# Patient Record
Sex: Male | Born: 1945
Health system: Southern US, Community
[De-identification: ages and names within clinical notes are randomized; demographics above are authoritative.]

## PROBLEM LIST (undated history)

## (undated) DIAGNOSIS — F40240 Claustrophobia: Secondary | ICD-10-CM

## (undated) DIAGNOSIS — M1990 Unspecified osteoarthritis, unspecified site: Secondary | ICD-10-CM

## (undated) DIAGNOSIS — K219 Gastro-esophageal reflux disease without esophagitis: Secondary | ICD-10-CM

## (undated) DIAGNOSIS — U071 COVID-19: Secondary | ICD-10-CM

## (undated) DIAGNOSIS — Q159 Congenital malformation of eye, unspecified: Secondary | ICD-10-CM

## (undated) DIAGNOSIS — M810 Age-related osteoporosis without current pathological fracture: Secondary | ICD-10-CM

## (undated) DIAGNOSIS — E559 Vitamin D deficiency, unspecified: Secondary | ICD-10-CM

## (undated) DIAGNOSIS — K5190 Ulcerative colitis, unspecified, without complications: Secondary | ICD-10-CM

## (undated) DIAGNOSIS — I10 Essential (primary) hypertension: Secondary | ICD-10-CM

## (undated) DIAGNOSIS — C189 Malignant neoplasm of colon, unspecified: Secondary | ICD-10-CM

## (undated) DIAGNOSIS — I499 Cardiac arrhythmia, unspecified: Secondary | ICD-10-CM

## (undated) DIAGNOSIS — D126 Benign neoplasm of colon, unspecified: Secondary | ICD-10-CM

## (undated) DIAGNOSIS — H409 Unspecified glaucoma: Secondary | ICD-10-CM

## (undated) DIAGNOSIS — F1910 Other psychoactive substance abuse, uncomplicated: Secondary | ICD-10-CM

## (undated) HISTORY — DX: Vitamin D deficiency, unspecified: E55.9

## (undated) HISTORY — PX: EYE SURGERY: SHX253

## (undated) HISTORY — PX: INTRAOCULAR LENS INSERTION: SHX110

## (undated) HISTORY — DX: Malignant neoplasm of colon, unspecified: C18.9

## (undated) HISTORY — DX: Other psychoactive substance abuse, uncomplicated: F19.10

## (undated) HISTORY — DX: Unspecified glaucoma: H40.9

## (undated) HISTORY — DX: Unspecified osteoarthritis, unspecified site: M19.90

## (undated) HISTORY — DX: Cardiac arrhythmia, unspecified: I49.9

## (undated) HISTORY — DX: Ulcerative colitis, unspecified, without complications: K51.90

## (undated) HISTORY — DX: Benign neoplasm of colon, unspecified: D12.6

## (undated) HISTORY — PX: VENTRAL HERNIA REPAIR: SHX424

## (undated) HISTORY — DX: Essential (primary) hypertension: I10

## (undated) HISTORY — PX: UPPER GASTROINTESTINAL ENDOSCOPY: SHX188

## (undated) HISTORY — PX: COLONOSCOPY: SHX174

## (undated) HISTORY — DX: Age-related osteoporosis without current pathological fracture: M81.0

---

## 2003-06-14 HISTORY — PX: HERNIA REPAIR: SHX51

## 2013-03-05 ENCOUNTER — Ambulatory Visit (HOSPITAL_COMMUNITY)
Admission: RE | Admit: 2013-03-05 | Discharge: 2013-03-05 | Disposition: A | Discharge status: Home / Self Care | Payer: Medicaid Other | Source: Ambulatory Visit | Attending: Internal Medicine | Admitting: Internal Medicine

## 2013-03-05 ENCOUNTER — Ambulatory Visit: Payer: Medicaid Other | Attending: Internal Medicine | Admitting: Internal Medicine

## 2013-03-05 ENCOUNTER — Encounter: Payer: Self-pay | Admitting: Internal Medicine

## 2013-03-05 VITALS — BP 136/85 | HR 68 | Temp 98.5°F | Resp 16 | Wt 209.0 lb

## 2013-03-05 DIAGNOSIS — K439 Ventral hernia without obstruction or gangrene: Secondary | ICD-10-CM

## 2013-03-05 DIAGNOSIS — IMO0002 Reserved for concepts with insufficient information to code with codable children: Secondary | ICD-10-CM | POA: Insufficient documentation

## 2013-03-05 DIAGNOSIS — M25561 Pain in right knee: Secondary | ICD-10-CM | POA: Insufficient documentation

## 2013-03-05 DIAGNOSIS — M25469 Effusion, unspecified knee: Secondary | ICD-10-CM | POA: Insufficient documentation

## 2013-03-05 DIAGNOSIS — M25569 Pain in unspecified knee: Secondary | ICD-10-CM | POA: Insufficient documentation

## 2013-03-05 DIAGNOSIS — M1710 Unilateral primary osteoarthritis, unspecified knee: Secondary | ICD-10-CM | POA: Insufficient documentation

## 2013-03-05 LAB — LIPID PANEL
Cholesterol: 186 mg/dL (ref 0–200)
HDL: 44 mg/dL
LDL Cholesterol: 114 mg/dL — ABNORMAL HIGH (ref 0–99)
Total CHOL/HDL Ratio: 4.2 ratio
Triglycerides: 142 mg/dL
VLDL: 28 mg/dL (ref 0–40)

## 2013-03-05 LAB — COMPREHENSIVE METABOLIC PANEL
ALT: 18 U/L (ref 0–53)
AST: 16 U/L (ref 0–37)
Albumin: 3.6 g/dL (ref 3.5–5.2)
Alkaline Phosphatase: 73 U/L (ref 39–117)
Calcium: 9.4 mg/dL (ref 8.4–10.5)
Chloride: 103 mEq/L (ref 96–112)
Glucose, Bld: 101 mg/dL — ABNORMAL HIGH (ref 70–99)
Potassium: 3.5 mEq/L (ref 3.5–5.3)
Sodium: 141 mEq/L (ref 135–145)
Total Protein: 7.1 g/dL (ref 6.0–8.3)

## 2013-03-05 LAB — CBC WITH DIFFERENTIAL/PLATELET
Basophils Absolute: 0 10*3/uL (ref 0.0–0.1)
Basophils Relative: 1 % (ref 0–1)
Eosinophils Absolute: 0.2 10*3/uL (ref 0.0–0.7)
Eosinophils Relative: 3 % (ref 0–5)
HCT: 43 % (ref 39.0–52.0)
Hemoglobin: 14.4 g/dL (ref 13.0–17.0)
Lymphocytes Relative: 40 % (ref 12–46)
Lymphs Abs: 2.1 10*3/uL (ref 0.7–4.0)
MCH: 27.8 pg (ref 26.0–34.0)
MCHC: 33.5 g/dL (ref 30.0–36.0)
MCV: 83 fL (ref 78.0–100.0)
Monocytes Absolute: 0.4 10*3/uL (ref 0.1–1.0)
Monocytes Relative: 7 % (ref 3–12)
Neutro Abs: 2.5 10*3/uL (ref 1.7–7.7)
Neutrophils Relative %: 49 % (ref 43–77)
Platelets: 317 10*3/uL (ref 150–400)
RBC: 5.18 MIL/uL (ref 4.22–5.81)
RDW: 14.8 % (ref 11.5–15.5)
WBC: 5.2 10*3/uL (ref 4.0–10.5)

## 2013-03-05 MED ORDER — NAPROXEN 500 MG PO TABS: 500.0000 mg | ORAL_TABLET | Freq: Two times a day (BID) | ORAL | Status: DC

## 2013-03-05 MED ORDER — TRAMADOL HCL 50 MG PO TABS
50.0000 mg | ORAL_TABLET | Freq: Three times a day (TID) | ORAL | Status: DC | PRN
Start: 2013-03-05 — End: 2013-05-06

## 2013-03-05 NOTE — Progress Notes (Signed)
Patient here to establish care Complains of bilateral knee pain Has gotten worse over time

## 2013-03-05 NOTE — Progress Notes (Signed)
Patient ID: Robert Rhodes, male   DOB: 1946-05-21, 67 y.o.   MRN: [id number] Patient Demographics  Robert Rhodes, is a 67 y.o. male  [id number]  [id number]  DOB - [birth year]  Chief Complaint  Patient presents with  . Knee Pain        Subjective:   Robert Rhodes today is here to establish primary care. Patient has No headache, No chest pain, No abdominal pain - No Nausea, No new weakness tingling or numbness, No Cough - SOB Patient has been doing well, with no significant medical problems. He states that his both knees have been hurting, worse on walking, has been using the cane.    Objective:    Filed Vitals:   03/05/13 0952  BP: 136/85  Pulse: 68  Temp: 98.5 F (36.9 C)  Resp: 16  Weight: 209 lb (94.802 kg)  SpO2: 99%     ALLERGIES:   Allergies  Allergen Reactions  . Advil [Ibuprofen]   . Aspirin   . Tylenol [Acetaminophen]     PAST MEDICAL HISTORY: History reviewed. No pertinent past medical history.  PAST SURGICAL HISTORY: Past Surgical History  Procedure Laterality Date  . Hernia repair      FAMILY HISTORY: History reviewed. No pertinent family history.  MEDICATIONS AT HOME: Prior to Admission medications   Medication Sig Start Date End Date Taking? Authorizing Provider  naproxen (NAPROSYN) 500 MG tablet Take 1 tablet (500 mg total) by mouth 2 (two) times daily with a meal. 03/05/13   Bryn Saline Krystal Eaton, MD  traMADol (ULTRAM) 50 MG tablet Take 1 tablet (50 mg total) by mouth every 8 (eight) hours as needed for pain. 03/05/13   Nechuma Boven Krystal Eaton, MD    REVIEW OF SYSTEMS:  Constitutional:   No   Fevers, chills, fatigue.  HEENT:    No headaches, Sore throat,   Cardio-vascular: No chest pain,  Orthopnea, swelling in lower extremities, anasarca, palpitations  GI:  No abdominal pain, nausea, vomiting, diarrhea. Has large ventral hernia, had surgery in 2006, per patient it came right back  Resp: No shortness of breath,  No coughing up of  blood.No cough.No wheezing.  Skin:  no rash or lesions.  GU:  no dysuria, change in color of urine, no urgency or frequency.  No flank pain.  Musculoskeletal:  please see history of present illness  Psych: No change in mood or affect. No depression or anxiety.  No memory loss.   Exam  General appearance :Awake, alert, NAD, Speech Clear. HEENT: Atraumatic and Normocephalic, PERLA Neck: supple, no JVD. No cervical lymphadenopathy.  Chest: clear to auscultation bilaterally, no wheezing, rales or rhonchi CVS: S1 S2 regular, no murmurs.  Abdomen: soft, NBS, NT, ND, no gaurding, rigidity or rebound.Large ventral hernia  Extremities: No cyanosis, clubbing, B/L Lower Ext shows no edema, , No joint effusions  Neurology: Awake alert, and oriented X 3, CN II-XII intact, Non focal Skin:No Rash or lesions Wounds: N/A    Data Review   Basic Metabolic Panel: No results found for this basename: NA, K, CL, CO2, GLUCOSE, BUN, CREATININE, CALCIUM, MG, PHOS,  in the last 168 hours Liver Function Tests: No results found for this basename: AST, ALT, ALKPHOS, BILITOT, PROT, ALBUMIN,  in the last 168 hours  CBC: No results found for this basename: WBC, NEUTROABS, HGB, HCT, MCV, PLT,  in the last 168 hours ------------------------------------------------------------------------------------------------------------------ No results found for this basename: HGBA1C,  in the last 72 hours ------------------------------------------------------------------------------------------------------------------ No results found for this basename:  CHOL, HDL, LDLCALC, TRIG, CHOLHDL, LDLDIRECT,  in the last 72 hours ------------------------------------------------------------------------------------------------------------------ No results found for this basename: TSH, T4TOTAL, FREET3, T3FREE, THYROIDAB,  in the last 72  hours ------------------------------------------------------------------------------------------------------------------ No results found for this basename: VITAMINB12, FOLATE, FERRITIN, TIBC, IRON, RETICCTPCT,  in the last 72 hours  Coagulation profile  No results found for this basename: INR, PROTIME,  in the last 168 hours    Assessment & Plan   Active Problems: Bilateral knee pain: Likely osteoarthritis - Check a vitamin D level, CMET, bilateral knee x-rays - Placed on Naprosyn BID and tramadol PRN   Ventral hernia: Patient had a surgery done in 2006, per patient it came right back up, he does not want any surgical evaluation. Explained to the patient that if at all with any significant pain in the abdomen, to come to the ER to rule out any incarcerated hernia or obstruction.  Health screening - Check CBC, CMET, lipid panel, vitamin D. level, PSA level - He declined a flu shot - States that he did have a screening colonoscopy at the age of 51, which was normal and was told that he needs repeat colonoscopy in 10 years  Recommendations:Follow labs, imaging  Follow-up in 4 weeks   Weda Baumgarner M.D. 03/05/2013, 10:12 AM

## 2013-03-06 LAB — VITAMIN D 25 HYDROXY (VIT D DEFICIENCY, FRACTURES): Vit D, 25-Hydroxy: 19 ng/mL — ABNORMAL LOW (ref 30–89)

## 2013-03-29 ENCOUNTER — Telehealth: Payer: Self-pay | Admitting: Internal Medicine

## 2013-03-29 NOTE — Telephone Encounter (Signed)
Pt says colitis started flaring last month and wondering about effective medications.  Previously was taking a medication he says did not do much to alleviate symptoms.  Pt has follow-up on 04/04/13.

## 2013-04-04 ENCOUNTER — Encounter: Payer: Self-pay | Admitting: Internal Medicine

## 2013-04-04 ENCOUNTER — Ambulatory Visit: Payer: Medicaid Other | Attending: Internal Medicine | Admitting: Internal Medicine

## 2013-04-04 VITALS — BP 154/84 | HR 77 | Temp 98.2°F | Ht 69.0 in | Wt 209.6 lb

## 2013-04-04 DIAGNOSIS — K5190 Ulcerative colitis, unspecified, without complications: Secondary | ICD-10-CM | POA: Insufficient documentation

## 2013-04-04 MED ORDER — SULFASALAZINE 500 MG PO TABS: 500.0000 mg | ORAL_TABLET | Freq: Four times a day (QID) | ORAL | Status: DC

## 2013-04-04 NOTE — Progress Notes (Signed)
Patient ID: Robert Rhodes, male   DOB: 1946-03-24, 67 y.o.   MRN: [id number] Patient Demographics  Robert Rhodes, is a 67 y.o. male  [id number]  [id number]  DOB - 10/18/1945  Chief Complaint  Patient presents with  . Results  . Ulcerative Colitis        Subjective:   Robert Rhodes is a 67 y.o. male here today for a follow up visit. Patient has history of ulcerative colitis and has been on sulfasalazine for about 20 years, he said he relocated from [place] 2 years ago and has run out of this medication, he has been doing well until recently when his colitis started some flaring again. He claims now there is no abdominal pain, but he has started seeing some blood in stool as well as bloating, and he is gassy. He wonders if he can restart sulfasalazine. He claims he knows how to control the gas by using peppermint, he also knows how to control the cramp with fibers, but his level to control the blood in stool. Patient stopped smoking 20 years ago Patient has No headache, No chest pain, No abdominal pain - No Nausea, No new weakness tingling or numbness, No Cough - SOB.  ALLERGIES: Allergies  Allergen Reactions  . Advil [Ibuprofen]   . Aspirin   . Tylenol [Acetaminophen]     PAST MEDICAL HISTORY: No past medical history on file.  MEDICATIONS AT HOME: Prior to Admission medications   Medication Sig Start Date End Date Taking? Authorizing Provider  naproxen (NAPROSYN) 500 MG tablet Take 1 tablet (500 mg total) by mouth 2 (two) times daily with a meal. 03/05/13  Yes Ripudeep K Rai, MD  traMADol (ULTRAM) 50 MG tablet Take 1 tablet (50 mg total) by mouth every 8 (eight) hours as needed for pain. 03/05/13  Yes Ripudeep Krystal Eaton, MD  sulfaSALAzine (AZULFIDINE) 500 MG tablet Take 1 tablet (500 mg total) by mouth 4 (four) times daily. 04/04/13   Angelica Chessman, MD     Objective:   Filed Vitals:   04/04/13 0955  BP: 154/84  Pulse: 77  Temp: 98.2 F (36.8 C)  TempSrc:  Oral  Height: 5' 9"  (1.753 m)  Weight: 209 lb 9.6 oz (95.074 kg)  SpO2: 99%    Exam General appearance : Awake, alert, not in any distress. Speech Clear. Not toxic looking HEENT: Atraumatic and Normocephalic, pupils equally reactive to light and accomodation Neck: supple, no JVD. No cervical lymphadenopathy.  Chest:Good air entry bilaterally, no added sounds  CVS: S1 S2 regular, no murmurs.  Abdomen: Large ventral hernia, Bowel sounds present, Non tender and not distended with no gaurding, rigidity or rebound. Extremities: B/L Lower Ext shows no edema, both legs are warm to touch Neurology: Awake alert, and oriented X 3, CN II-XII intact, Non focal Skin:No Rash Wounds:N/A   Data Review   CBC No results found for this basename: WBC, HGB, HCT, PLT, MCV, MCH, MCHC, RDW, NEUTRABS, LYMPHSABS, MONOABS, EOSABS, BASOSABS, BANDABS, BANDSABD,  in the last 168 hours  Chemistries   No results found for this basename: NA, K, CL, CO2, GLUCOSE, BUN, CREATININE, GFRCGP, CALCIUM, MG, AST, ALT, ALKPHOS, BILITOT,  in the last 168 hours ------------------------------------------------------------------------------------------------------------------ No results found for this basename: HGBA1C,  in the last 72 hours ------------------------------------------------------------------------------------------------------------------ No results found for this basename: CHOL, HDL, LDLCALC, TRIG, CHOLHDL, LDLDIRECT,  in the last 72 hours ------------------------------------------------------------------------------------------------------------------ No results found for this basename: TSH, T4TOTAL, FREET3, T3FREE, THYROIDAB,  in the last 72 hours ------------------------------------------------------------------------------------------------------------------  No results found for this basename: VITAMINB12, FOLATE, FERRITIN, TIBC, IRON, RETICCTPCT,  in the last 72 hours  Coagulation profile  No results  found for this basename: INR, PROTIME,  in the last 168 hours    Assessment & Plan   Patient Active Problem List   Diagnosis Date Noted  . Ulcerative colitis, unspecified 04/04/2013  . Ventral hernia 03/05/2013  . Knee pain, bilateral 03/05/2013     Plan: Sulfasalazine 500 mg tablet by mouth 4 times a day Ambulatory referral to GI specialist today for colonoscopy and further workup of ulcerative colitis Patient has been extensively counseled about nutrition and exercise   Health Maintenance -Colonoscopy: Ordered today  -Vaccinations:  -Influenza declined by patient  Follow up in 3 months when necessary  The patient was given clear instructions to go to ER or return to medical center if symptoms don't improve, worsen or new problems develop. The patient verbalized understanding. The patient was told to call to get lab results if they haven't heard anything in the next week.    Angelica Chessman, MD, [place], [place], [place] and [place], [place]   04/04/2013, 10:31 AM

## 2013-04-04 NOTE — Patient Instructions (Signed)
Ulcerative Colitis Ulcerative colitis is a long lasting swelling and soreness (inflammation) of the colon (large intestine). In patients with ulcerative colitis, sores (ulcers) and inflammation of the inner lining of the colon lead to illness. Ulcerative colitis can also cause problems outside the digestive tract.  Ulcerative colitis is closely related to another condition of inflammation of the intestines called Crohn's disease. Together, they are frequently referred to as inflammatory bowel disease (IBD). Ulcerative colitis and Crohn's diseases are conditions that can last years to decades. Men and women are affected equally. They most commonly begin during adolescence and early adulthood. SYMPTOMS  Common symptoms of ulcerative colitis include rectal bleeding and diarrhea. There is a wide range of symptoms among patients with this disease depending on how severe the disease is. Some of these symptoms are:  Abdominal pain or cramping.  Diarrhea.  Fever.  Tiredness (fatigue).  Weight loss.  Night sweats.  Rectal pain.  Feeling the immediate need to have a bowel movement (rectal urgency). CAUSES  Ulcerative colitis is caused by increased activity of the immune system in the intestines. The immune system is the system that protects the body against disease such as harmful bacteria, viruses, fungi, and other foreign invaders. When the immune system overacts, it causes inflammation. The cause of the increased immune system activity is not known. This over activity causes long-lasting inflammation and ulceration. This condition may be passed down from your parents (inherited). Brothers, sisters, children, and parents of patients with IBD are more likely to develop these diseases. It is not contagious. This means you cannot catch it from someone else. DIAGNOSIS  Your caregiver may suspect ulcerative colitis based on your symptoms and exam. Blood tests may confirm that there is a problem. You may  be asked to submit a stool specimen for examination. X-rays and CT scans may be necessary. Ultimately, the diagnosis is usually made after a flexible tube is inserted via your anus and your colon is examined under sedation (colonoscopy). With this test, the specialist can take a tiny tissue sample from inside the bowel (biopsy). Examination of this biopsy tissue under a microscopy can reveal ulcerative colitis as the cause of your symptoms. TREATMENT   There is no cure for ulcerative colitis.  Complications such as massive bleeding from the colon (hemorrhage), development of a hole in the colon (perforation), or the development of precancerous or cancerous changes of the colon may require surgery.  Medications are often used to decrease inflammation and control the immune system. These include medicines related to aspirin, steroid medications, and newer and stronger medications to slow down the immune system. Some medications may be used as suppositories or enemas. A number of other medications are used or have been studied. Your caregiver will make specific recommendations. HOME CARE INSTRUCTIONS   There is no cure for ulcerative colitis disease. The best treatment is frequent checkups with your caregiver. Periodic reevaluation is important.  Symptoms such as diarrhea can be controlled with medications. Avoid foods that have a laxative effect such fresh fruit and vegetables and dairy products. During flare ups, you can rest your bowel by staying away from solid foods. Drink clear liquids frequently during the day. Electrolyte or rehydrating fluids are best. Your caregiver can help you with suggestions. Drink often to prevent dehydration. When diarrhea has cleared, eat smaller meals and more often. Avoid food additives and stimulants such as caffeine (coffee, tea, many sodas, or chocolate). Avoid dairy products. Enzyme supplements may help if you develop intolerance to  a sugar in dairy products  (lactose). Ask your caregiver or dietitian about specific dietary instructions.  If you had surgery, be sure you understand your care instructions thoroughly, including proper care of any surgical wounds.  Take any medications exactly as prescribed.  Try to maintain a positive attitude. Learn relaxation techniques such as self hypnosis, mental imaging, and muscle relaxation. If possible, avoid stresses that aggravate your condition. Exercise regularly. Follow your diet. Always get plenty of rest. SEEK MEDICAL CARE IF:   Your symptoms fail to improve after a week or two of new treatment.  You experience continued weight loss.  You have ongoing crampy digestion or loose bowels.  You develop a new skin rash, skin sores, or eye problems. SEEK IMMEDIATE MEDICAL CARE IF:   You have worsening of your symptoms or develop new symptoms.  You have an oral temperature above 102 F (38.9 C), not controlled by medicine.  You develop bloody diarrhea.  You have severe abdominal pain. Document Released: 03/09/2005 Document Revised: 08/22/2011 Document Reviewed: 02/06/2007 [facility] Patient Information 2014 [place], [place].

## 2013-05-06 ENCOUNTER — Encounter: Payer: Self-pay | Admitting: Internal Medicine

## 2013-05-06 ENCOUNTER — Ambulatory Visit: Payer: Medicaid Other | Attending: Internal Medicine | Admitting: Internal Medicine

## 2013-05-06 VITALS — BP 121/73 | HR 85 | Temp 98.9°F | Resp 16 | Ht 69.0 in | Wt 216.0 lb

## 2013-05-06 DIAGNOSIS — M25561 Pain in right knee: Secondary | ICD-10-CM

## 2013-05-06 DIAGNOSIS — E569 Vitamin deficiency, unspecified: Secondary | ICD-10-CM | POA: Insufficient documentation

## 2013-05-06 DIAGNOSIS — M25569 Pain in unspecified knee: Secondary | ICD-10-CM

## 2013-05-06 DIAGNOSIS — K5190 Ulcerative colitis, unspecified, without complications: Secondary | ICD-10-CM

## 2013-05-06 DIAGNOSIS — E559 Vitamin D deficiency, unspecified: Secondary | ICD-10-CM

## 2013-05-06 MED ORDER — TRAMADOL HCL 50 MG PO TABS: 50.0000 mg | ORAL_TABLET | Freq: Three times a day (TID) | ORAL | Status: DC | PRN

## 2013-05-06 MED ORDER — SULFASALAZINE 500 MG PO TABS: 500.0000 mg | ORAL_TABLET | Freq: Four times a day (QID) | ORAL | Status: DC

## 2013-05-06 MED ORDER — VITAMIN D (ERGOCALCIFEROL) 1.25 MG (50000 UNIT) PO CAPS: 50000.0000 [IU] | ORAL_CAPSULE | ORAL | Status: DC

## 2013-05-06 NOTE — Progress Notes (Signed)
Patient ID: Robert Rhodes, male   DOB: 07-05-1945, 67 y.o.   MRN: [id number] Patient Demographics  Robert Rhodes, is a 67 y.o. male  [id number]  [id number]  DOB - 1946/01/28  Chief Complaint  Patient presents with  . Follow-up        Subjective:   Robert Rhodes is a 67 y.o. male here today for a follow up visit. Patient is known to have ulcerative colitis on sulfasalazine. He has no major complaint today, he says his knee pain is better. No bloody stool, no abdominal pain. He is yet to see the gastroenterologist. He is here for the referral. Patient has No headache, No chest pain, No abdominal pain - No Nausea, No new weakness tingling or numbness, No Cough - SOB.  ALLERGIES: Allergies  Allergen Reactions  . Advil [Ibuprofen]   . Aspirin   . Tylenol [Acetaminophen]     PAST MEDICAL HISTORY: History reviewed. No pertinent past medical history.  MEDICATIONS AT HOME: Prior to Admission medications   Medication Sig Start Date End Date Taking? Authorizing Provider  naproxen (NAPROSYN) 500 MG tablet Take 1 tablet (500 mg total) by mouth 2 (two) times daily with a meal. 03/05/13  Yes Ripudeep K Rai, MD  sulfaSALAzine (AZULFIDINE) 500 MG tablet Take 1 tablet (500 mg total) by mouth 4 (four) times daily. 05/06/13  Yes Angelica Chessman, MD  traMADol (ULTRAM) 50 MG tablet Take 1 tablet (50 mg total) by mouth every 8 (eight) hours as needed. 05/06/13  Yes Angelica Chessman, MD  Vitamin D, Ergocalciferol, (DRISDOL) 50000 UNITS CAPS capsule Take 1 capsule (50,000 Units total) by mouth every 7 (seven) days. 05/06/13   Angelica Chessman, MD     Objective:   Filed Vitals:   05/06/13 0954  BP: 121/73  Pulse: 85  Temp: 98.9 F (37.2 C)  TempSrc: Oral  Resp: 16  Height: 5' 9"  (1.753 m)  Weight: 216 lb (97.977 kg)  SpO2: 99%    Exam General appearance : Awake, alert, not in any distress. Speech Clear. Not toxic looking HEENT: Atraumatic and Normocephalic, pupils equally  reactive to light and accomodation Neck: supple, no JVD. No cervical lymphadenopathy.  Chest:Good air entry bilaterally, no added sounds  CVS: S1 S2 regular, no murmurs.  Abdomen: Huge umbilical hernia. Bowel sounds present, Non tender and not distended with no gaurding, rigidity or rebound. Extremities: B/L Lower Ext shows no edema, both legs are warm to touch Neurology: Awake alert, and oriented X 3, CN II-XII intact, Non focal Skin:No Rash Wounds:N/A   Data Review   CBC No results found for this basename: WBC, HGB, HCT, PLT, MCV, MCH, MCHC, RDW, NEUTRABS, LYMPHSABS, MONOABS, EOSABS, BASOSABS, BANDABS, BANDSABD,  in the last 168 hours  Chemistries   No results found for this basename: NA, K, CL, CO2, GLUCOSE, BUN, CREATININE, GFRCGP, CALCIUM, MG, AST, ALT, ALKPHOS, BILITOT,  in the last 168 hours ------------------------------------------------------------------------------------------------------------------ No results found for this basename: HGBA1C,  in the last 72 hours ------------------------------------------------------------------------------------------------------------------ No results found for this basename: CHOL, HDL, LDLCALC, TRIG, CHOLHDL, LDLDIRECT,  in the last 72 hours ------------------------------------------------------------------------------------------------------------------ No results found for this basename: TSH, T4TOTAL, FREET3, T3FREE, THYROIDAB,  in the last 72 hours ------------------------------------------------------------------------------------------------------------------ No results found for this basename: VITAMINB12, FOLATE, FERRITIN, TIBC, IRON, RETICCTPCT,  in the last 72 hours  Coagulation profile  No results found for this basename: INR, PROTIME,  in the last 168 hours    Assessment & Plan   1. Ulcerative colitis, unspecified Continue - sulfaSALAzine (  AZULFIDINE) 500 MG tablet; Take 1 tablet (500 mg total) by mouth 4 (four) times  daily.  Dispense: 120 tablet; Refill: 3 - Ambulatory referral to Gastroenterology for colonoscopy  2. Hypovitaminosis D Start - Vitamin D, Ergocalciferol, (DRISDOL) 50000 UNITS CAPS capsule; Take 1 capsule (50,000 Units total) by mouth every 7 (seven) days.  Dispense: 8 capsule; Refill: 1  3. Knee pain, bilateral  - traMADol (ULTRAM) 50 MG tablet; Take 1 tablet (50 mg total) by mouth every 8 (eight) hours as needed.  Dispense: 60 tablet; Refill: 1  Follow up in 3 months or when necessary  The patient was given clear instructions to go to ER or return to medical center if symptoms don't improve, worsen or new problems develop. The patient verbalized understanding. The patient was told to call to get lab results if they haven't heard anything in the next week.    Angelica Chessman, MD, [place], [place], [place] and [place], [place]   05/06/2013, 10:38 AM

## 2013-05-06 NOTE — Progress Notes (Signed)
Pt is here following up on his knee pain/osteoarthritis. The medication is adequately controlling the knee pain.

## 2013-05-06 NOTE — Patient Instructions (Signed)
Ulcerative Colitis Ulcerative colitis is a long lasting swelling and soreness (inflammation) of the colon (large intestine). In patients with ulcerative colitis, sores (ulcers) and inflammation of the inner lining of the colon lead to illness. Ulcerative colitis can also cause problems outside the digestive tract.  Ulcerative colitis is closely related to another condition of inflammation of the intestines called Crohn's disease. Together, they are frequently referred to as inflammatory bowel disease (IBD). Ulcerative colitis and Crohn's diseases are conditions that can last years to decades. Men and women are affected equally. They most commonly begin during adolescence and early adulthood. SYMPTOMS  Common symptoms of ulcerative colitis include rectal bleeding and diarrhea. There is a wide range of symptoms among patients with this disease depending on how severe the disease is. Some of these symptoms are:  Abdominal pain or cramping.  Diarrhea.  Fever.  Tiredness (fatigue).  Weight loss.  Night sweats.  Rectal pain.  Feeling the immediate need to have a bowel movement (rectal urgency). CAUSES  Ulcerative colitis is caused by increased activity of the immune system in the intestines. The immune system is the system that protects the body against disease such as harmful bacteria, viruses, fungi, and other foreign invaders. When the immune system overacts, it causes inflammation. The cause of the increased immune system activity is not known. This over activity causes long-lasting inflammation and ulceration. This condition may be passed down from your parents (inherited). Brothers, sisters, children, and parents of patients with IBD are more likely to develop these diseases. It is not contagious. This means you cannot catch it from someone else. DIAGNOSIS  Your caregiver may suspect ulcerative colitis based on your symptoms and exam. Blood tests may confirm that there is a problem. You may  be asked to submit a stool specimen for examination. X-rays and CT scans may be necessary. Ultimately, the diagnosis is usually made after a flexible tube is inserted via your anus and your colon is examined under sedation (colonoscopy). With this test, the specialist can take a tiny tissue sample from inside the bowel (biopsy). Examination of this biopsy tissue under a microscopy can reveal ulcerative colitis as the cause of your symptoms. TREATMENT   There is no cure for ulcerative colitis.  Complications such as massive bleeding from the colon (hemorrhage), development of a hole in the colon (perforation), or the development of precancerous or cancerous changes of the colon may require surgery.  Medications are often used to decrease inflammation and control the immune system. These include medicines related to aspirin, steroid medications, and newer and stronger medications to slow down the immune system. Some medications may be used as suppositories or enemas. A number of other medications are used or have been studied. Your caregiver will make specific recommendations. HOME CARE INSTRUCTIONS   There is no cure for ulcerative colitis disease. The best treatment is frequent checkups with your caregiver. Periodic reevaluation is important.  Symptoms such as diarrhea can be controlled with medications. Avoid foods that have a laxative effect such fresh fruit and vegetables and dairy products. During flare ups, you can rest your bowel by staying away from solid foods. Drink clear liquids frequently during the day. Electrolyte or rehydrating fluids are best. Your caregiver can help you with suggestions. Drink often to prevent dehydration. When diarrhea has cleared, eat smaller meals and more often. Avoid food additives and stimulants such as caffeine (coffee, tea, many sodas, or chocolate). Avoid dairy products. Enzyme supplements may help if you develop intolerance to  a sugar in dairy products  (lactose). Ask your caregiver or dietitian about specific dietary instructions.  If you had surgery, be sure you understand your care instructions thoroughly, including proper care of any surgical wounds.  Take any medications exactly as prescribed.  Try to maintain a positive attitude. Learn relaxation techniques such as self hypnosis, mental imaging, and muscle relaxation. If possible, avoid stresses that aggravate your condition. Exercise regularly. Follow your diet. Always get plenty of rest. SEEK MEDICAL CARE IF:   Your symptoms fail to improve after a week or two of new treatment.  You experience continued weight loss.  You have ongoing crampy digestion or loose bowels.  You develop a new skin rash, skin sores, or eye problems. SEEK IMMEDIATE MEDICAL CARE IF:   You have worsening of your symptoms or develop new symptoms.  You have an oral temperature above 102 F (38.9 C), not controlled by medicine.  You develop bloody diarrhea.  You have severe abdominal pain. Document Released: 03/09/2005 Document Revised: 08/22/2011 Document Reviewed: 02/06/2007 [facility] Patient Information 2014 [place], [place].

## 2013-05-21 ENCOUNTER — Encounter: Payer: Self-pay | Admitting: Internal Medicine

## 2013-06-13 DIAGNOSIS — C189 Malignant neoplasm of colon, unspecified: Secondary | ICD-10-CM

## 2013-06-13 HISTORY — DX: Malignant neoplasm of colon, unspecified: C18.9

## 2013-06-20 ENCOUNTER — Ambulatory Visit (INDEPENDENT_AMBULATORY_CARE_PROVIDER_SITE_OTHER): Payer: Medicare FFS | Admitting: Internal Medicine

## 2013-06-20 ENCOUNTER — Encounter: Payer: Self-pay | Admitting: Internal Medicine

## 2013-06-20 VITALS — BP 148/84 | HR 80 | Ht 66.14 in | Wt 222.5 lb

## 2013-06-20 DIAGNOSIS — K5190 Ulcerative colitis, unspecified, without complications: Secondary | ICD-10-CM

## 2013-06-20 DIAGNOSIS — K3189 Other diseases of stomach and duodenum: Secondary | ICD-10-CM

## 2013-06-20 DIAGNOSIS — R1013 Epigastric pain: Secondary | ICD-10-CM

## 2013-06-20 DIAGNOSIS — K30 Functional dyspepsia: Secondary | ICD-10-CM

## 2013-06-20 MED ORDER — MOVIPREP 100 G PO SOLR: ORAL | Status: DC

## 2013-06-20 MED ORDER — FAMOTIDINE 20 MG PO TABS: 20.0000 mg | ORAL_TABLET | Freq: Two times a day (BID) | ORAL | Status: DC

## 2013-06-20 NOTE — Patient Instructions (Addendum)
You have been scheduled for a colonoscopy with propofol. Please follow written instructions given to you at your visit today.  Please pick up your prep kit at the pharmacy within the next 1-3 days. If you use inhalers (even only as needed), please bring them with you on the day of your procedure. Your physician has requested that you go to www.startemmi.com and enter the access code given to you at your visit today. This web site gives a general overview about your procedure. However, you should still follow specific instructions given to you by our office regarding your preparation for the procedure.  Continue taking sulfaSALAzine (AZULFIDINE) 500 MG tablet as you are  We also sent in pepcid to your pharmacy take as directed.

## 2013-06-20 NOTE — Progress Notes (Signed)
Patient ID: Robert Rhodes, male   DOB: May 12, 1946, 68 y.o.   MRN: [id number] HPI: Robert Rhodes is a 68 year old male with a past medical history of ulcerative colitis and vitamin D deficiency he was seen in consultation at the request of Dr. Doreene Burke to establish care regarding ulcerative colitis and after a recent flare. He is here alone today. He reports he was diagnosed with ulcerative colitis in approximately 1992. He feels his last colonoscopy was about 10 years ago. His previous GI doctor was in [place].  He has not seen a GI doctor since moving to this area in July 2013. He reports having intermittent flares of his colitis over the last several decades. This occurs approximately every 2-3 years. He reports he developed a flare in October 2014 which lasted about 6-8 weeks. This was slightly longer than his previous flares and he reports he has been able to abort most flares with reinitiation of sulfasalazine. He is currently taking sulfasalazine 500 mg 4 times daily. He reports he usually will start back on the sulfasalazine when his flare symptoms began. Flares for him involved regularity to his bowel movements and eventually loose bloody stools. He reports he has never had much abdominal pain but reports lower abdominal cramping associated with his diarrhea. He does endorse tenesmus. He reports his flare has resolved completely in his bowel habits at this point to return back to normal which is 1-2 formed stools daily. No blood in his stool or melena. He reports a good appetite. He eats a vegetarian diet. He does report occasional indigestion but no dysphagia or diet dysphagia. No weight loss. Early satiety. No rashes, new joint pains, eye complaints or oral ulcers.  No known family history of IBD  He does use raspberry fiber bland and peppermint oil which helps him with constipation and bowel regularity. When he is not having flares of colitis he can become constipated.  I inquired about  previous treatments for active colitis. He does not recall ever being treated with prednisone or other steroids. He also denies a history of colon polyps.  He has a history of the large ventral hernia, status post repair which has failed. He reports some occasional soreness in his large ventral hernia. He has tried an abdominal binder but reports this is too tight it makes it difficult for him to breathe  Past Medical History  Diagnosis Date  . Vitamin D deficiency   . UC (ulcerative colitis)     Past Surgical History  Procedure Laterality Date  . Ventral hernia repair      Current Outpatient Prescriptions  Medication Sig Dispense Refill  . AMBULATORY NON FORMULARY MEDICATION Slippery elm 1 tablet by mouth once daily      . AMBULATORY NON FORMULARY MEDICATION Peppermint Oil One capsule by mouth three times a day as needed      . AMBULATORY NON FORMULARY MEDICATION Raspberry fiber blend Three tsp daily      . AMBULATORY NON FORMULARY MEDICATION Artichoke  Take 1 capsule by mouth daily      . naproxen (NAPROSYN) 500 MG tablet Take 1 tablet (500 mg total) by mouth 2 (two) times daily with a meal.  60 tablet  1  . sulfaSALAzine (AZULFIDINE) 500 MG tablet Take 1 tablet (500 mg total) by mouth 4 (four) times daily.  120 tablet  3  . traMADol (ULTRAM) 50 MG tablet Take 1 tablet (50 mg total) by mouth every 8 (eight) hours as needed.  60 tablet  1  . vitamin E 100 UNIT capsule Take 100 Units by mouth daily.      . famotidine (PEPCID) 20 MG tablet Take 1 tablet (20 mg total) by mouth 2 (two) times daily.  60 tablet  3  . MOVIPREP 100 G SOLR Use per prep instruction  1 kit  0   No current facility-administered medications for this visit.    Allergies  Allergen Reactions  . Advil [Ibuprofen]   . Aspirin   . Tylenol [Acetaminophen]     Family History  Problem Relation Age of Onset  . Cancer Mother     type unknown  . Diabetes Father   . Heart disease Father   . Heart disease  Sister   . Heart attack Brother     History  Substance Use Topics  . Smoking status: Former Smoker    Types: Cigarettes    Quit date: 06/14/2003  . Smokeless tobacco: Never Used     Comment: quit in July 2005  . Alcohol Use: No     Comment: quit in 2005    ROS: As per history of present illness, otherwise negative  BP 148/84  Pulse 80  Ht 5' 6.14" (1.68 m)  Wt 222 lb 8 oz (100.925 kg)  BMI 35.76 kg/m2 Constitutional: Well-developed and well-nourished. No distress. HEENT: Normocephalic and atraumatic. Oropharynx is clear and moist. No oropharyngeal exudate. Conjunctivae are normal.  No scleral icterus. Neck: Neck supple. Trachea midline. Cardiovascular: Normal rate, regular rhythm and intact distal pulses. No M/R/G Pulmonary/chest: Effort normal and breath sounds normal. No wheezing, rales or rhonchi. Abdominal: Soft,  large ventral hernia without tenderness, no other distended. Bowel sounds active throughout.  Extremities: no clubbing, cyanosis, or edema Neurological: Alert and oriented to person place and time. Skin: Skin is warm and dry. No rashes noted. Psychiatric: Normal mood and affect. Behavior is normal.  RELEVANT LABS AND IMAGING: CBC    Component Value Date/Time   WBC 5.2 03/05/2013 1021   RBC 5.18 03/05/2013 1021   HGB 14.4 03/05/2013 1021   HCT 43.0 03/05/2013 1021   PLT 317 03/05/2013 1021   MCV 83.0 03/05/2013 1021   MCH 27.8 03/05/2013 1021   MCHC 33.5 03/05/2013 1021   RDW 14.8 03/05/2013 1021   LYMPHSABS 2.1 03/05/2013 1021   MONOABS 0.4 03/05/2013 1021   EOSABS 0.2 03/05/2013 1021   BASOSABS 0.0 03/05/2013 1021    CMP     Component Value Date/Time   NA 141 03/05/2013 1021   K 3.5 03/05/2013 1021   CL 103 03/05/2013 1021   CO2 33* 03/05/2013 1021   GLUCOSE 101* 03/05/2013 1021   BUN 10 03/05/2013 1021   CREATININE 0.90 03/05/2013 1021   CALCIUM 9.4 03/05/2013 1021   PROT 7.1 03/05/2013 1021   ALBUMIN 3.6 03/05/2013 1021   AST 16 03/05/2013 1021   ALT 18  03/05/2013 1021   ALKPHOS 73 03/05/2013 1021   BILITOT 0.5 03/05/2013 1021    ASSESSMENT/PLAN:  68 year old male with a past medical history of ulcerative colitis and vitamin D deficiency he was seen in consultation at the request of Dr. Doreene Burke to establish care regarding ulcerative colitis and after a recent flare.  1.  Ulcerative colitis -- the patient has what sounds like long-standing ulcerative colitis though I do not have any previous records at this time. The patient cannot remember the name of his exact GI practice but we will try to obtain records if possible. I have recommended repeat colonoscopy  at this time to determine the activity of his colitis and also to perform surveillance biopsies. We discussed current guidelines would support surveillance every one to 2 years with 4 quadrant biopsies every 10 cm. He was certainly meet criteria for surveillance at this time given his colitis of greater than 20 years.  For now I will have him continue sulfasalazine 2 g total daily.  We discussed that this may be a permanent medication for him to try to abort future attacks and maintain remission.  Further recommendations will be able to be made once his colonoscopy has been completed. We discussed colonoscopy including the risks and benefits and he is agreeable to proceed  2.  Indigestion -- mild and occasional without alarm symptoms. I recommended famotidine 20 mg twice daily as needed. He is asked to notify me if this fails to help her symptoms progress in any way. He voices understanding

## 2013-06-25 ENCOUNTER — Ambulatory Visit (AMBULATORY_SURGERY_CENTER): Payer: Commercial Managed Care - HMO | Admitting: Internal Medicine

## 2013-06-25 ENCOUNTER — Encounter: Payer: Self-pay | Admitting: Internal Medicine

## 2013-06-25 ENCOUNTER — Other Ambulatory Visit (INDEPENDENT_AMBULATORY_CARE_PROVIDER_SITE_OTHER): Payer: Medicare FFS

## 2013-06-25 ENCOUNTER — Telehealth: Payer: Self-pay | Admitting: *Deleted

## 2013-06-25 VITALS — BP 156/75 | HR 80 | Resp 16

## 2013-06-25 DIAGNOSIS — K6389 Other specified diseases of intestine: Secondary | ICD-10-CM

## 2013-06-25 DIAGNOSIS — D126 Benign neoplasm of colon, unspecified: Secondary | ICD-10-CM

## 2013-06-25 DIAGNOSIS — K5190 Ulcerative colitis, unspecified, without complications: Secondary | ICD-10-CM

## 2013-06-25 DIAGNOSIS — K639 Disease of intestine, unspecified: Secondary | ICD-10-CM

## 2013-06-25 DIAGNOSIS — C180 Malignant neoplasm of cecum: Secondary | ICD-10-CM

## 2013-06-25 LAB — CREATININE, SERUM: CREATININE: 1 mg/dL (ref 0.4–1.5)

## 2013-06-25 LAB — BUN: BUN: 6 mg/dL (ref 6–23)

## 2013-06-25 MED ORDER — SODIUM CHLORIDE 0.9 % IV SOLN: 500.0000 mL | INTRAVENOUS | Status: DC

## 2013-06-25 NOTE — Patient Instructions (Signed)
YOU HAD AN ENDOSCOPIC PROCEDURE TODAY AT [facility]: Refer to the procedure report that was given to you for any specific questions about what was found during the examination.  If the procedure report does not answer your questions, please call your gastroenterologist to clarify.  If you requested that your care partner not be given the details of your procedure findings, then the procedure report has been included in a sealed envelope for you to review at your convenience later.  YOU SHOULD EXPECT: Some feelings of bloating in the abdomen. Passage of more gas than usual.  Walking can help get rid of the air that was put into your GI tract during the procedure and reduce the bloating. If you had a lower endoscopy (such as a colonoscopy or flexible sigmoidoscopy) you may notice spotting of blood in your stool or on the toilet paper. If you underwent a bowel prep for your procedure, then you may not have a normal bowel movement for a few days.  DIET: Your first meal following the procedure should be a light meal and then it is ok to progress to your normal diet.  A half-sandwich or bowl of soup is an example of a good first meal.  Heavy or fried foods are harder to digest and may make you feel nauseous or bloated.  Likewise meals heavy in dairy and vegetables can cause extra gas to form and this can also increase the bloating.  Drink plenty of fluids but you should avoid alcoholic beverages for 24 hours.  ACTIVITY: Your care partner should take you home directly after the procedure.  You should plan to take it easy, moving slowly for the rest of the day.  You can resume normal activity the day after the procedure however you should NOT DRIVE or use heavy machinery for 24 hours (because of the sedation medicines used during the test).    SYMPTOMS TO REPORT IMMEDIATELY: A gastroenterologist can be reached at any hour.  During normal business hours, 8:30 AM to 5:00 PM Monday through Friday,  call [phone].  After hours and on weekends, please call the GI answering service at [phone] who will take a message and have the physician on call contact you.   Following lower endoscopy (colonoscopy or flexible sigmoidoscopy):  Excessive amounts of blood in the stool  Significant tenderness or worsening of abdominal pains  Swelling of the abdomen that is new, acute  Fever of 100F or higher  FOLLOW UP: If any biopsies were taken you will be contacted by phone or by letter within the next 1-3 weeks.  Call your gastroenterologist if you have not heard about the biopsies in 3 weeks.  Our staff will call the home number listed on your records the next business day following your procedure to check on you and address any questions or concerns that you may have at that time regarding the information given to you following your procedure. This is a courtesy call and so if there is no answer at the home number and we have not heard from you through the emergency physician on call, we will assume that you have returned to your regular daily activities without incident.  SIGNATURES/CONFIDENTIALITY: You and/or your care partner have signed paperwork which will be entered into your electronic medical record.  These signatures attest to the fact that that the information above on your After Visit Summary has been reviewed and is understood.  Full responsibility of the confidentiality of this  discharge information lies with you and/or your care-partner.  Recommendations Await pathology results Hold aspirin, aspirin products, and anti-inflammatory medications for 1 week  CT scan of the abdomen and pelvis Referral to Dr. Leighton Ruff, colorectal surgery

## 2013-06-25 NOTE — Progress Notes (Signed)
Pt stable to RR 

## 2013-06-25 NOTE — Telephone Encounter (Signed)
Scheduled pt for CT Chest/ABD/Pe;vis for 06/28/13. Robert Rhodes in [facility] will give instructions.

## 2013-06-25 NOTE — Op Note (Signed)
[place]  [address] [place], [id number]   COLONOSCOPY PROCEDURE REPORT  PATIENT: Robert Rhodes, Robert Rhodes  MR#: [id number] BIRTHDATE: 11-14-1945 , 54  yrs. old GENDER: Male ENDOSCOPIST: Jerene Bears, MD PROCEDURE DATE:  06/25/2013 PROCEDURE:   Colonoscopy with biopsy, Colonoscopy with snare polypectomy, and Submucosal injection, any substance First Screening Colonoscopy - Avg.  risk and is 50 yrs.  old or older - No.  Prior Negative Screening - Now for repeat screening. N/A  History of Adenoma - Now for follow-up colonoscopy & has been > or = to 3 yrs.  N/A  Polyps Removed Today? Yes. 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 MEDICATIONS: MAC sedation, administered by CRNA and propofol (Diprivan) 439m IV  DESCRIPTION OF PROCEDURE:   After the risks benefits and alternatives of the procedure were thoroughly explained, informed consent was obtained.  A digital rectal exam revealed no rectal mass.   The LB [id number] endoscope was introduced through the anus and advanced to the cecum, which was identified by both the appendix and ileocecal valve. No adverse events experienced. The quality of the prep was Moviprep fair  The instrument was then slowly withdrawn as the colon was fully examined.    COLON FINDINGS: Moderate melanosis was found throughout the entire examined colon.   A firm mass measuring 3 X 4cm in size was found at the ileocecal valve.  Multiple biopsies of the lesion were performed using cold forceps.  A tattoo was applied in the ascending colon, distal to the mass lesion.   Three sessile polyps measuring 3-6 mm in size were found at the splenic flexure, in the descending colon, and sigmoid colon.  Polypectomy was performed using cold snare.  All resections were complete and all polyp tissue was completely retrieved.   The colon mucosa was otherwise normal without evidence of active colitis  or proctitis.  Multiple biopsies were obtained in random fashion from the right colon, left colon and rectum (3 separate jars).  Retroflexed views revealed no abnormalities. The time to cecum=6 minutes 32 seconds.  Withdrawal time=27 minutes 46 seconds.  The scope was withdrawn and the procedure completed.  COMPLICATIONS: There were no complications.  ENDOSCOPIC IMPRESSION: 1.   Moderate melanosis was found throughout the entire examined colon 2.   Mass measuring 3 X 4cm in size were found at the ileocecal valve; multiple biopsies of the lesion were performed; a tattoo was applied 3.   Three sessile polyps measuring 3-6 mm in size were found at the splenic flexure, in the descending colon, and sigmoid colon; Polypectomy was performed using cold snare 4.   The colon mucosa was otherwise normal without evidence of currently active colitis/proctitis; random biopsies from the right colon, left colon, and rectum  RECOMMENDATIONS: 1.  Await pathology results 2.  Hold aspirin, aspirin products, and anti-inflammatory medication for 1 week. 3.  My office will arrange for you to have a CT scan of abdomen and pelvis. 4.  Referral to Dr.  ALeighton Ruff Colorectal Surgery   eSigned:  JJerene Bears MD 06/25/2013 2:30 PM   cc: The Patient and JDoreene BurkeMD, Olugbemiga   PATIENT NAME:  [name], BielaMR#: [id number]

## 2013-06-25 NOTE — Progress Notes (Signed)
Called to room to assist during endoscopic procedure.  Patient ID and intended procedure confirmed with present staff. Received instructions for my participation in the procedure from the performing physician.  

## 2013-06-26 ENCOUNTER — Telehealth: Payer: Self-pay | Admitting: *Deleted

## 2013-06-26 NOTE — Telephone Encounter (Signed)
  Follow up Call-  Call back number 06/25/2013  Post procedure Call Back phone  # [phone]  Permission to leave phone message Yes     Patient questions:  Do you have a fever, pain , or abdominal swelling? no Pain Score  0 *  Have you tolerated food without any problems? yes  Have you been able to return to your normal activities? yes  Do you have any questions about your discharge instructions: Diet   no Medications  no Follow up visit  no  Do you have questions or concerns about your Care? no  Actions: * If pain score is 4 or above: No action needed, pain <4.

## 2013-06-27 ENCOUNTER — Telehealth: Payer: Self-pay | Admitting: *Deleted

## 2013-06-27 DIAGNOSIS — K6389 Other specified diseases of intestine: Secondary | ICD-10-CM

## 2013-06-27 DIAGNOSIS — R933 Abnormal findings on diagnostic imaging of other parts of digestive tract: Secondary | ICD-10-CM

## 2013-06-27 DIAGNOSIS — R918 Other nonspecific abnormal finding of lung field: Secondary | ICD-10-CM

## 2013-06-27 NOTE — Telephone Encounter (Signed)
Informed pt he will need to get a referral for his visit to surgery. He stated understanding.

## 2013-06-27 NOTE — Telephone Encounter (Signed)
Message copied by Lance Morin on Thu Jun 27, 2013  4:40 PM ------      Message from: Aviva Signs      Created: Thu Jun 27, 2013  3:38 PM       Socrates Cahoon,Dr Lindell Noe will have to refer pt over to [facility] due to his [facility].Is this doc in your office?? Thanks Thayer Headings      ----- Message -----         From: Olene Floss, RN         Sent: 06/27/2013   1:32 PM           To: Aviva Signs            Can you give me an appt with Dr Leighton Ruff for mass at ileocecal valve? Thanks. Caren Griffins       ------

## 2013-06-27 NOTE — Telephone Encounter (Signed)
Sent a request to Aviva Signs at [facility] for an appt with Dr Leighton Ruff.

## 2013-06-28 ENCOUNTER — Ambulatory Visit
Admission: RE | Admit: 2013-06-28 | Discharge: 2013-06-28 | Disposition: A | Discharge status: Home / Self Care | Payer: Medicare FFS | Source: Ambulatory Visit | Attending: Internal Medicine | Admitting: Internal Medicine

## 2013-06-28 ENCOUNTER — Ambulatory Visit (INDEPENDENT_AMBULATORY_CARE_PROVIDER_SITE_OTHER)
Admission: RE | Admit: 2013-06-28 | Discharge: 2013-06-28 | Disposition: A | Discharge status: Home / Self Care | Payer: Medicare FFS | Source: Ambulatory Visit | Attending: Internal Medicine | Admitting: Internal Medicine

## 2013-06-28 DIAGNOSIS — K639 Disease of intestine, unspecified: Secondary | ICD-10-CM

## 2013-06-28 DIAGNOSIS — K6389 Other specified diseases of intestine: Secondary | ICD-10-CM

## 2013-06-28 MED ORDER — IOHEXOL 300 MG/ML  SOLN
100.0000 mL | Freq: Once | INTRAMUSCULAR | Status: AC | PRN
  Administered 2013-06-28: 100 mL via INTRAVENOUS

## 2013-06-28 NOTE — Addendum Note (Signed)
Addended by: Allyson Sabal MD, Ascencion Dike on: 06/28/2013 09:55 AM   Modules accepted: Orders

## 2013-07-02 NOTE — Telephone Encounter (Signed)
We are still having problems with pt's referral. The back of the card has not been scanned into EPIC. Aviva Signs at [facility] is trying to give the pt an appt, but needs to see the back of the card. Pt reports he did go to [facility] and ask for a referral. States he gave them the name of the Surgeon and they stated they would do it. After speaking with Thayer Headings, pt will go over to [facility] tomorrow and Thayer Headings will talk to him about his card. She states she had called him several days ago because the name on his card lists Dr Lindell Noe as his PCP. Dr Lindell Noe was not accepting any more pts so he went to [facility] and was seen by Dr Angelica Chessman. 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 Marcello Moores to consider resection He needs a referral to medical oncology Pursuing MRI abdomen to better characterize the liver lesion seen by CT I asked that he call me as questions or concerns arise, he voiced understanding He would like his surgical and oncology appointments to NOT be scheduled on January 30 through February 1 (due to other obligations) He will need a colonoscopy one year after surgical resection    3:50pm Thayer Headings is still working on [employer]. Pt will have his OPEN MRI on 07/04/13. Gave him instructions and directions to [facility]. Referral made to Oncology. Pt stated understanding.

## 2013-07-05 ENCOUNTER — Telehealth: Payer: Self-pay | Admitting: *Deleted

## 2013-07-05 NOTE — Telephone Encounter (Signed)
Spoke with patient by phone and gave appointment for 07/16/13 with Dr. Benay Spice.  He stated he had his MR today.  He stated he knew about his appointment with surgeon on 07/15/13.  Contact name, directions, and phone numbers were provided.

## 2013-07-11 ENCOUNTER — Other Ambulatory Visit: Payer: Medicare FFS

## 2013-07-11 NOTE — Telephone Encounter (Signed)
Pt has a new card with appt with Dr Marcello Moores and the [facility]; he has had his CT and MRI.

## 2013-07-15 ENCOUNTER — Encounter (INDEPENDENT_AMBULATORY_CARE_PROVIDER_SITE_OTHER): Payer: Self-pay | Admitting: General Surgery

## 2013-07-15 ENCOUNTER — Ambulatory Visit (INDEPENDENT_AMBULATORY_CARE_PROVIDER_SITE_OTHER): Payer: Commercial Managed Care - HMO | Admitting: General Surgery

## 2013-07-15 ENCOUNTER — Encounter (INDEPENDENT_AMBULATORY_CARE_PROVIDER_SITE_OTHER): Payer: Self-pay

## 2013-07-15 VITALS — BP 138/82 | HR 78 | Temp 97.8°F | Resp 18 | Ht 69.0 in | Wt 222.5 lb

## 2013-07-15 DIAGNOSIS — C189 Malignant neoplasm of colon, unspecified: Secondary | ICD-10-CM

## 2013-07-15 NOTE — Progress Notes (Signed)
Chief Complaint  Patient presents with  . Colon Cancer    HISTORY: Robert Rhodes is a 68 y.o. male who presents to the office with a cecal mass found on colonoscopy.  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  The remaining colon mucosa was normal without evidence of active inflammation.  Biopsies were negative as well. He does have some minimal microscopic chronic inflammation in his rectum but no evidence of disease on colonoscopy. He states that he has a flare approximately every 2-3 years and uses sulfasalazine for control. He denies any weight loss. He denies any changes in his bowel habits. He denies any nausea or vomiting.  He denies any chest pain or shortness of breath. He states that he can walk up a flight of stairs without getting significantly winded or having chest pain.  Past Medical History  Diagnosis Date  . Vitamin D deficiency   . UC (ulcerative colitis)   . Arthritis   . Osteoporosis       Past Surgical History  Procedure Laterality Date  . Ventral hernia repair        Current Outpatient Prescriptions  Medication Sig Dispense Refill  . AMBULATORY NON FORMULARY MEDICATION Slippery elm 1 tablet by mouth once daily      . AMBULATORY NON FORMULARY MEDICATION Peppermint Oil One capsule by mouth three times a day as needed      . AMBULATORY NON FORMULARY MEDICATION Raspberry fiber blend Three tsp daily      . AMBULATORY NON FORMULARY MEDICATION Artichoke  Take 1 capsule by mouth daily      . famotidine (PEPCID) 20 MG tablet Take 1 tablet (20 mg total) by mouth 2 (two) times daily.  60 tablet  3  . naproxen (NAPROSYN) 500 MG tablet Take 1 tablet (500 mg total) by mouth 2 (two) times daily with a meal.  60 tablet  1  . sulfaSALAzine (AZULFIDINE) 500 MG tablet Take 1 tablet (500 mg total) by mouth 4 (four) times daily.  120 tablet  3  . traMADol (ULTRAM) 50 MG tablet Take 1 tablet (50 mg total) by mouth every 8 (eight) hours as needed.  60 tablet  1  . vitamin E 100  UNIT capsule Take 100 Units by mouth daily.       No current facility-administered medications for this visit.      Allergies  Allergen Reactions  . Advil [Ibuprofen]   . Aspirin   . Tylenol [Acetaminophen]       Family History  Problem Relation Age of Onset  . Cancer Mother     type unknown  . Diabetes Father   . Heart disease Father   . Heart disease Sister   . Heart attack Brother       History   Social History  . Marital Status: Single    Spouse Name: N/A    Number of Children: 0  . Years of Education: N/A   Occupational History  . retired    Social History Main Topics  . Smoking status: Former Smoker    Types: Cigarettes    Quit date: 06/14/2003  . Smokeless tobacco: Never Used     Comment: quit in July 2005  . Alcohol Use: No     Comment: quit in 2005  . Drug Use: No  . Sexual Activity: None   Other Topics Concern  . None   Social History Narrative  . None       REVIEW OF SYSTEMS - PERTINENT POSITIVES  ONLY: Review of Systems - General ROS: negative for - chills or fever Hematological and Lymphatic ROS: negative for - bleeding problems, blood clots or weight loss Respiratory ROS: no cough, shortness of breath, or wheezing Cardiovascular ROS: no chest pain or dyspnea on exertion Gastrointestinal ROS: no abdominal pain, change in bowel habits, or black or bloody stools Genito-Urinary ROS: no dysuria, trouble voiding, or hematuria  EXAM: Filed Vitals:   07/15/13 1414  BP: 138/82  Pulse: 78  Temp: 97.8 F (36.6 C)  Resp: 18    Gen:  No acute distress.  Well nourished and well groomed.   Neurological: Alert and oriented to person, place, and time. Coordination normal.  Head: Normocephalic and atraumatic.  Eyes: Conjunctivae are normal. Pupils are equal, round, and reactive to light. No scleral icterus.  Neck: Normal range of motion. Neck supple. No tracheal deviation or thyromegaly present.  No cervical lymphadenopathy. Cardiovascular: Normal  rate, regular rhythm, normal heart sounds and intact distal pulses.  Exam reveals no gallop and no friction rub.  No murmur heard. Respiratory: Effort normal.  No respiratory distress. No chest wall tenderness. Breath sounds normal.  No wheezes, rales or rhonchi.  GI: Soft. Bowel sounds are normal. The abdomen is soft and nontender.  There is no rebound and no guarding. There is a large upper abdominal hernia. There are no major surgical scars. Musculoskeletal: Normal range of motion. Extremities are nontender.  Skin: Skin is warm and dry. No rash noted. No diaphoresis. No erythema. No pallor. No clubbing, cyanosis, or edema.   Psychiatric: Normal mood and affect. Behavior is normal. Judgment and thought content normal.    RADIOLOGY RESULTS:   Images and reports are reviewed. CT chest abdomen and pelvis IMPRESSION:  1. Cecal mass without local/ regional metastatic disease.  2. Heterogeneous lesion in the periphery of the right hepatic lobe.  While a hemangioma can have this appearance, metastatic disease  cannot be definitively excluded. Additional evaluation with MR  abdomen without and with contrast is recommended, as clinically  indicated.  3. Pathologically enlarged periesophageal lymph nodes, possibly  metastatic.  4. Probable muscular asymmetry in the lateral left supraclavicular  fossa, incompletely imaged. Please correlate with physical exam as a  mass cannot be definitively excluded.  5. Previous ventral hernia repair with recurrent large fat  containing midline ventral hernia.  6. Mild prostate enlargement.     ASSESSMENT AND PLAN: Robert Rhodes is a 68 y.o. M With a long-standing history of Ulcerative colitis.  His most recent colonoscopy shows minimal inflammation but a new cecal cancer. His CT scans are concerning for possible metastatic disease and also showed evidence of a recurrent ventral hernia. The patient states that he has had his MRI but is on a CD disc.  He did  not have that with him today. His CT scans are also concerning for some para-esophageal lymph nodes that are pathologically enlarged.  He has not yet had a CEA level.  We discussed possible surgical resection.  This would depend on the presence of metastatic disease. If there is no obvious metastatic disease, I think it would be reasonable to proceed with a laparoscopic total abdominal colectomy and ileorectal anastomosis, given that he has minimal rectal inflammation.  I do not feel that a total proctocolectomy is necessary in this patient. He will see Dr. Benay Spice tomorrow to discuss his cancer workup. I have instructed him to bring the MRI disc with him to his appointment tomorrow.  Once he has seen  Dr. Benay Spice, I think it is reasonable to discuss his case in our multidisciplinary cancer conference. We will plan on a surgical resection if there are no signs of metastatic disease.    Rosario Adie, MD Colon and Rectal Surgery / [facility]      Visit Diagnoses: 1. Colon cancer     Primary Care Physician: Angelica Chessman, MD

## 2013-07-15 NOTE — Patient Instructions (Addendum)
Colorectal Cancer  Colorectal cancer is the second most common cancer in the [place], striking 140,000 people annually and causing 60,000 deaths. That's a staggering figure when you consider the disease is potentially curable if diagnosed in the early stages. Who is at risk? Though colorectal cancer may occur at any age, more than 90% of the patients are over age 70, at which point the risk doubles every ten years. In addition to age, other high risk factors include a family history of colorectal cancer and polyps and a personal history of ulcerative colitis, colon polyps or cancer of other organs, especially of the breast or uterus. How does it start? It is generally agreed that nearly all colon and rectal cancer begins in benign polyps. These pre-malignant growths occur on the bowel wall and may eventually increase in size and become cancer. Removal of benign polyps is one aspect of preventive medicine that really works! What are the symptoms? The most common symptoms are rectal bleeding and changes in bowel habits, such as constipation or diarrhea. (These symptoms are also common in other diseases so it is important you receive a thorough examination should you experience them.) Abdominal pain and weight loss are usually late symptoms indicating possible extensive disease. Unfortunately, many polyps and early cancers fail to produce symptoms. Therefore, it is important that your routine physical includes colorectal cancer detection procedures once you reach age 70.  There are several methods for detection of colorectal cancer. These include digital rectal examination, a chemical test of the stool for blood, flexible sigmoidoscopy and colonoscopy (lighted tubular instruments used to inspect the lower bowel) and barium enema. Be sure to discuss these options with your surgeon to determine which procedure is best for you. Individuals who have a first-degree relative (parent or sibling) with colon  cancer or polyps should start their colon cancer screening at the age of 70. How is colorectal cancer treated? Colorectal cancer requires surgery in nearly all cases for complete cure. Radiation and chemotherapy are sometimes used in addition to surgery. Between 80-90% are restored to normal health if the cancer is detected and treated in the earliest stages. The cure rate drops to 50% or less when diagnosed in the later stages. Thanks to [employer], less than 5% of all colorectal cancer patients require a colostomy, the surgical construction of an artificial excretory opening from the colon. Can colon cancer be prevented? Colon cancer is very preventable. The most important step towards preventing colon cancer is getting a screening test. Any abnormal screening test should be followed by a colonoscopy. Some individuals prefer to start with colonoscopy as a screening test. Colonoscopy provides a detailed examination of the bowel. Polyps can be identified and can often be removed during colonoscopy. Though not definitely proven, there is some evidence that diet may play a significant role in preventing colorectal cancer. As far as we know, a high fiber, low fat diet is the only dietary measure that might help prevent colorectal cancer. Finally, pay attention to changes in your bowel habits. Any new changes such as persistent constipation, diarrhea, or blood in the stool should be discussed with your physician.   Can hemorrhoids lead to colon cancer? No, but hemorrhoids may produce symptoms similar to colon polyps or cancer. Should you experience these symptoms, you should have them examined and evaluated by a physician, preferably by a colon and rectal surgeon. What is a colon and rectal surgeon? Colon and rectal surgeons are experts in the surgical and non-surgical treatment  of diseases of the colon, rectum and anus. They have completed advanced surgical training in the treatment of these  diseases as well as full general surgical training. Board-certified colon and rectal surgeons complete residencies in general surgery and colon and rectal surgery, and pass intensive examinations conducted by the American Board of Surgery and the American Board of Colon and Rectal Surgery. They are well-versed in the treatment of both benign and malignant diseases of the colon, rectum and anus and are able to perform routine screening examinations and surgically treat conditions if indicated to do so.  2012 American Society of Colon & Rectal Surgeons

## 2013-07-16 ENCOUNTER — Ambulatory Visit (HOSPITAL_BASED_OUTPATIENT_CLINIC_OR_DEPARTMENT_OTHER): Payer: Commercial Managed Care - HMO | Admitting: Oncology

## 2013-07-16 ENCOUNTER — Other Ambulatory Visit (HOSPITAL_COMMUNITY): Payer: Self-pay | Admitting: Diagnostic Radiology

## 2013-07-16 ENCOUNTER — Encounter: Payer: Self-pay | Admitting: Oncology

## 2013-07-16 ENCOUNTER — Ambulatory Visit (HOSPITAL_BASED_OUTPATIENT_CLINIC_OR_DEPARTMENT_OTHER): Payer: Medicare FFS

## 2013-07-16 ENCOUNTER — Ambulatory Visit
Admission: RE | Admit: 2013-07-16 | Discharge: 2013-07-16 | Disposition: A | Discharge status: Home / Self Care | Payer: Self-pay | Source: Ambulatory Visit | Attending: Diagnostic Radiology | Admitting: Diagnostic Radiology

## 2013-07-16 ENCOUNTER — Telehealth: Payer: Self-pay | Admitting: Oncology

## 2013-07-16 ENCOUNTER — Ambulatory Visit: Payer: Medicare FFS

## 2013-07-16 VITALS — BP 169/89 | HR 70 | Temp 97.1°F | Resp 18 | Ht 69.0 in | Wt 222.1 lb

## 2013-07-16 DIAGNOSIS — K5190 Ulcerative colitis, unspecified, without complications: Secondary | ICD-10-CM

## 2013-07-16 DIAGNOSIS — C189 Malignant neoplasm of colon, unspecified: Secondary | ICD-10-CM

## 2013-07-16 DIAGNOSIS — Z139 Encounter for screening, unspecified: Secondary | ICD-10-CM

## 2013-07-16 DIAGNOSIS — C180 Malignant neoplasm of cecum: Secondary | ICD-10-CM

## 2013-07-16 NOTE — Progress Notes (Signed)
Met with Robert Rhodes and family. Explained role of nurse navigator. Educational information provided on colorectal cancer [place] resources provided to patient, including SW service information and support groups.  Contact names and phone numbers were provided for entire [facility] team.  Teach back method was used.  No barriers to care identified.  Patient's case to be presented at tomorrow's ca conf.  Will continue to follow as needed.

## 2013-07-16 NOTE — Progress Notes (Signed)
[place] Patient Consult   Referring MD: Robert Rhodes 68 y.o.  10-06-1945    Reason for Referral: Colon cancer     HPI: Robert Rhodes has a history of ulcerative colitis and reports last having a colonoscopy approximately 10 years ago while living in [place]. He was referred to Dr.Pyrtle and underwent a colonoscopy 06/25/2013. A firm mass was found at the ileocecal valve. Multiple biopsies were obtained. The area was tattooed. 3 sessile polyps were found at the splenic flexure, descending colon, and sigmoid colon. All were resected. No evidence of active colitis or proctitis. Multiple random biopsies were obtained from the colon.  The pathology ([id number]) revealed adenocarcinoma at the cecum biopsy. The polyps returned as 2 tubular adenomas and benign polypoid colonic fragment with melanosis. A biopsy from the rectum revealed chronic minimally active colitis, negative for dysplasia.  Robert Rhodes reports feeling well.  He was referred for CTs of the chest, abdomen, and pelvis on 06/28/2013. Lymph nodes adjacent to the distal esophagus measured up to 1.4 cm, heterogenous lesion in the periphery of the right liver measured 2.1 cm. Well-circumscribed lesions in the caudate lobe measured up to 1.3 cm and were felt to be cysts. Soft tissue mass at the cecum. No pathologically enlarged lymph nodes in the abdomen or pelvis. Mildly enlarged prostate.  An MRI on 07/05/2013 confirmed a 2.2 cm subcapsular right hepatic lobe nodule favored to represent an atypical hemangioma. Simple appearing caudate lobe cysts. The liver was otherwise normal.  Past Medical History  Diagnosis Date  . Vitamin D deficiency   . UC (ulcerative colitis)  1994   .  osteoarthritis    . Osteoporosis     Past Surgical History  Procedure Laterality Date  . Ventral hernia repair   2008     Family History  Problem Relation Age of Onset  . Cancer Mother      right  leg/knee-status post an amputation   . Diabetes Father   . Heart disease Father   . Heart disease Sister   . Heart attack Brother    5 sisters and 2 brothers. One brother had prostate cancer. No other family history of cancer.   Current outpatient prescriptions:AMBULATORY NON FORMULARY MEDICATION, Slippery elm 1 tablet by mouth once daily, Disp: , Rfl: ;  AMBULATORY NON FORMULARY MEDICATION, Peppermint Oil One capsule by mouth three times a day as needed, Disp: , Rfl: ;  AMBULATORY NON FORMULARY MEDICATION, Raspberry fiber blend Three tsp daily, Disp: , Rfl: ;  AMBULATORY NON FORMULARY MEDICATION, Artichoke  Take 1 capsule by mouth daily, Disp: , Rfl:  famotidine (PEPCID) 20 MG tablet, Take 1 tablet (20 mg total) by mouth 2 (two) times daily., Disp: 60 tablet, Rfl: 3;  naproxen (NAPROSYN) 500 MG tablet, Take 1 tablet (500 mg total) by mouth 2 (two) times daily with a meal., Disp: 60 tablet, Rfl: 1;  sulfaSALAzine (AZULFIDINE) 500 MG tablet, Take 1 tablet (500 mg total) by mouth 4 (four) times daily., Disp: 120 tablet, Rfl: 3 traMADol (ULTRAM) 50 MG tablet, Take 1 tablet (50 mg total) by mouth every 8 (eight) hours as needed., Disp: 60 tablet, Rfl: 1;  vitamin E 100 UNIT capsule, Take 100 Units by mouth daily., Disp: , Rfl:   Allergies:  Allergies  Allergen Reactions  . Advil [Ibuprofen]   . Aspirin   . Tylenol [Acetaminophen]     Social History: He lives with a niece in [place]. He is retired.  He previously worked in Biomedical scientist. He quit smoking cigarettes in 2005. He quit drinking beer in 2005. No risk factor for HIV or hepatitis.   ROS:   Positives include: Intermittent flares of "colitis ", last one month ago with associated rectal bleeding-improved with sulfasalazine, nocturia and decreased urinary stream  A complete ROS was otherwise negative.  Physical Exam:  Blood pressure 169/89, pulse 70, temperature 97.1 F (36.2 C), temperature source Oral, resp. rate 18, height 5' 9"   (1.753 m), weight 222 lb 1.6 oz (100.744 kg).  HEENT: Upper and lower denture plate, oral cavity without visible mass, neck without mass Lungs: Clear bilaterally Cardiac: Regular rate and rhythm Abdomen: Large ventral hernia, no hepatosplenomegaly, no mass, nontender GU: Testes without mass  Vascular: No leg edema Lymph nodes: No cervical, supraclavicular, axillary, or inguinal nodes Neurologic: Alert and oriented, the motor exam appears intact in the upper and lower extremities Skin: Multiple white papules in the left groin, no rash Musculoskeletal: No spine tenderness   LAB:  CBC  Lab Results  Component Value Date   WBC 5.2 03/05/2013   HGB 14.4 03/05/2013   HCT 43.0 03/05/2013   MCV 83.0 03/05/2013   PLT 317 03/05/2013   NEUTROABS 2.5 03/05/2013     CMP      Component Value Date/Time   NA 141 03/05/2013 1021   K 3.5 03/05/2013 1021   CL 103 03/05/2013 1021   CO2 33* 03/05/2013 1021   GLUCOSE 101* 03/05/2013 1021   BUN 6 06/25/2013 1529   CREATININE 1.0 06/25/2013 1529   CREATININE 0.90 03/05/2013 1021   CALCIUM 9.4 03/05/2013 1021   PROT 7.1 03/05/2013 1021   ALBUMIN 3.6 03/05/2013 1021   AST 16 03/05/2013 1021   ALT 18 03/05/2013 1021   ALKPHOS 73 03/05/2013 1021   BILITOT 0.5 03/05/2013 1021     Radiology: As per history of present illness. I reviewed 06/28/2013 CT images with Robert Rhodes    Assessment/Plan:   1. Adenocarcinoma of the cecum 2. indeterminate liver lesions noted on a CT 06/28/2013. 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 Prominent lymph nodes adjacent to the distal esophagus on the CT 06/28/2013 4. Soft tissue asymmetry at the lateral left supraclavicular fossa on the CT 06/28/2013 5. Ulcerative colitis, minimally active colitis noted on a random biopsy of the rectum 06/25/2013 6. Enlarged prostate on the CT 06/28/2013, symptoms of prostatic hypertrophy 7. ventral  hernia   Disposition:   Robert Rhodes had been diagnosed with colon cancer. He appears to have localized disease based on the staging evaluation to date. The indeterminate liver lesions on the 06/28/2013 CT appear to represent an atypical hemangioma and simple cysts.  He appears to be a candidate for resection of the primary tumor. We will followup on the surgical pathology and recommend adjuvant therapy as indicated.  A CEA and PSA were checked today. His case will be presented at the GI tumor conference 07/17/2013.  Robert Rhodes is scheduled for an office visit in Medical Oncology 08/13/2013.  [place], [place] 07/16/2013, 2:25 PM

## 2013-07-16 NOTE — Telephone Encounter (Signed)
pt sent to lab and given appt schedule for march

## 2013-07-17 ENCOUNTER — Telehealth: Payer: Self-pay | Admitting: *Deleted

## 2013-07-17 ENCOUNTER — Encounter (HOSPITAL_COMMUNITY): Payer: Self-pay | Admitting: *Deleted

## 2013-07-17 ENCOUNTER — Other Ambulatory Visit: Payer: Self-pay

## 2013-07-17 ENCOUNTER — Telehealth: Payer: Self-pay

## 2013-07-17 DIAGNOSIS — C189 Malignant neoplasm of colon, unspecified: Secondary | ICD-10-CM

## 2013-07-17 DIAGNOSIS — K229 Disease of esophagus, unspecified: Secondary | ICD-10-CM

## 2013-07-17 LAB — PSA: PSA: 0.6 ng/mL (ref <=4.00)

## 2013-07-17 LAB — CEA: CEA: 1.2 ng/mL (ref 0.0–5.0)

## 2013-07-17 NOTE — Telephone Encounter (Signed)
Message copied by Barron Alvine on Wed Jul 17, 2013  8:19 AM ------      Message from: Owens Loffler P      Created: Wed Jul 17, 2013  8:09 AM       Ulice Dash,      At today's GI cancer conference, we discussed Mr. Danielson. He has unusual distal esophagus with a couple nodes nearby.  Consensus was that he needs upper endo with EUS as well (to FNA nodes just in case).  I can set this up if OK with you.            Helia Haese,      Can you contact him later today about upper EUS, radial +/- linear, next Thursday or add on tomorrow if possible. For colon cancer, abnormal esophagus, paraesohageal LNs.            thanks ------

## 2013-07-17 NOTE — Telephone Encounter (Signed)
Per requests of Dr. Ardis Hughs and Dr. Benay Spice, patient was called and informed to expect a call later from Dr. Eugenia Pancoast office for an appointment for EUS.  Explained to patient that his case was discussed at today's cancer conference and that the recommendation from the group was to address the abnormal esophagus, paraesophageal LNs that were showing up on his scans.  This finding was mentioned to him at 07/16/13 visit with Dr. Benay Spice.  Patient verbalized understanding and expressed appreciation for the call.

## 2013-07-17 NOTE — Telephone Encounter (Signed)
Message copied by Tania Ade on Wed Jul 17, 2013  4:00 PM ------      Message from: Betsy Coder B      Created: Wed Jul 17, 2013  3:36 PM       Please call patient, cea and psa are normal ------

## 2013-07-17 NOTE — Telephone Encounter (Signed)
EUS scheduled, pt instructed and medications reviewed.  Patient instructions mailed to home.  Patient to call with any questions or concerns.  

## 2013-07-17 NOTE — Telephone Encounter (Signed)
Pt has been scheduled for 07/18/13 1230 pm needs instructions

## 2013-07-17 NOTE — Telephone Encounter (Signed)
Notified patient that both results were in normal range

## 2013-07-18 ENCOUNTER — Encounter (HOSPITAL_COMMUNITY): Payer: Medicare HMO | Admitting: Anesthesiology

## 2013-07-18 ENCOUNTER — Ambulatory Visit (HOSPITAL_COMMUNITY): Payer: Medicare HMO | Admitting: Anesthesiology

## 2013-07-18 ENCOUNTER — Encounter (HOSPITAL_COMMUNITY)
Admission: RE | Disposition: A | Discharge status: Home / Self Care | Payer: Self-pay | Source: Ambulatory Visit | Attending: Gastroenterology

## 2013-07-18 ENCOUNTER — Encounter (HOSPITAL_COMMUNITY): Payer: Self-pay

## 2013-07-18 ENCOUNTER — Telehealth: Payer: Self-pay | Admitting: Gastroenterology

## 2013-07-18 ENCOUNTER — Ambulatory Visit (HOSPITAL_COMMUNITY)
Admission: RE | Admit: 2013-07-18 | Discharge: 2013-07-18 | Disposition: A | Discharge status: Home / Self Care | Payer: Medicare HMO | Source: Ambulatory Visit | Attending: Gastroenterology | Admitting: Gastroenterology

## 2013-07-18 DIAGNOSIS — Z79899 Other long term (current) drug therapy: Secondary | ICD-10-CM | POA: Diagnosis not present

## 2013-07-18 DIAGNOSIS — K449 Diaphragmatic hernia without obstruction or gangrene: Secondary | ICD-10-CM | POA: Insufficient documentation

## 2013-07-18 DIAGNOSIS — M810 Age-related osteoporosis without current pathological fracture: Secondary | ICD-10-CM | POA: Insufficient documentation

## 2013-07-18 DIAGNOSIS — K208 Other esophagitis without bleeding: Secondary | ICD-10-CM | POA: Insufficient documentation

## 2013-07-18 DIAGNOSIS — K229 Disease of esophagus, unspecified: Secondary | ICD-10-CM

## 2013-07-18 DIAGNOSIS — K219 Gastro-esophageal reflux disease without esophagitis: Secondary | ICD-10-CM | POA: Insufficient documentation

## 2013-07-18 DIAGNOSIS — Z87891 Personal history of nicotine dependence: Secondary | ICD-10-CM | POA: Insufficient documentation

## 2013-07-18 DIAGNOSIS — R933 Abnormal findings on diagnostic imaging of other parts of digestive tract: Secondary | ICD-10-CM

## 2013-07-18 DIAGNOSIS — R599 Enlarged lymph nodes, unspecified: Secondary | ICD-10-CM | POA: Insufficient documentation

## 2013-07-18 DIAGNOSIS — K209 Esophagitis, unspecified without bleeding: Secondary | ICD-10-CM

## 2013-07-18 DIAGNOSIS — C180 Malignant neoplasm of cecum: Secondary | ICD-10-CM | POA: Diagnosis not present

## 2013-07-18 DIAGNOSIS — C189 Malignant neoplasm of colon, unspecified: Secondary | ICD-10-CM

## 2013-07-18 DIAGNOSIS — K432 Incisional hernia without obstruction or gangrene: Secondary | ICD-10-CM | POA: Diagnosis not present

## 2013-07-18 HISTORY — DX: Gastro-esophageal reflux disease without esophagitis: K21.9

## 2013-07-18 HISTORY — PX: EUS: SHX5427

## 2013-07-18 SURGERY — UPPER ENDOSCOPIC ULTRASOUND (EUS) RADIAL
Anesthesia: Monitor Anesthesia Care

## 2013-07-18 MED ORDER — PROPOFOL 10 MG/ML IV BOLUS
INTRAVENOUS | Status: AC
  Filled 2013-07-18: qty 20

## 2013-07-18 MED ORDER — FENTANYL CITRATE 0.05 MG/ML IJ SOLN: 25.0000 ug | INTRAMUSCULAR | Status: DC | PRN

## 2013-07-18 MED ORDER — OMEPRAZOLE 40 MG PO CPDR: 40.0000 mg | DELAYED_RELEASE_CAPSULE | Freq: Two times a day (BID) | ORAL | Status: DC

## 2013-07-18 MED ORDER — SODIUM CHLORIDE 0.9 % IV SOLN: INTRAVENOUS | Status: DC

## 2013-07-18 MED ORDER — LACTATED RINGERS IV SOLN
INTRAVENOUS | Status: DC
  Administered 2013-07-18: 1000 mL via INTRAVENOUS

## 2013-07-18 MED ORDER — PROPOFOL INFUSION 10 MG/ML OPTIME
INTRAVENOUS | Status: DC | PRN
  Administered 2013-07-18: 300 ug/kg/min via INTRAVENOUS

## 2013-07-18 NOTE — Anesthesia Preprocedure Evaluation (Addendum)
Anesthesia Evaluation  Patient identified by MRN, date of birth, ID band Patient awake    Reviewed: Allergy & Precautions, H&P , NPO status , Patient's Chart, lab work & pertinent test results  Airway Mallampati: II TM Distance: >3 FB Neck ROM: full    Dental  (+) Teeth Intact and Dental Advisory Given   Pulmonary neg pulmonary ROS, former smoker,  breath sounds clear to auscultation  Pulmonary exam normal       Cardiovascular Exercise Tolerance: Good negative cardio ROS  Rhythm:regular Rate:Normal     Neuro/Psych negative neurological ROS  negative psych ROS   GI/Hepatic negative GI ROS, Neg liver ROS, GERD-  ,UC   Endo/Other  negative endocrine ROS  Renal/GU negative Renal ROS  negative genitourinary   Musculoskeletal   Abdominal   Peds  Hematology negative hematology ROS (+)   Anesthesia Other Findings   Reproductive/Obstetrics negative OB ROS                          Anesthesia Physical Anesthesia Plan  ASA: II  Anesthesia Plan: MAC   Post-op Pain Management:    Induction:   Airway Management Planned: Nasal Cannula  Additional Equipment:   Intra-op Plan:   Post-operative Plan:   Informed Consent: I have reviewed the patients History and Physical, chart, labs and discussed the procedure including the risks, benefits and alternatives for the proposed anesthesia with the patient or authorized representative who has indicated his/her understanding and acceptance.   Dental Advisory Given  Plan Discussed with: CRNA and Surgeon  Anesthesia Plan Comments:         Anesthesia Quick Evaluation

## 2013-07-18 NOTE — Transfer of Care (Signed)
Immediate Anesthesia Transfer of Care Note  Patient: Robert Rhodes  Procedure(s) Performed: Procedure(s): UPPER ENDOSCOPIC ULTRASOUND (EUS) RADIAL (N/A)  Patient Location: PACU and Endoscopy Unit  Anesthesia Type:MAC  Level of Consciousness: sedated and patient cooperative  Airway & Oxygen Therapy: Patient Spontanous Breathing and Patient connected to face mask oxygen  Post-op Assessment: Report given to PACU RN and Post -op Vital signs reviewed and stable  Post vital signs: Reviewed and stable  Complications: No apparent anesthesia complications

## 2013-07-18 NOTE — Anesthesia Postprocedure Evaluation (Signed)
  Anesthesia Post-op Note  Patient: Robert Rhodes  Procedure(s) Performed: Procedure(s) (LRB): UPPER ENDOSCOPIC ULTRASOUND (EUS) RADIAL (N/A)  Patient Location: PACU  Anesthesia Type: MAC  Level of Consciousness: awake and alert   Airway and Oxygen Therapy: Patient Spontanous Breathing  Post-op Pain: mild  Post-op Assessment: Post-op Vital signs reviewed, Patient's Cardiovascular Status Stable, Respiratory Function Stable, Patent Airway and No signs of Nausea or vomiting  Last Vitals:  Filed Vitals:   07/18/13 1400  BP: 128/93  Pulse:   Temp:   Resp: 18    Post-op Vital Signs: stable   Complications: No apparent anesthesia complications

## 2013-07-18 NOTE — Discharge Instructions (Signed)
YOU HAD AN ENDOSCOPIC PROCEDURE TODAY: Refer to the procedure report that was given to you for any specific questions about what was found during the examination.  If the procedure report does not answer your questions, please call your gastroenterologist to clarify.  YOU SHOULD EXPECT: Some feelings of bloating in the abdomen. Passage of more gas than usual.  Walking can help get rid of the air that was put into your GI tract during the procedure and reduce the bloating. If you had a lower endoscopy (such as a colonoscopy or flexible sigmoidoscopy) you may notice spotting of blood in your stool or on the toilet paper.   DIET: Your first meal following the procedure should be a light meal and then it is ok to progress to your normal diet.  A half-sandwich or bowl of soup is an example of a good first meal.  Heavy or fried foods are harder to digest and may make you feel nasueas or bloated.  Drink plenty of fluids but you should avoid alcoholic beverages for 24 hours.  ACTIVITY: Your care partner should take you home directly after the procedure.  You should plan to take it easy, moving slowly for the rest of the day.  You can resume normal activity the day after the procedure however you should NOT DRIVE or use heavy machinery for 24 hours (because of the sedation medicines used during the test).    SYMPTOMS TO REPORT IMMEDIATELY  A gastroenterologist can be reached at any hour.  Please call your doctor's office for any of the following symptoms:   Following lower endoscopy (colonoscopy, flexible sigmoidoscopy)  Excessive amounts of blood in the stool  Significant tenderness, worsening of abdominal pains  Swelling of the abdomen that is new, acute  Fever of 100 or higher  Following upper endoscopy (EGD, EUS, ERCP)  Vomiting of blood or coffee ground material  New, significant abdominal pain  New, significant chest pain or pain under the shoulder blades  Painful or persistently difficult  swallowing  New shortness of breath  Black, tarry-looking stools  FOLLOW UP: If any biopsies were taken you will be contacted by phone or by letter within the next 1-3 weeks.  Call your gastroenterologist if you have not heard about the biopsies in 3 weeks.  Please also call your gastroenterologist's office with any specific questions about appointments or follow up tests.  Monitored Anesthesia Care  Monitored anesthesia care is an anesthesia service for a medical procedure. Anesthesia is the loss of the ability to feel pain. It is produced by medications called anesthetics. It may affect a small area of your body (local anesthesia), a large area of your body (regional anesthesia), or your entire body (general anesthesia). The need for monitored anesthesia care depends your procedure, your condition, and the potential need for regional or general anesthesia. It is often provided during procedures where:   General anesthesia may be needed if there are complications. This is because you need special care when you are under general anesthesia.   You will be under local or regional anesthesia. This is so that you are able to have higher levels of anesthesia if needed.   You will receive calming medications (sedatives). This is especially the case if sedatives are given to put you in a semi-conscious state of relaxation (deep sedation). This is because the amount of sedative needed to produce this state can be hard to predict. Too much of a sedative can produce general anesthesia. Monitored anesthesia care  is performed by one or more caregivers who have special training in all types of anesthesia. You will need to meet with these caregivers before your procedure. During this meeting, they will ask you about your medical history. They will also give you instructions to follow. (For example, you will need to stop eating and drinking before your procedure. You may also need to stop or change medications  you are taking.) During your procedure, your caregivers will stay with you. They will:   Watch your condition. This includes watching you blood pressure, breathing, and level of pain.   Diagnose and treat problems that occur.   Give medications if they are needed. These may include calming medications (sedatives) and anesthetics.   Make sure you are comfortable.  Having monitored anesthesia care does not necessarily mean that you will be under anesthesia. It does mean that your caregivers will be able to manage anesthesia if you need it or if it occurs. It also means that you will be able to have a different type of anesthesia than you are having if you need it. When your procedure is complete, your caregivers will continue to watch your condition. They will make sure any medications wear off before you are allowed to go home.  Document Released: 02/23/2005 Document Revised: 09/24/2012 Document Reviewed: 07/11/2012 [facility] Patient Information 2014 [place], [place].

## 2013-07-18 NOTE — Interval H&P Note (Signed)
History and Physical Interval Note:  07/18/2013 12:24 PM  Robert Rhodes  has presented today for surgery, with the diagnosis of Colon cancer [153.9] Abnormality of esophagus [530.9]  The various methods of treatment have been discussed with the patient and family. After consideration of risks, benefits and other options for treatment, the patient has consented to  Procedure(s): UPPER ENDOSCOPIC ULTRASOUND (EUS) RADIAL (N/A) as a surgical intervention .  The patient's history has been reviewed, patient examined, no change in status, stable for surgery.  I have reviewed the patient's chart and labs.  Questions were answered to the patient's satisfaction.     Milus Banister

## 2013-07-18 NOTE — H&P (View-Only) (Signed)
Chief Complaint  Patient presents with  . Colon Cancer    HISTORY: Robert Rhodes is a 68 y.o. male who presents to the office with a cecal mass found on colonoscopy.  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  The remaining colon mucosa was normal without evidence of active inflammation.  Biopsies were negative as well. He does have some minimal microscopic chronic inflammation in his rectum but no evidence of disease on colonoscopy. He states that he has a flare approximately every 2-3 years and uses sulfasalazine for control. He denies any weight loss. He denies any changes in his bowel habits. He denies any nausea or vomiting.  He denies any chest pain or shortness of breath. He states that he can walk up a flight of stairs without getting significantly winded or having chest pain.  Past Medical History  Diagnosis Date  . Vitamin D deficiency   . UC (ulcerative colitis)   . Arthritis   . Osteoporosis       Past Surgical History  Procedure Laterality Date  . Ventral hernia repair        Current Outpatient Prescriptions  Medication Sig Dispense Refill  . AMBULATORY NON FORMULARY MEDICATION Slippery elm 1 tablet by mouth once daily      . AMBULATORY NON FORMULARY MEDICATION Peppermint Oil One capsule by mouth three times a day as needed      . AMBULATORY NON FORMULARY MEDICATION Raspberry fiber blend Three tsp daily      . AMBULATORY NON FORMULARY MEDICATION Artichoke  Take 1 capsule by mouth daily      . famotidine (PEPCID) 20 MG tablet Take 1 tablet (20 mg total) by mouth 2 (two) times daily.  60 tablet  3  . naproxen (NAPROSYN) 500 MG tablet Take 1 tablet (500 mg total) by mouth 2 (two) times daily with a meal.  60 tablet  1  . sulfaSALAzine (AZULFIDINE) 500 MG tablet Take 1 tablet (500 mg total) by mouth 4 (four) times daily.  120 tablet  3  . traMADol (ULTRAM) 50 MG tablet Take 1 tablet (50 mg total) by mouth every 8 (eight) hours as needed.  60 tablet  1  . vitamin E 100  UNIT capsule Take 100 Units by mouth daily.       No current facility-administered medications for this visit.      Allergies  Allergen Reactions  . Advil [Ibuprofen]   . Aspirin   . Tylenol [Acetaminophen]       Family History  Problem Relation Age of Onset  . Cancer Mother     type unknown  . Diabetes Father   . Heart disease Father   . Heart disease Sister   . Heart attack Brother       History   Social History  . Marital Status: Single    Spouse Name: N/A    Number of Children: 0  . Years of Education: N/A   Occupational History  . retired    Social History Main Topics  . Smoking status: Former Smoker    Types: Cigarettes    Quit date: 06/14/2003  . Smokeless tobacco: Never Used     Comment: quit in July 2005  . Alcohol Use: No     Comment: quit in 2005  . Drug Use: No  . Sexual Activity: None   Other Topics Concern  . None   Social History Narrative  . None       REVIEW OF SYSTEMS - PERTINENT POSITIVES  ONLY: Review of Systems - General ROS: negative for - chills or fever Hematological and Lymphatic ROS: negative for - bleeding problems, blood clots or weight loss Respiratory ROS: no cough, shortness of breath, or wheezing Cardiovascular ROS: no chest pain or dyspnea on exertion Gastrointestinal ROS: no abdominal pain, change in bowel habits, or black or bloody stools Genito-Urinary ROS: no dysuria, trouble voiding, or hematuria  EXAM: Filed Vitals:   07/15/13 1414  BP: 138/82  Pulse: 78  Temp: 97.8 F (36.6 C)  Resp: 18    Gen:  No acute distress.  Well nourished and well groomed.   Neurological: Alert and oriented to person, place, and time. Coordination normal.  Head: Normocephalic and atraumatic.  Eyes: Conjunctivae are normal. Pupils are equal, round, and reactive to light. No scleral icterus.  Neck: Normal range of motion. Neck supple. No tracheal deviation or thyromegaly present.  No cervical lymphadenopathy. Cardiovascular: Normal  rate, regular rhythm, normal heart sounds and intact distal pulses.  Exam reveals no gallop and no friction rub.  No murmur heard. Respiratory: Effort normal.  No respiratory distress. No chest wall tenderness. Breath sounds normal.  No wheezes, rales or rhonchi.  GI: Soft. Bowel sounds are normal. The abdomen is soft and nontender.  There is no rebound and no guarding. There is a large upper abdominal hernia. There are no major surgical scars. Musculoskeletal: Normal range of motion. Extremities are nontender.  Skin: Skin is warm and dry. No rash noted. No diaphoresis. No erythema. No pallor. No clubbing, cyanosis, or edema.   Psychiatric: Normal mood and affect. Behavior is normal. Judgment and thought content normal.    RADIOLOGY RESULTS:   Images and reports are reviewed. CT chest abdomen and pelvis IMPRESSION:  1. Cecal mass without local/ regional metastatic disease.  2. Heterogeneous lesion in the periphery of the right hepatic lobe.  While a hemangioma can have this appearance, metastatic disease  cannot be definitively excluded. Additional evaluation with MR  abdomen without and with contrast is recommended, as clinically  indicated.  3. Pathologically enlarged periesophageal lymph nodes, possibly  metastatic.  4. Probable muscular asymmetry in the lateral left supraclavicular  fossa, incompletely imaged. Please correlate with physical exam as a  mass cannot be definitively excluded.  5. Previous ventral hernia repair with recurrent large fat  containing midline ventral hernia.  6. Mild prostate enlargement.     ASSESSMENT AND PLAN: Robert Rhodes is a 68 y.o. M With a long-standing history of Ulcerative colitis.  His most recent colonoscopy shows minimal inflammation but a new cecal cancer. His CT scans are concerning for possible metastatic disease and also showed evidence of a recurrent ventral hernia. The patient states that he has had his MRI but is on a CD disc.  He did  not have that with him today. His CT scans are also concerning for some para-esophageal lymph nodes that are pathologically enlarged.  He has not yet had a CEA level.  We discussed possible surgical resection.  This would depend on the presence of metastatic disease. If there is no obvious metastatic disease, I think it would be reasonable to proceed with a laparoscopic total abdominal colectomy and ileorectal anastomosis, given that he has minimal rectal inflammation.  I do not feel that a total proctocolectomy is necessary in this patient. He will see Dr. Benay Spice tomorrow to discuss his cancer workup. I have instructed him to bring the MRI disc with him to his appointment tomorrow.  Once he has seen  Dr. Benay Spice, I think it is reasonable to discuss his case in our multidisciplinary cancer conference. We will plan on a surgical resection if there are no signs of metastatic disease.    Rosario Adie, MD Colon and Rectal Surgery / [facility]      Visit Diagnoses: 1. Colon cancer     Primary Care Physician: Angelica Chessman, MD

## 2013-07-18 NOTE — Op Note (Signed)
[facility] [place], [address]   ENDOSCOPIC ULTRASOUND PROCEDURE REPORT  PATIENT: Robert Rhodes, Robert Rhodes  MR#: [id number] BIRTHDATE: [birth year]  GENDER: Male ENDOSCOPIST: Milus Banister, MD REFERRED BY:  Leighton Ruff, MD PROCEDURE DATE:  07/18/2013 PROCEDURE:   Upper EUS w/FNA, EGD with biopsies ASA CLASS:      Class II INDICATIONS:   recently diagnosed cecal adenocarcinoma (Dr. Hilarie Fredrickson colonoscopy); staging CT scan suggests abnormal distal esophagus with pathologically enlarged nearby lymphnodes; no dysphagis, + GERD taking only H2 blocker. MEDICATIONS: MAC sedation, administered by CRNA  DESCRIPTION OF PROCEDURE:   After the risks benefits and alternatives of the procedure were  explained, informed consent was obtained. The patient was then placed in the left, lateral, decubitus postion and IV sedation was administered. Throughout the procedure, the patients blood pressure, pulse and oxygen saturations were monitored continuously.  Under direct visualization, the Pentax Radial EUS [id number]  endoscope was introduced through the mouth  and advanced to the second portion of the duodenum .  Water was used as necessary to provide an acoustic interface.  Upon completion of the imaging, water was removed and the patient was sent to the recovery room in satisfactory condition.  Endoscopic findings: 1. There was severe, ulcerative esophagitis at the GE junction. There was not clearly neoplastic mucosa associated but I took several biopsies and sent to pathology. 2. Medium sized (3-4cm) hiatal hernia 3. Otherwise normal UGI tract EUS findings: 1. Echolayering of the distal esophagus was normal, no clear masss. 2. There was a single, well demarcated, round 1.1cm lympnode adjacent to the GE junction. This was sampled with 2 transesophageal passes with a 25 guage EUS FNA needle. 3. Subcarinal adenopathy appeared reactive. 4. No celiac  adenopathy. Impression: Severe, ulcerative esophagitis above a medium sized hiatal hernia. My suspicion for GE junction neoplasm is low, but mucosal biopsies were taken and I sampled a round 1.1cm paraesophageal lymphnode. Preliminary cytology review (of the LN) shows only reactive features.  Await final pathology, cytology.  I will start him on twice daily PPI for his GERD.  _______________________________ eSigned:  Milus Banister, MD 07/18/2013 1:28 PM  cc: Zenovia Jarred, MD

## 2013-07-18 NOTE — Preoperative (Signed)
Beta Blockers   Reason not to administer Beta Blockers:Not Applicable 

## 2013-07-19 ENCOUNTER — Encounter (HOSPITAL_COMMUNITY): Payer: Self-pay | Admitting: Gastroenterology

## 2013-07-19 ENCOUNTER — Telehealth: Payer: Self-pay | Admitting: Gastroenterology

## 2013-07-19 NOTE — Telephone Encounter (Signed)
Per [employer] No prior auth needed

## 2013-07-19 NOTE — Telephone Encounter (Signed)
This is a Dr Hilarie Fredrickson patient.

## 2013-07-19 NOTE — Telephone Encounter (Signed)
Pt had an EUS yesterday and Dr Ardis Hughs ordered Omeprazole for pt. He needs a prior [place]. Called the pharmacy and was advised to call [phone]. Christian Mate, CMA will check on it. Thanks

## 2013-07-22 NOTE — Telephone Encounter (Signed)
Pt will take the OTC he says it is working "fine"  Pt will call with any changes

## 2013-07-23 ENCOUNTER — Telehealth (INDEPENDENT_AMBULATORY_CARE_PROVIDER_SITE_OTHER): Payer: Self-pay | Admitting: General Surgery

## 2013-07-23 NOTE — Telephone Encounter (Signed)
Called to discuss finalizing plans for surgery.  Left message to call back.  If he calls back and I am not available, please get a time that he would be free for me to call him.

## 2013-07-24 ENCOUNTER — Telehealth: Payer: Self-pay | Admitting: Emergency Medicine

## 2013-07-24 ENCOUNTER — Other Ambulatory Visit (INDEPENDENT_AMBULATORY_CARE_PROVIDER_SITE_OTHER): Payer: Self-pay | Admitting: General Surgery

## 2013-07-24 ENCOUNTER — Telehealth: Payer: Self-pay | Admitting: Internal Medicine

## 2013-07-24 ENCOUNTER — Telehealth (INDEPENDENT_AMBULATORY_CARE_PROVIDER_SITE_OTHER): Payer: Self-pay | Admitting: General Surgery

## 2013-07-24 DIAGNOSIS — Z01818 Encounter for other preprocedural examination: Secondary | ICD-10-CM

## 2013-07-24 MED ORDER — METRONIDAZOLE 500 MG PO TABS: 500.0000 mg | ORAL_TABLET | ORAL | Status: DC

## 2013-07-24 NOTE — Telephone Encounter (Signed)
Pt calling because confused about Vitamin D script for 50,000 units.  He is unsure if its OTC or a script and when he went to pharmacist at [facility] they said they had not received script for Vitamin D.  Pt uses Product/process development scientist at [facility].

## 2013-07-24 NOTE — Telephone Encounter (Signed)
Left message for pt to call clinic

## 2013-07-24 NOTE — Telephone Encounter (Signed)
Pt called back was transferred to surgery scheduling.  See note that he was to speak with Dr Marcello Moores  # [phone]

## 2013-07-24 NOTE — Telephone Encounter (Signed)
Called pt and discussed surgery with him.  We discussed performing a total abdominal colectomy instead of a proctocolectomy with J-pouch. I think this is reasonable given he has minimal disease in his rectum and his age may limit his ability to handle the J-pouch. We would also avoid potential pelvic nerve injuries associated with proctectomy. He elected to have a total abdominal colectomy with ileorectal anastomosis. I will have my office to set this up for him.

## 2013-07-25 ENCOUNTER — Telehealth (INDEPENDENT_AMBULATORY_CARE_PROVIDER_SITE_OTHER): Payer: Self-pay

## 2013-07-25 ENCOUNTER — Telehealth (INDEPENDENT_AMBULATORY_CARE_PROVIDER_SITE_OTHER): Payer: Self-pay | Admitting: General Surgery

## 2013-07-25 NOTE — Telephone Encounter (Signed)
Called pt to notify him that his surgical orders were turned into surgery scheduling today. I have mailed him Dr Manon Hilding colon prep instructions today in the mail and Flagyl rx sent to the pharmacy for the prep. The pt understands.

## 2013-07-25 NOTE — Telephone Encounter (Signed)
Pt called to ask about his Flagyl prescription.  It is part of his bowel prep.  Explained this and will mail his bowel prep instructions today.  He understands to hold the Flagyl until then and will call back for any questions.

## 2013-08-06 ENCOUNTER — Ambulatory Visit: Payer: Medicaid Other | Admitting: Internal Medicine

## 2013-08-11 HISTORY — PX: COLON SURGERY: SHX602

## 2013-08-13 ENCOUNTER — Other Ambulatory Visit: Payer: Self-pay | Admitting: *Deleted

## 2013-08-13 ENCOUNTER — Encounter: Payer: Medicare HMO | Admitting: Oncology

## 2013-08-15 ENCOUNTER — Encounter (HOSPITAL_COMMUNITY): Payer: Self-pay | Admitting: Pharmacy Technician

## 2013-08-19 ENCOUNTER — Encounter (HOSPITAL_COMMUNITY): Payer: Self-pay | Admitting: *Deleted

## 2013-08-19 ENCOUNTER — Inpatient Hospital Stay (HOSPITAL_COMMUNITY): Admission: RE | Admit: 2013-08-19 | Payer: Medicare HMO | Source: Ambulatory Visit

## 2013-08-19 ENCOUNTER — Encounter (HOSPITAL_COMMUNITY)
Admission: RE | Admit: 2013-08-19 | Discharge: 2013-08-19 | Disposition: A | Discharge status: Home / Self Care | Payer: Medicare HMO | Source: Ambulatory Visit | Attending: General Surgery | Admitting: General Surgery

## 2013-08-19 ENCOUNTER — Telehealth: Payer: Self-pay | Admitting: Oncology

## 2013-08-19 DIAGNOSIS — Z01812 Encounter for preprocedural laboratory examination: Secondary | ICD-10-CM | POA: Diagnosis not present

## 2013-08-19 LAB — CBC
HEMATOCRIT: 41.3 % (ref 39.0–52.0)
Hemoglobin: 13.9 g/dL (ref 13.0–17.0)
MCH: 28.9 pg (ref 26.0–34.0)
MCHC: 33.7 g/dL (ref 30.0–36.0)
MCV: 85.9 fL (ref 78.0–100.0)
Platelets: 329 10*3/uL (ref 150–400)
RBC: 4.81 MIL/uL (ref 4.22–5.81)
RDW: 13.3 % (ref 11.5–15.5)
WBC: 6.1 10*3/uL (ref 4.0–10.5)

## 2013-08-19 LAB — BASIC METABOLIC PANEL
BUN: 10 mg/dL (ref 6–23)
CHLORIDE: 99 meq/L (ref 96–112)
CO2: 25 meq/L (ref 19–32)
CREATININE: 0.93 mg/dL (ref 0.50–1.35)
Calcium: 9.4 mg/dL (ref 8.4–10.5)
GFR calc Af Amer: >90 mL/min (ref >90)
GFR calc non Af Amer: 84 mL/min — ABNORMAL LOW (ref >90)
GLUCOSE: 83 mg/dL (ref 70–99)
Potassium: 4.1 mEq/L (ref 3.7–5.3)
Sodium: 137 mEq/L (ref 137–147)

## 2013-08-19 LAB — ABO/RH: ABO/RH(D): O POS

## 2013-08-19 NOTE — Pre-Procedure Instructions (Addendum)
08-19-13  CT Chest-1'15 Epic

## 2013-08-19 NOTE — Patient Instructions (Addendum)
08/19/2013   Your procedure is scheduled on:   08-23-2013  Report to [place] at      [place]  AM .  Call this number if you have problems the morning of surgery: [phone]  Or Presurgical Testing [phone](Yoshi Vicencio) For Living Will and/or Health Care Power Attorney Forms: please provide copy for your medical record,may bring AM of surgery(Forms should be already notarized -we do not provide this service).(08-19-13 No desire to have further information at this time.)  Remember: Follow any bowel prep instructions per MD office. For Cpap use: Bring mask and tubing only.   Do not eat food:After Midnight.    Take these medicines the morning of surgery with A SIP OF WATER: Famotodine(PEPCID)   Do not wear jewelry, make-up or nail polish.  Do not wear lotions, powders, or perfumes. You may wear deodorant.  Do not shave 48 hours(2 days) prior to first CHG shower(legs and under arms).(Shaving face and neck okay.)  Do not bring valuables to the hospital.(Hospital is not responsible for lost valuables).  Contacts, dentures or removable bridgework, body piercing, hair pins may not be worn into surgery.  Leave suitcase in the car. After surgery it may be brought to your room.  For patients admitted to the hospital, checkout time is 11:00 AM the day of discharge.(Restricted visitors-Any Persons displaying flu-like symptoms or illness).    Patients discharged the day of surgery will not be allowed to drive home. Must have responsible person with you x 24 hours once discharged.  Name and phone number of your driver: Alastor Kneale [phone] cell  Special Instructions: CHG(Chlorhedine 4%-"Hibiclens","Betasept","Aplicare") Shower Use Special Wash: see special instructions.(avoid face and genitals)   Please read over the following fact sheets that you were given:  Blood Transfusion fact sheet, Incentive Spirometry Instruction.  Remember : Type/Screen "Blue armbands" - may not be  removed once applied(would result in being retested AM of surgery, if removed).  Failure to follow these instructions may result in Cancellation of your surgery.   Patient signature_______________________________________________________

## 2013-08-19 NOTE — Pre-Procedure Instructions (Signed)
Robert Rhodes  08/19/2013   Your procedure is scheduled on:   08-23-2013  Report to [facility] at      [address]  AM .  Call this number if you have problems the morning of surgery: [phone]  Or Presurgical Testing [phone](Tanasia Budzinski) For Living Will and/or Health Care Power Attorney Forms: please provide copy for your medical record,may bring AM of surgery(Forms should be already notarized -we do not provide this service).  Remember: Follow any bowel prep instructions per MD office. For Cpap use: Bring mask and tubing only.   Do not eat food:After Midnight.    Take these medicines the morning of surgery with A SIP OF WATER: Famotodine(PEPCID)   Do not wear jewelry, make-up or nail polish.  Do not wear lotions, powders, or perfumes. You may wear deodorant.  Do not shave 48 hours(2 days) prior to first CHG shower(legs and under arms).(Shaving face and neck okay.)  Do not bring valuables to the hospital.(Hospital is not responsible for lost valuables).  Contacts, dentures or removable bridgework, body piercing, hair pins may not be worn into surgery.  Leave suitcase in the car. After surgery it may be brought to your room.  For patients admitted to the hospital, checkout time is 11:00 AM the day of discharge.(Restricted visitors-Any Persons displaying flu-like symptoms or illness).    Patients discharged the day of surgery will not be allowed to drive home. Must have responsible person with you x 24 hours once discharged.  Name and phone number of your driver:   Special Instructions: CHG(Chlorhedine 4%-"Hibiclens","Betasept","Aplicare") Shower Use Special Wash: see special instructions.(avoid face and genitals)   Please read over the following fact sheets that you were given: MRSA Information, Blood Transfusion fact sheet, Incentive Spirometry Instruction.  Remember : Type/Screen "Blue armbands" - may not be removed once applied(would result in being retested AM of  surgery, if removed).  Failure to follow these instructions may result in Cancellation of your surgery.   Patient signature_______________________________________________________

## 2013-08-19 NOTE — Telephone Encounter (Signed)
s/w pt re appt for 4/1 @ 11am. d/t per BS via staff message.

## 2013-08-23 ENCOUNTER — Inpatient Hospital Stay (HOSPITAL_COMMUNITY): Payer: Medicare HMO | Admitting: Anesthesiology

## 2013-08-23 ENCOUNTER — Encounter (HOSPITAL_COMMUNITY)
Admission: RE | Disposition: A | Discharge status: Home / Self Care | Payer: Self-pay | Source: Ambulatory Visit | Attending: General Surgery

## 2013-08-23 ENCOUNTER — Inpatient Hospital Stay (HOSPITAL_COMMUNITY)
Admission: RE | Admit: 2013-08-23 | Discharge: 2013-09-03 | DRG: 330 | Disposition: A | Discharge status: Home / Self Care | Payer: Medicare HMO | Source: Ambulatory Visit | Attending: General Surgery | Admitting: General Surgery

## 2013-08-23 ENCOUNTER — Encounter (HOSPITAL_COMMUNITY): Payer: Medicare HMO | Admitting: Anesthesiology

## 2013-08-23 ENCOUNTER — Encounter (HOSPITAL_COMMUNITY): Payer: Self-pay | Admitting: *Deleted

## 2013-08-23 DIAGNOSIS — C189 Malignant neoplasm of colon, unspecified: Secondary | ICD-10-CM | POA: Diagnosis present

## 2013-08-23 DIAGNOSIS — Z87891 Personal history of nicotine dependence: Secondary | ICD-10-CM

## 2013-08-23 DIAGNOSIS — E559 Vitamin D deficiency, unspecified: Secondary | ICD-10-CM | POA: Diagnosis present

## 2013-08-23 DIAGNOSIS — Y836 Removal of other organ (partial) (total) as the cause of abnormal reaction of the patient, or of later complication, without mention of misadventure at the time of the procedure: Secondary | ICD-10-CM | POA: Diagnosis not present

## 2013-08-23 DIAGNOSIS — K560 Paralytic ileus: Secondary | ICD-10-CM | POA: Diagnosis not present

## 2013-08-23 DIAGNOSIS — M129 Arthropathy, unspecified: Secondary | ICD-10-CM | POA: Diagnosis present

## 2013-08-23 DIAGNOSIS — C180 Malignant neoplasm of cecum: Principal | ICD-10-CM | POA: Diagnosis present

## 2013-08-23 DIAGNOSIS — R066 Hiccough: Secondary | ICD-10-CM | POA: Diagnosis not present

## 2013-08-23 DIAGNOSIS — Z79899 Other long term (current) drug therapy: Secondary | ICD-10-CM

## 2013-08-23 DIAGNOSIS — K209 Esophagitis, unspecified without bleeding: Secondary | ICD-10-CM | POA: Diagnosis present

## 2013-08-23 DIAGNOSIS — Z809 Family history of malignant neoplasm, unspecified: Secondary | ICD-10-CM

## 2013-08-23 DIAGNOSIS — K436 Other and unspecified ventral hernia with obstruction, without gangrene: Secondary | ICD-10-CM | POA: Diagnosis present

## 2013-08-23 DIAGNOSIS — Z888 Allergy status to other drugs, medicaments and biological substances status: Secondary | ICD-10-CM

## 2013-08-23 DIAGNOSIS — E876 Hypokalemia: Secondary | ICD-10-CM | POA: Diagnosis not present

## 2013-08-23 DIAGNOSIS — K929 Disease of digestive system, unspecified: Secondary | ICD-10-CM | POA: Diagnosis not present

## 2013-08-23 DIAGNOSIS — Z01812 Encounter for preprocedural laboratory examination: Secondary | ICD-10-CM

## 2013-08-23 DIAGNOSIS — Z886 Allergy status to analgesic agent status: Secondary | ICD-10-CM

## 2013-08-23 DIAGNOSIS — M810 Age-related osteoporosis without current pathological fracture: Secondary | ICD-10-CM | POA: Diagnosis present

## 2013-08-23 DIAGNOSIS — K5190 Ulcerative colitis, unspecified, without complications: Secondary | ICD-10-CM | POA: Diagnosis present

## 2013-08-23 HISTORY — PX: COLON RESECTION: SHX5231

## 2013-08-23 LAB — TYPE AND SCREEN
ABO/RH(D): O POS
Antibody Screen: NEGATIVE

## 2013-08-23 SURGERY — COLON RESECTION LAPAROSCOPIC
Anesthesia: General | Site: Abdomen

## 2013-08-23 MED ORDER — MIDAZOLAM HCL 2 MG/2ML IJ SOLN
INTRAMUSCULAR | Status: AC
  Filled 2013-08-23: qty 2

## 2013-08-23 MED ORDER — BUPIVACAINE-EPINEPHRINE PF 0.25-1:200000 % IJ SOLN
INTRAMUSCULAR | Status: AC
  Filled 2013-08-23: qty 30

## 2013-08-23 MED ORDER — CEFOTETAN DISODIUM-DEXTROSE 2-2.08 GM-% IV SOLR
INTRAVENOUS | Status: AC
  Filled 2013-08-23: qty 50

## 2013-08-23 MED ORDER — EPHEDRINE SULFATE 50 MG/ML IJ SOLN
INTRAMUSCULAR | Status: AC
  Filled 2013-08-23: qty 1

## 2013-08-23 MED ORDER — ALVIMOPAN 12 MG PO CAPS
12.0000 mg | ORAL_CAPSULE | Freq: Once | ORAL | Status: AC
  Administered 2013-08-23: 12 mg via ORAL
  Filled 2013-08-23: qty 1

## 2013-08-23 MED ORDER — MORPHINE SULFATE (PF) 1 MG/ML IV SOLN
INTRAVENOUS | Status: DC
  Administered 2013-08-23: 14:00:00 via INTRAVENOUS
  Administered 2013-08-23 (×2): 6 mg via INTRAVENOUS
  Administered 2013-08-24: 10.5 mg via INTRAVENOUS
  Administered 2013-08-24: 20:00:00 via INTRAVENOUS
  Administered 2013-08-24: 6 mg via INTRAVENOUS
  Administered 2013-08-24: 3 mg via INTRAVENOUS
  Administered 2013-08-24: 6 mg via INTRAVENOUS
  Administered 2013-08-24: 7.5 mg via INTRAVENOUS
  Administered 2013-08-24: 08:00:00 via INTRAVENOUS
  Administered 2013-08-24: 4.5 mg via INTRAVENOUS
  Administered 2013-08-25: 16:00:00 via INTRAVENOUS
  Administered 2013-08-25: 13.5 mg via INTRAVENOUS
  Administered 2013-08-25: 106.3 mg via INTRAVENOUS
  Administered 2013-08-25: 6 mg via INTRAVENOUS
  Administered 2013-08-25: 7.5 mg via INTRAVENOUS
  Administered 2013-08-25: 13.5 mg via INTRAVENOUS
  Administered 2013-08-26: 3.5 mg via INTRAVENOUS
  Administered 2013-08-26: 9 mg via INTRAVENOUS
  Administered 2013-08-26: 6 mg via INTRAVENOUS
  Administered 2013-08-26: 4.5 mg via INTRAVENOUS
  Administered 2013-08-26: 03:00:00 via INTRAVENOUS
  Administered 2013-08-26: 15 mg via INTRAVENOUS
  Administered 2013-08-27 (×2): 3 mg via INTRAVENOUS
  Administered 2013-08-27: 1.5 mg via INTRAVENOUS
  Administered 2013-08-27 (×2): 4.5 mg via INTRAVENOUS
  Administered 2013-08-28: 0.896 mg via INTRAVENOUS
  Administered 2013-08-28: 6 mg via INTRAVENOUS
  Administered 2013-08-28: 1.5 mg via INTRAVENOUS
  Administered 2013-08-28: 25 mg via INTRAVENOUS
  Administered 2013-08-28: 3 mg via INTRAVENOUS
  Administered 2013-08-29: 09:00:00 via INTRAVENOUS
  Administered 2013-08-29: 16.2 mg via INTRAVENOUS
  Administered 2013-08-29: 15 mg via INTRAVENOUS
  Administered 2013-08-29: 4.5 mg via INTRAVENOUS
  Administered 2013-08-29: 19.5 mg via INTRAVENOUS
  Administered 2013-08-29: 10.44 mg via INTRAVENOUS
  Administered 2013-08-30: 1.5 mg via INTRAVENOUS
  Administered 2013-08-30: 41.78 mg via INTRAVENOUS
  Filled 2013-08-23 (×11): qty 25

## 2013-08-23 MED ORDER — BUPIVACAINE-EPINEPHRINE 0.5% -1:200000 IJ SOLN
INTRAMUSCULAR | Status: DC | PRN
  Administered 2013-08-23: 25 mL

## 2013-08-23 MED ORDER — DEXAMETHASONE SODIUM PHOSPHATE 10 MG/ML IJ SOLN
INTRAMUSCULAR | Status: AC
  Filled 2013-08-23: qty 1

## 2013-08-23 MED ORDER — LACTATED RINGERS IV SOLN
INTRAVENOUS | Status: DC
  Administered 2013-08-24: 02:00:00 via INTRAVENOUS

## 2013-08-23 MED ORDER — GLYCOPYRROLATE 0.2 MG/ML IJ SOLN
INTRAMUSCULAR | Status: AC
  Filled 2013-08-23: qty 3

## 2013-08-23 MED ORDER — DEXAMETHASONE SODIUM PHOSPHATE 10 MG/ML IJ SOLN
INTRAMUSCULAR | Status: DC | PRN
  Administered 2013-08-23: 10 mg via INTRAVENOUS

## 2013-08-23 MED ORDER — SODIUM CHLORIDE 0.9 % IJ SOLN: 9.0000 mL | INTRAMUSCULAR | Status: DC | PRN

## 2013-08-23 MED ORDER — MORPHINE SULFATE (PF) 1 MG/ML IV SOLN
INTRAVENOUS | Status: AC
  Filled 2013-08-23: qty 25

## 2013-08-23 MED ORDER — NEOSTIGMINE METHYLSULFATE 1 MG/ML IJ SOLN
INTRAMUSCULAR | Status: AC
  Filled 2013-08-23: qty 10

## 2013-08-23 MED ORDER — SUCCINYLCHOLINE CHLORIDE 20 MG/ML IJ SOLN
INTRAMUSCULAR | Status: DC | PRN
  Administered 2013-08-23: 100 mg via INTRAVENOUS

## 2013-08-23 MED ORDER — FENTANYL CITRATE 0.05 MG/ML IJ SOLN
INTRAMUSCULAR | Status: AC
  Filled 2013-08-23: qty 2

## 2013-08-23 MED ORDER — MIDAZOLAM HCL 5 MG/5ML IJ SOLN
INTRAMUSCULAR | Status: DC | PRN
  Administered 2013-08-23 (×2): 1 mg via INTRAVENOUS

## 2013-08-23 MED ORDER — PROMETHAZINE HCL 25 MG/ML IJ SOLN
6.2500 mg | INTRAMUSCULAR | Status: DC | PRN
  Administered 2013-08-23: 6.25 mg via INTRAVENOUS

## 2013-08-23 MED ORDER — ONDANSETRON HCL 4 MG/2ML IJ SOLN
INTRAMUSCULAR | Status: DC | PRN
  Administered 2013-08-23: 4 mg via INTRAVENOUS

## 2013-08-23 MED ORDER — ONDANSETRON HCL 4 MG/2ML IJ SOLN
4.0000 mg | Freq: Four times a day (QID) | INTRAMUSCULAR | Status: DC | PRN
  Administered 2013-08-26 – 2013-08-29 (×4): 4 mg via INTRAVENOUS
  Filled 2013-08-23 (×4): qty 2

## 2013-08-23 MED ORDER — LACTATED RINGERS IV SOLN
INTRAVENOUS | Status: DC | PRN
  Administered 2013-08-23 (×4): via INTRAVENOUS

## 2013-08-23 MED ORDER — DIPHENHYDRAMINE HCL 50 MG/ML IJ SOLN: 12.5000 mg | Freq: Four times a day (QID) | INTRAMUSCULAR | Status: DC | PRN

## 2013-08-23 MED ORDER — LACTATED RINGERS IR SOLN
Status: DC | PRN
  Administered 2013-08-23: 2000 mL

## 2013-08-23 MED ORDER — DEXTROSE 5 % IV SOLN
2.0000 g | INTRAVENOUS | Status: AC
  Administered 2013-08-23: 2 g via INTRAVENOUS

## 2013-08-23 MED ORDER — HEPARIN SODIUM (PORCINE) 5000 UNIT/ML IJ SOLN
5000.0000 [IU] | Freq: Once | INTRAMUSCULAR | Status: AC
  Administered 2013-08-23: 5000 [IU] via SUBCUTANEOUS
  Filled 2013-08-23: qty 1

## 2013-08-23 MED ORDER — NALOXONE HCL 0.4 MG/ML IJ SOLN: 0.4000 mg | INTRAMUSCULAR | Status: DC | PRN

## 2013-08-23 MED ORDER — SODIUM CHLORIDE 0.9 % IJ SOLN
INTRAMUSCULAR | Status: AC
Start: 2013-08-23 — End: 2013-08-23
  Filled 2013-08-23: qty 10

## 2013-08-23 MED ORDER — ENOXAPARIN SODIUM 40 MG/0.4ML ~~LOC~~ SOLN
40.0000 mg | SUBCUTANEOUS | Status: DC
  Administered 2013-08-24 – 2013-09-03 (×11): 40 mg via SUBCUTANEOUS
  Filled 2013-08-23 (×13): qty 0.4

## 2013-08-23 MED ORDER — SULFASALAZINE 500 MG PO TABS
500.0000 mg | ORAL_TABLET | Freq: Four times a day (QID) | ORAL | Status: DC
  Administered 2013-08-23 (×2): 500 mg via ORAL
  Filled 2013-08-23 (×46): qty 1

## 2013-08-23 MED ORDER — PROPOFOL 10 MG/ML IV BOLUS
INTRAVENOUS | Status: DC | PRN
  Administered 2013-08-23: 150 mg via INTRAVENOUS

## 2013-08-23 MED ORDER — 0.9 % SODIUM CHLORIDE (POUR BTL) OPTIME
TOPICAL | Status: DC | PRN
  Administered 2013-08-23: 3000 mL

## 2013-08-23 MED ORDER — DIPHENHYDRAMINE HCL 12.5 MG/5ML PO ELIX: 12.5000 mg | ORAL_SOLUTION | Freq: Four times a day (QID) | ORAL | Status: DC | PRN

## 2013-08-23 MED ORDER — GLYCOPYRROLATE 0.2 MG/ML IJ SOLN
INTRAMUSCULAR | Status: DC | PRN
  Administered 2013-08-23: 0.6 mg via INTRAVENOUS

## 2013-08-23 MED ORDER — NEOSTIGMINE METHYLSULFATE 1 MG/ML IJ SOLN
INTRAMUSCULAR | Status: DC | PRN
  Administered 2013-08-23: 4 mg via INTRAVENOUS

## 2013-08-23 MED ORDER — FENTANYL CITRATE 0.05 MG/ML IJ SOLN
25.0000 ug | INTRAMUSCULAR | Status: DC | PRN
  Administered 2013-08-23: 25 ug via INTRAVENOUS

## 2013-08-23 MED ORDER — HYDROMORPHONE HCL PF 1 MG/ML IJ SOLN
INTRAMUSCULAR | Status: AC
  Filled 2013-08-23: qty 1

## 2013-08-23 MED ORDER — FENTANYL CITRATE 0.05 MG/ML IJ SOLN
INTRAMUSCULAR | Status: AC
Start: 2013-08-23 — End: 2013-08-23
  Filled 2013-08-23: qty 2

## 2013-08-23 MED ORDER — LACTATED RINGERS IV SOLN: INTRAVENOUS | Status: DC

## 2013-08-23 MED ORDER — PROPOFOL 10 MG/ML IV BOLUS
INTRAVENOUS | Status: AC
  Filled 2013-08-23: qty 20

## 2013-08-23 MED ORDER — EPHEDRINE SULFATE 50 MG/ML IJ SOLN
INTRAMUSCULAR | Status: DC | PRN
  Administered 2013-08-23: 10 mg via INTRAVENOUS

## 2013-08-23 MED ORDER — ONDANSETRON HCL 4 MG/2ML IJ SOLN
INTRAMUSCULAR | Status: AC
  Filled 2013-08-23: qty 2

## 2013-08-23 MED ORDER — ROCURONIUM BROMIDE 100 MG/10ML IV SOLN
INTRAVENOUS | Status: AC
  Filled 2013-08-23: qty 1

## 2013-08-23 MED ORDER — FENTANYL CITRATE 0.05 MG/ML IJ SOLN
INTRAMUSCULAR | Status: DC | PRN
  Administered 2013-08-23 (×3): 50 ug via INTRAVENOUS
  Administered 2013-08-23: 25 ug via INTRAVENOUS
  Administered 2013-08-23 (×3): 50 ug via INTRAVENOUS
  Administered 2013-08-23: 25 ug via INTRAVENOUS
  Administered 2013-08-23 (×4): 50 ug via INTRAVENOUS

## 2013-08-23 MED ORDER — ROCURONIUM BROMIDE 100 MG/10ML IV SOLN
INTRAVENOUS | Status: DC | PRN
  Administered 2013-08-23: 40 mg via INTRAVENOUS
  Administered 2013-08-23: 20 mg via INTRAVENOUS
  Administered 2013-08-23: 5 mg via INTRAVENOUS
  Administered 2013-08-23 (×3): 10 mg via INTRAVENOUS

## 2013-08-23 MED ORDER — PROMETHAZINE HCL 25 MG/ML IJ SOLN
INTRAMUSCULAR | Status: AC
  Filled 2013-08-23: qty 1

## 2013-08-23 MED ORDER — LACTATED RINGERS IV SOLN
INTRAVENOUS | Status: DC
  Administered 2013-08-23: 14:00:00 via INTRAVENOUS

## 2013-08-23 MED ORDER — FAMOTIDINE 20 MG PO TABS
20.0000 mg | ORAL_TABLET | Freq: Two times a day (BID) | ORAL | Status: DC
  Administered 2013-08-23 – 2013-09-03 (×11): 20 mg via ORAL
  Filled 2013-08-23 (×24): qty 1

## 2013-08-23 MED ORDER — BUPIVACAINE-EPINEPHRINE PF 0.5-1:200000 % IJ SOLN
INTRAMUSCULAR | Status: AC
  Filled 2013-08-23: qty 30

## 2013-08-23 MED ORDER — HYDROMORPHONE HCL PF 1 MG/ML IJ SOLN
0.2500 mg | INTRAMUSCULAR | Status: DC | PRN
  Administered 2013-08-23 (×4): 0.5 mg via INTRAVENOUS

## 2013-08-23 MED ORDER — FENTANYL CITRATE 0.05 MG/ML IJ SOLN
INTRAMUSCULAR | Status: AC
  Filled 2013-08-23: qty 5

## 2013-08-23 MED ORDER — DEXTROSE 5 % IV SOLN
2.0000 g | Freq: Two times a day (BID) | INTRAVENOUS | Status: AC
  Administered 2013-08-23: 2 g via INTRAVENOUS
  Filled 2013-08-23: qty 2

## 2013-08-23 SURGICAL SUPPLY — 78 items
APPLICATOR COTTON TIP 6IN STRL (MISCELLANEOUS) ×3 IMPLANT
APPLIER CLIP 5 13 M/L LIGAMAX5 (MISCELLANEOUS) ×3
BLADE EXTENDED COATED 6.5IN (ELECTRODE) ×3 IMPLANT
BLADE HEX COATED 2.75 (ELECTRODE) ×6 IMPLANT
BLADE SURG SZ10 CARB STEEL (BLADE) ×3 IMPLANT
CABLE HIGH FREQUENCY MONO STRZ (ELECTRODE) ×3 IMPLANT
CANISTER SUCTION 2500CC (MISCELLANEOUS) ×3 IMPLANT
CELLS DAT CNTRL 66122 CELL SVR (MISCELLANEOUS) IMPLANT
CHLORAPREP W/TINT 26ML (MISCELLANEOUS) ×3 IMPLANT
CLIP APPLIE 5 13 M/L LIGAMAX5 (MISCELLANEOUS) ×1 IMPLANT
COVER MAYO STAND STRL (DRAPES) ×6 IMPLANT
DECANTER SPIKE VIAL GLASS SM (MISCELLANEOUS) ×3 IMPLANT
DERMABOND ADVANCED (GAUZE/BANDAGES/DRESSINGS) ×2
DERMABOND ADVANCED .7 DNX12 (GAUZE/BANDAGES/DRESSINGS) ×1 IMPLANT
DRAIN CHANNEL 19F RND (DRAIN) IMPLANT
DRAPE LAPAROSCOPIC ABDOMINAL (DRAPES) ×3 IMPLANT
DRAPE LG THREE QUARTER DISP (DRAPES) IMPLANT
DRAPE UTILITY 15X26 (DRAPE) ×6 IMPLANT
DRAPE WARM FLUID 44X44 (DRAPE) ×3 IMPLANT
ELECT REM PT RETURN 9FT ADLT (ELECTROSURGICAL) ×3
ELECTRODE REM PT RTRN 9FT ADLT (ELECTROSURGICAL) ×1 IMPLANT
EVACUATOR SILICONE 100CC (DRAIN) ×3 IMPLANT
GLOVE BIO SURGEON STRL SZ 6.5 (GLOVE) ×6 IMPLANT
GLOVE BIO SURGEONS STRL SZ 6.5 (GLOVE) ×3
GLOVE BIOGEL PI IND STRL 7.0 (GLOVE) ×2 IMPLANT
GLOVE BIOGEL PI INDICATOR 7.0 (GLOVE) ×4
GOWN SPEC L3 XXLG W/TWL (GOWN DISPOSABLE) ×6 IMPLANT
GOWN STRL REUS W/TWL XL LVL3 (GOWN DISPOSABLE) ×12 IMPLANT
HANDLE STAPLE EGIA 4 XL (STAPLE) ×3 IMPLANT
KIT BASIN OR (CUSTOM PROCEDURE TRAY) ×3 IMPLANT
LEGGING LITHOTOMY PAIR STRL (DRAPES) ×3 IMPLANT
LIGASURE IMPACT 36 18CM CVD LR (INSTRUMENTS) IMPLANT
NS IRRIG 1000ML POUR BTL (IV SOLUTION) ×3 IMPLANT
PENCIL BUTTON HOLSTER BLD 10FT (ELECTRODE) ×6 IMPLANT
RELOAD EGIA 45 MED/THCK PURPLE (STAPLE) ×3 IMPLANT
RELOAD EGIA 60 MED/THCK PURPLE (STAPLE) ×12 IMPLANT
RTRCTR WOUND ALEXIS 18CM MED (MISCELLANEOUS)
SCISSORS LAP 5X35 DISP (ENDOMECHANICALS) ×3 IMPLANT
SEALER TISSUE G2 CVD JAW 35 (ENDOMECHANICALS) ×1 IMPLANT
SEALER TISSUE G2 CVD JAW 45CM (ENDOMECHANICALS) ×2
SET IRRIG TUBING LAPAROSCOPIC (IRRIGATION / IRRIGATOR) ×3 IMPLANT
SLEEVE XCEL OPT CAN 5 100 (ENDOMECHANICALS) ×6 IMPLANT
SOLUTION ANTI FOG 6CC (MISCELLANEOUS) ×3 IMPLANT
SPONGE GAUZE 4X4 12PLY (GAUZE/BANDAGES/DRESSINGS) ×3 IMPLANT
SPONGE LAP 18X18 X RAY DECT (DISPOSABLE) ×9 IMPLANT
STAPLER CIRC CVD 29MM 37CM (STAPLE) ×3 IMPLANT
STAPLER CIRC ILS CVD 25MM (STAPLE) ×3 IMPLANT
STAPLER VISISTAT 35W (STAPLE) ×3 IMPLANT
SUCTION POOLE TIP (SUCTIONS) ×3 IMPLANT
SUT ETHILON 2 0 PS N (SUTURE) ×3 IMPLANT
SUT NOVA NAB DX-16 0-1 5-0 T12 (SUTURE) ×9 IMPLANT
SUT PDS AB 1 CTX 36 (SUTURE) ×6 IMPLANT
SUT PDS AB 1 TP1 96 (SUTURE) IMPLANT
SUT PROLENE 2 0 BLUE (SUTURE) ×3 IMPLANT
SUT PROLENE 2 0 KS (SUTURE) IMPLANT
SUT SILK 2 0 (SUTURE) ×2
SUT SILK 2 0 SH (SUTURE) ×6 IMPLANT
SUT SILK 2 0 SH CR/8 (SUTURE) ×6 IMPLANT
SUT SILK 2-0 18XBRD TIE 12 (SUTURE) ×1 IMPLANT
SUT SILK 3 0 (SUTURE) ×2
SUT SILK 3 0 SH CR/8 (SUTURE) ×3 IMPLANT
SUT SILK 3-0 18XBRD TIE 12 (SUTURE) ×1 IMPLANT
SUT VIC AB 2-0 SH 18 (SUTURE) IMPLANT
SUT VIC AB 4-0 PS2 27 (SUTURE) ×9 IMPLANT
SUT VICRYL 2 0 18  UND BR (SUTURE)
SUT VICRYL 2 0 18 UND BR (SUTURE) IMPLANT
SYS LAPSCP GELPORT 120MM (MISCELLANEOUS) ×3
SYSTEM LAPSCP GELPORT 120MM (MISCELLANEOUS) ×1 IMPLANT
TOWEL OR 17X26 10 PK STRL BLUE (TOWEL DISPOSABLE) ×6 IMPLANT
TOWEL OR NON WOVEN STRL DISP B (DISPOSABLE) ×6 IMPLANT
TRAY FOLEY CATH 14FRSI W/METER (CATHETERS) ×3 IMPLANT
TRAY LAP CHOLE (CUSTOM PROCEDURE TRAY) ×3 IMPLANT
TROCAR BLADELESS OPT 5 100 (ENDOMECHANICALS) ×3 IMPLANT
TROCAR XCEL 12X100 BLDLESS (ENDOMECHANICALS) ×3 IMPLANT
TROCAR XCEL BLUNT TIP 100MML (ENDOMECHANICALS) ×3 IMPLANT
TUBING FILTER THERMOFLATOR (ELECTROSURGICAL) ×3 IMPLANT
YANKAUER SUCT BULB TIP 10FT TU (MISCELLANEOUS) ×6 IMPLANT
YANKAUER SUCT BULB TIP NO VENT (SUCTIONS) ×3 IMPLANT

## 2013-08-23 NOTE — Transfer of Care (Signed)
Immediate Anesthesia Transfer of Care Note  Patient: Robert Rhodes  Procedure(s) Performed: Procedure(s): Laparoscopic total abdominal colectomy and hernia repair (N/A)  Patient Location: PACU  Anesthesia Type:General  Level of Consciousness: awake, alert  and patient cooperative  Airway & Oxygen Therapy: Patient Spontanous Breathing and Patient connected to face mask oxygen  Post-op Assessment: Report given to PACU RN and Post -op Vital signs reviewed and stable  Post vital signs: Reviewed and stable  Complications: No apparent anesthesia complications

## 2013-08-23 NOTE — Anesthesia Postprocedure Evaluation (Signed)
  Anesthesia Post-op Note  Patient: Robert Rhodes  Procedure(s) Performed: Procedure(s) (LRB): Laparoscopic total abdominal colectomy and hernia repair (N/A)  Patient Location: PACU  Anesthesia Type: General  Level of Consciousness: awake and alert   Airway and Oxygen Therapy: Patient Spontanous Breathing  Post-op Pain: mild  Post-op Assessment: Post-op Vital signs reviewed, Patient's Cardiovascular Status Stable, Respiratory Function Stable, Patent Airway and No signs of Nausea or vomiting  Last Vitals:  Filed Vitals:   08/23/13 0521  BP: 155/93  Pulse: 87  Temp: 36.3 C  Resp: 20    Post-op Vital Signs: stable   Complications: No apparent anesthesia complications

## 2013-08-23 NOTE — Progress Notes (Signed)
Pt up with assist in hallway.  Able to walk about 30 feet this first time.  PCA controlling pain easily.  Assisted back to bed.  Call bell in reach.

## 2013-08-23 NOTE — Anesthesia Preprocedure Evaluation (Addendum)
Anesthesia Evaluation  Patient identified by MRN, date of birth, ID band Patient awake    Reviewed: Allergy & Precautions, H&P , NPO status , Patient's Chart, lab work & pertinent test results  Airway Mallampati: II TM Distance: >3 FB Neck ROM: full    Dental  (+) Edentulous Upper, Edentulous Lower   Pulmonary neg pulmonary ROS, former smoker,  breath sounds clear to auscultation  Pulmonary exam normal       Cardiovascular Exercise Tolerance: Good negative cardio ROS  Rhythm:regular Rate:Normal     Neuro/Psych negative neurological ROS  negative psych ROS   GI/Hepatic Neg liver ROS, PUD, GERD-  Medicated and Controlled,UC. Acute esophagitis   Endo/Other  negative endocrine ROS  Renal/GU negative Renal ROS  negative genitourinary   Musculoskeletal   Abdominal   Peds  Hematology negative hematology ROS (+)   Anesthesia Other Findings   Reproductive/Obstetrics negative OB ROS                         Anesthesia Physical Anesthesia Plan  ASA: II  Anesthesia Plan: General   Post-op Pain Management:    Induction: Intravenous  Airway Management Planned: Oral ETT  Additional Equipment:   Intra-op Plan:   Post-operative Plan: Extubation in OR  Informed Consent: I have reviewed the patients History and Physical, chart, labs and discussed the procedure including the risks, benefits and alternatives for the proposed anesthesia with the patient or authorized representative who has indicated his/her understanding and acceptance.   Dental Advisory Given  Plan Discussed with: CRNA and Surgeon  Anesthesia Plan Comments:        Anesthesia Quick Evaluation

## 2013-08-23 NOTE — Preoperative (Signed)
Beta Blockers   Reason not to administer Beta Blockers:Not Applicable 

## 2013-08-23 NOTE — Op Note (Signed)
08/23/2013  1:44 PM  PATIENT:  Robert Rhodes  68 y.o. male  Patient Care Team: Angelica Chessman, MD as PCP - General (Internal Medicine) Ladell Pier, MD as Consulting Physician (Oncology) Carola Frost, RN as Registered Nurse  PRE-OPERATIVE DIAGNOSIS:  ulcerative colitis, colon cancer, ventral hernia  POST-OPERATIVE DIAGNOSIS:  ulcerative colitis, colon cancer, ventral hernia  PROCEDURE:  Laparoscopic total abdominal colectomy and primary hernia repair  SURGEON:  Surgeon(s): Leighton Ruff, MD Shann Medal, MD  ASSISTANT: Lucia Gaskins   ANESTHESIA:   general  EBL:  Total I/O In: 37 [I.V.:3600] Out: 400 [Urine:200; Blood:200]  DRAINS: (49F) Jackson-Pratt drain(s) with closed bulb suction in the SQ   SPECIMEN:  Source of Specimen:  total abd colon, ventral mesh  DISPOSITION OF SPECIMEN:  PATHOLOGY  COUNTS:  YES  PLAN OF CARE: Admit to inpatient   PATIENT DISPOSITION:  PACU - hemodynamically stable.  INDICATION: This is a 68 year old male with Ulcerative Colitis who presents to the office with a right-sided colon cancer. Biopsies reveal adenocarcinoma. CT scan showed no evidence of metastatic disease except for some periaortic lymph nodes. He underwent EGD for evaluation of any upper GI tumors. This was all negative. He did have some esophagitis. Given his ulcerative colitis I have recommended that he undergo a total abdominal colectomy. He only has signs of microscopic inflammation in his rectum and therefore I did not see the need to perform a total colectomy with proctectomy. I explained the risk of this decision to the patient. He understands that he will need to undergo yearly endoscopic surveillance of his rectum.   OR FINDINGS: Colon mass at ileocecal valve. Ventral hernia with incarcerated omentum  DESCRIPTION: the patient was identified in the preoperative holding area and taken to the OR where they were laid Supine on the operating room table.  Gen. anesthesia  was induced without difficulty. The patient was then placed in lithotomy position and appropriately padded. A Foley catheter was inserted under sterile conditions.  SCDs were also noted to be in place prior to the initiation of anesthesia.  The patient was then prepped and draped in the usual sterile fashion.    A surgical timeout was performed indicating the correct patient, procedure, positioning and need for preoperative antibiotics.  I was able to gain entry into the abdomen using an optical entry trocar and a 5 mm port. This was done through the left upper quadrant under direct visualization. There was no sign of injury upon entry. 2 more 5 mm ports were placed in the patient's right upper and right lower quadrants under direct visualization. I then used these ports to mobilize the omentum out of the hernia defect using an insole device for hemostasis. Once all the abdominal contents were removed from the hernia I was able to place 2 more ports in the left upper and left lower quadrants. These were both 5 mm ports placed under direct visualization. Lastly a suprapubic port was placed also under visualization. After this was completed we began in the right side of the patient's abdomen. The cecum and appendix were mobilized using the Enseal device and blunt dissection. The white line of Toldt was resected. The right colon was mobilized. The hepatic flexure was taken down using the Enseal device. The duodenum was identified and preserved. I then turned my attention to the ileocolic vessel.  This was skeletonized at the base using blunt dissection and the enseal device. I then mobilized the transverse colon away from the gastrocolic ligament.  At this point I terminated into the patient's left side. I began with a medial to lateral approach. I identified the inferior mesenteric artery and dissected bluntly underneath this. I identified the femoral vessels and left ureter. I then dissected above this plane out  laterally. Once this was completed the IMA was transected using the Enseal device after being skeletonized. The IMV was transected in similar fashion. I identified the rectum. The mesorectum was divided using the Enseal device. I then mobilized the left colon away from the sidewall using blunt mobilization and the Enseal device.  The splenic flexure was also taken down using the Enseal device. I was then able to mobilize the entire colon. I transected the right and left mesentery using the Enseal device laparoscopically. The transverse colon mesentery was left attached. I then made a 7 cm vertical incision over the patient's ventral hernia. The hernia sac was entered. A wound protector was placed through the hernia site. The entire colon was brought out of the abdomen.  The terminal ileum was transected using the laparoscopic purple load stapler. The transverse mesentery was dissected using Kelly clamps and suture ligated. We brought out as much colon as we cut through this site and then transected it using a laparoscopic purple load stapler. The remaining portion of the sigmoid was dropped back into the abdomen. This portion of colon was sent to pathology for further examination. The abdomen was irrigated with normal saline.  A 25 mm EEA staple amble was placed into the distal terminal ileum and brought out with the spike proximally. The distal end of the terminal ileum was transected using a purple load of the endoscopic stapler. A pursestring suture was placed around the spike anvil using a 2-0 Prolene suture.  This was dropped back into the abdomen.  A GelPort was placed over the wound protector and the abdomen was reinsufflated. Hemostasis appeared good. The remaining portion of the sigmoid colon was transected using 2 purple loads of the endoscopic stapler. This was then brought out through the abdominal wall defect.  The 25 mm EEA stapler was placed into the rectum and brought out through the distal rectal  stump.  The spike was deployed.  The ileorectal anastomosis was created after making sure that the mesentery was not twisted. This was done at approximately 15 cm from the anal verge. There was one small area that appeared concerning for possible rectal stump breakdown 222 staple lines that were crossing in a V-shaped manner. 2 laparoscopically placed 2-0 silk sutures were placed to oversew this area. The anastomosis was tested under water and there was no leak. The abdomen was evaluated one more time. There was no signs of active bleeding. All laparoscopic equipment was then removed. The abdomen was desufflated. We changed to clean drapes and instruments. I then removed the hernia sac using Bovie electrocautery. The previous hernia mesh was noted to be in the hernia sac and was resected and removed.  The fascia was then mobilized 360 around the defect. The fascia was then closed in a vertical fashion using interrupted #1 Novafil sutures. A 19 [place] Blake drain was placed into the hernia defect and brought out through one of the left-sided port sites. This was secured into place with a 2-0 nylon suture.  The skin was then closed using 4-0 Vicryl sutures. Dermabond was used over this. The patient was then awakened from anesthesia and sent to the post anesthesia care unit in stable condition. All counts were correct per operating  room staff.

## 2013-08-23 NOTE — H&P (Addendum)
Chief Complaint   Patient presents with   .  Colon Cancer   HISTORY:  Robert Rhodes is a 68 y.o. male who presents to the office with a cecal mass found on colonoscopy. 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 The remaining colon mucosa was normal without evidence of active inflammation. Biopsies were negative as well. He does have some minimal microscopic chronic inflammation in his rectum but no evidence of disease on colonoscopy. He states that he has a flare approximately every 2-3 years and uses sulfasalazine for control. He denies any weight loss. He denies any changes in his bowel habits. He denies any nausea or vomiting. He denies any chest pain or shortness of breath. He states that he can walk up a flight of stairs without getting significantly winded or having chest pain.  Past Medical History   Diagnosis  Date   .  Vitamin D deficiency    .  UC (ulcerative colitis)    .  Arthritis    .  Osteoporosis     Past Surgical History   Procedure  Laterality  Date   .  Ventral hernia repair      Current Outpatient Prescriptions   Medication  Sig  Dispense  Refill   .  AMBULATORY NON FORMULARY MEDICATION  Slippery elm  1 tablet by mouth once daily     .  AMBULATORY NON FORMULARY MEDICATION  Peppermint Oil  One capsule by mouth three times a day as needed     .  AMBULATORY NON FORMULARY MEDICATION  Raspberry fiber blend  Three tsp daily     .  AMBULATORY NON FORMULARY MEDICATION  Artichoke  Take 1 capsule by mouth daily     .  famotidine (PEPCID) 20 MG tablet  Take 1 tablet (20 mg total) by mouth 2 (two) times daily.  60 tablet  3   .  naproxen (NAPROSYN) 500 MG tablet  Take 1 tablet (500 mg total) by mouth 2 (two) times daily with a meal.  60 tablet  1   .  sulfaSALAzine (AZULFIDINE) 500 MG tablet  Take 1 tablet (500 mg total) by mouth 4 (four) times daily.  120 tablet  3   .  traMADol (ULTRAM) 50 MG tablet  Take 1 tablet (50 mg total) by mouth every 8 (eight) hours as needed.  60  tablet  1   .  vitamin E 100 UNIT capsule  Take 100 Units by mouth daily.      No current facility-administered medications for this visit.    Allergies   Allergen  Reactions   .  Advil [Ibuprofen]    .  Aspirin    .  Tylenol [Acetaminophen]     Family History   Problem  Relation  Age of Onset   .  Cancer  Mother      type unknown   .  Diabetes  Father    .  Heart disease  Father    .  Heart disease  Sister    .  Heart attack  Brother     History    Social History   .  Marital Status:  Single     Spouse Name:  N/A     Number of Children:  0   .  Years of Education:  N/A    Occupational History   .  retired     Social History Main Topics   .  Smoking status:  Former Smoker  Types:  Cigarettes     Quit date:  06/14/2003   .  Smokeless tobacco:  Never Used      Comment: quit in July 2005   .  Alcohol Use:  No      Comment: quit in 2005   .  Drug Use:  No   .  Sexual Activity:  None    Other Topics  Concern   .  None    Social History Narrative   .  None   REVIEW OF SYSTEMS - PERTINENT POSITIVES ONLY:  Review of Systems - General ROS: negative for - chills or fever  Hematological and Lymphatic ROS: negative for - bleeding problems, blood clots or weight loss  Respiratory ROS: no cough, shortness of breath, or wheezing  Cardiovascular ROS: no chest pain or dyspnea on exertion  Gastrointestinal ROS: no abdominal pain, change in bowel habits, or black or bloody stools  Genito-Urinary ROS: no dysuria, trouble voiding, or hematuria  EXAM:  Filed Vitals:   08/23/13 0521  BP: 155/93  Pulse: 87  Temp: 97.4 F (36.3 C)  Resp: 20   Gen: No acute distress. Well nourished and well groomed.  Neurological: Alert and oriented to person, place, and time. Coordination normal.  Head: Normocephalic and atraumatic.  Eyes: Conjunctivae are normal. Pupils are equal, round, and reactive to light. No scleral icterus.  Neck: Normal range of motion. Neck supple. No tracheal  deviation or thyromegaly present. No cervical lymphadenopathy.  Cardiovascular: Normal rate, regular rhythm, normal heart sounds and intact distal pulses.   Respiratory: Effort normal. No respiratory distress. No chest wall tenderness. Breath sounds normal. No wheezes, rales or rhonchi.  GI: Soft. Bowel sounds are normal. The abdomen is soft and nontender. There is no rebound and no guarding. There is a large upper abdominal hernia. There are no major surgical scars. Musculoskeletal: Normal range of motion. Extremities are nontender.  Skin: Skin is warm and dry. No rash noted. No diaphoresis. No erythema. No pallor. No clubbing, cyanosis, or edema.  Psychiatric: Normal mood and affect. Behavior is normal. Judgment and thought content normal.   RADIOLOGY RESULTS:  Images and reports are reviewed.  CT chest abdomen and pelvis IMPRESSION:  1. Cecal mass without local/ regional metastatic disease.  2. Heterogeneous lesion in the periphery of the right hepatic lobe.  While a hemangioma can have this appearance, metastatic disease  cannot be definitively excluded. Additional evaluation with MR  abdomen without and with contrast is recommended, as clinically  indicated.  3. Pathologically enlarged periesophageal lymph nodes, possibly  metastatic.  4. Probable muscular asymmetry in the lateral left supraclavicular  fossa, incompletely imaged. Please correlate with physical exam as a  mass cannot be definitively excluded.  5. Previous ventral hernia repair with recurrent large fat  containing midline ventral hernia.  6. Mild prostate enlargement.   Lab Results  Component Value Date   CEA 1.2 07/16/2013    ASSESSMENT AND PLAN:  Robert Rhodes is a 68 y.o. M With a long-standing history of Ulcerative colitis. His most recent colonoscopy shows minimal inflammation but a new cecal cancer. His CT scans are concerning for possible metastatic disease and also showed evidence of a recurrent ventral  hernia. The patient states that he has had his MRI but is on a CD disc. He did not have that with him today. His CT scans are also concerning for some para-esophageal lymph nodes that are pathologically enlarged.  He has underwent an EGD, which showed esophagitis  and no signs of carcinoma. We discussed possible surgical resection. He has no signs of metastatic disease.  I think it would be reasonable to proceed with a laparoscopic total abdominal colectomy and ileorectal anastomosis, given that he has minimal rectal inflammation. I do not feel that a total proctocolectomy is necessary in this patient, given his age and lack of inflammation. We have discussed his case in multidisciplinary GI tumor conference.  He understands that he will need yearly endoscopic surveillance of his rectum and has committed to doing this.  I will attempt to fix his hernia today as well.  He understands that I will most likely need to close it primarily and the chance of recurrence is around 40%.     Rosario Adie, MD  Colon and Rectal Surgery / [facility]

## 2013-08-24 LAB — BASIC METABOLIC PANEL
BUN: 8 mg/dL (ref 6–23)
CO2: 29 mEq/L (ref 19–32)
Calcium: 9 mg/dL (ref 8.4–10.5)
Chloride: 102 mEq/L (ref 96–112)
Creatinine, Ser: 1 mg/dL (ref 0.50–1.35)
GFR calc Af Amer: 87 mL/min — ABNORMAL LOW (ref >90)
GFR, EST NON AFRICAN AMERICAN: 75 mL/min — AB (ref >90)
GLUCOSE: 105 mg/dL — AB (ref 70–99)
Potassium: 4.2 mEq/L (ref 3.7–5.3)
Sodium: 140 mEq/L (ref 137–147)

## 2013-08-24 LAB — CBC
HCT: 37.2 % — ABNORMAL LOW (ref 39.0–52.0)
Hemoglobin: 12.2 g/dL — ABNORMAL LOW (ref 13.0–17.0)
MCH: 28.5 pg (ref 26.0–34.0)
MCHC: 32.8 g/dL (ref 30.0–36.0)
MCV: 86.9 fL (ref 78.0–100.0)
Platelets: 276 10*3/uL (ref 150–400)
RBC: 4.28 MIL/uL (ref 4.22–5.81)
RDW: 13.5 % (ref 11.5–15.5)
WBC: 18.2 10*3/uL — ABNORMAL HIGH (ref 4.0–10.5)

## 2013-08-24 MED ORDER — KCL IN DEXTROSE-NACL 20-5-0.45 MEQ/L-%-% IV SOLN
INTRAVENOUS | Status: DC
  Administered 2013-08-24: 75 mL/h via INTRAVENOUS
  Administered 2013-08-25 – 2013-08-27 (×4): via INTRAVENOUS
  Administered 2013-08-27: 1 mL via INTRAVENOUS
  Administered 2013-08-28 – 2013-09-01 (×7): via INTRAVENOUS
  Administered 2013-09-01: 1 mL via INTRAVENOUS
  Filled 2013-08-24 (×20): qty 1000

## 2013-08-24 NOTE — Progress Notes (Signed)
1 Day Post-Op Lap TAC with IRA for UC and colon cancer Subjective: Feels bloated, denies nausea, pain controlled with PCA, no flatus   Objective: Vital signs in last 24 hours: Temp:  [97.5 F (36.4 C)-98.5 F (36.9 C)] 97.7 F (36.5 C) (03/14 0533) Pulse Rate:  [85-121] 85 (03/14 0533) Resp:  [12-19] 16 (03/14 0744) BP: (132-169)/(61-92) 132/80 mmHg (03/14 0533) SpO2:  [98 %-100 %] 100 % (03/14 0744) FiO2 (%):  [41 %] 41 % (03/13 2044)   Intake/Output from previous day: 03/13 0701 - 03/14 0700 In: 5235 [P.O.:100; I.V.:5135] Out: 2645 [Urine:2350; Drains:95; Blood:200] Intake/Output this shift:     General appearance: alert and cooperative GI: normal findings: soft, non-tender and abnormal findings:  distended  Incision: no significant drainage, no significant erythema  Lab Results:   Recent Labs  08/24/13 0533  WBC 18.2*  HGB 12.2*  HCT 37.2*  PLT 276   BMET  Recent Labs  08/24/13 0533  NA 140  K 4.2  CL 102  CO2 29  GLUCOSE 105*  BUN 8  CREATININE 1.00  CALCIUM 9.0   PT/INR No results found for this basename: LABPROT, INR,  in the last 72 hours ABG No results found for this basename: PHART, PCO2, PO2, HCO3,  in the last 72 hours  MEDS, Scheduled . enoxaparin (LOVENOX) injection  40 mg Subcutaneous Q24H  . famotidine  20 mg Oral BID  . morphine   Intravenous 6 times per day  . sulfaSALAzine  500 mg Oral QID    Studies/Results: No results found.  Assessment: s/p Procedure(s): Laparoscopic total abdominal colectomy and hernia repair Patient Active Problem List   Diagnosis Date Noted  . Nonspecific (abnormal) findings on radiological and other examination of gastrointestinal tract 07/18/2013  . Acute esophagitis 07/18/2013  . Colon cancer 07/16/2013  . Hypovitaminosis D 05/06/2013  . Ulcerative colitis, unspecified 04/04/2013  . Ventral hernia 03/05/2013  . Knee pain, bilateral 03/05/2013    Expected post op course  Plan: Cont sips of  clears MIV Ambulate, incentive spirometry    LOS: 1 day     .Rosario Adie, [facility] [place]   08/24/2013 7:56 AM

## 2013-08-24 NOTE — Evaluation (Signed)
Physical Therapy Evaluation Patient Details Name: Robert Rhodes MRN: [id number] DOB: [birth year] Today's Date: 08/24/2013 Time: 1440-1510 PT Time Calculation (min): 30 min  PT Assessment / Plan / Recommendation History of Present Illness   Pt s/p laparoscopic total abdominal colectomy and hernia repair  Clinical Impression  Pt s/p above procedure 08/23/13 presents with functional mobility limitations related to abdominal discomfort and SOB on exertion.  Pt is currently mobilizing at min assist level with RW and O2@ 2L for ambulation.  Pt very motivated and should progress to d/c home with niece at [place] level.    PT Assessment  Patient needs continued PT services    Follow Up Recommendations  No PT follow up;Supervision/Assistance - 24 hour    Does the patient have the potential to tolerate intense rehabilitation      Barriers to Discharge        Equipment Recommendations  None recommended by PT (Pt states hopes to not need RW by d/c - will reassess needs)    Recommendations for Other Services OT consult   Frequency Min 3X/week    Precautions / Restrictions Precautions Precautions: Fall Restrictions Weight Bearing Restrictions: No   Pertinent Vitals/Pain 8/10 abd pain 2* gas - PCA used - RN aware      Mobility  Bed Mobility Overal bed mobility: Needs Assistance Bed Mobility: Supine to Sit Supine to sit: Min assist General bed mobility comments: pt educated to log roll and utilized bedrail to self assist to EOB Transfers Overall transfer level: Needs assistance Equipment used: Rolling walker (2 wheeled) Transfers: Sit to/from Stand Sit to Stand: Min assist General transfer comment: cues for saftey awareness with equipment and use of UEs to self assist Ambulation/Gait Ambulation/Gait assistance: Min assist;+2 safety/equipment Ambulation Distance (Feet): 900 Feet Assistive device: Rolling walker (2 wheeled) Gait Pattern/deviations: Step-through  pattern;Shuffle General Gait Details: cues for position from RW and posture.  Several standing rests 2* elevating RR    Exercises     PT Diagnosis: Difficulty walking  PT Problem List: Decreased activity tolerance;Decreased mobility;Decreased knowledge of use of DME;Pain;Decreased safety awareness PT Treatment Interventions: DME instruction;Gait training;Stair training;Functional mobility training;Therapeutic activities;Patient/family education     PT Goals(Current goals can be found in the care plan section) Acute Rehab PT Goals Patient Stated Goal: Resume previous lifestyle  PT Goal Formulation: With patient Time For Goal Achievement: 09/07/13 Potential to Achieve Goals: Good  Visit Information  Last PT Received On: 08/24/13 Assistance Needed: +2 (equipment)       Prior [place] expects to be discharged to:: Private residence Living Arrangements: Other relatives Available Help at Discharge: Family Type of Home: House Home Access: Stairs to enter Technical brewer of Steps: 3 Entrance Stairs-Rails: None Home Layout: Two level Alternate Level Stairs-Number of Steps: 14 Alternate Level Stairs-Rails: Right Home Equipment: Cane - single point Prior Function Level of Independence: Independent;Independent with assistive device(s) Comments: Used cane when knee was painful. Communication Communication: No difficulties    Cognition  Cognition Arousal/Alertness: Awake/alert Behavior During Therapy: WFL for tasks assessed/performed Overall Cognitive Status: Within Functional Limits for tasks assessed    Extremity/Trunk Assessment Upper Extremity Assessment Upper Extremity Assessment: Overall WFL for tasks assessed Lower Extremity Assessment Lower Extremity Assessment: Overall WFL for tasks assessed Cervical / Trunk Assessment Cervical / Trunk Assessment: Other exceptions Cervical / Trunk Exceptions: distended abdomen - pt states pain is  gas related   Balance    End of Session PT - End of Session Equipment Utilized  During Treatment: Oxygen Activity Tolerance: Patient tolerated treatment well Patient left: in chair;with call bell/phone within reach Nurse Communication: Mobility status  GP     Berdena Cisek 08/24/2013, 3:27 PM

## 2013-08-25 LAB — CBC
HEMATOCRIT: 35.2 % — AB (ref 39.0–52.0)
HEMOGLOBIN: 11.5 g/dL — AB (ref 13.0–17.0)
MCH: 28.5 pg (ref 26.0–34.0)
MCHC: 32.7 g/dL (ref 30.0–36.0)
MCV: 87.3 fL (ref 78.0–100.0)
Platelets: 275 10*3/uL (ref 150–400)
RBC: 4.03 MIL/uL — AB (ref 4.22–5.81)
RDW: 13.5 % (ref 11.5–15.5)
WBC: 13.8 10*3/uL — ABNORMAL HIGH (ref 4.0–10.5)

## 2013-08-25 LAB — BASIC METABOLIC PANEL
BUN: 9 mg/dL (ref 6–23)
CO2: 30 meq/L (ref 19–32)
Calcium: 8.9 mg/dL (ref 8.4–10.5)
Chloride: 99 mEq/L (ref 96–112)
Creatinine, Ser: 0.98 mg/dL (ref 0.50–1.35)
GFR calc Af Amer: >90 mL/min (ref >90)
GFR calc non Af Amer: 82 mL/min — ABNORMAL LOW (ref >90)
GLUCOSE: 104 mg/dL — AB (ref 70–99)
POTASSIUM: 3.7 meq/L (ref 3.7–5.3)
Sodium: 137 mEq/L (ref 137–147)

## 2013-08-25 NOTE — Progress Notes (Signed)
2 Days Post-Op Lap TAC with IRA for UC and colon cancer Subjective: Feels bloated, denies nausea, pain controlled with PCA, no flatus, ambulating in hall  Objective: Vital signs in last 24 hours: Temp:  [97.9 F (36.6 C)-98 F (36.7 C)] 97.9 F (36.6 C) (03/15 0539) Pulse Rate:  [93-96] 93 (03/15 0539) Resp:  [14-22] 15 (03/15 0717) BP: (138-142)/(79) 142/79 mmHg (03/15 0539) SpO2:  [95 %-100 %] 97 % (03/15 0717)   Intake/Output from previous day: 03/14 0701 - 03/15 0700 In: 1762.5 [I.V.:1762.5] Out: 2535 [Urine:2500; Drains:35] Intake/Output this shift:   General appearance: alert and cooperative GI: normal findings: soft, non-tender and abnormal findings:  distended  Incision: no significant drainage, no significant erythema  Lab Results:   Recent Labs  08/24/13 0533 08/25/13 0524  WBC 18.2* 13.8*  HGB 12.2* 11.5*  HCT 37.2* 35.2*  PLT 276 275   BMET  Recent Labs  08/24/13 0533 08/25/13 0524  NA 140 137  K 4.2 3.7  CL 102 99  CO2 29 30  GLUCOSE 105* 104*  BUN 8 9  CREATININE 1.00 0.98  CALCIUM 9.0 8.9   PT/INR No results found for this basename: LABPROT, INR,  in the last 72 hours ABG No results found for this basename: PHART, PCO2, PO2, HCO3,  in the last 72 hours  MEDS, Scheduled . enoxaparin (LOVENOX) injection  40 mg Subcutaneous Q24H  . famotidine  20 mg Oral BID  . morphine   Intravenous 6 times per day  . sulfaSALAzine  500 mg Oral QID    Studies/Results: No results found.  Assessment: s/p Procedure(s): Laparoscopic total abdominal colectomy and hernia repair Patient Active Problem List   Diagnosis Date Noted  . Nonspecific (abnormal) findings on radiological and other examination of gastrointestinal tract 07/18/2013  . Acute esophagitis 07/18/2013  . Colon cancer 07/16/2013  . Hypovitaminosis D 05/06/2013  . Ulcerative colitis, unspecified 04/04/2013  . Ventral hernia 03/05/2013  . Knee pain, bilateral 03/05/2013    Expected  post op course  Plan: Cont sips of clears MIV Ambulate, incentive spirometry    LOS: 2 days     .Rosario Adie, [facility], [place]   08/25/2013 9:00 AM

## 2013-08-26 ENCOUNTER — Encounter (HOSPITAL_COMMUNITY): Payer: Self-pay | Admitting: General Surgery

## 2013-08-26 LAB — BASIC METABOLIC PANEL
BUN: 8 mg/dL (ref 6–23)
CALCIUM: 8.9 mg/dL (ref 8.4–10.5)
CO2: 29 mEq/L (ref 19–32)
Chloride: 96 mEq/L (ref 96–112)
Creatinine, Ser: 0.99 mg/dL (ref 0.50–1.35)
GFR calc Af Amer: >90 mL/min (ref >90)
GFR calc non Af Amer: 82 mL/min — ABNORMAL LOW (ref >90)
GLUCOSE: 106 mg/dL — AB (ref 70–99)
Potassium: 3.5 mEq/L — ABNORMAL LOW (ref 3.7–5.3)
Sodium: 135 mEq/L — ABNORMAL LOW (ref 137–147)

## 2013-08-26 LAB — CBC
HCT: 35.7 % — ABNORMAL LOW (ref 39.0–52.0)
Hemoglobin: 11.6 g/dL — ABNORMAL LOW (ref 13.0–17.0)
MCH: 28.7 pg (ref 26.0–34.0)
MCHC: 32.5 g/dL (ref 30.0–36.0)
MCV: 88.4 fL (ref 78.0–100.0)
PLATELETS: 287 10*3/uL (ref 150–400)
RBC: 4.04 MIL/uL — ABNORMAL LOW (ref 4.22–5.81)
RDW: 13.2 % (ref 11.5–15.5)
WBC: 13 10*3/uL — ABNORMAL HIGH (ref 4.0–10.5)

## 2013-08-26 MED ORDER — METOPROLOL TARTRATE 1 MG/ML IV SOLN
5.0000 mg | Freq: Four times a day (QID) | INTRAVENOUS | Status: DC
  Administered 2013-08-26 – 2013-09-03 (×13): 5 mg via INTRAVENOUS
  Filled 2013-08-26 (×37): qty 5

## 2013-08-26 NOTE — Progress Notes (Signed)
3 Days Post-Op Lap TAC with IRA for UC and colon cancer Subjective: Feels less bloated, denies nausea, pain controlled with PCA but pt cutting back some to try to induce better bowel function, no flatus, ambulating in hall  Objective: Vital signs in last 24 hours: Temp:  [97.3 F (36.3 C)-97.9 F (36.6 C)] 97.9 F (36.6 C) (03/16 0600) Pulse Rate:  [102-109] 109 (03/16 0600) Resp:  [11-25] 12 (03/16 0827) BP: (127-132)/(77-81) 132/81 mmHg (03/16 0600) SpO2:  [98 %-99 %] 99 % (03/16 0827) FiO2 (%):  [40 %] 40 % (03/15 2000)   Intake/Output from previous day: 03/15 0701 - 03/16 0700 In: 1837.5 [I.V.:1837.5] Out: 1315 [Urine:1300; Drains:15] Intake/Output this shift: Total I/O In: -  Out: 10 [Drains:10] General appearance: alert and cooperative GI: normal findings: soft, non-tender and abnormal findings: less distended  Incision: no significant drainage, no significant erythema JP: min ss fluid  Lab Results:   Recent Labs  08/25/13 0524 08/26/13 0445  WBC 13.8* 13.0*  HGB 11.5* 11.6*  HCT 35.2* 35.7*  PLT 275 287   BMET  Recent Labs  08/25/13 0524 08/26/13 0445  NA 137 135*  K 3.7 3.5*  CL 99 96  CO2 30 29  GLUCOSE 104* 106*  BUN 9 8  CREATININE 0.98 0.99  CALCIUM 8.9 8.9   PT/INR No results found for this basename: LABPROT, INR,  in the last 72 hours ABG No results found for this basename: PHART, PCO2, PO2, HCO3,  in the last 72 hours  MEDS, Scheduled . enoxaparin (LOVENOX) injection  40 mg Subcutaneous Q24H  . famotidine  20 mg Oral BID  . metoprolol  5 mg Intravenous 4 times per day  . morphine   Intravenous 6 times per day  . sulfaSALAzine  500 mg Oral QID    Studies/Results: No results found.  Assessment: s/p Procedure(s): Laparoscopic total abdominal colectomy and hernia repair Patient Active Problem List   Diagnosis Date Noted  . Nonspecific (abnormal) findings on radiological and other examination of gastrointestinal tract 07/18/2013   . Acute esophagitis 07/18/2013  . Colon cancer 07/16/2013  . Hypovitaminosis D 05/06/2013  . Ulcerative colitis, unspecified 04/04/2013  . Ventral hernia 03/05/2013  . Knee pain, bilateral 03/05/2013    Pt slightly tachy.  Pt doesn't seem hypovolemic or hypoxic: Good UOP, Hgb stable, Good oxygenation.  wbc trending down, no fevers and abd pain better,so doubt abd sepsis. It could be due to pain.  Will add low dose B blocker and monitor.  Plan: Cont sips of clears- will await better bowel function before advancing diet MIV Ambulate, incentive spirometry    LOS: 3 days     .Rosario Adie, [facility], [place]   08/26/2013 9:59 AM

## 2013-08-27 ENCOUNTER — Inpatient Hospital Stay (HOSPITAL_COMMUNITY): Payer: Medicare HMO

## 2013-08-27 MED ORDER — LORAZEPAM 2 MG/ML IJ SOLN
0.5000 mg | Freq: Once | INTRAMUSCULAR | Status: AC
  Administered 2013-08-27: 0.5 mg via INTRAVENOUS
  Filled 2013-08-27: qty 1

## 2013-08-27 NOTE — Progress Notes (Signed)
Dr. Marcello Moores in making rounds, informed about abdominal distention, belching, hiccups, increased pain. Dr. Marcello Moores in to see patient.

## 2013-08-27 NOTE — Progress Notes (Signed)
Pt ambulated around entire unit x 3. tolerated walk with no problems. Robert Rhodes

## 2013-08-27 NOTE — Progress Notes (Signed)
No show for appt. 

## 2013-08-27 NOTE — Progress Notes (Signed)
New order for a NG tube to be placed due to abdominal distention and N/V.  Nurse attempted first time and pt pulled tube out stating that he is going to have to be put to sleep for placement.  Nurse called Dr. Marcello Moores and got a one time order for 0.7m of ativan iv.  Nurse administered medication.  Nurse then attempted another NG tube and was successful.  Pt projectile vomited multiple times during placement. Placement was checked and NG tube hooked to canister.  Pt had 300 cc's of brown drainage in canister.  Pt then asked the nurse how long he will have the NG tube.  Nurse informed pt that he will have the tube until his abdominal distention was better. Pt then stated "I can't have this tube in my nose for a whole day."  Nurse explained to the pt that the NG tube would make him feel better.  Pt then pulled out his NG tube stating that "I can't stand having that tube up my nose, I will go off up in here if I had to have that tube all day."

## 2013-08-27 NOTE — Progress Notes (Signed)
Patient in restroom, called to assist to bed. Patient standing with abdomen more distended and tight, patient still having hiccups, belching. Patient has ambulated multiple times throughout night with some relief. Patient refuses medications and abdominal binder. Patient given 20cc of beef broth prior to using the restroom @ 0630, patient complains this increased gas. Encouraged patient to only take few ice chips at this time. Paged Dr. Johney Maine, awaiting response, report given to Evalee Jefferson, RN.

## 2013-08-27 NOTE — Progress Notes (Signed)
4 Days Post-Op Lap TAC with IRA for UC and colon cancer Subjective: Feels more bloated, denies nausea, no flatus, having some liquid BM's, having hiccups and belching a lot  Objective: Vital signs in last 24 hours: Temp:  [97.5 F (36.4 C)-98 F (36.7 C)] 98 F (36.7 C) (03/17 0536) Pulse Rate:  [99-102] 102 (03/17 0536) Resp:  [10-18] 18 (03/17 0352) BP: (128-133)/(79-86) 130/86 mmHg (03/17 0536) SpO2:  [98 %-100 %] 98 % (03/17 0536)   Intake/Output from previous day: 03/16 0701 - 03/17 0700 In: 1500 [I.V.:1500] Out: 695 [Urine:650; Drains:45] Intake/Output this shift:   General appearance: alert and cooperative GI: normal findings: soft, non-tender and abnormal findings: more distended  Incision: no significant drainage, no significant erythema JP: min ss fluid  Lab Results:   Recent Labs  08/25/13 0524 08/26/13 0445  WBC 13.8* 13.0*  HGB 11.5* 11.6*  HCT 35.2* 35.7*  PLT 275 287   BMET  Recent Labs  08/25/13 0524 08/26/13 0445  NA 137 135*  K 3.7 3.5*  CL 99 96  CO2 30 29  GLUCOSE 104* 106*  BUN 9 8  CREATININE 0.98 0.99  CALCIUM 8.9 8.9   PT/INR No results found for this basename: LABPROT, INR,  in the last 72 hours ABG No results found for this basename: PHART, PCO2, PO2, HCO3,  in the last 72 hours  MEDS, Scheduled . enoxaparin (LOVENOX) injection  40 mg Subcutaneous Q24H  . famotidine  20 mg Oral BID  . metoprolol  5 mg Intravenous 4 times per day  . morphine   Intravenous 6 times per day  . sulfaSALAzine  500 mg Oral QID    Studies/Results: No results found.  Assessment: s/p Procedure(s): Laparoscopic total abdominal colectomy and hernia repair Patient Active Problem List   Diagnosis Date Noted  . Nonspecific (abnormal) findings on radiological and other examination of gastrointestinal tract 07/18/2013  . Acute esophagitis 07/18/2013  . Colon cancer 07/16/2013  . Hypovitaminosis D 05/06/2013  . Ulcerative colitis, unspecified  04/04/2013  . Ventral hernia 03/05/2013  . Knee pain, bilateral 03/05/2013    Pt less tachy with metoprolol.  Plan: Cont sips of clears Will get AXR and place NG if stomach distended MIV Ambulate, incentive spirometry    LOS: 4 days     .Rosario Adie, [facility], [place]   08/27/2013 7:20 AM

## 2013-08-27 NOTE — Progress Notes (Signed)
Dr. Marcello Moores notified that pt had pulled out NG tube and refused to allow nurse to put another one down.  MD also notified that pt wanted something for hiccups.  MD stated that pt can only get the NG tube for his hiccups.  Pt is aware of MD's decision and continues to refuse NG tube.  Pt's niece tried to talk pt into getting the NG tube and pt still refused.

## 2013-08-27 NOTE — Progress Notes (Signed)
Physical Therapy Treatment Patient Details Name: Robert Rhodes MRN: [id number] DOB: [birth year] Today's Date: 08/27/2013 Time: 0051-1021 PT Time Calculation (min): 38 min  PT Assessment / Plan / Recommendation  History of Present Illness     PT Comments   Assisted pt OOB to amb several times around unit using RW.  Pt states "I love to walk".  Pt c/o continuous hiccups. Tolerated session well.    Follow Up Recommendations  No PT follow up;Supervision/Assistance - 24 hour     Does the patient have the potential to tolerate intense rehabilitation     Barriers to Discharge        Equipment Recommendations  None recommended by PT    Recommendations for Other Services    Frequency Min 3X/week   Progress towards PT Goals Progress towards PT goals: Progressing toward goals  Plan      Precautions / Restrictions Precautions Precautions: Fall Restrictions Weight Bearing Restrictions: No   Pertinent Vitals/Pain     Mobility  Bed Mobility Overal bed mobility: Needs Assistance Bed Mobility: Supine to Sit;Sit to Supine Supine to sit: Supervision;Min guard Sit to supine: Supervision;Min guard General bed mobility comments: pt educated to log roll and utilized bedrail to self assist to EOB Transfers Overall transfer level: Needs assistance Equipment used: Rolling walker (2 wheeled) Transfers: Sit to/from Stand Sit to Stand: Min assist;Min guard General transfer comment: cues for saftey awareness with equipment and use of UEs to self assist Ambulation/Gait Ambulation/Gait assistance: Min guard;Min assist Ambulation Distance (Feet): 950 Feet Assistive device: Rolling walker (2 wheeled) Gait Pattern/deviations: Step-through pattern Gait velocity: WFL General Gait Details: <25% VC's on safety with turns and upright posture.    PT Goals (current goals can now be found in the care plan section)    Visit Information  Last PT Received On: 08/27/13 Assistance Needed: +1     Subjective Data      Cognition       Balance     End of Session PT - End of Session Equipment Utilized During Treatment: Gait belt Activity Tolerance: Patient tolerated treatment well Patient left: in chair;with call bell/phone within reach   Rica Koyanagi  PTA [facility]  Acute  Rehab Pager      [phone]

## 2013-08-28 LAB — CBC
HEMATOCRIT: 36.4 % — AB (ref 39.0–52.0)
Hemoglobin: 12 g/dL — ABNORMAL LOW (ref 13.0–17.0)
MCH: 28.4 pg (ref 26.0–34.0)
MCHC: 33 g/dL (ref 30.0–36.0)
MCV: 86.3 fL (ref 78.0–100.0)
PLATELETS: 351 10*3/uL (ref 150–400)
RBC: 4.22 MIL/uL (ref 4.22–5.81)
RDW: 12.9 % (ref 11.5–15.5)
WBC: 10.7 10*3/uL — AB (ref 4.0–10.5)

## 2013-08-28 NOTE — Progress Notes (Signed)
Physical Therapy Treatment Patient Details Name: Robert Rhodes MRN: [id number] DOB: [birth year] Today's Date: 08/28/2013 Time: 6384-5364 PT Time Calculation (min): 28 min  PT Assessment / Plan / Recommendation  History of Present Illness     PT Comments   Pt feeling "worse" with Hiccups still present.  Assisted out of recliner and amb full unit several times.  Follow Up Recommendations  No PT follow up;Supervision/Assistance - 24 hour     Does the patient have the potential to tolerate intense rehabilitation     Barriers to Discharge        Equipment Recommendations       Recommendations for Other Services    Frequency Min 3X/week   Progress towards PT Goals Progress towards PT goals: Progressing toward goals  Plan      Precautions / Restrictions Precautions Precautions: Fall Restrictions Weight Bearing Restrictions: No   Pertinent Vitals/Pain     Mobility  Bed Mobility General bed mobility comments: Pt OOB in recliner Transfers Overall transfer level: Needs assistance Equipment used: Rolling walker (2 wheeled) Transfers: Sit to/from Stand Sit to Stand: Supervision;Min guard General transfer comment: cues for saftey awareness with equipment and use of UEs to self assist Ambulation/Gait Ambulation/Gait assistance: Supervision;Min guard Ambulation Distance (Feet): 950 Feet Assistive device: Rolling walker (2 wheeled) Gait Pattern/deviations: Step-through pattern Gait velocity: WFL    PT Goals (current goals can now be found in the care plan section)    Visit Information  Last PT Received On: 08/28/13 Assistance Needed: +1    Subjective Data      Cognition       Balance     End of Session PT - End of Session Equipment Utilized During Treatment: Gait belt Activity Tolerance: Patient tolerated treatment well Patient left: in chair;with call bell/phone within reach   Rica Koyanagi  PTA [facility]  Acute  Rehab Pager      [phone]

## 2013-08-28 NOTE — Progress Notes (Signed)
Patient requested to have NG Tube reinserted. NG Tube inserted and yielded 225cc of brownish fluid. Patient could not tolerate NG Tube to be left in. NG Tube removed. Will continue to monitor patient.

## 2013-08-28 NOTE — Progress Notes (Signed)
5 Days Post-Op Lap TAC with IRA for UC and colon cancer Subjective: Still feels bloated, denies nausea, no flatus, having some small liquid BM's, having hiccups and belching a lot. Vomited twice.  Attempted ng yesterday but pt couldn't tolerate this and pulled it out.  Refusing replacement.  Objective: Vital signs in last 24 hours: Temp:  [98.7 F (37.1 C)-99.4 F (37.4 C)] 98.7 F (37.1 C) (03/18 0535) Pulse Rate:  [85-103] 103 (03/18 1200) Resp:  [14-20] 20 (03/18 1200) BP: (114-166)/(74-91) 166/91 mmHg (03/18 1200) SpO2:  [96 %-100 %] 99 % (03/18 1200)   Intake/Output from previous day: 03/17 0701 - 03/18 0700 In: 1778.8 [P.O.:90; I.V.:1688.8] Out: 946 [Urine:525; Emesis/NG output:300; Drains:120; Stool:1] Intake/Output this shift: Total I/O In: 1 [Other:1] Out: 0  General appearance: alert and cooperative GI: normal findings: soft, non-tender and abnormal findings: more distended Hernia palpated, most likely dehisced fascia with vomiting Incision: no significant drainage, no significant erythema JP: ss fluid  Lab Results:   Recent Labs  08/26/13 0445 08/28/13 0440  WBC 13.0* 10.7*  HGB 11.6* 12.0*  HCT 35.7* 36.4*  PLT 287 351   BMET  Recent Labs  08/26/13 0445  NA 135*  K 3.5*  CL 96  CO2 29  GLUCOSE 106*  BUN 8  CREATININE 0.99  CALCIUM 8.9   PT/INR No results found for this basename: LABPROT, INR,  in the last 72 hours ABG No results found for this basename: PHART, PCO2, PO2, HCO3,  in the last 72 hours  MEDS, Scheduled . enoxaparin (LOVENOX) injection  40 mg Subcutaneous Q24H  . famotidine  20 mg Oral BID  . metoprolol  5 mg Intravenous 4 times per day  . morphine   Intravenous 6 times per day  . sulfaSALAzine  500 mg Oral QID    Studies/Results: Dg Abd Portable 1v  08/27/2013   CLINICAL DATA:  68 year old male with abdominal distention. Postop day 4. Following total abdominal colectomy and hernia repair.  EXAM: PORTABLE ABDOMEN - 1 VIEW   COMPARISON:  06/28/2013 CT  FINDINGS: Both sides of bowel wall are identified within the right upper abdomen -question pneumoperitoneum which may be postoperative.  A percutaneous drain overlying the left abdomen is noted.  No dilated bowel loops are identified.  Degenerative changes within the hips and spine are noted. No acute bony abnormalities are present.  IMPRESSION: Probable pneumoperitoneum within the right upper abdomen- not unexpected postoperatively but correlate clinically.  No dilated bowel loops identified.   Electronically Signed   By: Hassan Rowan M.D.   On: 08/27/2013 08:39    Assessment: s/p Procedure(s): Laparoscopic total abdominal colectomy and hernia repair Patient Active Problem List   Diagnosis Date Noted  . Nonspecific (abnormal) findings on radiological and other examination of gastrointestinal tract 07/18/2013  . Acute esophagitis 07/18/2013  . Colon cancer 07/16/2013  . Hypovitaminosis D 05/06/2013  . Ulcerative colitis, unspecified 04/04/2013  . Ventral hernia 03/05/2013  . Knee pain, bilateral 03/05/2013    Pt less tachy with metoprolol.   Pt appears to have post op ileus vs temp obstruction at anastomotic site due to swelling.  Pt refuses ng.  Plan: Cont npo Wbc normal, hgb stable, hr stable Recheck bmp tom MIV Ambulate, incentive spirometry    LOS: 5 days     .Rosario Adie, [facility], [place]   08/28/2013 1:34 PM

## 2013-08-29 LAB — BASIC METABOLIC PANEL
BUN: 9 mg/dL (ref 6–23)
CHLORIDE: 98 meq/L (ref 96–112)
CO2: 33 meq/L — AB (ref 19–32)
CREATININE: 0.9 mg/dL (ref 0.50–1.35)
Calcium: 8.8 mg/dL (ref 8.4–10.5)
GFR calc Af Amer: >90 mL/min (ref >90)
GFR calc non Af Amer: 85 mL/min — ABNORMAL LOW (ref >90)
Glucose, Bld: 106 mg/dL — ABNORMAL HIGH (ref 70–99)
Potassium: 3.5 mEq/L — ABNORMAL LOW (ref 3.7–5.3)
Sodium: 139 mEq/L (ref 137–147)

## 2013-08-29 LAB — PHOSPHORUS: PHOSPHORUS: 2.8 mg/dL (ref 2.3–4.6)

## 2013-08-29 LAB — MAGNESIUM: MAGNESIUM: 2.2 mg/dL (ref 1.5–2.5)

## 2013-08-29 NOTE — Progress Notes (Signed)
Dr. Marcello Moores aware via phone pt requested jello. Has has not c/o nausea or vomiting. Abdomen remains distended with faint BS. Ambulating frequently. See new orders received regarding diet.

## 2013-08-29 NOTE — Progress Notes (Signed)
6 Days Post-Op Lap TAC with IRA for UC and colon cancer Subjective: Feels less bloated, denies nausea, no flatus, having some small liquid BM's, denies any hiccups or belching so far today.  Attempted ng again yesterday but pt couldn't tolerate this and pulled it out (235m out).  Refusing replacement.  Continues to refuse abd binder.  Pt ambulating well.   Objective: Vital signs in last 24 hours: Temp:  [98.3 F (36.8 C)-99 F (37.2 C)] 98.3 F (36.8 C) (03/19 0610) Pulse Rate:  [70-103] 77 (03/19 0610) Resp:  [13-24] 13 (03/19 0753) BP: (106-166)/(68-91) 106/71 mmHg (03/19 0610) SpO2:  [95 %-100 %] 97 % (03/19 0753) FiO2 (%):  [39 %-45 %] 45 % (03/19 0400)   Intake/Output from previous day: 03/18 0701 - 03/19 0700 In: 1813.5 [I.V.:1812.5] Out: 97 [Emesis/NG output:2; Drains:95] Intake/Output this shift:   General appearance: alert and cooperative GI: normal findings: soft, non-tender and abnormal findings: more distended Hernia palpated, most likely dehisced fascia with vomiting Incision: no significant drainage, no significant erythema JP: ss fluid  Lab Results:   Recent Labs  08/28/13 0440  WBC 10.7*  HGB 12.0*  HCT 36.4*  PLT 351   BMET  Recent Labs  08/29/13 0510  NA 139  K 3.5*  CL 98  CO2 33*  GLUCOSE 106*  BUN 9  CREATININE 0.90  CALCIUM 8.8   PT/INR No results found for this basename: LABPROT, INR,  in the last 72 hours ABG No results found for this basename: PHART, PCO2, PO2, HCO3,  in the last 72 hours  MEDS, Scheduled . enoxaparin (LOVENOX) injection  40 mg Subcutaneous Q24H  . famotidine  20 mg Oral BID  . metoprolol  5 mg Intravenous 4 times per day  . morphine   Intravenous 6 times per day  . sulfaSALAzine  500 mg Oral QID    Studies/Results: No results found.  Assessment: s/p Procedure(s): Laparoscopic total abdominal colectomy and hernia repair Patient Active Problem List   Diagnosis Date Noted  . Nonspecific (abnormal)  findings on radiological and other examination of gastrointestinal tract 07/18/2013  . Acute esophagitis 07/18/2013  . Colon cancer 07/16/2013  . Hypovitaminosis D 05/06/2013  . Ulcerative colitis, unspecified 04/04/2013  . Ventral hernia 03/05/2013  . Knee pain, bilateral 03/05/2013    Pt less tachy with metoprolol.   Pt appears to have post op ileus vs temp obstruction at anastomotic site due to swelling.  Pt refuses ng.  Seems to be resolving.   Plan: Cont npo: pt refuses advance to clears today.  I think this is reasonable, but should be ready to advance diet tom Labs stable, HR better Cont MIV and PCA Ambulate, incentive spirometry    LOS: 6 days     .[facility]osario Adie [facility]  08/29/2013 8:23 AM

## 2013-08-30 MED ORDER — MORPHINE SULFATE 2 MG/ML IJ SOLN
2.0000 mg | INTRAMUSCULAR | Status: DC | PRN
  Administered 2013-08-30: 2 mg via INTRAVENOUS
  Administered 2013-08-30 – 2013-08-31 (×3): 4 mg via INTRAVENOUS
  Administered 2013-09-01 (×2): 2 mg via INTRAVENOUS
  Filled 2013-08-30 (×2): qty 2
  Filled 2013-08-30 (×3): qty 1
  Filled 2013-08-30: qty 2

## 2013-08-30 MED ORDER — CYCLOBENZAPRINE HCL 10 MG PO TABS
10.0000 mg | ORAL_TABLET | Freq: Once | ORAL | Status: AC
  Administered 2013-08-30: 10 mg via ORAL
  Filled 2013-08-30: qty 1

## 2013-08-30 MED ORDER — METOCLOPRAMIDE HCL 5 MG/ML IJ SOLN
5.0000 mg | Freq: Three times a day (TID) | INTRAMUSCULAR | Status: DC
  Administered 2013-08-30 – 2013-09-03 (×17): 5 mg via INTRAVENOUS
  Filled 2013-08-30 (×8): qty 1
  Filled 2013-08-30: qty 2
  Filled 2013-08-30: qty 1
  Filled 2013-08-30: qty 2
  Filled 2013-08-30 (×3): qty 1
  Filled 2013-08-30: qty 2
  Filled 2013-08-30 (×5): qty 1
  Filled 2013-08-30: qty 2

## 2013-08-30 NOTE — Progress Notes (Signed)
Physical Therapy Treatment Patient Details Name: Robert Rhodes MRN: [id number] DOB: 1946/05/20 Today's Date: 08/30/2013 Time: 5366-4403 PT Time Calculation (min): 25 min  PT Assessment / Plan / Recommendation  History of Present Illness     PT Comments   Pt very motivated and loves to walk.  Amb around unit 7 times while pushing IV pole.  Pt cont to suffer from hiccups and ABD distention.  No pain more "tightness". Tolerated session well.  Follow Up Recommendations  No PT follow up;Supervision/Assistance - 24 hour     Does the patient have the potential to tolerate intense rehabilitation     Barriers to Discharge        Equipment Recommendations       Recommendations for Other Services    Frequency Min 3X/week   Progress towards PT Goals Progress towards PT goals: Progressing toward goals  Plan      Precautions / Restrictions Precautions Precautions: Fall Restrictions Weight Bearing Restrictions: No   Pertinent Vitals/Pain     Mobility  Bed Mobility General bed mobility comments: Pt OOB in recliner Transfers Overall transfer level: Needs assistance Equipment used: None (IV pole) Transfers: Sit to/from Stand Sit to Stand: Supervision General transfer comment: increased time Ambulation/Gait Ambulation/Gait assistance: Supervision;Min guard Ambulation Distance (Feet): 950 Feet Assistive device: None (IV pole) Gait Pattern/deviations: Step-through pattern Gait velocity: WFL General Gait Details: <25% VC's on safety reguarding IV pole legs/tripping hazzard     PT Goals (current goals can now be found in the care plan section)    Visit Information  Last PT Received On: 08/30/13 Assistance Needed: +1    Subjective Data      Cognition       Balance     End of Session PT - End of Session Equipment Utilized During Treatment: Gait belt Activity Tolerance: Patient tolerated treatment well Patient left: in chair;with call bell/phone within reach   Rica Koyanagi  PTA [facility]  Acute  Rehab Pager      [phone]

## 2013-08-30 NOTE — Progress Notes (Signed)
INITIAL NUTRITION ASSESSMENT  DOCUMENTATION CODES Per approved criteria  -Obesity Unspecified   INTERVENTION: - Diet advancement per MD - If abdominal distention persists and diet unable to be advanced in next 1-2 days, recommend TPN - Recommend MD order vitamin D as per chart, pt with vitamin D deficiency (on 1,000 units daily at home) - Will continue to monitor   NUTRITION DIAGNOSIS: Inadequate oral intake related to clear liquid diet as evidenced by diet order.   Goal: Advance diet as tolerated to regular diet  Monitor:  Weights, labs, diet advancement  Reason for Assessment: NPO/clear liquids x 7 days  68 y.o. male  Admitting Dx: laparoscopic total abdominal colectomy   ASSESSMENT: Pt with long-standing history of ulcerative colitis. His most recent colonoscopy showed minimal inflammation but a new cecal cancer. His CT scans are concerning for possible metastatic disease and also showed evidence of a recurrent ventral hernia. He has underwent an EGD, which showed esophagitis and no signs of carcinoma. Admitted for laparoscopic total abdominal colectomy which was performed 08/23/13 with hernia repair. Had some bloating post-op, improved 3/16 but then worsened with abdominal distention 3/17. Had NGT placed for a short time on 3/17 but then pt removed it, pt vomited multiple times during placement. NGT placed again 3/18 with 269m brown fluid removed, but then removed due to pt intolerance. Attempted NGT again last night and got out 650 ml of clear fluid.   Met with who reports good appetite at home, was eating 2 good meals/day and 1 light meal/day. Is a vegetarian. Was using daily fiber powder. Denies any changes in weight. C/o hiccups and ongoing bloating and distention. States nausea occurs on/off. Had some jello for breakfast which he is eating slowly.  Per MD notes, pt with post-op ileus versus temporary obstruction at anastomotic site.   Potassium low, getting IV  replacement  Height: Ht Readings from Last 1 Encounters:  08/23/13 5' 9"  (1.753 m)    Weight: Wt Readings from Last 1 Encounters:  08/23/13 221 lb (100.245 kg)    Ideal Body Weight: 160 lb   % Ideal Body Weight: 138%  Wt Readings from Last 10 Encounters:  08/23/13 221 lb (100.245 kg)  08/23/13 221 lb (100.245 kg)  08/19/13 221 lb 6 oz (100.415 kg)  07/16/13 222 lb 1.6 oz (100.744 kg)  07/15/13 222 lb 8 oz (100.925 kg)  06/20/13 222 lb 8 oz (100.925 kg)  05/06/13 216 lb (97.977 kg)  04/04/13 209 lb 9.6 oz (95.074 kg)  03/05/13 209 lb (94.802 kg)    Usual Body Weight: 221 lb   % Usual Body Weight: 100%  BMI:  Body mass index is 32.62 kg/(m^2). Class I obesity  Estimated Nutritional Needs: Kcal: 16195-[address]32Protein: 110-120g Fluid: 1.7-1.9L/day  Skin: Abdominal incision  Diet Order: Clear Liquid  EDUCATION NEEDS: -No education needs identified at this time   Intake/Output Summary (Last 24 hours) at 08/30/13 1006 Last data filed at 08/30/13 0933  Gross per 24 hour  Intake   1420 ml  Output   1985 ml  Net   -565 ml    Last BM: 3/19  Labs:   Recent Labs Lab 08/25/13 0524 08/26/13 0445 08/29/13 0510  NA 137 135* 139  K 3.7 3.5* 3.5*  CL 99 96 98  CO2 30 29 33*  BUN 9 8 9   CREATININE 0.98 0.99 0.90  CALCIUM 8.9 8.9 8.8  MG  --   --  2.2  PHOS  --   --  2.8  GLUCOSE 104* 106* 106*    CBG (last 3)  No results found for this basename: GLUCAP,  in the last 72 hours  Scheduled Meds: . enoxaparin (LOVENOX) injection  40 mg Subcutaneous Q24H  . famotidine  20 mg Oral BID  . metoCLOPramide (REGLAN) injection  5 mg Intravenous TID AC & HS  . metoprolol  5 mg Intravenous 4 times per day  . sulfaSALAzine  500 mg Oral QID    Continuous Infusions: . dextrose 5 % and 0.45 % NaCl with KCl 20 mEq/L 75 mL/hr at 08/30/13 0345    Past Medical History  Diagnosis Date  . Vitamin D deficiency   . UC (ulcerative colitis)   . Arthritis   . Osteoporosis    . GERD (gastroesophageal reflux disease)     Past Surgical History  Procedure Laterality Date  . Ventral hernia repair    . Eus N/A 07/18/2013    Procedure: UPPER ENDOSCOPIC ULTRASOUND (EUS) RADIAL;  Surgeon: Milus Banister, MD;  Location: [facility];  Service: Endoscopy;  Laterality: N/A;  . Intraocular lens insertion Bilateral     6 yrs ago  . Colon resection N/A 08/23/2013    Procedure: Laparoscopic total abdominal colectomy and hernia repair;  Surgeon: Leighton Ruff, MD;  Location: [facility];  Service: General;  Laterality: N/A;    Mikey College MS, [place], [place] Pager [phone] After Hours Pager

## 2013-08-30 NOTE — Progress Notes (Signed)
Patient with complaints of hiccups for the past 2-3 days PA Earnstine Regal notified and order given for flexeril 10 mg oral once, order entered Neta Mends RN 08-30-2013 15:44pm

## 2013-08-30 NOTE — Progress Notes (Signed)
7 Days Post-Op Lap TAC with IRA for UC and colon cancer Subjective: Feels less bloated, denies nausea, having some flatus, having some small liquid BM's.  Attempted ng again last night and got out 650 ml of clear fluid.  Continues to refuse abd binder.  Pt ambulating well.   Objective: Vital signs in last 24 hours: Temp:  [97.4 F (36.3 C)-99.3 F (37.4 C)] 97.8 F (36.6 C) (03/20 0609) Pulse Rate:  [83-96] 90 (03/20 0609) Resp:  [15-20] 18 (03/20 0811) BP: (129-147)/(64-87) 146/87 mmHg (03/20 0609) SpO2:  [94 %-100 %] 100 % (03/20 0811) FiO2 (%):  [42 %-45 %] 42 % (03/20 0201)   Intake/Output from previous day: 03/19 0701 - 03/20 0700 In: 1741.3 [I.V.:1741.3] Out: 1505 [Urine:775; Emesis/NG output:650; Drains:80] Intake/Output this shift: Total I/O In: -  Out: 300 [Urine:300] General appearance: alert and cooperative GI: normal findings: soft, non-tender and abnormal findings: less distended Hernia palpated, most likely dehisced fascia with vomiting Incision: no significant drainage, no significant erythema   Lab Results:   Recent Labs  08/28/13 0440  WBC 10.7*  HGB 12.0*  HCT 36.4*  PLT 351   BMET  Recent Labs  08/29/13 0510  NA 139  K 3.5*  CL 98  CO2 33*  GLUCOSE 106*  BUN 9  CREATININE 0.90  CALCIUM 8.8   PT/INR No results found for this basename: LABPROT, INR,  in the last 72 hours ABG No results found for this basename: PHART, PCO2, PO2, HCO3,  in the last 72 hours  MEDS, Scheduled . enoxaparin (LOVENOX) injection  40 mg Subcutaneous Q24H  . famotidine  20 mg Oral BID  . metoCLOPramide (REGLAN) injection  5 mg Intravenous TID AC & HS  . metoprolol  5 mg Intravenous 4 times per day  . sulfaSALAzine  500 mg Oral QID    Studies/Results: No results found.  Assessment: s/p Procedure(s): Laparoscopic total abdominal colectomy and hernia repair Patient Active Problem List   Diagnosis Date Noted  . Nonspecific (abnormal) findings on  radiological and other examination of gastrointestinal tract 07/18/2013  . Acute esophagitis 07/18/2013  . Colon cancer 07/16/2013  . Hypovitaminosis D 05/06/2013  . Ulcerative colitis, unspecified 04/04/2013  . Ventral hernia 03/05/2013  . Knee pain, bilateral 03/05/2013      Pt appears to have post op ileus vs temp obstruction at anastomotic site due to swelling.  Pt refuses ng but has had it placed intermittently to relieve symptoms.  Seems to be resolving.   Plan: Will start some clears today with reglan D/C PCA per pt request, PRN Morphine Cont MIV  Ambulate, incentive spirometry  If no substantial improvement in diet tolerance over the next 48h, will need to start TPN  LOS: 7 days     .Rosario Adie, [facility], [place]   08/30/2013 9:10 AM

## 2013-08-31 LAB — CBC
HCT: 33.4 % — ABNORMAL LOW (ref 39.0–52.0)
HEMOGLOBIN: 11.2 g/dL — AB (ref 13.0–17.0)
MCH: 28.4 pg (ref 26.0–34.0)
MCHC: 33.5 g/dL (ref 30.0–36.0)
MCV: 84.6 fL (ref 78.0–100.0)
Platelets: 404 10*3/uL — ABNORMAL HIGH (ref 150–400)
RBC: 3.95 MIL/uL — ABNORMAL LOW (ref 4.22–5.81)
RDW: 12.8 % (ref 11.5–15.5)
WBC: 6.7 10*3/uL (ref 4.0–10.5)

## 2013-08-31 LAB — BASIC METABOLIC PANEL
BUN: 4 mg/dL — AB (ref 6–23)
CHLORIDE: 99 meq/L (ref 96–112)
CO2: 29 mEq/L (ref 19–32)
Calcium: 8.8 mg/dL (ref 8.4–10.5)
Creatinine, Ser: 0.76 mg/dL (ref 0.50–1.35)
GFR calc non Af Amer: >90 mL/min (ref >90)
Glucose, Bld: 104 mg/dL — ABNORMAL HIGH (ref 70–99)
Potassium: 3 mEq/L — ABNORMAL LOW (ref 3.7–5.3)
SODIUM: 140 meq/L (ref 137–147)

## 2013-08-31 MED ORDER — CHLORPROMAZINE HCL 25 MG/ML IJ SOLN
25.0000 mg | Freq: Three times a day (TID) | INTRAMUSCULAR | Status: DC | PRN
  Administered 2013-08-31: 25 mg via INTRAMUSCULAR
  Filled 2013-08-31: qty 1

## 2013-08-31 MED ORDER — POTASSIUM CHLORIDE 10 MEQ/100ML IV SOLN
10.0000 meq | INTRAVENOUS | Status: AC
  Administered 2013-08-31 (×4): 10 meq via INTRAVENOUS
  Filled 2013-08-31 (×4): qty 100

## 2013-08-31 NOTE — Progress Notes (Signed)
8 Days Post-Op  Subjective: [name].  [name].  HAD BM.   Objective: Vital signs in last 24 hours: Temp:  [98.3 F (36.8 C)-99.5 F (37.5 C)] 98.3 F (36.8 C) (03/21 0659) Pulse Rate:  [77-102] 77 (03/21 0659) Resp:  [16-18] 16 (03/21 0659) BP: (115-150)/(66-85) 144/85 mmHg (03/21 0659) SpO2:  [96 %-98 %] 98 % (03/21 0659) Last BM Date: 08/30/13  Intake/Output from previous day: 03/20 0701 - 03/21 0700 In: 1947.5 [P.O.:60; I.V.:1887.5] Out: 1830 [Urine:1800; Drains:30] Intake/Output this shift:    Incision/Wound:DISTENDED ABDOMEN  BUT SOFT.  INCISION LOOKS INTACT.  NOT TENDER.   Lab Results:   Recent Labs  08/31/13 0455  WBC 6.7  HGB 11.2*  HCT 33.4*  PLT 404*   BMET  Recent Labs  08/29/13 0510 08/31/13 0455  NA 139 140  K 3.5* 3.0*  CL 98 99  CO2 33* 29  GLUCOSE 106* 104*  BUN 9 4*  CREATININE 0.90 0.76  CALCIUM 8.8 8.8   PT/INR No results found for this basename: LABPROT, INR,  in the last 72 hours ABG No results found for this basename: PHART, PCO2, PO2, HCO3,  in the last 72 hours  Studies/Results: No results found.  Anti-infectives: Anti-infectives   Start     Dose/Rate Route Frequency Ordered Stop   08/23/13 1600  cefoTEtan (CEFOTAN) 2 g in dextrose 5 % 50 mL IVPB     2 g 100 mL/hr over 30 Minutes Intravenous Every 12 hours 08/23/13 1526 08/23/13 1730   08/23/13 0522  cefoTEtan (CEFOTAN) 2 g in dextrose 5 % 50 mL IVPB     2 g 100 mL/hr over 30 Minutes Intravenous On call to O.R. 08/23/13 0522 08/23/13 0758      Assessment/Plan: s/p Procedure(s): Laparoscopic total abdominal colectomy and hernia repair (N/A) ILEUS HAD A BM  HYPOKALEMIA REPLACE [name] TRY THORAZINE HOLD ON DIET FOR NOW STILL WITH DISTENTION.    LOS: 8 days    Robert Rhodes A. 08/31/2013

## 2013-09-01 DIAGNOSIS — E876 Hypokalemia: Secondary | ICD-10-CM

## 2013-09-01 LAB — COMPREHENSIVE METABOLIC PANEL
ALK PHOS: 72 U/L (ref 39–117)
ALT: 131 U/L — ABNORMAL HIGH (ref 0–53)
AST: 72 U/L — ABNORMAL HIGH (ref 0–37)
Albumin: 2.7 g/dL — ABNORMAL LOW (ref 3.5–5.2)
BILIRUBIN TOTAL: 0.7 mg/dL (ref 0.3–1.2)
BUN: 4 mg/dL — ABNORMAL LOW (ref 6–23)
CHLORIDE: 99 meq/L (ref 96–112)
CO2: 28 mEq/L (ref 19–32)
Calcium: 8.7 mg/dL (ref 8.4–10.5)
Creatinine, Ser: 0.82 mg/dL (ref 0.50–1.35)
GFR calc non Af Amer: 88 mL/min — ABNORMAL LOW (ref >90)
GLUCOSE: 103 mg/dL — AB (ref 70–99)
POTASSIUM: 2.9 meq/L — AB (ref 3.7–5.3)
Sodium: 138 mEq/L (ref 137–147)
Total Protein: 6.2 g/dL (ref 6.0–8.3)

## 2013-09-01 LAB — BASIC METABOLIC PANEL
BUN: 5 mg/dL — ABNORMAL LOW (ref 6–23)
CO2: 27 mEq/L (ref 19–32)
CREATININE: 0.89 mg/dL (ref 0.50–1.35)
Calcium: 9.2 mg/dL (ref 8.4–10.5)
Chloride: 100 mEq/L (ref 96–112)
GFR, EST NON AFRICAN AMERICAN: 85 mL/min — AB (ref >90)
Glucose, Bld: 99 mg/dL (ref 70–99)
POTASSIUM: 3.8 meq/L (ref 3.7–5.3)
Sodium: 139 mEq/L (ref 137–147)

## 2013-09-01 LAB — CBC
HCT: 30.9 % — ABNORMAL LOW (ref 39.0–52.0)
Hemoglobin: 10.7 g/dL — ABNORMAL LOW (ref 13.0–17.0)
MCH: 28.8 pg (ref 26.0–34.0)
MCHC: 34.6 g/dL (ref 30.0–36.0)
MCV: 83.3 fL (ref 78.0–100.0)
PLATELETS: 459 10*3/uL — AB (ref 150–400)
RBC: 3.71 MIL/uL — ABNORMAL LOW (ref 4.22–5.81)
RDW: 13 % (ref 11.5–15.5)
WBC: 6.5 10*3/uL (ref 4.0–10.5)

## 2013-09-01 MED ORDER — ONDANSETRON HCL 4 MG/2ML IJ SOLN
INTRAMUSCULAR | Status: AC
  Filled 2013-09-01: qty 2

## 2013-09-01 MED ORDER — ONDANSETRON HCL 4 MG/2ML IJ SOLN
4.0000 mg | Freq: Four times a day (QID) | INTRAMUSCULAR | Status: DC | PRN
  Administered 2013-09-01: 4 mg via INTRAVENOUS

## 2013-09-01 MED ORDER — POTASSIUM CHLORIDE 10 MEQ/100ML IV SOLN
10.0000 meq | INTRAVENOUS | Status: AC
  Administered 2013-09-01 (×5): 10 meq via INTRAVENOUS
  Filled 2013-09-01 (×5): qty 100

## 2013-09-01 NOTE — Progress Notes (Signed)
CRITICAL VALUE ALERT  Critical value received:  K+: 2.9  Date of notification:  09/01/2013  Time of notification:  0636  Critical value read back:yes  Nurse who received alert:  Raynald Blend, RN  MD notified (1st page):  Shann Medal, MD  Time of first page:  [phone]  MD notified (2nd page):  Time of second page:  Responding MD:  Shann Medal, MD  Time MD responded:  [phone]

## 2013-09-01 NOTE — Progress Notes (Signed)
9 Days Post-Op  Subjective: Vomiting overnight.  Had BM though last night and this am. [name] are gone and he is happy about that.   Objective: Vital signs in last 24 hours: Temp:  [97.4 F (36.3 C)-98.8 F (37.1 C)] 98.8 F (37.1 C) (03/22 0628) Pulse Rate:  [85-100] 86 (03/22 0628) Resp:  [18] 18 (03/22 0628) BP: (120-156)/(65-87) 124/74 mmHg (03/22 0628) SpO2:  [95 %-100 %] 95 % (03/22 0628) Last BM Date: 08/30/13  Intake/Output from previous day: 03/21 0701 - 03/22 0700 In: 1275 [I.V.:875; IV Piggyback:400] Out: 950 [Urine:950] Intake/Output this shift:    Abdomen:  Distended but soft. Incision intact.  No significant tenderness.   Lab Results:   Recent Labs  08/31/13 0455 09/01/13 0532  WBC 6.7 6.5  HGB 11.2* 10.7*  HCT 33.4* 30.9*  PLT 404* 459*   BMET  Recent Labs  08/31/13 0455 09/01/13 0532  NA 140 138  K 3.0* 2.9*  CL 99 99  CO2 29 28  GLUCOSE 104* 103*  BUN 4* 4*  CREATININE 0.76 0.82  CALCIUM 8.8 8.7   PT/INR No results found for this basename: LABPROT, INR,  in the last 72 hours ABG No results found for this basename: PHART, PCO2, PO2, HCO3,  in the last 72 hours  Studies/Results: No results found.  Anti-infectives: Anti-infectives   Start     Dose/Rate Route Frequency Ordered Stop   08/23/13 1600  cefoTEtan (CEFOTAN) 2 g in dextrose 5 % 50 mL IVPB     2 g 100 mL/hr over 30 Minutes Intravenous Every 12 hours 08/23/13 1526 08/23/13 1730   08/23/13 0522  cefoTEtan (CEFOTAN) 2 g in dextrose 5 % 50 mL IVPB     2 g 100 mL/hr over 30 Minutes Intravenous On call to O.R. 08/23/13 0522 08/23/13 0758      Assessment/Plan: s/p Procedure(s): Laparoscopic total abdominal colectomy and hernia repair (N/A) hypokalmia replace today and check BMP after Ileus  Improving try clears [name] better.   LOS: 9 days    Amara Manalang A. 09/01/2013

## 2013-09-02 LAB — BASIC METABOLIC PANEL
BUN: 4 mg/dL — ABNORMAL LOW (ref 6–23)
CALCIUM: 8.8 mg/dL (ref 8.4–10.5)
CO2: 26 meq/L (ref 19–32)
Chloride: 103 mEq/L (ref 96–112)
Creatinine, Ser: 0.86 mg/dL (ref 0.50–1.35)
GFR calc Af Amer: >90 mL/min (ref >90)
GFR calc non Af Amer: 86 mL/min — ABNORMAL LOW (ref >90)
Glucose, Bld: 110 mg/dL — ABNORMAL HIGH (ref 70–99)
Potassium: 3.4 mEq/L — ABNORMAL LOW (ref 3.7–5.3)
SODIUM: 141 meq/L (ref 137–147)

## 2013-09-02 MED ORDER — POTASSIUM CHLORIDE 10 MEQ/100ML IV SOLN
10.0000 meq | INTRAVENOUS | Status: AC
  Administered 2013-09-02 (×4): 10 meq via INTRAVENOUS
  Filled 2013-09-02 (×4): qty 100

## 2013-09-02 MED ORDER — ENSURE COMPLETE PO LIQD
237.0000 mL | Freq: Two times a day (BID) | ORAL | Status: DC
  Administered 2013-09-02 – 2013-09-03 (×3): 237 mL via ORAL

## 2013-09-02 MED ORDER — OXYCODONE HCL 5 MG PO TABS: 5.0000 mg | ORAL_TABLET | ORAL | Status: DC | PRN

## 2013-09-02 NOTE — Progress Notes (Signed)
10 Days Post-Op  Subjective: Feels better.  Tolerating clears.  Having BM's.  Objective: Vital signs in last 24 hours: Temp:  [98 F (36.7 C)-98.2 F (36.8 C)] 98 F (36.7 C) (03/23 0735) Pulse Rate:  [74-103] 103 (03/23 0735) Resp:  [16-18] 18 (03/23 0735) BP: (116-131)/(73-84) 121/74 mmHg (03/23 0735) SpO2:  [94 %-97 %] 94 % (03/23 0735) Last BM Date: 08/31/13  Intake/Output from previous day: 03/22 0701 - 03/23 0700 In: 900 [I.V.:900] Out: 1150 [Urine:1150] Intake/Output this shift:    Abdomen:  Distended but soft. Incision intact with brownish discharge, no purulence or erythema noted.  No significant tenderness.   Lab Results:   Recent Labs  08/31/13 0455 09/01/13 0532  WBC 6.7 6.5  HGB 11.2* 10.7*  HCT 33.4* 30.9*  PLT 404* 459*   BMET  Recent Labs  09/01/13 1802 09/02/13 0453  NA 139 141  K 3.8 3.4*  CL 100 103  CO2 27 26  GLUCOSE 99 110*  BUN 5* 4*  CREATININE 0.89 0.86  CALCIUM 9.2 8.8   PT/INR No results found for this basename: LABPROT, INR,  in the last 72 hours ABG No results found for this basename: PHART, PCO2, PO2, HCO3,  in the last 72 hours  Studies/Results: No results found.  Anti-infectives: Anti-infectives   Start     Dose/Rate Route Frequency Ordered Stop   08/23/13 1600  cefoTEtan (CEFOTAN) 2 g in dextrose 5 % 50 mL IVPB     2 g 100 mL/hr over 30 Minutes Intravenous Every 12 hours 08/23/13 1526 08/23/13 1730   08/23/13 0522  cefoTEtan (CEFOTAN) 2 g in dextrose 5 % 50 mL IVPB     2 g 100 mL/hr over 30 Minutes Intravenous On call to O.R. 08/23/13 0522 08/23/13 0758      Assessment/Plan: s/p Procedure(s): Laparoscopic total abdominal colectomy and hernia repair (N/A) hypokalmia replace today  Ileus  Improving try reg diet, ensure Cont ambulation Must wear binder at all times  LOS: 10 days    Ossie Beltran C. 3/41/9622

## 2013-09-02 NOTE — Progress Notes (Signed)
Physical Therapy Treatment Patient Details Name: Robert Rhodes MRN: [id number] DOB: 14-Jan-1946 Today's Date: 09/02/2013 Time: 7782-4235 PT Time Calculation (min): 29 min  PT Assessment / Plan / Recommendation  History of Present Illness     PT Comments   Assisted pt out of bathroom to amb in hallway several laps.  Next visit will amb without holding to IV pole to determine if an AD is needed.   Follow Up Recommendations  No PT follow up     Does the patient have the potential to tolerate intense rehabilitation     Barriers to Discharge        Equipment Recommendations       Recommendations for Other Services    Frequency Min 3X/week   Progress towards PT Goals Progress towards PT goals: Progressing toward goals  Plan      Precautions / Restrictions Precautions Precautions: Fall Restrictions Weight Bearing Restrictions: No   Pertinent Vitals/Pain     Mobility  Bed Mobility General bed mobility comments: Pt OOB in recliner Transfers Overall transfer level: Modified independent Equipment used: None Transfers: Sit to/from Stand Sit to Stand: Modified independent (Device/Increase time) General transfer comment: increased time Ambulation/Gait Ambulation/Gait assistance: Supervision Ambulation Distance (Feet): 1050 Feet Assistive device: None Gait Pattern/deviations: Step-through pattern Gait velocity: WFL General Gait Details: good alternating gait with good safety cognition     Exercises     PT Diagnosis:    PT Problem List:   PT Treatment Interventions:     PT Goals (current goals can now be found in the care plan section)    Visit Information  Last PT Received On: 09/02/13 Assistance Needed: +1    Subjective Data      Cognition       Balance     End of Session PT - End of Session Equipment Utilized During Treatment: Gait belt Activity Tolerance: Patient tolerated treatment well Patient left: in chair;with call bell/phone within reach   Rica Koyanagi  PTA [facility]  Acute  Rehab Pager      [phone]

## 2013-09-03 MED ORDER — OXYCODONE HCL 5 MG PO TABS: 5.0000 mg | ORAL_TABLET | ORAL | Status: DC | PRN

## 2013-09-03 NOTE — Discharge Instructions (Signed)

## 2013-09-03 NOTE — Progress Notes (Signed)
Came to visit patient at bedside to offer [facility] services. Patient to discharge home today. Consents obtained. Explained that he will receive post hospital discharge call and will be evaluated for monthly home visits. Confirmed best contact information. Left [facility] Care Management packet at bedside.  Marthenia Rolling, MSN- RN, [facility] [phone]

## 2013-09-03 NOTE — Discharge Summary (Signed)
Physician Discharge Summary  Patient ID: Robert Rhodes MRN: [id number] DOB/AGE: 70-Jul-1947 70 y.o.  Admit date: 08/23/2013 Discharge date: 09/03/2013  Admission Diagnoses: Colon cancer, Ulcerative Colitis  Discharge Diagnoses:  Active Problems:   Colon cancer Ulcerative colitis  Discharged Condition: good  Hospital Course: This is a 68 year old male who underwent total abdominal colectomy with ileorectal anastomosis for ulcerative colitis and a right-sided colon cancer. His postoperative course was eventful only for a prolonged postop ileus. His Foley catheter was removed by postop day 2. He remained on the PCA for pain control. His ileus resolved by postop day 7. His diet was advanced slowly.  By his discharge date he was tolerating a regular diet and using minimal narcotics. He was deemed in stable condition for discharge to home.  Consults: None  Significant Diagnostic Studies: labs: CBC and chemistry, abdominal x-ray showing postoperative ileus  Treatments: IV hydration and analgesia: Morphine  Discharge Exam: Blood pressure 145/82, pulse 95, temperature 98.3 F (36.8 C), temperature source Oral, resp. rate 18, height 5' 9"  (1.753 m), weight 221 lb (100.245 kg), SpO2 98.00%. General appearance: alert and cooperative GI: normal findings: soft, non-tender Incision/Wound:Hernia palpated underneath. Overlying wound is intact and without signs of erythema or infection. There is minimal drainage on the dressing which is dark brown and appears to be old blood.  Disposition: 01-Home or Self Care   Future Appointments Provider Department Dept Phone   2/70/7867 5:44 AM Leighton Ruff, MD [facility], [place]   09/11/2013 11:00 AM Ladell Pier, [facility] [phone]       Medication List         AMBULATORY NON FORMULARY MEDICATION  - Slippery elm  - 1 tablet by mouth once daily     AMBULATORY NON FORMULARY MEDICATION  -  Peppermint Oil  - One capsule by mouth three times a day as needed     AMBULATORY NON FORMULARY MEDICATION  Take 15 mLs by mouth daily. Raspberry fiber blend     AMBULATORY NON FORMULARY MEDICATION  - Artichoke   - Take 1 capsule by mouth daily     cholecalciferol 1000 UNITS tablet  Commonly known as:  VITAMIN D  Take 1,000 Units by mouth daily.     famotidine 20 MG tablet  Commonly known as:  PEPCID  Take 1 tablet (20 mg total) by mouth 2 (two) times daily.     oxyCODONE 5 MG immediate release tablet  Commonly known as:  Oxy IR/ROXICODONE  Take 1 tablet (5 mg total) by mouth every 4 (four) hours as needed for moderate pain, severe pain or breakthrough pain.     Saw Palmetto 450 MG Caps  Take 2 capsules by mouth 2 (two) times daily.     sulfaSALAzine 500 MG tablet  Commonly known as:  AZULFIDINE  Take 1 tablet (500 mg total) by mouth 4 (four) times daily.     vitamin A 10000 UNIT capsule  Take 10,000 Units by mouth daily.     vitamin E 100 UNIT capsule  Take 100 Units by mouth daily.           Follow-up Information   Follow up with Rosario Adie., MD In 2 weeks.   Specialty:  General Surgery   Contact information:   [address] [place] [address] [phone]       Signed: Rosario Adie 09/04/4980, 6:41 AM

## 2013-09-04 ENCOUNTER — Encounter (INDEPENDENT_AMBULATORY_CARE_PROVIDER_SITE_OTHER): Payer: Medicare HMO | Admitting: General Surgery

## 2013-09-09 ENCOUNTER — Ambulatory Visit: Payer: Medicare HMO

## 2013-09-10 ENCOUNTER — Ambulatory Visit: Payer: Medicare HMO | Admitting: Oncology

## 2013-09-11 ENCOUNTER — Ambulatory Visit (HOSPITAL_BASED_OUTPATIENT_CLINIC_OR_DEPARTMENT_OTHER): Payer: Commercial Managed Care - HMO | Admitting: Oncology

## 2013-09-11 VITALS — BP 156/71 | HR 91 | Temp 98.2°F | Resp 18 | Ht 69.0 in | Wt 211.3 lb

## 2013-09-11 DIAGNOSIS — C189 Malignant neoplasm of colon, unspecified: Secondary | ICD-10-CM

## 2013-09-11 DIAGNOSIS — C180 Malignant neoplasm of cecum: Secondary | ICD-10-CM

## 2013-09-11 DIAGNOSIS — K5190 Ulcerative colitis, unspecified, without complications: Secondary | ICD-10-CM

## 2013-09-11 DIAGNOSIS — N400 Enlarged prostate without lower urinary tract symptoms: Secondary | ICD-10-CM

## 2013-09-11 DIAGNOSIS — K7689 Other specified diseases of liver: Secondary | ICD-10-CM

## 2013-09-11 NOTE — Progress Notes (Signed)
  [facility] OFFICE PROGRESS NOTE   Diagnosis: Colon cancer  INTERVAL HISTORY:   Robert Rhodes underwent a laparoscopic total abdominal colectomy and hernia repair by Dr. Marcello Moores on 08/23/2013. A mass was noted at the ileocecal valve. And ileorectal anastomosis was created at 15 cm from the anal verge.  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 The resection margins were negative. No lymphovascular invasion. 22 lymph nodes were negative for metastatic carcinoma.  He reports an uneventful operative recovery. He has frequent bowel movements since surgery.  Objective:  Vital signs in last 24 hours:  Blood pressure 156/71, pulse 91, temperature 98.2 F (36.8 C), temperature source Oral, resp. rate 18, height 5' 9"  (1.753 m), weight 211 lb 4.8 oz (95.845 kg), SpO2 100.00%.    HEENT: Neck without mass Lymphatics: No cervical, supraclavicular, or inguinal nodes. "Shotty "bilateral axillary nodes. Resp: Lungs clear bilaterally Cardio: Regular rate and rhythm GI: No hepatomegaly, gauze dressing over the surgical incision. Vascular: No leg edema   Lab Results:   Lab Results  Component Value Date   CEA 1.2 07/16/2013    Imaging:  No results found.  Medications: I have reviewed the patient's current medications.  Assessment/Plan: 1. Adenocarcinoma of the cecum , status post a laparoscopic total abdominal colectomy and ileorectal anastomosis 08/23/2013 confirming a stage I (T2 N0) moderately differentiated adenocarcinoma 2. indeterminate liver lesions noted on a CT 06/28/2013. 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 Prominent lymph nodes adjacent to the distal esophagus on the CT 06/28/2013  4. Soft tissue asymmetry at the lateral left supraclavicular fossa on the CT 06/28/2013  5. Ulcerative colitis, minimally  active colitis noted on a random biopsy of the rectum 06/25/2013  6. Enlarged prostate on the CT 06/28/2013, symptoms of prostatic hypertrophy  7. ventral hernia -status post surgical repair 08/23/2013   Disposition:  Robert Rhodes underwent a total abdominal colectomy and was found to have a stage I adenocarcinoma of the cecum. He has an excellent prognosis for a long-term disease-free survival. There is no indication for adjuvant systemic therapy.  He will continue surveillance of the rectum with Dr. Marcello Moores and Dr.Pyrtle. He is not scheduled for a followup appointment at the [facility]. We will see him in the future as needed.  I will present his case at the GI tumor conference to discuss the indication for CT surveillance of the periesophageal lymph nodes seen on the CT 06/28/2013.  Betsy Coder, MD  09/11/2013  11:14 AM

## 2013-09-19 ENCOUNTER — Encounter (INDEPENDENT_AMBULATORY_CARE_PROVIDER_SITE_OTHER): Payer: Self-pay | Admitting: General Surgery

## 2013-09-19 ENCOUNTER — Ambulatory Visit (INDEPENDENT_AMBULATORY_CARE_PROVIDER_SITE_OTHER): Payer: Commercial Managed Care - HMO | Admitting: General Surgery

## 2013-09-19 VITALS — BP 118/80 | HR 80 | Temp 97.2°F | Resp 16 | Ht 69.0 in | Wt 211.2 lb

## 2013-09-19 DIAGNOSIS — C189 Malignant neoplasm of colon, unspecified: Secondary | ICD-10-CM

## 2013-09-19 NOTE — Progress Notes (Signed)
Robert Rhodes is a 68 y.o. male who is status post a TAC with [facility] on 3/13 for UC and a cecal cancer.  He is doing well. He does complain of some bloating. He is eating well. He has a good appetite. He is not losing weight. His 778 bowel movements a day. He is urinating several times a day and denies any symptoms of dehydration.  He is wearing his abdominal binder for his hernia. He feels that this is working well for him.  He was seen by Dr. Benay Spice for evaluation of his Stage I colon cancer, no indication for adjuvant therapy.    Objective: Filed Vitals:   09/19/13 1604  BP: 118/80  Pulse: 80  Temp: 97.2 F (36.2 C)  Resp: 16    General appearance: alert and cooperative GI: normal findings: soft, non-tender Hernia: nontender Incision: healing well   Assessment: s/p  Patient Active Problem List   Diagnosis Date Noted  . Nonspecific (abnormal) findings on radiological and other examination of gastrointestinal tract 07/18/2013  . Acute esophagitis 07/18/2013  . Colon cancer 07/16/2013  . Hypovitaminosis D 05/06/2013  . Ulcerative colitis, unspecified 04/04/2013  . Ventral hernia 03/05/2013  . Knee pain, bilateral 03/05/2013   Path reviewed. Stage I colon cancer with no signs of metastatic disease. He did have some enlarged paraesophageal nodes preoperatively. An EGD was performed which showed no periesophageal masses but did show inflammation. He was placed on appropriate therapy. We will need to repeat his CT sometime in the future to confirm that these have resolved with treatment. I think it would be very unlikely that this would be metastatic disease given his early staging.  Plan: I will like the patient to have a goal of 4-6 bowel movements a day. I've asked him to start a fiber supplement at home to help thicken up his stools. If he continues to have trouble with multiple bowel movements throughout the day, and basket to start using Imodium when necessary. I think his colon  will adjust over time and this will get better. I will see him back in the office for a 3 month followup and CEA level at that time.  I have counseled him to continue his sulfasalazine to treat his microscopic rectal inflammation.  The patient is aware that he will need yearly proctoscopy for surveillance of his ulcerative colitis.    Robert Rhodes, [facility], [place]   09/19/2013 4:31 PM

## 2013-09-19 NOTE — Patient Instructions (Signed)
Use a fiber supplement daily to help with her loose bowel movements. If you have more than 6 bowel movements a day, use 2 milligrams Imodium to help with this.  Continue sulfasalazine 500 mg.  Return to the office in 3 months.  Continue abdominal binder when upright.

## 2013-10-14 ENCOUNTER — Encounter: Payer: Self-pay | Admitting: Internal Medicine

## 2013-10-14 ENCOUNTER — Ambulatory Visit: Payer: Medicare HMO | Attending: Internal Medicine | Admitting: Internal Medicine

## 2013-10-14 VITALS — BP 139/73 | HR 63 | Temp 98.3°F | Resp 14 | Ht 69.0 in | Wt 215.0 lb

## 2013-10-14 DIAGNOSIS — Z09 Encounter for follow-up examination after completed treatment for conditions other than malignant neoplasm: Secondary | ICD-10-CM | POA: Diagnosis not present

## 2013-10-14 DIAGNOSIS — M25569 Pain in unspecified knee: Secondary | ICD-10-CM | POA: Diagnosis not present

## 2013-10-14 DIAGNOSIS — K5190 Ulcerative colitis, unspecified, without complications: Secondary | ICD-10-CM | POA: Diagnosis not present

## 2013-10-14 DIAGNOSIS — M25561 Pain in right knee: Secondary | ICD-10-CM

## 2013-10-14 DIAGNOSIS — Z79899 Other long term (current) drug therapy: Secondary | ICD-10-CM | POA: Diagnosis not present

## 2013-10-14 DIAGNOSIS — Z87891 Personal history of nicotine dependence: Secondary | ICD-10-CM | POA: Diagnosis not present

## 2013-10-14 DIAGNOSIS — M810 Age-related osteoporosis without current pathological fracture: Secondary | ICD-10-CM | POA: Insufficient documentation

## 2013-10-14 DIAGNOSIS — Z85038 Personal history of other malignant neoplasm of large intestine: Secondary | ICD-10-CM | POA: Diagnosis not present

## 2013-10-14 DIAGNOSIS — Z980 Intestinal bypass and anastomosis status: Secondary | ICD-10-CM | POA: Insufficient documentation

## 2013-10-14 DIAGNOSIS — E559 Vitamin D deficiency, unspecified: Secondary | ICD-10-CM | POA: Insufficient documentation

## 2013-10-14 DIAGNOSIS — M25562 Pain in left knee: Secondary | ICD-10-CM

## 2013-10-14 MED ORDER — TRAMADOL HCL 50 MG PO TABS: 50.0000 mg | ORAL_TABLET | Freq: Three times a day (TID) | ORAL | Status: DC | PRN

## 2013-10-14 MED ORDER — FAMOTIDINE 20 MG PO TABS: 20.0000 mg | ORAL_TABLET | Freq: Two times a day (BID) | ORAL | Status: DC

## 2013-10-14 MED ORDER — SULFASALAZINE 500 MG PO TABS: 500.0000 mg | ORAL_TABLET | Freq: Four times a day (QID) | ORAL | Status: DC

## 2013-10-14 NOTE — Progress Notes (Signed)
Pt is here following up after his colon cancer surgery.

## 2013-10-14 NOTE — Patient Instructions (Signed)
Ulcerative Colitis Ulcerative colitis is a long lasting swelling and soreness (inflammation) of the colon (large intestine). In patients with ulcerative colitis, sores (ulcers) and inflammation of the inner lining of the colon lead to illness. Ulcerative colitis can also cause problems outside the digestive tract.  Ulcerative colitis is closely related to another condition of inflammation of the intestines called Crohn's disease. Together, they are frequently referred to as inflammatory bowel disease (IBD). Ulcerative colitis and Crohn's diseases are conditions that can last years to decades. Men and women are affected equally. They most commonly begin during adolescence and early adulthood. SYMPTOMS  Common symptoms of ulcerative colitis include rectal bleeding and diarrhea. There is a wide range of symptoms among patients with this disease depending on how severe the disease is. Some of these symptoms are:  Abdominal pain or cramping.  Diarrhea.  Fever.  Tiredness (fatigue).  Weight loss.  Night sweats.  Rectal pain.  Feeling the immediate need to have a bowel movement (rectal urgency). CAUSES  Ulcerative colitis is caused by increased activity of the immune system in the intestines. The immune system is the system that protects the body against disease such as harmful bacteria, viruses, fungi, and other foreign invaders. When the immune system overacts, it causes inflammation. The cause of the increased immune system activity is not known. This over activity causes long-lasting inflammation and ulceration. This condition may be passed down from your parents (inherited). Brothers, sisters, children, and parents of patients with IBD are more likely to develop these diseases. It is not contagious. This means you cannot catch it from someone else. DIAGNOSIS  Your caregiver may suspect ulcerative colitis based on your symptoms and exam. Blood tests may confirm that there is a problem. You may  be asked to submit a stool specimen for examination. X-rays and CT scans may be necessary. Ultimately, the diagnosis is usually made after a flexible tube is inserted via your anus and your colon is examined under sedation (colonoscopy). With this test, the specialist can take a tiny tissue sample from inside the bowel (biopsy). Examination of this biopsy tissue under a microscopy can reveal ulcerative colitis as the cause of your symptoms. TREATMENT   There is no cure for ulcerative colitis.  Complications such as massive bleeding from the colon (hemorrhage), development of a hole in the colon (perforation), or the development of precancerous or cancerous changes of the colon may require surgery.  Medications are often used to decrease inflammation and control the immune system. These include medicines related to aspirin, steroid medications, and newer and stronger medications to slow down the immune system. Some medications may be used as suppositories or enemas. A number of other medications are used or have been studied. Your caregiver will make specific recommendations. HOME CARE INSTRUCTIONS   There is no cure for ulcerative colitis disease. The best treatment is frequent checkups with your caregiver. Periodic reevaluation is important.  Symptoms such as diarrhea can be controlled with medications. Avoid foods that have a laxative effect such fresh fruit and vegetables and dairy products. During flare ups, you can rest your bowel by staying away from solid foods. Drink clear liquids frequently during the day. Electrolyte or rehydrating fluids are best. Your caregiver can help you with suggestions. Drink often to prevent dehydration. When diarrhea has cleared, eat smaller meals and more often. Avoid food additives and stimulants such as caffeine (coffee, tea, many sodas, or chocolate). Avoid dairy products. Enzyme supplements may help if you develop intolerance to  a sugar in dairy products  (lactose). Ask your caregiver or dietitian about specific dietary instructions.  If you had surgery, be sure you understand your care instructions thoroughly, including proper care of any surgical wounds.  Take any medications exactly as prescribed.  Try to maintain a positive attitude. Learn relaxation techniques such as self hypnosis, mental imaging, and muscle relaxation. If possible, avoid stresses that aggravate your condition. Exercise regularly. Follow your diet. Always get plenty of rest. SEEK MEDICAL CARE IF:   Your symptoms fail to improve after a week or two of new treatment.  You experience continued weight loss.  You have ongoing crampy digestion or loose bowels.  You develop a new skin rash, skin sores, or eye problems. SEEK IMMEDIATE MEDICAL CARE IF:   You have worsening of your symptoms or develop new symptoms.  You have an oral temperature above 102 F (38.9 C), not controlled by medicine.  You develop bloody diarrhea.  You have severe abdominal pain. Document Released: 03/09/2005 Document Revised: 08/22/2011 Document Reviewed: 02/06/2007 [facility] Patient Information 2014 [place], [place].

## 2013-12-17 ENCOUNTER — Ambulatory Visit (INDEPENDENT_AMBULATORY_CARE_PROVIDER_SITE_OTHER): Payer: Commercial Managed Care - HMO | Admitting: General Surgery

## 2013-12-17 ENCOUNTER — Encounter (INDEPENDENT_AMBULATORY_CARE_PROVIDER_SITE_OTHER): Payer: Self-pay | Admitting: General Surgery

## 2013-12-17 VITALS — BP 140/80 | HR 81 | Temp 97.5°F | Resp 14 | Ht 69.0 in | Wt 221.8 lb

## 2013-12-17 DIAGNOSIS — K432 Incisional hernia without obstruction or gangrene: Secondary | ICD-10-CM

## 2013-12-17 DIAGNOSIS — C189 Malignant neoplasm of colon, unspecified: Secondary | ICD-10-CM

## 2013-12-17 NOTE — Progress Notes (Signed)
Robert Rhodes is a 68 y.o. male who is status post a TAC with [facility] on 3/13 for UC and a cecal cancer. He is doing well.  He is eating well. He has a good appetite. He is not losing weight. His ~3 bowel movements a day. He is urinating several times a day and denies any symptoms of dehydration. He is wearing his abdominal binder for his hernia. He feels that this is working well for him. He was seen by Dr. Benay Spice for evaluation of his Stage I colon cancer, no indication for adjuvant therapy.  He is ready to get his hernia fixed.  Past Medical History  Diagnosis Date  . Vitamin D deficiency   . UC (ulcerative colitis)   . Arthritis   . Osteoporosis   . GERD (gastroesophageal reflux disease)    Past Surgical History  Procedure Laterality Date  . Ventral hernia repair    . Eus N/A 07/18/2013    Procedure: UPPER ENDOSCOPIC ULTRASOUND (EUS) RADIAL;  Surgeon: Milus Banister, MD;  Location: [facility];  Service: Endoscopy;  Laterality: N/A;  . Intraocular lens insertion Bilateral     6 yrs ago  . Colon resection N/A 08/23/2013    Procedure: Laparoscopic total abdominal colectomy and hernia repair;  Surgeon: Leighton Ruff, MD;  Location: [facility];  Service: General;  Laterality: N/A;   Allergies  Allergen Reactions  . Ace Inhibitors   . Advil [Ibuprofen] Other (See Comments)    Muscle tightness  . Aspirin Nausea And Vomiting  . Tylenol [Acetaminophen] Other (See Comments)    Muscle tightness   Family History  Problem Relation Age of Onset  . Cancer Mother     type unknown  . Diabetes Father   . Heart disease Father   . Heart disease Sister   . Heart attack Brother    History   Social History  . Marital Status: Single    Spouse Name: N/A    Number of Children: 0  . Years of Education: N/A   Occupational History  . retired    Social History Main Topics  . Smoking status: Former Smoker    Types: Cigarettes    Quit date: 06/14/2003  . Smokeless tobacco: Never Used   Comment: quit in July 2005  . Alcohol Use: No     Comment: quit in 2005  . Drug Use: No  . Sexual Activity: Yes   Other Topics Concern  . Not on file   Social History Narrative  . No narrative on file     Objective:  Filed Vitals:   12/17/13 1118  BP: 140/80  Pulse: 81  Temp: 97.5 F (36.4 C)  Resp: 14    General appearance: alert and cooperative  CV: RRR Lungs: CTA GI: normal findings: soft, non-tender  Hernia: nontender except right lateral sidewall Incision: well healed  Assessment:  s/p  Patient Active Problem List    Diagnosis  Date Noted   .  Nonspecific (abnormal) findings on radiological and other examination of gastrointestinal tract  07/18/2013   .  Acute esophagitis  07/18/2013   .  Colon cancer  07/16/2013   .  Hypovitaminosis D  05/06/2013   .  Ulcerative colitis, unspecified  04/04/2013   .  Ventral hernia  03/05/2013   .  Knee pain, bilateral  03/05/2013    Stage I colon cancer with no signs of metastatic disease. He did have some enlarged paraesophageal nodes preoperatively. An EGD was performed which showed  no periesophageal masses but did show inflammation. He was placed on appropriate therapy. We will need to repeat his CT sometime in the future to confirm that these have resolved with treatment. I think it would be very unlikely that this would be metastatic disease given his early staging.  Plan:  We will schedule a laparoscopic hernia repair.  Risks and benefits were discussed with the patient.  The surgery and anatomy were described to the patient as well as the risks of surgery and the possible complications.  These include: Bleeding, deep abdominal infections and possible wound complications such as recurrent hernia and infection, damage to adjacent structures, possible need for other procedures, such as abscess drains in radiology, possible prolonged hospital stay,  possible constipation from narcotics, prolonged fatigue/weakness or appetite  loss,  possible complications of their medical problems such as heart disease or arrhythmias or lung problems, death (less than 1%). I believe the patient understands and wishes to proceed with the surgery.

## 2013-12-17 NOTE — Patient Instructions (Signed)
We will schedule you for hernia repair.

## 2013-12-30 NOTE — Progress Notes (Signed)
Patient ID: Robert Rhodes, male   DOB: 1946/03/12, 68 y.o.   MRN: [id number]   Robert Rhodes, is a 68 y.o. male  [id number]  [id number]  DOB - 1945/09/30  Chief Complaint  Patient presents with  . Follow-up        Subjective:   Robert Rhodes is a 68 y.o. male here today for a follow up visit. Patient underwent a laparoscopic total abdominal colectomy and hernia repair by Dr. Marcello Moores on 08/23/2013. A mass was noted at the ileocecal valve. And ileorectal anastomosis was created at 15 cm from the anal verge. 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 The resection margins were negative. No lymphovascular invasion. 22 lymph nodes were negative for metastatic carcinoma.  He reports an uneventful operative recovery. He has frequent bowel movements since surgery. He has had today for regular followup of Colon cancer surgery. He does not have to undergo chemotherapy. He has regular followup schedule with surgical team, he has been seen by oncologist. He is to continue his regular medication for ulcerative colitis. He does not smoke cigarette, former smoker, he does not drink alcohol. Patient has No headache, No chest pain, No abdominal pain - No Nausea, No new weakness tingling or numbness, No Cough - SOB.  ALLERGIES: Allergies  Allergen Reactions  . Ace Inhibitors   . Advil [Ibuprofen] Other (See Comments)    Muscle tightness  . Aspirin Nausea And Vomiting  . Tylenol [Acetaminophen] Other (See Comments)    Muscle tightness    PAST MEDICAL HISTORY: Past Medical History  Diagnosis Date  . Vitamin D deficiency   . UC (ulcerative colitis)   . Arthritis   . Osteoporosis   . GERD (gastroesophageal reflux disease)     MEDICATIONS AT HOME: Prior to Admission medications   Medication Sig Start Date End Date Taking? Authorizing Provider  chlorhexidine (PERIDEX) 0.12 % solution  08/21/13  Yes Historical Provider, MD    cholecalciferol (VITAMIN D) 1000 UNITS tablet Take 1,000 Units by mouth daily.   Yes Historical Provider, MD  famotidine (PEPCID) 20 MG tablet Take 1 tablet (20 mg total) by mouth 2 (two) times daily. 10/14/13  Yes Robert Chessman, MD  glucosamine-chondroitin 500-400 MG tablet Take 1 tablet by mouth 3 (three) times daily.   Yes Historical Provider, MD  Saw Palmetto 450 MG CAPS Take 2 capsules by mouth 2 (two) times daily.   Yes Historical Provider, MD  sulfaSALAzine (AZULFIDINE) 500 MG tablet Take 1 tablet (500 mg total) by mouth 4 (four) times daily. 10/14/13  Yes Robert Chessman, MD  vitamin A 10000 UNIT capsule Take 10,000 Units by mouth daily.   Yes Historical Provider, MD  vitamin E 100 UNIT capsule Take 100 Units by mouth daily.   Yes Historical Provider, MD  AMBULATORY NON FORMULARY MEDICATION Slippery elm 1 tablet by mouth once daily    Historical Provider, MD  AMBULATORY NON FORMULARY MEDICATION Peppermint Oil One capsule by mouth three times a day as needed    Historical Provider, MD  AMBULATORY NON FORMULARY MEDICATION Take 15 mLs by mouth daily. Raspberry fiber blend    Historical Provider, MD  AMBULATORY NON FORMULARY MEDICATION Artichoke  Take 1 capsule by mouth daily    Historical Provider, MD  metroNIDAZOLE (FLAGYL) 500 MG tablet  07/24/13   Historical Provider, MD  oxyCODONE (OXY IR/ROXICODONE) 5 MG immediate release tablet Take 1 tablet (5 mg total) by mouth every 4 (four) hours as needed for  moderate pain, severe pain or breakthrough pain. 7/58/83   Leighton Ruff, MD  traMADol (ULTRAM) 50 MG tablet Take 1 tablet (50 mg total) by mouth every 8 (eight) hours as needed. 10/14/13   Robert Chessman, MD     Objective:   Filed Vitals:   10/14/13 1445  BP: 139/73  Pulse: 63  Temp: 98.3 F (36.8 C)  TempSrc: Oral  Resp: 14  Height: 5' 9"  (1.753 m)  Weight: 215 lb (97.523 kg)  SpO2: 94%    Exam General appearance : Awake, alert, not in any distress. Speech Clear. Not  toxic looking HEENT: Atraumatic and Normocephalic, pupils equally reactive to light and accomodation Neck: supple, no JVD. No cervical lymphadenopathy.  Chest:Good air entry bilaterally, no added sounds  CVS: S1 S2 regular, no murmurs.  Abdomen: Bowel sounds present, Non tender and not distended with no gaurding, rigidity or rebound. Extremities: B/L Lower Ext shows no edema, both legs are warm to touch Neurology: Awake alert, and oriented X 3, CN II-XII intact, Non focal Skin:No Rash Wounds:N/A  Data Review No results found for this basename: HGBA1C     Assessment & Plan   1. Ulcerative colitis, unspecified  - famotidine (PEPCID) 20 MG tablet; Take 1 tablet (20 mg total) by mouth 2 (two) times daily.  Dispense: 60 tablet; Refill: 3 - sulfaSALAzine (AZULFIDINE) 500 MG tablet; Take 1 tablet (500 mg total) by mouth 4 (four) times daily.  Dispense: 120 tablet; Refill: 3  2. Knee pain, bilateral  - traMADol (ULTRAM) 50 MG tablet; Take 1 tablet (50 mg total) by mouth every 8 (eight) hours as needed.  Dispense: 60 tablet; Refill: 0  Patient was counseled extensively about nutrition and exercise  Return in about 3 months (around 01/14/2014), or if symptoms worsen or fail to improve, for Routine Follow Up, Abdominal Pain.  The patient was given clear instructions to go to ER or return to medical center if symptoms don't improve, worsen or new problems develop. The patient verbalized understanding. The patient was told to call to get lab results if they haven't heard anything in the next week.   This note has been created with Surveyor, quantity. Any transcriptional errors are unintentional.    Robert Chessman, MD, [place], [place], [place] [facility], [place]   12/30/2013, 11:44 PM

## 2014-01-14 ENCOUNTER — Encounter: Payer: Self-pay | Admitting: Internal Medicine

## 2014-01-14 ENCOUNTER — Ambulatory Visit: Payer: Medicare HMO | Attending: Internal Medicine | Admitting: Internal Medicine

## 2014-01-14 VITALS — BP 127/81 | HR 76 | Temp 97.6°F | Resp 16 | Ht 69.0 in | Wt 213.0 lb

## 2014-01-14 DIAGNOSIS — M25561 Pain in right knee: Secondary | ICD-10-CM

## 2014-01-14 DIAGNOSIS — K219 Gastro-esophageal reflux disease without esophagitis: Secondary | ICD-10-CM | POA: Insufficient documentation

## 2014-01-14 DIAGNOSIS — K5190 Ulcerative colitis, unspecified, without complications: Secondary | ICD-10-CM | POA: Insufficient documentation

## 2014-01-14 DIAGNOSIS — M25569 Pain in unspecified knee: Secondary | ICD-10-CM | POA: Insufficient documentation

## 2014-01-14 DIAGNOSIS — K429 Umbilical hernia without obstruction or gangrene: Secondary | ICD-10-CM | POA: Insufficient documentation

## 2014-01-14 DIAGNOSIS — M25562 Pain in left knee: Secondary | ICD-10-CM

## 2014-01-14 MED ORDER — ABDOMINAL BINDER/ELASTIC 3XL MISC: Status: DC

## 2014-01-14 MED ORDER — FAMOTIDINE 20 MG PO TABS: 20.0000 mg | ORAL_TABLET | Freq: Two times a day (BID) | ORAL | Status: DC

## 2014-01-14 MED ORDER — TRAMADOL HCL 50 MG PO TABS: 50.0000 mg | ORAL_TABLET | Freq: Three times a day (TID) | ORAL | Status: DC | PRN

## 2014-01-14 NOTE — Progress Notes (Signed)
Pt is here following up on his abdominal pain. Pt has colon cancer and still has a hernia. Pt needs refills for his medications.

## 2014-02-06 NOTE — Progress Notes (Signed)
Patient ID: Robert Rhodes, male   DOB: [birth year], 68 y.o.   MRN: [id number]   Robert Rhodes, is a 68 y.o. male  [id number]  [id number]  DOB - [birth year]  Chief Complaint  Patient presents with  . Follow-up        Subjective:   Robert Rhodes is a 68 y.o. male here today for a follow up visit. Patient underwent a laparoscopic total abdominal colectomy and hernia repair by Dr. Marcello Moores on 08/23/2013. A mass was noted at the ileocecal valve. And ileorectal anastomosis was created at 15 cm from the anal verge. 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 The resection margins were negative. No lymphovascular invasion. 22 lymph nodes were negative for metastatic carcinoma. Pt is here following up on his abdominal pain. Pt needs refills for his medications. He has no significant complaint today. Patient is requesting an abdominal band to support his umbilical hernia. Patient has No headache, No chest pain, No abdominal pain - No Nausea, No new weakness tingling or numbness, No Cough - SOB.  No problems updated.  ALLERGIES: Allergies  Allergen Reactions  . Ace Inhibitors   . Advil [Ibuprofen] Other (See Comments)    Muscle tightness  . Aspirin Nausea And Vomiting  . Tylenol [Acetaminophen] Other (See Comments)    Muscle tightness    PAST MEDICAL HISTORY: Past Medical History  Diagnosis Date  . Vitamin D deficiency   . UC (ulcerative colitis)   . Arthritis   . Osteoporosis   . GERD (gastroesophageal reflux disease)     MEDICATIONS AT HOME: Prior to Admission medications   Medication Sig Start Date End Date Taking? Authorizing Provider  cholecalciferol (VITAMIN D) 1000 UNITS tablet Take 1,000 Units by mouth daily.   Yes Historical Provider, MD  Saw Palmetto 450 MG CAPS Take 2 capsules by mouth 2 (two) times daily.   Yes Historical Provider, MD  sulfaSALAzine (AZULFIDINE) 500 MG tablet Take 1 tablet (500 mg total) by  mouth 4 (four) times daily. 10/14/13  Yes Tresa Garter, MD  vitamin A 10000 UNIT capsule Take 10,000 Units by mouth daily.   Yes Historical Provider, MD  vitamin E 100 UNIT capsule Take 100 Units by mouth daily.   Yes Historical Provider, MD  AMBULATORY NON FORMULARY MEDICATION Slippery elm 1 tablet by mouth once daily    Historical Provider, MD  AMBULATORY NON FORMULARY MEDICATION Peppermint Oil One capsule by mouth three times a day as needed    Historical Provider, MD  AMBULATORY NON FORMULARY MEDICATION Take 15 mLs by mouth daily. Raspberry fiber blend    Historical Provider, MD  AMBULATORY NON FORMULARY MEDICATION Artichoke  Take 1 capsule by mouth daily    Historical Provider, MD  chlorhexidine (PERIDEX) 0.12 % solution  08/21/13   Historical Provider, MD  Elastic Bandages & Supports (ABDOMINAL BINDER/ELASTIC 3XL) MISC Apply to abdominal hernia area as directed 01/14/14   Tresa Garter, MD  famotidine (PEPCID) 20 MG tablet Take 1 tablet (20 mg total) by mouth 2 (two) times daily. 01/14/14   Tresa Garter, MD  glucosamine-chondroitin 500-400 MG tablet Take 1 tablet by mouth 3 (three) times daily.    Historical Provider, MD  metroNIDAZOLE (FLAGYL) 500 MG tablet  07/24/13   Historical Provider, MD  oxyCODONE (OXY IR/ROXICODONE) 5 MG immediate release tablet Take 1 tablet (5 mg total) by mouth every 4 (four) hours as needed for moderate pain, severe pain or breakthrough pain. 09/03/13  Leighton Ruff, MD  traMADol (ULTRAM) 50 MG tablet Take 1 tablet (50 mg total) by mouth every 8 (eight) hours as needed. 01/14/14   Tresa Garter, MD     Objective:   Filed Vitals:   01/14/14 1036  BP: 127/81  Pulse: 76  Temp: 97.6 F (36.4 C)  TempSrc: Oral  Resp: 16  Height: 5' 9"  (1.753 m)  Weight: 213 lb (96.616 kg)  SpO2: 97%    Exam General appearance : Awake, alert, not in any distress. Speech Clear. Not toxic looking HEENT: Atraumatic and Normocephalic, pupils equally  reactive to light and accomodation Neck: supple, no JVD. No cervical lymphadenopathy.  Chest:Good air entry bilaterally, no added sounds  CVS: S1 S2 regular, no murmurs.  Abdomen: Large umbilicus hernia. Bowel sounds present, Non tender and not distended with no gaurding, rigidity or rebound. Extremities: B/L Lower Ext shows no edema, both legs are warm to touch Neurology: Awake alert, and oriented X 3, CN II-XII intact, Non focal Skin:No Rash Wounds:N/A  Data Review No results found for this basename: HGBA1C     Assessment & Plan   1. Ulcerative colitis, unspecified  - famotidine (PEPCID) 20 MG tablet; Take 1 tablet (20 mg total) by mouth 2 (two) times daily.  Dispense: 180 tablet; Refill: 3  2. Knee pain, bilateral  - traMADol (ULTRAM) 50 MG tablet; Take 1 tablet (50 mg total) by mouth every 8 (eight) hours as needed.  Dispense: 60 tablet; Refill: 0  3. Umbilical hernia, recurrence not specified  - Elastic Bandages & Supports (ABDOMINAL BINDER/ELASTIC 3XL) MISC; Apply to abdominal hernia area as directed  Dispense: 1 each; Refill: 1  Patient was counseled extensively about nutrition and exercise  Return in about 3 months (around 04/16/2014), or if symptoms worsen or fail to improve, for Routine Follow Up, Abdominal Pain, Follow up Pain and comorbidities.  The patient was given clear instructions to go to ER or return to medical center if symptoms don't improve, worsen or new problems develop. The patient verbalized understanding. The patient was told to call to get lab results if they haven't heard anything in the next week.   This note has been created with Surveyor, quantity. Any transcriptional errors are unintentional.    Angelica Chessman, MD, [place], [place], [place] and [facility], [place]   02/06/2014, 12:05 AM

## 2014-02-11 ENCOUNTER — Encounter (HOSPITAL_COMMUNITY): Payer: Self-pay | Admitting: Pharmacy Technician

## 2014-02-17 ENCOUNTER — Encounter (HOSPITAL_COMMUNITY): Payer: Self-pay | Admitting: Emergency Medicine

## 2014-02-17 ENCOUNTER — Emergency Department (HOSPITAL_COMMUNITY)
Admission: EM | Admit: 2014-02-17 | Discharge: 2014-02-17 | Disposition: A | Discharge status: Home / Self Care | Payer: Medicare HMO | Attending: Emergency Medicine | Admitting: Emergency Medicine

## 2014-02-17 DIAGNOSIS — M129 Arthropathy, unspecified: Secondary | ICD-10-CM | POA: Diagnosis not present

## 2014-02-17 DIAGNOSIS — K219 Gastro-esophageal reflux disease without esophagitis: Secondary | ICD-10-CM | POA: Diagnosis not present

## 2014-02-17 DIAGNOSIS — Z79899 Other long term (current) drug therapy: Secondary | ICD-10-CM | POA: Insufficient documentation

## 2014-02-17 DIAGNOSIS — H5789 Other specified disorders of eye and adnexa: Secondary | ICD-10-CM | POA: Diagnosis not present

## 2014-02-17 DIAGNOSIS — H53149 Visual discomfort, unspecified: Secondary | ICD-10-CM | POA: Insufficient documentation

## 2014-02-17 DIAGNOSIS — Z8639 Personal history of other endocrine, nutritional and metabolic disease: Secondary | ICD-10-CM | POA: Insufficient documentation

## 2014-02-17 DIAGNOSIS — H5710 Ocular pain, unspecified eye: Secondary | ICD-10-CM | POA: Diagnosis not present

## 2014-02-17 DIAGNOSIS — Z87891 Personal history of nicotine dependence: Secondary | ICD-10-CM | POA: Insufficient documentation

## 2014-02-17 DIAGNOSIS — Z8739 Personal history of other diseases of the musculoskeletal system and connective tissue: Secondary | ICD-10-CM | POA: Diagnosis not present

## 2014-02-17 DIAGNOSIS — H538 Other visual disturbances: Secondary | ICD-10-CM | POA: Insufficient documentation

## 2014-02-17 DIAGNOSIS — H5711 Ocular pain, right eye: Secondary | ICD-10-CM

## 2014-02-17 MED ORDER — TETRACAINE HCL 0.5 % OP SOLN
1.0000 [drp] | Freq: Once | OPHTHALMIC | Status: AC
  Administered 2014-02-17: 1 [drp] via OPHTHALMIC
  Filled 2014-02-17: qty 2

## 2014-02-17 MED ORDER — FLUORESCEIN SODIUM 1 MG OP STRP
1.0000 | ORAL_STRIP | Freq: Once | OPHTHALMIC | Status: AC
  Administered 2014-02-17: 1 via OPHTHALMIC
  Filled 2014-02-17: qty 1

## 2014-02-17 NOTE — ED Provider Notes (Signed)
68 year old male, complains of right eye pain that started earlier yesterday, last night he groundup aspirin and soaked it in water and rubbed the aspirin-containing paste into his right eye, when he woke up this morning he stated that he could not see out of the eye very well. 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 He did have improved pain after tetracaine. Likely aspirin injury to the eye, will need an ophthalmology consultation.  Medical screening examination/treatment/procedure(s) were conducted as a shared visit with non-physician practitioner(s) and myself.  I personally evaluated the patient during the encounter.  Clinical Impression:   Final diagnoses:  Eye pain, right         Johnna Acosta, MD 02/19/14 2315

## 2014-02-17 NOTE — ED Notes (Signed)
Per  Pt sts yesterday his right eye was hurting so he crushed up some ASA and put in water and rubbed on his eye. sts now hard to see out of the eye.

## 2014-02-17 NOTE — Discharge Instructions (Signed)
Blurred Vision You have been seen today complaining of blurred vision. This means you have a loss of ability to see small details.  CAUSES  Blurred vision can be a symptom of underlying eye problems, such as:  Aging of the eye (presbyopia).  Glaucoma.  Cataracts.  Eye infection.  Eye-related migraine.  Diabetes mellitus.  Fatigue.  Migraine headaches.  High blood pressure.  Breakdown of the back of the eye (macular degeneration).  Problems caused by some medications. The most common cause of blurred vision is the need for eyeglasses or a new prescription. Today in the emergency department, no cause for your blurred vision can be found. SYMPTOMS  Blurred vision is the loss of visual sharpness and detail (acuity). DIAGNOSIS  Should blurred vision continue, you should see your caregiver. If your caregiver is your primary care physician, he or she may choose to refer you to another specialist.  TREATMENT  Do not ignore your blurred vision. Make sure to have it checked out to see if further treatment or referral is necessary. SEEK MEDICAL CARE IF:  You are unable to get into a specialist so we can help you with a referral. SEEK IMMEDIATE MEDICAL CARE IF: You have severe eye pain, severe headache, or sudden loss of vision. MAKE SURE YOU:   Understand these instructions.  Will watch your condition.  Will get help right away if you are not doing well or get worse. Document Released: 06/02/2003 Document Revised: 08/22/2011 Document Reviewed: 01/02/2008 [facility] Patient Information 2015 [place], [place]. This information is not intended to replace advice given to you by your health care provider. Make sure you discuss any questions you have with your health care provider.

## 2014-02-17 NOTE — ED Provider Notes (Signed)
CSN: [id number]     Arrival date & time 02/17/14  1020 History  This chart was scribed for non-physician practitioner Montine Circle, PA-C working with Johnna Acosta, MD by Ludger Nutting, ED Scribe. This patient was seen in room TR04C/TR04C and the patient's care was started at 10:46 AM.    Chief Complaint  Patient presents with  . Eye Pain    The history is provided by the patient. No language interpreter was used.    HPI Comments: Robert Rhodes is a 68 y.o. male who presents to the Emergency Department complaining of 1 day of gradual onset, gradually worsening, constant right eye pain. Patient states last night he crushed an ASA, dissolved it in water, and applied it to his right eye. He reports decreased visual acuity in the same eye after he applied the ASA solution. Patient is able to see only light and dark, and object movement.   Past Medical History  Diagnosis Date  . Vitamin D deficiency   . UC (ulcerative colitis)   . Arthritis   . Osteoporosis   . GERD (gastroesophageal reflux disease)    Past Surgical History  Procedure Laterality Date  . Ventral hernia repair    . Eus N/A 07/18/2013    Procedure: UPPER ENDOSCOPIC ULTRASOUND (EUS) RADIAL;  Surgeon: Milus Banister, MD;  Location: [facility];  Service: Endoscopy;  Laterality: N/A;  . Intraocular lens insertion Bilateral     6 yrs ago  . Colon resection N/A 08/23/2013    Procedure: Laparoscopic total abdominal colectomy and hernia repair;  Surgeon: Leighton Ruff, MD;  Location: [facility];  Service: General;  Laterality: N/A;   Family History  Problem Relation Age of Onset  . Cancer Mother     type unknown  . Diabetes Father   . Heart disease Father   . Heart disease Sister   . Heart attack Brother    History  Substance Use Topics  . Smoking status: Former Smoker    Types: Cigarettes    Quit date: 06/14/2003  . Smokeless tobacco: Never Used     Comment: quit in July 2005  . Alcohol Use: No     Comment: quit in  2005    Review of Systems  Constitutional: Negative for fever and chills.  Eyes: Positive for photophobia, pain, redness and visual disturbance.  Respiratory: Negative for shortness of breath.   Cardiovascular: Negative for chest pain.  Gastrointestinal: Negative for nausea, vomiting, diarrhea and constipation.  Genitourinary: Negative for dysuria.      Allergies  Ace inhibitors; Advil; Aspirin; and Tylenol  Home Medications   Prior to Admission medications   Medication Sig Start Date End Date Taking? Authorizing Provider  AMBULATORY NON FORMULARY MEDICATION Take 1 tablet by mouth daily. Slippery elm    Historical Provider, MD  AMBULATORY NON FORMULARY MEDICATION Take 2 drops by mouth 2 (two) times daily. Peppermint Oil    Historical Provider, MD  AMBULATORY NON FORMULARY MEDICATION Take 15 mLs by mouth daily. Raspberry fiber blend    Historical Provider, MD  AMBULATORY NON FORMULARY MEDICATION Artichoke  Take 1 capsule by mouth daily    Historical Provider, MD  cholecalciferol (VITAMIN D) 1000 UNITS tablet Take 1,000 Units by mouth daily.    Historical Provider, MD  famotidine (PEPCID) 20 MG tablet Take 1 tablet (20 mg total) by mouth 2 (two) times daily. 01/14/14   Tresa Garter, MD  Saw Palmetto 450 MG CAPS Take 2 capsules by mouth 2 (two)  times daily.    Historical Provider, MD  vitamin A 10000 UNIT capsule Take 10,000 Units by mouth daily.    Historical Provider, MD  vitamin E 100 UNIT capsule Take 100 Units by mouth daily.    Historical Provider, MD   BP 150/62  Pulse 85  Temp(Src) 97.3 F (36.3 C)  Resp 18  SpO2 100% Physical Exam  Nursing note and vitals reviewed. Constitutional: He is oriented to person, place, and time. He appears well-developed and well-nourished.  HENT:  Head: Normocephalic and atraumatic.  Eyes: No foreign body present in the right eye.  Right cornea is somewhat hazy, conjunctiva is erythematous, no fluorescein uptake, no sign of corneal  abrasion. No obvious foreign body  Eye pressure right 23, left 15 Right eye pH 8.5    Cardiovascular: Normal rate.   Pulmonary/Chest: Effort normal.  Abdominal: He exhibits no distension.  Neurological: He is alert and oriented to person, place, and time.  Skin: Skin is warm and dry.  Psychiatric: He has a normal mood and affect.    ED Course  Procedures (including critical care time)  DIAGNOSTIC STUDIES: Oxygen Saturation is 100% on RA, normal by my interpretation.    COORDINATION OF CARE: 10:49 AM Discussed treatment plan with pt at bedside and pt agreed to plan.   Labs Review Labs Reviewed - No data to display  Imaging Review No results found.   EKG Interpretation None      MDM   Final diagnoses:  Eye pain, right   11:50 AM Patient seen by and discussed with Dr. Sabra Heck, who recommends consulting ophthalmology. Patient discussed with Dr. Ellie Lunch, who wants to have the patient sent to her office now.  Patient's nephew will drive the patient to Dr. Benna Dunks office now.  ~1:00pm, received phone call from Dr. Ellie Lunch, who states that the patient arrived and is being treated for severe iritis with possible glaucoma.  I personally performed the services described in this documentation, which was scribed in my presence. The recorded information has been reviewed and is accurate.    Montine Circle, PA-C 02/17/14 [phone]

## 2014-02-18 ENCOUNTER — Encounter (HOSPITAL_COMMUNITY): Payer: Self-pay

## 2014-02-18 ENCOUNTER — Ambulatory Visit (HOSPITAL_COMMUNITY)
Admission: RE | Admit: 2014-02-18 | Discharge: 2014-02-24 | Disposition: A | Discharge status: Home / Self Care | Payer: Medicare HMO | Source: Ambulatory Visit | Attending: General Surgery | Admitting: General Surgery

## 2014-02-18 ENCOUNTER — Encounter (HOSPITAL_COMMUNITY)
Admission: RE | Admit: 2014-02-18 | Discharge: 2014-02-18 | Disposition: A | Discharge status: Home / Self Care | Payer: Medicare HMO | Source: Ambulatory Visit | Attending: General Surgery | Admitting: General Surgery

## 2014-02-18 DIAGNOSIS — K209 Esophagitis, unspecified without bleeding: Secondary | ICD-10-CM | POA: Diagnosis not present

## 2014-02-18 DIAGNOSIS — Z87891 Personal history of nicotine dependence: Secondary | ICD-10-CM | POA: Insufficient documentation

## 2014-02-18 DIAGNOSIS — E559 Vitamin D deficiency, unspecified: Secondary | ICD-10-CM | POA: Diagnosis not present

## 2014-02-18 DIAGNOSIS — M129 Arthropathy, unspecified: Secondary | ICD-10-CM | POA: Diagnosis not present

## 2014-02-18 DIAGNOSIS — K432 Incisional hernia without obstruction or gangrene: Secondary | ICD-10-CM | POA: Diagnosis present

## 2014-02-18 DIAGNOSIS — K5180 Other ulcerative colitis without complications: Secondary | ICD-10-CM | POA: Diagnosis not present

## 2014-02-18 DIAGNOSIS — K660 Peritoneal adhesions (postprocedural) (postinfection): Secondary | ICD-10-CM | POA: Insufficient documentation

## 2014-02-18 DIAGNOSIS — M810 Age-related osteoporosis without current pathological fracture: Secondary | ICD-10-CM | POA: Diagnosis not present

## 2014-02-18 DIAGNOSIS — Z85038 Personal history of other malignant neoplasm of large intestine: Secondary | ICD-10-CM | POA: Diagnosis not present

## 2014-02-18 DIAGNOSIS — M25569 Pain in unspecified knee: Secondary | ICD-10-CM | POA: Diagnosis not present

## 2014-02-18 DIAGNOSIS — K219 Gastro-esophageal reflux disease without esophagitis: Secondary | ICD-10-CM | POA: Diagnosis not present

## 2014-02-18 HISTORY — DX: Congenital malformation of eye, unspecified: Q15.9

## 2014-02-18 HISTORY — DX: Claustrophobia: F40.240

## 2014-02-18 LAB — BASIC METABOLIC PANEL
ANION GAP: 9 (ref 5–15)
BUN: 8 mg/dL (ref 6–23)
CALCIUM: 9.5 mg/dL (ref 8.4–10.5)
CHLORIDE: 102 meq/L (ref 96–112)
CO2: 28 meq/L (ref 19–32)
CREATININE: 0.85 mg/dL (ref 0.50–1.35)
GFR calc Af Amer: >90 mL/min (ref >90)
GFR calc non Af Amer: 86 mL/min — ABNORMAL LOW (ref >90)
GLUCOSE: 80 mg/dL (ref 70–99)
Potassium: 4.5 mEq/L (ref 3.7–5.3)
Sodium: 139 mEq/L (ref 137–147)

## 2014-02-18 LAB — CBC
HCT: 41.5 % (ref 39.0–52.0)
HEMOGLOBIN: 13.6 g/dL (ref 13.0–17.0)
MCH: 28.4 pg (ref 26.0–34.0)
MCHC: 32.8 g/dL (ref 30.0–36.0)
MCV: 86.6 fL (ref 78.0–100.0)
Platelets: 432 10*3/uL — ABNORMAL HIGH (ref 150–400)
RBC: 4.79 MIL/uL (ref 4.22–5.81)
RDW: 14.1 % (ref 11.5–15.5)
WBC: 7.3 10*3/uL (ref 4.0–10.5)

## 2014-02-18 NOTE — Progress Notes (Signed)
CT chest 06/28/2013 per EPIC

## 2014-02-18 NOTE — Pre-Procedure Instructions (Signed)
02/18/2014 CT of chest per Epic 06/28/2013

## 2014-02-18 NOTE — Patient Instructions (Signed)
[place]  02/18/2014   Your procedure is scheduled on: 02/20/2014  Report to [facility] Main Entrance and follow signs to  [place] at  [place].  Call this number if you have problems the morning of surgery [phone]   Remember:  Do not eat food or drink liquids :After Midnight.     Take these medicines the morning of surgery with A SIP OF WATER: take pepcid (famatodine) and eye drops/bring eye drops with you.                               You may not have any metal on your body including hair pins and piercings  Do not wear jewelry, make-up, lotions, powders, or deodorant.   Men may shave face and neck.  Do not bring valuables to the hospital. Robert Rhodes.  Contacts, dentures or bridgework may not be worn into surgery.  Leave suitcase in the car. After surgery it may be brought to your room.  For patients admitted to the hospital, checkout time is 11:00 AM the day of discharge.   Patients discharged the day of surgery will not be allowed to drive home.  Name and phone number of your driver:Robert Rhodes / brother [phone].  Special Instructions: N/A ________________________________________________________________________  [facility] - Preparing for Surgery Before surgery, you can play an important role.  Because skin is not sterile, your skin needs to be as free of germs as possible.  You can reduce the number of germs on your skin by washing with CHG (chlorahexidine gluconate) soap before surgery.  CHG is an antiseptic cleaner which kills germs and bonds with the skin to continue killing germs even after washing. Please DO NOT use if you have an allergy to CHG or antibacterial soaps.  If your skin becomes reddened/irritated stop using the CHG and inform your nurse when you arrive at Short Stay. Do not shave (including legs and underarms) for at least 48 hours prior to the first CHG shower.  You may shave your  face/neck. Please follow these instructions carefully:  1.  Shower with CHG Soap the night before surgery and the  morning of Surgery.  2.  If you choose to wash your hair, wash your hair first as usual with your  normal  shampoo.  3.  After you shampoo, rinse your hair and body thoroughly to remove the  shampoo.                           4.  Use CHG as you would any other liquid soap.  You can apply chg directly  to the skin and wash                       Gently with a scrungie or clean washcloth.  5.  Apply the CHG Soap to your body ONLY FROM THE NECK DOWN.   Do not use on face/ open                           Wound or open sores. Avoid contact with eyes, ears mouth and genitals (private parts).                       Wash face,  Genitals (private parts) with your  normal soap.             6.  Wash thoroughly, paying special attention to the area where your surgery  will be performed.  7.  Thoroughly rinse your body with warm water from the neck down.  8.  DO NOT shower/wash with your normal soap after using and rinsing off  the CHG Soap.                9.  Pat yourself dry with a clean towel.            10.  Wear clean pajamas.            11.  Place clean sheets on your bed the night of your first shower and do not  sleep with pets. Day of Surgery : Do not apply any lotions/deodorants the morning of surgery.  Please wear clean clothes to the hospital/surgery center.  FAILURE TO FOLLOW THESE INSTRUCTIONS MAY RESULT IN THE CANCELLATION OF YOUR SURGERY PATIENT SIGNATURE_________________________________  NURSE SIGNATURE__________________________________  ________________________________________________________________________

## 2014-02-18 NOTE — Progress Notes (Signed)
CBC and BMP results sent to Dr Leighton Ruff 3/0/0511 / EPIC

## 2014-02-19 NOTE — ED Provider Notes (Signed)
Medical screening examination/treatment/procedure(s) were conducted as a shared visit with non-physician practitioner(s) and myself.  I personally evaluated the patient during the encounter  Please see my separate respective documentation pertaining to this patient encounter   Johnna Acosta, MD 02/19/14 2315

## 2014-02-19 NOTE — Anesthesia Preprocedure Evaluation (Addendum)
Anesthesia Evaluation  Patient identified by MRN, date of birth, ID band Patient awake    Reviewed: Allergy & Precautions, H&P , NPO status , Patient's Chart, lab work & pertinent test results  History of Anesthesia Complications Negative for: history of anesthetic complications  Airway Mallampati: II TM Distance: >3 FB Neck ROM: Full    Dental  (+) Edentulous Lower, Edentulous Upper   Pulmonary neg pulmonary ROS, former smoker,  breath sounds clear to auscultation  Pulmonary exam normal       Cardiovascular negative cardio ROS  Rhythm:Regular Rate:Normal     Neuro/Psych Anxiety negative neurological ROS  negative psych ROS   GI/Hepatic negative GI ROS, Neg liver ROS, PUD, GERD-  Medicated and Controlled,Ulcerative colitis   Endo/Other  negative endocrine ROS  Renal/GU negative Renal ROS  negative genitourinary   Musculoskeletal negative musculoskeletal ROS (+) Arthritis -, Osteoarthritis,    Abdominal   Peds negative pediatric ROS (+)  Hematology negative hematology ROS (+)   Anesthesia Other Findings 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, beard  Reproductive/Obstetrics negative OB ROS                          Anesthesia Physical Anesthesia Plan  ASA: II  Anesthesia Plan: General   Post-op Pain Management:    Induction: Intravenous  Airway Management Planned: Oral ETT  Additional Equipment:   Intra-op Plan:   Post-operative Plan: Extubation in OR  Informed Consent: I have reviewed the patients History and Physical, chart, labs and discussed the procedure including the risks, benefits and alternatives for the proposed anesthesia with the patient or authorized representative who has indicated his/her understanding and acceptance.   Dental advisory given  Plan Discussed with: CRNA  Anesthesia Plan Comments:         Anesthesia Quick Evaluation

## 2014-02-20 ENCOUNTER — Encounter (HOSPITAL_COMMUNITY): Payer: Medicare HMO | Admitting: Anesthesiology

## 2014-02-20 ENCOUNTER — Encounter (HOSPITAL_COMMUNITY)
Admission: RE | Disposition: A | Discharge status: Home / Self Care | Payer: Self-pay | Source: Ambulatory Visit | Attending: General Surgery

## 2014-02-20 ENCOUNTER — Encounter (HOSPITAL_COMMUNITY): Payer: Self-pay | Admitting: *Deleted

## 2014-02-20 ENCOUNTER — Ambulatory Visit (HOSPITAL_COMMUNITY): Payer: Medicare HMO | Admitting: Anesthesiology

## 2014-02-20 DIAGNOSIS — K432 Incisional hernia without obstruction or gangrene: Secondary | ICD-10-CM | POA: Diagnosis not present

## 2014-02-20 DIAGNOSIS — K660 Peritoneal adhesions (postprocedural) (postinfection): Secondary | ICD-10-CM | POA: Diagnosis not present

## 2014-02-20 DIAGNOSIS — E559 Vitamin D deficiency, unspecified: Secondary | ICD-10-CM | POA: Diagnosis not present

## 2014-02-20 DIAGNOSIS — K5180 Other ulcerative colitis without complications: Secondary | ICD-10-CM | POA: Diagnosis not present

## 2014-02-20 HISTORY — PX: INCISIONAL HERNIA REPAIR: SHX193

## 2014-02-20 SURGERY — REPAIR, HERNIA, INCISIONAL, LAPAROSCOPIC
Anesthesia: General | Site: Abdomen

## 2014-02-20 MED ORDER — GLYCOPYRROLATE 0.2 MG/ML IJ SOLN
INTRAMUSCULAR | Status: AC
  Filled 2014-02-20: qty 3

## 2014-02-20 MED ORDER — MEPERIDINE HCL 50 MG/ML IJ SOLN: 6.2500 mg | INTRAMUSCULAR | Status: DC | PRN

## 2014-02-20 MED ORDER — PROMETHAZINE HCL 25 MG/ML IJ SOLN
INTRAMUSCULAR | Status: AC
  Administered 2014-02-20: 13:00:00
  Filled 2014-02-20: qty 1

## 2014-02-20 MED ORDER — MIDAZOLAM HCL 5 MG/5ML IJ SOLN
INTRAMUSCULAR | Status: DC | PRN
  Administered 2014-02-20: 2 mg via INTRAVENOUS

## 2014-02-20 MED ORDER — BUPIVACAINE-EPINEPHRINE (PF) 0.5% -1:200000 IJ SOLN
INTRAMUSCULAR | Status: DC | PRN
  Administered 2014-02-20: 17 mL

## 2014-02-20 MED ORDER — CEFAZOLIN SODIUM-DEXTROSE 2-3 GM-% IV SOLR
INTRAVENOUS | Status: AC
  Filled 2014-02-20: qty 50

## 2014-02-20 MED ORDER — LACTATED RINGERS IV SOLN
INTRAVENOUS | Status: DC | PRN
Start: 2014-02-20 — End: 2014-02-20
  Administered 2014-02-20: 1000 mL

## 2014-02-20 MED ORDER — PHENYLEPHRINE HCL 10 MG/ML IJ SOLN
INTRAMUSCULAR | Status: DC | PRN
  Administered 2014-02-20: 80 ug via INTRAVENOUS

## 2014-02-20 MED ORDER — ONDANSETRON HCL 4 MG PO TABS: 4.0000 mg | ORAL_TABLET | Freq: Four times a day (QID) | ORAL | Status: DC | PRN

## 2014-02-20 MED ORDER — DEXAMETHASONE SODIUM PHOSPHATE 10 MG/ML IJ SOLN
INTRAMUSCULAR | Status: AC
  Filled 2014-02-20: qty 1

## 2014-02-20 MED ORDER — NEOSTIGMINE METHYLSULFATE 10 MG/10ML IV SOLN
INTRAVENOUS | Status: AC
  Filled 2014-02-20: qty 1

## 2014-02-20 MED ORDER — KCL IN DEXTROSE-NACL 20-5-0.45 MEQ/L-%-% IV SOLN
INTRAVENOUS | Status: AC
  Administered 2014-02-20: 14:00:00
  Filled 2014-02-20: qty 1000

## 2014-02-20 MED ORDER — OXYCODONE HCL 5 MG PO TABS
5.0000 mg | ORAL_TABLET | ORAL | Status: DC | PRN
  Administered 2014-02-20 – 2014-02-21 (×3): 10 mg via ORAL
  Administered 2014-02-21: 5 mg via ORAL
  Administered 2014-02-22 – 2014-02-24 (×7): 10 mg via ORAL
  Filled 2014-02-20 (×7): qty 2
  Filled 2014-02-20: qty 1
  Filled 2014-02-20 (×3): qty 2

## 2014-02-20 MED ORDER — DIFLUPREDNATE 0.05 % OP EMUL
1.0000 [drp] | Freq: Every day | OPHTHALMIC | Status: DC | PRN
  Administered 2014-02-20 – 2014-02-24 (×29): 1 [drp] via OPHTHALMIC

## 2014-02-20 MED ORDER — CHLORHEXIDINE GLUCONATE 4 % EX LIQD: 1.0000 "application " | Freq: Once | CUTANEOUS | Status: DC

## 2014-02-20 MED ORDER — FENTANYL CITRATE 0.05 MG/ML IJ SOLN
INTRAMUSCULAR | Status: AC
  Filled 2014-02-20: qty 2

## 2014-02-20 MED ORDER — LIDOCAINE HCL (CARDIAC) 20 MG/ML IV SOLN
INTRAVENOUS | Status: DC | PRN
  Administered 2014-02-20: 100 mg via INTRAVENOUS

## 2014-02-20 MED ORDER — PROPOFOL 10 MG/ML IV BOLUS
INTRAVENOUS | Status: DC | PRN
  Administered 2014-02-20: 200 mg via INTRAVENOUS

## 2014-02-20 MED ORDER — FAMOTIDINE 20 MG PO TABS
20.0000 mg | ORAL_TABLET | Freq: Two times a day (BID) | ORAL | Status: DC
  Administered 2014-02-20 – 2014-02-24 (×9): 20 mg via ORAL
  Filled 2014-02-20 (×10): qty 1

## 2014-02-20 MED ORDER — ROCURONIUM BROMIDE 100 MG/10ML IV SOLN
INTRAVENOUS | Status: AC
  Filled 2014-02-20: qty 1

## 2014-02-20 MED ORDER — LACTATED RINGERS IV SOLN
INTRAVENOUS | Status: DC
  Administered 2014-02-20: 1000 mL via INTRAVENOUS
  Administered 2014-02-20 (×2): via INTRAVENOUS

## 2014-02-20 MED ORDER — LIDOCAINE HCL (CARDIAC) 20 MG/ML IV SOLN
INTRAVENOUS | Status: AC
  Filled 2014-02-20: qty 5

## 2014-02-20 MED ORDER — MIDAZOLAM HCL 2 MG/2ML IJ SOLN
INTRAMUSCULAR | Status: AC
  Filled 2014-02-20: qty 2

## 2014-02-20 MED ORDER — ONDANSETRON HCL 4 MG/2ML IJ SOLN
INTRAMUSCULAR | Status: AC
  Filled 2014-02-20: qty 2

## 2014-02-20 MED ORDER — ONDANSETRON HCL 4 MG/2ML IJ SOLN: 4.0000 mg | Freq: Four times a day (QID) | INTRAMUSCULAR | Status: DC | PRN

## 2014-02-20 MED ORDER — CEFAZOLIN SODIUM 1-5 GM-% IV SOLN
1.0000 g | Freq: Four times a day (QID) | INTRAVENOUS | Status: AC
  Administered 2014-02-20 – 2014-02-21 (×3): 1 g via INTRAVENOUS
  Filled 2014-02-20 (×3): qty 50

## 2014-02-20 MED ORDER — ESMOLOL HCL 10 MG/ML IV SOLN
INTRAVENOUS | Status: DC | PRN
  Administered 2014-02-20: 20 mg via INTRAVENOUS

## 2014-02-20 MED ORDER — ESMOLOL HCL 10 MG/ML IV SOLN
INTRAVENOUS | Status: AC
Start: 2014-02-20 — End: 2014-02-20
  Filled 2014-02-20: qty 10

## 2014-02-20 MED ORDER — MORPHINE SULFATE 10 MG/ML IJ SOLN
2.0000 mg | INTRAMUSCULAR | Status: DC | PRN
  Administered 2014-02-20 – 2014-02-21 (×3): 4 mg via INTRAVENOUS
  Administered 2014-02-21 (×3): 2 mg via INTRAVENOUS
  Filled 2014-02-20 (×6): qty 1

## 2014-02-20 MED ORDER — CEFAZOLIN SODIUM-DEXTROSE 2-3 GM-% IV SOLR
2.0000 g | INTRAVENOUS | Status: AC
  Administered 2014-02-20: 2 g via INTRAVENOUS

## 2014-02-20 MED ORDER — FENTANYL CITRATE 0.05 MG/ML IJ SOLN
25.0000 ug | INTRAMUSCULAR | Status: DC | PRN
  Administered 2014-02-20 (×3): 50 ug via INTRAVENOUS

## 2014-02-20 MED ORDER — GLYCOPYRROLATE 0.2 MG/ML IJ SOLN
INTRAMUSCULAR | Status: DC | PRN
  Administered 2014-02-20: 0.6 mg via INTRAVENOUS

## 2014-02-20 MED ORDER — SUCCINYLCHOLINE CHLORIDE 20 MG/ML IJ SOLN
INTRAMUSCULAR | Status: DC | PRN
  Administered 2014-02-20: 100 mg via INTRAVENOUS

## 2014-02-20 MED ORDER — ATROPINE SULFATE 1 % OP SOLN
1.0000 [drp] | Freq: Two times a day (BID) | OPHTHALMIC | Status: DC
  Administered 2014-02-20 – 2014-02-24 (×8): 1 [drp] via OPHTHALMIC
  Filled 2014-02-20: qty 2

## 2014-02-20 MED ORDER — KCL IN DEXTROSE-NACL 20-5-0.45 MEQ/L-%-% IV SOLN
INTRAVENOUS | Status: DC
  Administered 2014-02-20 – 2014-02-21 (×2): via INTRAVENOUS
  Administered 2014-02-21: 75 mL via INTRAVENOUS
  Filled 2014-02-20 (×3): qty 1000

## 2014-02-20 MED ORDER — PROPOFOL 10 MG/ML IV BOLUS
INTRAVENOUS | Status: AC
  Filled 2014-02-20: qty 20

## 2014-02-20 MED ORDER — PROMETHAZINE HCL 25 MG/ML IJ SOLN
6.2500 mg | INTRAMUSCULAR | Status: AC | PRN
  Administered 2014-02-20 (×2): 6.25 mg via INTRAVENOUS

## 2014-02-20 MED ORDER — MORPHINE SULFATE 10 MG/ML IJ SOLN: 2.0000 mg | INTRAMUSCULAR | Status: DC | PRN

## 2014-02-20 MED ORDER — FENTANYL CITRATE 0.05 MG/ML IJ SOLN
INTRAMUSCULAR | Status: AC
  Administered 2014-02-20: 13:00:00
  Filled 2014-02-20: qty 2

## 2014-02-20 MED ORDER — BUPIVACAINE-EPINEPHRINE (PF) 0.5% -1:200000 IJ SOLN
INTRAMUSCULAR | Status: AC
  Filled 2014-02-20: qty 30

## 2014-02-20 MED ORDER — 0.9 % SODIUM CHLORIDE (POUR BTL) OPTIME
TOPICAL | Status: DC | PRN
  Administered 2014-02-20: 1000 mL

## 2014-02-20 MED ORDER — NEOSTIGMINE METHYLSULFATE 10 MG/10ML IV SOLN
INTRAVENOUS | Status: DC | PRN
  Administered 2014-02-20: 5 mg via INTRAVENOUS

## 2014-02-20 MED ORDER — FENTANYL CITRATE 0.05 MG/ML IJ SOLN
INTRAMUSCULAR | Status: AC
  Filled 2014-02-20: qty 5

## 2014-02-20 MED ORDER — FENTANYL CITRATE 0.05 MG/ML IJ SOLN
INTRAMUSCULAR | Status: AC
  Administered 2014-02-20: 12:00:00
  Filled 2014-02-20: qty 2

## 2014-02-20 MED ORDER — FENTANYL CITRATE 0.05 MG/ML IJ SOLN
INTRAMUSCULAR | Status: DC | PRN
  Administered 2014-02-20 (×2): 50 ug via INTRAVENOUS
  Administered 2014-02-20 (×3): 100 ug via INTRAVENOUS
  Administered 2014-02-20: 50 ug via INTRAVENOUS

## 2014-02-20 MED ORDER — BUPIVACAINE HCL (PF) 0.5 % IJ SOLN
INTRAMUSCULAR | Status: AC
  Filled 2014-02-20: qty 30

## 2014-02-20 MED ORDER — ONDANSETRON HCL 4 MG/2ML IJ SOLN
INTRAMUSCULAR | Status: DC | PRN
  Administered 2014-02-20: 4 mg via INTRAVENOUS

## 2014-02-20 MED ORDER — ROCURONIUM BROMIDE 100 MG/10ML IV SOLN
INTRAVENOUS | Status: DC | PRN
  Administered 2014-02-20: 10 mg via INTRAVENOUS
  Administered 2014-02-20: 5 mg via INTRAVENOUS
  Administered 2014-02-20: 40 mg via INTRAVENOUS
  Administered 2014-02-20: 10 mg via INTRAVENOUS

## 2014-02-20 MED ORDER — SULFASALAZINE 500 MG PO TABS
500.0000 mg | ORAL_TABLET | Freq: Four times a day (QID) | ORAL | Status: DC
  Administered 2014-02-20 – 2014-02-24 (×16): 500 mg via ORAL
  Filled 2014-02-20 (×19): qty 1

## 2014-02-20 MED ORDER — ENOXAPARIN SODIUM 40 MG/0.4ML ~~LOC~~ SOLN
40.0000 mg | SUBCUTANEOUS | Status: DC
  Administered 2014-02-21 – 2014-02-24 (×4): 40 mg via SUBCUTANEOUS
  Filled 2014-02-20 (×6): qty 0.4

## 2014-02-20 MED ORDER — DEXAMETHASONE SODIUM PHOSPHATE 10 MG/ML IJ SOLN
INTRAMUSCULAR | Status: DC | PRN
  Administered 2014-02-20: 10 mg via INTRAVENOUS

## 2014-02-20 SURGICAL SUPPLY — 67 items
BANDAGE ADH SHEER 1  50/CT (GAUZE/BANDAGES/DRESSINGS) IMPLANT
BENZOIN TINCTURE PRP APPL 2/3 (GAUZE/BANDAGES/DRESSINGS) IMPLANT
BINDER ABDOMINAL 12 ML 46-62 (SOFTGOODS) ×3 IMPLANT
CANISTER SUCTION 2500CC (MISCELLANEOUS) IMPLANT
CHLORAPREP W/TINT 26ML (MISCELLANEOUS) ×3 IMPLANT
CLOSURE WOUND 1/2 X4 (GAUZE/BANDAGES/DRESSINGS) ×1
DECANTER SPIKE VIAL GLASS SM (MISCELLANEOUS) IMPLANT
DEVICE SECURE STRAP 25 ABSORB (INSTRUMENTS) ×6 IMPLANT
DEVICE TROCAR PUNCTURE CLOSURE (ENDOMECHANICALS) ×3 IMPLANT
DISSECTOR BLUNT TIP ENDO 5MM (MISCELLANEOUS) IMPLANT
DRAIN CHANNEL RND F F (WOUND CARE) ×3 IMPLANT
DRAPE LAPAROSCOPIC ABDOMINAL (DRAPES) ×3 IMPLANT
DRAPE UTILITY XL STRL (DRAPES) IMPLANT
DRSG TEGADERM 2-3/8X2-3/4 SM (GAUZE/BANDAGES/DRESSINGS) IMPLANT
DRSG TELFA PLUS 4X6 ADH ISLAND (GAUZE/BANDAGES/DRESSINGS) ×3 IMPLANT
ELECT CAUTERY BLADE 6.4 (BLADE) ×3 IMPLANT
ELECT REM PT RETURN 9FT ADLT (ELECTROSURGICAL) ×3
ELECTRODE REM PT RTRN 9FT ADLT (ELECTROSURGICAL) ×1 IMPLANT
EVACUATOR DRAINAGE 7X20 100CC (MISCELLANEOUS) ×1 IMPLANT
EVACUATOR SILICONE 100CC (MISCELLANEOUS) ×2
GLOVE BIOGEL M STRL SZ7.5 (GLOVE) IMPLANT
GLOVE BIOGEL PI IND STRL 7.0 (GLOVE) ×2 IMPLANT
GLOVE BIOGEL PI INDICATOR 7.0 (GLOVE) ×4
GLOVE ECLIPSE 8.0 STRL XLNG CF (GLOVE) ×3 IMPLANT
GLOVE INDICATOR 8.0 STRL GRN (GLOVE) ×6 IMPLANT
GLOVE SS BIOGEL STRL SZ 7 (GLOVE) ×1 IMPLANT
GLOVE SUPERSENSE BIOGEL SZ 7 (GLOVE) ×2
GLOVE SURG SIGNA 7.5 PF LTX (GLOVE) IMPLANT
GOWN SPEC L4 XLG W/TWL (GOWN DISPOSABLE) ×3 IMPLANT
GOWN STRL REIN 3XL LVL4 (GOWN DISPOSABLE) ×3 IMPLANT
GOWN STRL REUS W/TWL LRG LVL3 (GOWN DISPOSABLE) ×6 IMPLANT
KIT BASIN OR (CUSTOM PROCEDURE TRAY) ×3 IMPLANT
MARKER SKIN DUAL TIP RULER LAB (MISCELLANEOUS) ×3 IMPLANT
MESH VENTRALIGHT ST 6X8 (Mesh Specialty) ×2 IMPLANT
MESH VENTRLGHT ELLIPSE 8X6XMFL (Mesh Specialty) ×1 IMPLANT
NEEDLE INSUFFLATION 14GA 120MM (NEEDLE) IMPLANT
NEEDLE SPNL 22GX3.5 QUINCKE BK (NEEDLE) ×3 IMPLANT
NS IRRIG 1000ML POUR BTL (IV SOLUTION) ×3 IMPLANT
PAIN PUMP ON-Q 400MLX5ML 5IN (MISCELLANEOUS) IMPLANT
PENCIL BUTTON HOLSTER BLD 10FT (ELECTRODE) ×3 IMPLANT
SCISSORS LAP 5X35 DISP (ENDOMECHANICALS) ×3 IMPLANT
SET IRRIG TUBING LAPAROSCOPIC (IRRIGATION / IRRIGATOR) ×3 IMPLANT
SHEARS HARMONIC ACE PLUS 36CM (ENDOMECHANICALS) IMPLANT
SLEEVE XCEL OPT CAN 5 100 (ENDOMECHANICALS) ×9 IMPLANT
SOLUTION ANTI FOG 6CC (MISCELLANEOUS) ×3 IMPLANT
SPONGE DRAIN TRACH 4X4 STRL 2S (GAUZE/BANDAGES/DRESSINGS) ×3 IMPLANT
SPONGE LAP 18X18 X RAY DECT (DISPOSABLE) ×3 IMPLANT
STAPLER VISISTAT 35W (STAPLE) ×3 IMPLANT
STRIP CLOSURE SKIN 1/2X4 (GAUZE/BANDAGES/DRESSINGS) ×2 IMPLANT
SUT ETHILON 2 0 PS N (SUTURE) IMPLANT
SUT MNCRL AB 4-0 PS2 18 (SUTURE) ×3 IMPLANT
SUT NOVA 0 T19/GS 22DT (SUTURE) IMPLANT
SUT NOVA NAB DX-16 0-1 5-0 T12 (SUTURE) ×12 IMPLANT
SUT VIC AB 2-0 SH 27 (SUTURE) ×2
SUT VIC AB 2-0 SH 27X BRD (SUTURE) ×1 IMPLANT
TACKER 5MM HERNIA 3.5CML NAB (ENDOMECHANICALS) IMPLANT
TOWEL OR 17X26 10 PK STRL BLUE (TOWEL DISPOSABLE) ×3 IMPLANT
TRAY FOLEY CATH 14FRSI W/METER (CATHETERS) IMPLANT
TRAY FOLEY CATH 16FRSI W/METER (SET/KITS/TRAYS/PACK) ×3 IMPLANT
TRAY LAP CHOLE (CUSTOM PROCEDURE TRAY) ×3 IMPLANT
TROCAR BLADELESS OPT 5 75 (ENDOMECHANICALS) ×3 IMPLANT
TROCAR XCEL BLUNT TIP 100MML (ENDOMECHANICALS) IMPLANT
TROCAR XCEL NON-BLD 11X100MML (ENDOMECHANICALS) ×3 IMPLANT
TROCAR XCEL UNIV SLVE 11M 100M (ENDOMECHANICALS) IMPLANT
TUBING INSUFFLATION 10FT LAP (TUBING) ×3 IMPLANT
YANKAUER SUCT BULB TIP 10FT TU (MISCELLANEOUS) ×3 IMPLANT
YANKAUER SUCT BULB TIP NO VENT (SUCTIONS) ×3 IMPLANT

## 2014-02-20 NOTE — Anesthesia Postprocedure Evaluation (Signed)
  Anesthesia Post-op Note  Patient: Robert Rhodes  Procedure(s) Performed: Procedure(s) (LRB): LAP ASSISTED INCISIONAL HERNIA REPAIR LYSIS OF ADHESIONS (N/A) HERNIA REPAIR INCISIONAL (N/A)  Patient Location: PACU  Anesthesia Type: MAC  Level of Consciousness: awake and alert   Airway and Oxygen Therapy: Patient Spontanous Breathing  Post-op Pain: mild  Post-op Assessment: Post-op Vital signs reviewed, Patient's Cardiovascular Status Stable, Respiratory Function Stable, Patent Airway and No signs of Nausea or vomiting  Last Vitals:  Filed Vitals:   02/20/14 1220  BP:   Pulse: 113  Temp:   Resp: 20    Post-op Vital Signs: stable   Complications: No apparent anesthesia complications

## 2014-02-20 NOTE — Op Note (Signed)
02/20/2014  11:28 AM  PATIENT:  Robert Rhodes  68 y.o. male  Patient Care Team: Tresa Garter, MD as PCP - General (Internal Medicine) Ladell Pier, MD as Consulting Physician (Oncology) Carola Frost, RN as Registered Nurse  PRE-OPERATIVE DIAGNOSIS:  Incisional Hernia  POST-OPERATIVE DIAGNOSIS:  Incisional Hernia  PROCEDURE:  Procedure(s): LAP ASSISTED INCISIONAL HERNIA REPAIR,  LYSIS OF ADHESIONS x1 HOUR HERNIA REPAIR INCISIONAL  SURGEON:  Surgeon(s): Leighton Ruff, MD Jackolyn Confer, MD  ASSISTANT: Zella Richer   ANESTHESIA:   general  EBL: 16m Total I/O In: 2000 [I.V.:2000] Out: 100 [Urine:100]  Delay start of Pharmacological VTE agent (>24hrs) due to surgical blood loss or risk of bleeding:  no  DRAINS: (1F) Jackson-Pratt drain(s) with closed bulb suction in the hernia sac   SPECIMEN:  No Specimen  DISPOSITION OF SPECIMEN:  N/A  COUNTS:  YES  PLAN OF CARE: Admit for overnight observation  PATIENT DISPOSITION:  PACU - hemodynamically stable.  INDICATION: Pleasant patient has developed a ventral wall abdominal hernia.   Recommendation was made for surgical repair:  The anatomy & physiology of the abdominal wall was discussed. The pathophysiology of hernias was discussed. Natural history risks without surgery including progeressive enlargement, pain, incarceration & strangulation was discussed. Contributors to complications such as smoking, obesity, diabetes, prior surgery, etc were discussed.  I feel the risks of no intervention will lead to serious problems that outweigh the operative risks; therefore, I recommended surgery to reduce and repair the hernia. I explained laparoscopic techniques with possible need for an open approach. I noted the probable use of mesh to patch and/or buttress the hernia repair  Risks such as bleeding, infection, abscess, need for further treatment, heart attack, death, and other risks were discussed. I noted a good  likelihood this will help address the problem. Goals of post-operative recovery were discussed as well. Possibility that this will not correct all symptoms was explained. I stressed the importance of low-impact activity, aggressive pain control, avoiding constipation, & not pushing through pain to minimize risk of post-operative chronic pain or injury. Possibility of reherniation especially with smoking, obesity, diabetes, immunosuppression, and other health conditions was discussed. We will work to minimize complications.  An educational handout further explaining the pathology & treatment options was given as well. Questions were answered. The patient expresses understanding & wishes to proceed with surgery.   OR FINDINGS: supraumbilical hernia 11 x 9cm   Type of repair - Laparoscopic underlay repair   Name of mesh -  Bard Ventralight dual sided (polypropylene / Seprafilm)  Size of mesh - Length 15 cm, Width 20 cm  Mesh overlap - 4 cm  Placement of mesh - Intraperitoneal underlay repair   DESCRIPTION:   Informed consent was confirmed. The patient underwent general anaesthesia without difficulty. The patient was positioned appropriately. VTE prevention in place. The patient's abdomen was clipped, prepped, & draped in a sterile fashion. Surgical timeout confirmed our plan.  The patient was positioned in reverse Trendelenburg. Abdominal entry was gained using optical entry technique in the left upper abdomen. Entry was clean. I induced carbon dioxide insufflation. Camera inspection revealed no injury. Extra ports were carefully placed under direct laparoscopic visualization.   I could see the hernia in the upper abdomen.    I did laparoscopic lysis of adhesions to expose the entire anterior abdominal wall.  I primarily used and focused cold scissors.  I could not visualize the upper hernia sac adhesions to safely transect these, so I made  a small midline incision to lyse the remaining adhesions  under direct visualization.  After this was completed, I measured the defect directly.  It was 9 x 11 cm.    I made sure hemostasis was good.   I chose a 15 x 20 cm piece of mesh.  I placed #1 Prolene stitches around its edge about every 5 cm = 8 total.  I rolled the mesh & placed into the peritoneal cavity through the open defect.  I unrolled  the mesh and positioned it appropriately and oriented it horizontally.  I then closed the fascial defect using interrupted #1 Novofil sutures.  The hernia sac was brought together with a running 2-0 Vicryl suture.  The skin was closed with staples.    I secured the mesh to cover up the hernia defect using a laparoscopic suture passer to pass the tails of the Prolene through the abdominal wall & tagged them with clamps.  I started out in four corners to make sure I had the mesh centered over the hernia defect appropriately, and then proceeded to work in quadrants.  We evacuated CO2 & desufflated the abdomen.  I tied the fascial stitches down.  I reinsufflated the abdomen.  The mesh provided good coverage around the entire region of hernia defects.   I tacked the edges & central part of the mesh to the peritoneum/posterior rectus fascia with ~35 SecureStrap absorbable tacks.   Hemostasis was excellent.  4 quadrant inspection revealed no abnormalities.  Mesh laid well. pneumoperitoneum was evacuated. Ports were removed. The skin was closed with staples at the port sites and Steri-Strips on the fascial stitch puncture sites.  Patient is being extubated to go to the recovery room. I'm about discussed operative findings with the patient's family.

## 2014-02-20 NOTE — Transfer of Care (Signed)
Immediate Anesthesia Transfer of Care Note  Patient: Robert Rhodes  Procedure(s) Performed: Procedure(s) with comments: LAP ASSISTED INCISIONAL HERNIA REPAIR LYSIS OF ADHESIONS (N/A) - converted to open @ [place] (N/A)  Patient Location: PACU  Anesthesia Type:General  Level of Consciousness: sedated  Airway & Oxygen Therapy: Patient Spontanous Breathing and Patient connected to face mask oxygen  Post-op Assessment: Report given to PACU RN and Post -op Vital signs reviewed and stable  Post vital signs: Reviewed and stable  Complications: No apparent anesthesia complications

## 2014-02-20 NOTE — H&P (Signed)
Robert Rhodes is a 68 y.o. male who is status post a TAC with [facility] on 3/13 for UC and a cecal cancer. He is doing well. He is eating well. He has a good appetite. He is not losing weight. His ~3 bowel movements a day. He is urinating several times a day and denies any symptoms of dehydration. He is wearing his abdominal binder for his hernia. He feels that this is working well for him. He was seen by Dr. Benay Spice for evaluation of his Stage I colon cancer, no indication for adjuvant therapy. He is ready to get his hernia fixed.  Past Medical History   Diagnosis  Date   .  Vitamin D deficiency    .  UC (ulcerative colitis)    .  Arthritis    .  Osteoporosis    .  GERD (gastroesophageal reflux disease)     Past Surgical History   Procedure  Laterality  Date   .  Ventral hernia repair     .  Eus  N/A  07/18/2013     Procedure: UPPER ENDOSCOPIC ULTRASOUND (EUS) RADIAL; Surgeon: Milus Banister, MD; Location: [facility]; Service: Endoscopy; Laterality: N/A;   .  Intraocular lens insertion  Bilateral      6 yrs ago   .  Colon resection  N/A  08/23/2013     Procedure: Laparoscopic total abdominal colectomy and hernia repair; Surgeon: Leighton Ruff, MD; Location: [facility]; Service: General; Laterality: N/A;    Allergies   Allergen  Reactions   .  Ace Inhibitors    .  Advil [Ibuprofen]  Other (See Comments)     Muscle tightness   .  Aspirin  Nausea And Vomiting   .  Tylenol [Acetaminophen]  Other (See Comments)     Muscle tightness    Family History   Problem  Relation  Age of Onset   .  Cancer  Mother      type unknown   .  Diabetes  Father    .  Heart disease  Father    .  Heart disease  Sister    .  Heart attack  Brother     History    Social History   .  Marital Status:  Single     Spouse Name:  N/A     Number of Children:  0   .  Years of Education:  N/A    Occupational History   .  retired     Social History Main Topics   .  Smoking status:  Former Smoker     Types:   Cigarettes     Quit date:  06/14/2003   .  Smokeless tobacco:  Never Used      Comment: quit in July 2005   .  Alcohol Use:  No      Comment: quit in 2005   .  Drug Use:  No   .  Sexual Activity:  Yes    Other Topics  Concern   .  Not on file    Social History Narrative   .  No narrative on file   Objective:  BP 142/79  Pulse 79  Temp(Src) 98.1 F (36.7 C) (Oral)  Resp 18  Ht 5' 9"  (1.753 m)  Wt 205 lb 9 oz (93.243 kg)  BMI 30.34 kg/m2  SpO2 99% General appearance: alert and cooperative  CV: RRR  Lungs: CTA  GI: normal findings: soft, non-tender  Hernia: nontender  except right lateral sidewall  Incision: well healed  Assessment:  s/p  Patient Active Problem List    Diagnosis  Date Noted   .  Nonspecific (abnormal) findings on radiological and other examination of gastrointestinal tract  07/18/2013   .  Acute esophagitis  07/18/2013   .  Colon cancer  07/16/2013   .  Hypovitaminosis D  05/06/2013   .  Ulcerative colitis, unspecified  04/04/2013   .  Ventral hernia  03/05/2013   .  Knee pain, bilateral  03/05/2013   Stage I colon cancer with no signs of metastatic disease. He did have some enlarged paraesophageal nodes preoperatively. An EGD was performed which showed no periesophageal masses but did show inflammation. He was placed on appropriate therapy. We will need to repeat his CT sometime in the future to confirm that these have resolved with treatment. I think it would be very unlikely that this would be metastatic disease given his early staging.  Plan:  We will schedule a laparoscopic hernia repair. Risks and benefits were discussed with the patient. The surgery and anatomy were described to the patient as well as the risks of surgery and the possible complications. These include: Bleeding, deep abdominal infections and possible wound complications such as recurrent hernia and infection, damage to adjacent structures, possible need for other procedures, such as  abscess drains in radiology, possible prolonged hospital stay, possible constipation from narcotics, prolonged fatigue/weakness or appetite loss, possible complications of their medical problems such as heart disease or arrhythmias or lung problems, death (less than 1%). I believe the patient understands and wishes to proceed with the surgery.

## 2014-02-21 ENCOUNTER — Encounter (HOSPITAL_COMMUNITY): Payer: Self-pay | Admitting: General Surgery

## 2014-02-21 DIAGNOSIS — K432 Incisional hernia without obstruction or gangrene: Secondary | ICD-10-CM | POA: Diagnosis not present

## 2014-02-21 LAB — CBC
HCT: 40.2 % (ref 39.0–52.0)
HEMOGLOBIN: 13.1 g/dL (ref 13.0–17.0)
MCH: 27.9 pg (ref 26.0–34.0)
MCHC: 32.6 g/dL (ref 30.0–36.0)
MCV: 85.5 fL (ref 78.0–100.0)
Platelets: 434 10*3/uL — ABNORMAL HIGH (ref 150–400)
RBC: 4.7 MIL/uL (ref 4.22–5.81)
RDW: 14 % (ref 11.5–15.5)
WBC: 13.2 10*3/uL — AB (ref 4.0–10.5)

## 2014-02-21 LAB — BASIC METABOLIC PANEL
Anion gap: 10 (ref 5–15)
BUN: 9 mg/dL (ref 6–23)
CHLORIDE: 101 meq/L (ref 96–112)
CO2: 30 meq/L (ref 19–32)
Calcium: 9.3 mg/dL (ref 8.4–10.5)
Creatinine, Ser: 0.94 mg/dL (ref 0.50–1.35)
GFR calc Af Amer: >90 mL/min (ref >90)
GFR calc non Af Amer: 83 mL/min — ABNORMAL LOW (ref >90)
Glucose, Bld: 110 mg/dL — ABNORMAL HIGH (ref 70–99)
POTASSIUM: 4.1 meq/L (ref 3.7–5.3)
Sodium: 141 mEq/L (ref 137–147)

## 2014-02-21 MED ORDER — MORPHINE SULFATE 2 MG/ML IJ SOLN
2.0000 mg | INTRAMUSCULAR | Status: DC | PRN
  Administered 2014-02-21 (×2): 4 mg via INTRAVENOUS
  Administered 2014-02-22 (×5): 2 mg via INTRAVENOUS
  Filled 2014-02-21 (×4): qty 1
  Filled 2014-02-21: qty 2
  Filled 2014-02-21: qty 1
  Filled 2014-02-21: qty 2

## 2014-02-21 NOTE — Progress Notes (Signed)
1 Day Post-Op Lap assisted VHR Subjective: Feels "tight".  Pain controlled.  No nausea.  Ambulating well  Objective: Vital signs in last 24 hours: Temp:  [97.4 F (36.3 C)-98.2 F (36.8 C)] 98 F (36.7 C) (09/11 0615) Pulse Rate:  [80-127] 85 (09/11 0615) Resp:  [10-23] 18 (09/11 0615) BP: (122-177)/(65-102) 122/65 mmHg (09/11 0615) SpO2:  [91 %-100 %] 94 % (09/11 0615)   Intake/Output from previous day: 09/10 0701 - 09/11 0700 In: 3000 [I.V.:3000] Out: 1615 [Urine:1525; Drains:90] Intake/Output this shift:     General appearance: alert and cooperative GI: normal findings: soft, non-tender  Incision: no significant drainage  Lab Results:   Recent Labs  02/18/14 1015 02/21/14 0447  WBC 7.3 13.2*  HGB 13.6 13.1  HCT 41.5 40.2  PLT 432* 434*   BMET  Recent Labs  02/18/14 1015 02/21/14 0447  NA 139 141  K 4.5 4.1  CL 102 101  CO2 28 30  GLUCOSE 80 110*  BUN 8 9  CREATININE 0.85 0.94  CALCIUM 9.5 9.3   PT/INR No results found for this basename: LABPROT, INR,  in the last 72 hours ABG No results found for this basename: PHART, PCO2, PO2, HCO3,  in the last 72 hours  MEDS, Scheduled . atropine  1 drop Right Eye BID  . enoxaparin (LOVENOX) injection  40 mg Subcutaneous Q24H  . famotidine  20 mg Oral BID  . sulfaSALAzine  500 mg Oral QID    Studies/Results: No results found.  Assessment: s/p Procedure(s): LAP ASSISTED INCISIONAL HERNIA REPAIR LYSIS OF ADHESIONS HERNIA REPAIR INCISIONAL Patient Active Problem List   Diagnosis Date Noted  . 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 Nonspecific (abnormal) findings on radiological and other examination of gastrointestinal tract 07/18/2013  . Acute esophagitis 07/18/2013  . Colon cancer 07/16/2013  . Hypovitaminosis D 05/06/2013  . Ulcerative colitis, unspecified 04/04/2013  . Ventral hernia 03/05/2013  . Knee pain, bilateral 03/05/2013    Expected post op course  Plan: d/c foley Cont clear liquids  until tolerating better Good UOP.  Minimize IVF's Cont home meds DVT prophylaxis, Inc spirometry    LOS: 1 day     .Rosario Adie, [facility], [place]   02/21/2014 8:26 AM

## 2014-02-22 DIAGNOSIS — K432 Incisional hernia without obstruction or gangrene: Secondary | ICD-10-CM | POA: Diagnosis not present

## 2014-02-22 MED ORDER — CYCLOBENZAPRINE HCL 5 MG PO TABS
5.0000 mg | ORAL_TABLET | ORAL | Status: DC | PRN
  Administered 2014-02-22: 5 mg via ORAL
  Filled 2014-02-22: qty 1

## 2014-02-22 NOTE — Progress Notes (Signed)
2 Days Post-Op Lap assisted VHR Subjective: Feels better.  Pain controlled.  No nausea.  Ambulating well  Objective: Vital signs in last 24 hours: Temp:  [98 F (36.7 C)-98.4 F (36.9 C)] 98 F (36.7 C) (09/12 0528) Pulse Rate:  [75-101] 101 (09/12 0528) Resp:  [16-18] 18 (09/12 0528) BP: (135-140)/(52-86) 140/86 mmHg (09/12 0528) SpO2:  [96 %-99 %] 99 % (09/12 0528)   Intake/Output from previous day: 09/11 0701 - 09/12 0700 In: 361 [I.V.:361] Out: 40 [Drains:40] Intake/Output this shift:     General appearance: alert and cooperative GI: normal findings: soft, non-tender  Incision: no significant drainage  Lab Results:   Recent Labs  02/21/14 0447  WBC 13.2*  HGB 13.1  HCT 40.2  PLT 434*   BMET  Recent Labs  02/21/14 0447  NA 141  K 4.1  CL 101  CO2 30  GLUCOSE 110*  BUN 9  CREATININE 0.94  CALCIUM 9.3   PT/INR No results found for this basename: LABPROT, INR,  in the last 72 hours ABG No results found for this basename: PHART, PCO2, PO2, HCO3,  in the last 72 hours  MEDS, Scheduled . atropine  1 drop Right Eye BID  . enoxaparin (LOVENOX) injection  40 mg Subcutaneous Q24H  . famotidine  20 mg Oral BID  . sulfaSALAzine  500 mg Oral QID    Studies/Results: No results found.  Assessment: s/p Procedure(s): LAP ASSISTED INCISIONAL HERNIA REPAIR LYSIS OF ADHESIONS HERNIA REPAIR INCISIONAL Patient Active Problem List   Diagnosis Date Noted  . 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 Nonspecific (abnormal) findings on radiological and other examination of gastrointestinal tract 07/18/2013  . Acute esophagitis 07/18/2013  . Colon cancer 07/16/2013  . Hypovitaminosis D 05/06/2013  . Ulcerative colitis, unspecified 04/04/2013  . Ventral hernia 03/05/2013  . Knee pain, bilateral 03/05/2013    Expected post op course  Plan: Cont clear liquids per pt preference Cont home meds DVT prophylaxis, Inc spirometry, ambulating   LOS: 2 days     .Rosario Adie, [facility], [place] [phone]   02/22/2014 8:48 AM

## 2014-02-23 DIAGNOSIS — K432 Incisional hernia without obstruction or gangrene: Secondary | ICD-10-CM | POA: Diagnosis not present

## 2014-02-23 NOTE — Plan of Care (Signed)
Problem: Phase II Progression Outcomes Goal: Dressings dry/intact Outcome: Adequate for Discharge Changed to JP

## 2014-02-23 NOTE — Plan of Care (Signed)
Problem: Phase II Progression Outcomes Goal: Progress activity as tolerated unless otherwise ordered Outcome: Progressing Ambulating in the hall

## 2014-02-23 NOTE — Plan of Care (Signed)
Problem: Phase II Progression Outcomes Goal: Pain controlled Outcome: Progressing Oral meds

## 2014-02-24 DIAGNOSIS — K432 Incisional hernia without obstruction or gangrene: Secondary | ICD-10-CM | POA: Diagnosis not present

## 2014-02-24 MED ORDER — OXYCODONE HCL 5 MG PO TABS: 5.0000 mg | ORAL_TABLET | ORAL | Status: DC | PRN

## 2014-02-24 MED ORDER — CYCLOBENZAPRINE HCL 5 MG PO TABS: 5.0000 mg | ORAL_TABLET | ORAL | Status: DC | PRN

## 2014-02-24 NOTE — Discharge Instructions (Signed)
HERNIA REPAIR: POST OP INSTRUCTIONS  1. DIET: Follow a light bland diet the first 24 hours after arrival home, such as soup, liquids, crackers, etc.  Be sure to include lots of fluids daily.  Avoid fast food or heavy meals as your are more likely to get nauseated.  Eat a low fat the next few days after surgery. 2. Take your usually prescribed home medications unless otherwise directed. 3. PAIN CONTROL: a. Pain is best controlled by a usual combination of three different methods TOGETHER: i. Ice/Heat ii. Over the counter pain medication iii. Prescription pain medication b. Most patients will experience some swelling and bruising around the hernia(s) such as the bellybutton, groins, or old incisions.  Ice packs or heating pads (30-60 minutes up to 6 times a day) will help. Use ice for the first few days to help decrease swelling and bruising, then switch to heat to help relax tight/sore spots and speed recovery.  Some people prefer to use ice alone, heat alone, alternating between ice & heat.  Experiment to what works for you.  Swelling and bruising can take several weeks to resolve.   c. It is helpful to take an over-the-counter pain medication regularly for the first few weeks.  Choose one of the following that works best for you: i. Naproxen (Aleve, etc)  Two 276m tabs twice a day ii. Ibuprofen (Advil, etc) Three 2092mtabs four times a day (every meal & bedtime) d. A  prescription for pain medication should be given to you upon discharge.  Take your pain medication as prescribed.  i. If you are having problems/concerns with the prescription medicine (does not control pain, nausea, vomiting, rash, itching, etc), please call [phone] see if we need to switch you to a different pain medicine that will work better for you and/or control your side effect better. ii. If you need a refill on your pain medication, please contact your pharmacy.  They will contact our office to request  authorization. Prescriptions will not be filled after 5 pm or on week-ends. 4. Avoid getting constipated.  Between the surgery and the pain medications, it is common to experience some constipation.  Increasing fluid intake and taking a fiber supplement (such as Metamucil, Citrucel, FiberCon, MiraLax, etc) 1-2 times a day regularly will usually help prevent this problem from occurring.  A mild laxative (prune juice, Milk of Magnesia, MiraLax, etc) should be taken according to package directions if there are no bowel movements after 48 hours.   5. Wash / shower every day.  You may shower over the dressings as they are waterproof.   6. Remove your waterproof bandages 5 days after surgery.  You may leave the incision open to air.  You may replace a dressing/Band-Aid to cover the incision for comfort if you wish.  Continue to shower over incision(s) after the dressing is off.    7. ACTIVITIES as tolerated:   a. You may resume regular (light) daily activities beginning the next day--such as daily self-care, walking, climbing stairs--gradually increasing activities as tolerated.  If you can walk 30 minutes without difficulty, it is safe to try more intense activity such as jogging, treadmill, bicycling, low-impact aerobics, swimming, etc. b. Save the most intensive and strenuous activity for last such as sit-ups, heavy lifting, contact sports, etc  Refrain from any heavy lifting or straining until you are off narcotics for pain control.   c. DO NOT PUSH THROUGH PAIN.  Let pain be your guide: If  it hurts to do something, don't do it.  Pain is your body warning you to avoid that activity for another week until the pain goes down. d. You may drive when you are no longer taking prescription pain medication, you can comfortably wear a seatbelt, and you can safely maneuver your car and apply brakes. e. Dennis Bast may have sexual intercourse when it is comfortable.  8. FOLLOW UP in our office a. Please call [facility] at [phone]  [phone] to set up an appointment to see your surgeon in the office for a follow-up appointment approximately 2-3 weeks after your surgery. b. Make sure that you call for this appointment the day you arrive home to insure a convenient appointment time. 9.  IF YOU HAVE DISABILITY OR FAMILY LEAVE FORMS, BRING THEM TO THE OFFICE FOR PROCESSING.  DO NOT GIVE THEM TO YOUR DOCTOR.  WHEN TO CALL us [phone]: 1. Poor pain control 2. Reactions / problems with new medications (rash/itching, nausea, etc)  3. Fever over 101.5 F (38.5 C) 4. Inability to urinate 5. Nausea and/or vomiting 6. Worsening swelling or bruising 7. Continued bleeding from incision. 8. Increased pain, redness, or drainage from the incision   The clinic staff is available to answer your questions during regular business hours (8:30am-5pm).  Please dont hesitate to call and ask to speak to one of our nurses for clinical concerns.   If you have a medical emergency, go to the nearest emergency room or call 911.  A surgeon from [facility] is always on call at the hospitals in [facility], [place], [place], [place], [place]  [address] ?  [address] [address], [place], [place]   [address] MAIN: [phone] ? TOLL FREE: [phone] ? FAX: [phone] [email or web address]

## 2014-02-24 NOTE — Discharge Summary (Signed)
Physician Discharge Summary  Patient ID: Robert Rhodes MRN: [id number] DOB/AGE: Aug 26, 1945 68 y.o.  Admit date: 02/20/2014 Discharge date: 02/24/2014  Admission Diagnoses: ventral hernia  Discharge Diagnoses:  Ventral hernia  Discharged Condition: good  Hospital Course: Patient admitted after lap assisted ventral hernia repair.  He was monitored in the hospital for bowel function and pain control.  By POD 4 he was in stable condition for discharge to home.    Consults: None  Significant Diagnostic Studies: labs: cbc, chemistry  Treatments: IV hydration, analgesia: acetaminophen w/ codeine and surgery: see above  Discharge Exam: Blood pressure 142/68, pulse 91, temperature 98.2 F (36.8 C), temperature source Oral, resp. rate 18, height 5' 9"  (1.753 m), weight 205 lb 9 oz (93.243 kg), SpO2 100.00%. General appearance: alert and cooperative GI: normal findings: soft, non-tender Incision/Wound: clean, dry  Disposition: 01-Home or Self Care     Medication List         AMBULATORY NON FORMULARY MEDICATION  Take 1 tablet by mouth daily. Slippery elm     AMBULATORY NON FORMULARY MEDICATION  Take 2 drops by mouth 2 (two) times daily. Peppermint Oil     AMBULATORY NON FORMULARY MEDICATION  Take 15 mLs by mouth daily. Raspberry fiber blend     AMBULATORY NON FORMULARY MEDICATION  - Artichoke   - Take 1 capsule by mouth daily     atropine 1 % ophthalmic solution  Place 1 drop into the right eye 2 (two) times daily.     cholecalciferol 1000 UNITS tablet  Commonly known as:  VITAMIN D  Take 1,000 Units by mouth daily.     cyclobenzaprine 5 MG tablet  Commonly known as:  FLEXERIL  Take 1 tablet (5 mg total) by mouth every 4 (four) hours as needed for muscle spasms.     Difluprednate 0.05 % Emul  Apply 1 drop to eye daily. Every hour while awake     famotidine 20 MG tablet  Commonly known as:  PEPCID  Take 1 tablet (20 mg total) by mouth 2 (two) times daily.     oxyCODONE 5 MG immediate release tablet  Commonly known as:  Oxy IR/ROXICODONE  Take 1-2 tablets (5-10 mg total) by mouth every 4 (four) hours as needed for moderate pain, severe pain or breakthrough pain.     Saw Palmetto 450 MG Caps  Take 2 capsules by mouth 2 (two) times daily.     sulfaSALAzine 500 MG tablet  Commonly known as:  AZULFIDINE  Take 500 mg by mouth 4 (four) times daily.     vitamin A 10000 UNIT capsule  Take 10,000 Units by mouth daily.     vitamin E 100 UNIT capsule  Take 100 Units by mouth daily.           Follow-up Information   Follow up with Rosario Adie., MD. Schedule an appointment as soon as possible for a visit in 2 weeks.   Specialty:  General Surgery   Contact information:   [address] [place] [address] [phone]       Signed: Rosario Adie 6/72/0947, 0:96 AM

## 2014-02-25 ENCOUNTER — Other Ambulatory Visit: Payer: Self-pay | Admitting: Internal Medicine

## 2014-02-25 ENCOUNTER — Ambulatory Visit: Payer: Medicare HMO | Attending: Internal Medicine

## 2014-02-25 DIAGNOSIS — H209 Unspecified iridocyclitis: Secondary | ICD-10-CM

## 2014-02-25 DIAGNOSIS — K5190 Ulcerative colitis, unspecified, without complications: Secondary | ICD-10-CM

## 2014-02-25 DIAGNOSIS — Z0000 Encounter for general adult medical examination without abnormal findings: Secondary | ICD-10-CM

## 2014-02-26 LAB — RPR

## 2014-02-26 LAB — HLA-B27 ANTIGEN: DNA Result:: DETECTED — AB

## 2014-02-26 LAB — FLUORESCENT TREPONEMAL AB(FTA)-IGG-BLD: FLUORESCENT TREPONEMAL ABS: NONREACTIVE

## 2014-02-27 ENCOUNTER — Other Ambulatory Visit: Payer: Self-pay | Admitting: Internal Medicine

## 2014-02-27 ENCOUNTER — Ambulatory Visit: Payer: Medicare HMO | Attending: Internal Medicine

## 2014-02-27 ENCOUNTER — Ambulatory Visit (HOSPITAL_COMMUNITY)
Admission: RE | Admit: 2014-02-27 | Discharge: 2014-02-27 | Disposition: A | Discharge status: Home / Self Care | Payer: Medicare HMO | Source: Ambulatory Visit | Attending: Internal Medicine | Admitting: Internal Medicine

## 2014-02-27 DIAGNOSIS — R7611 Nonspecific reaction to tuberculin skin test without active tuberculosis: Secondary | ICD-10-CM | POA: Diagnosis present

## 2014-02-27 NOTE — Progress Notes (Unsigned)
Patient presents for PPD reading of right forearm Induration 20 mm X 20 mm Patient states that he had greater amount of swelling after PPD around 20 years ago. Provider made aware and CXR to evaluate for TB ordered. Patient will have CXR done directly. Patient aware that test results will be discussed by provider who will be prescribing biologic medication.

## 2014-03-04 ENCOUNTER — Telehealth: Payer: Self-pay | Admitting: Emergency Medicine

## 2014-03-04 NOTE — Telephone Encounter (Signed)
Pt given normal xray results

## 2014-03-04 NOTE — Telephone Encounter (Signed)
Message copied by Ricci Barker on Tue Mar 04, 2014 11:27 AM ------      Message from: Angelica Chessman E      Created: Mon Mar 03, 2014  5:40 PM       Please inform patient that his chest x-ray shows no abnormality. There is no evidence of tuberculosis. ------

## 2014-04-17 ENCOUNTER — Ambulatory Visit: Payer: Commercial Managed Care - HMO | Attending: Internal Medicine | Admitting: Internal Medicine

## 2014-04-17 ENCOUNTER — Encounter: Payer: Self-pay | Admitting: Internal Medicine

## 2014-04-17 VITALS — BP 146/84 | HR 88 | Temp 97.6°F | Resp 16 | Ht 69.0 in | Wt 189.0 lb

## 2014-04-17 DIAGNOSIS — L988 Other specified disorders of the skin and subcutaneous tissue: Secondary | ICD-10-CM | POA: Diagnosis not present

## 2014-04-17 DIAGNOSIS — K5190 Ulcerative colitis, unspecified, without complications: Secondary | ICD-10-CM | POA: Insufficient documentation

## 2014-04-17 DIAGNOSIS — L821 Other seborrheic keratosis: Secondary | ICD-10-CM | POA: Diagnosis not present

## 2014-04-17 DIAGNOSIS — K51911 Ulcerative colitis, unspecified with rectal bleeding: Secondary | ICD-10-CM | POA: Diagnosis present

## 2014-04-17 MED ORDER — SULFASALAZINE 500 MG PO TABS: 500.0000 mg | ORAL_TABLET | Freq: Four times a day (QID) | ORAL | Status: DC

## 2014-04-17 NOTE — Progress Notes (Signed)
Patient ID: Robert Rhodes, male   DOB: 12/09/1945, 68 y.o.   MRN: [id number]   Robert Rhodes, is a 68 y.o. male  HMC:[id number]  MOQ:[id number]  DOB - 06/27/1945  Chief Complaint  Patient presents with  . Follow-up        Subjective:   Robert Rhodes is a 68 y.o. male here today for a follow up visit. Pt is here today requesting referrals to dermatology and a Gastroenterologist. Pt states that he is having abdomen pain of a week. He said that the pain is coming from the colitis. Patient has No headache, No chest pain, No abdominal pain - No Nausea, No new weakness tingling or numbness, No Cough - SOB.  Problem  Ulcerative Colitis  Verrucous Keratosis    ALLERGIES: Allergies  Allergen Reactions  . Ace Inhibitors Other (See Comments)    Cant remember  . Advil [Ibuprofen] Other (See Comments)    Muscle tightness  . Aspirin Nausea And Vomiting  . Tylenol [Acetaminophen] Other (See Comments)    Muscle tightness    PAST MEDICAL HISTORY: Past Medical History  Diagnosis Date  . Vitamin D deficiency   . UC (ulcerative colitis)   . Arthritis   . Osteoporosis   . GERD (gastroesophageal reflux disease)   . Cancer     colon cancer  . Eye abnormalities     right eye seen in ED 02/17/2014 wearing eye patch using eye drops  . Claustrophobia    MEDICATIONS AT HOME: Prior to Admission medications   Medication Sig Start Date End Date Taking? Authorizing Provider  AMBULATORY NON FORMULARY MEDICATION Take 1 tablet by mouth daily. Slippery elm    Historical Provider, MD  AMBULATORY NON FORMULARY MEDICATION Take 2 drops by mouth 2 (two) times daily. Peppermint Oil    Historical Provider, MD  AMBULATORY NON FORMULARY MEDICATION Take 15 mLs by mouth daily. Raspberry fiber blend    Historical Provider, MD  AMBULATORY NON FORMULARY MEDICATION Artichoke  Take 1 capsule by mouth daily    Historical Provider, MD  atropine 1 % ophthalmic solution Place 1 drop into the right eye 2 (two) times  daily.    Historical Provider, MD  cholecalciferol (VITAMIN D) 1000 UNITS tablet Take 1,000 Units by mouth daily.    Historical Provider, MD  cyclobenzaprine (FLEXERIL) 5 MG tablet Take 1 tablet (5 mg total) by mouth every 4 (four) hours as needed for muscle spasms. 3/54/65   Robert Ruff, MD  Difluprednate 0.05 % EMUL Apply 1 drop to eye daily. Every hour while awake    Historical Provider, MD  famotidine (PEPCID) 20 MG tablet Take 1 tablet (20 mg total) by mouth 2 (two) times daily. 01/14/14   Tresa Garter, MD  oxyCODONE (OXY IR/ROXICODONE) 5 MG immediate release tablet Take 1-2 tablets (5-10 mg total) by mouth every 4 (four) hours as needed for moderate pain, severe pain or breakthrough pain. 6/81/27   Robert Ruff, MD  Saw Palmetto 450 MG CAPS Take 2 capsules by mouth 2 (two) times daily.    Historical Provider, MD  sulfaSALAzine (AZULFIDINE) 500 MG tablet Take 1 tablet (500 mg total) by mouth 4 (four) times daily. 04/17/14   Tresa Garter, MD  vitamin A 10000 UNIT capsule Take 10,000 Units by mouth daily.    Historical Provider, MD  vitamin E 100 UNIT capsule Take 100 Units by mouth daily.    Historical Provider, MD     Objective:   Filed Vitals:   04/17/14  1222  BP: 146/84  Pulse: 88  Temp: 97.6 F (36.4 C)  TempSrc: Oral  Resp: 16  Height: 5' 9"  (1.753 m)  Weight: 189 lb (85.73 kg)  SpO2: 99%    Exam General appearance : Awake, alert, not in any distress. Speech Clear. Not toxic looking HEENT: Atraumatic and Normocephalic, pupils equally reactive to light and accomodation Neck: supple, no JVD. No cervical lymphadenopathy.  Chest:Good air entry bilaterally, no added sounds  CVS: S1 S2 regular, no murmurs.  Abdomen: Bowel sounds present, Non tender and not distended with no gaurding, rigidity or rebound. Extremities: B/L Lower Ext shows no edema, both legs are warm to touch Neurology: Awake alert, and oriented X 3, CN II-XII intact, Non focal  Data Review No  results found for: HGBA1C   Assessment & Plan   1. Ulcerative colitis, with rectal bleeding  - Ambulatory referral to Gastroenterology  - sulfaSALAzine (AZULFIDINE) 500 MG tablet; Take 1 tablet (500 mg total) by mouth 4 (four) times daily.  Dispense: 120 tablet; Refill: 3  2. Verrucous keratosis  - Ambulatory referral to Dermatology  Return in about 3 months (around 07/18/2014) for Ulcerative Colitis, , Abdominal Pain.  The patient was given clear instructions to go to ER or return to medical center if symptoms don't improve, worsen or new problems develop. The patient verbalized understanding. The patient was told to call to get lab results if they haven't heard anything in the next week.   This note has been created with Surveyor, quantity. Any transcriptional errors are unintentional.    Angelica Chessman, MD, [place], [place], [place] and [place] [employer], [place]   04/17/2014, 12:57 PM

## 2014-04-17 NOTE — Patient Instructions (Signed)
Colitis Colitis is inflammation of the colon. Colitis can be a short-term or long-standing (chronic) illness. Crohn's disease and ulcerative colitis are 2 types of colitis which are chronic. They usually require lifelong treatment. CAUSES  There are many different causes of colitis, including:  Viruses.  Germs (bacteria).  Medicine reactions. SYMPTOMS   Diarrhea.  Intestinal bleeding.  Pain.  Fever.  Throwing up (vomiting).  Tiredness (fatigue).  Weight loss.  Bowel blockage. DIAGNOSIS  The diagnosis of colitis is based on examination and stool or blood tests. X-rays, CT scan, and colonoscopy may also be needed. TREATMENT  Treatment may include:  Fluids given through the vein (intravenously).  Bowel rest (nothing to eat or drink for a period of time).  Medicine for pain and diarrhea.  Medicines (antibiotics) that kill germs.  Cortisone medicines.  Surgery. HOME CARE INSTRUCTIONS   Get plenty of rest.  Drink enough water and fluids to keep your urine clear or pale yellow.  Eat a well-balanced diet.  Call your caregiver for follow-up as recommended. SEEK IMMEDIATE MEDICAL CARE IF:   You develop chills.  You have an oral temperature above 102 F (38.9 C), not controlled by medicine.  You have extreme weakness, fainting, or dehydration.  You have repeated vomiting.  You develop severe belly (abdominal) pain or are passing bloody or tarry stools. MAKE SURE YOU:   Understand these instructions.  Will watch your condition.  Will get help right away if you are not doing well or get worse. Document Released: 07/07/2004 Document Revised: 08/22/2011 Document Reviewed: 10/02/2009 [facility] Patient Information 2015 [place], [place]. This information is not intended to replace advice given to you by your health care provider. Make sure you discuss any questions you have with your health care provider.

## 2014-04-17 NOTE — Progress Notes (Signed)
Pt is here today requesting referrals to dermatology and a GI doctor. Pt states that he is having abdomen pain of a week. He said that the pain is coming from the colitis.

## 2014-04-24 ENCOUNTER — Ambulatory Visit: Payer: Medicare HMO | Admitting: Nurse Practitioner

## 2014-05-06 ENCOUNTER — Ambulatory Visit (INDEPENDENT_AMBULATORY_CARE_PROVIDER_SITE_OTHER): Payer: Commercial Managed Care - HMO | Admitting: Gastroenterology

## 2014-05-06 ENCOUNTER — Encounter: Payer: Self-pay | Admitting: Gastroenterology

## 2014-05-06 ENCOUNTER — Other Ambulatory Visit (INDEPENDENT_AMBULATORY_CARE_PROVIDER_SITE_OTHER): Payer: Commercial Managed Care - HMO

## 2014-05-06 ENCOUNTER — Other Ambulatory Visit: Payer: Self-pay | Admitting: *Deleted

## 2014-05-06 ENCOUNTER — Telehealth: Payer: Self-pay | Admitting: *Deleted

## 2014-05-06 VITALS — BP 110/66 | HR 84 | Ht 66.14 in | Wt 193.0 lb

## 2014-05-06 DIAGNOSIS — K51911 Ulcerative colitis, unspecified with rectal bleeding: Secondary | ICD-10-CM

## 2014-05-06 DIAGNOSIS — C189 Malignant neoplasm of colon, unspecified: Secondary | ICD-10-CM

## 2014-05-06 DIAGNOSIS — R197 Diarrhea, unspecified: Secondary | ICD-10-CM | POA: Insufficient documentation

## 2014-05-06 LAB — COMPREHENSIVE METABOLIC PANEL
ALT: 24 U/L (ref 0–53)
AST: 21 U/L (ref 0–37)
Albumin: 3.3 g/dL — ABNORMAL LOW (ref 3.5–5.2)
Alkaline Phosphatase: 81 U/L (ref 39–117)
BUN: 8 mg/dL (ref 6–23)
CHLORIDE: 101 meq/L (ref 96–112)
CO2: 28 meq/L (ref 19–32)
CREATININE: 1 mg/dL (ref 0.4–1.5)
Calcium: 9.1 mg/dL (ref 8.4–10.5)
GFR: 95.97 mL/min (ref >60.00)
Glucose, Bld: 83 mg/dL (ref 70–99)
Potassium: 4.1 mEq/L (ref 3.5–5.1)
SODIUM: 137 meq/L (ref 135–145)
TOTAL PROTEIN: 7.9 g/dL (ref 6.0–8.3)
Total Bilirubin: 0.5 mg/dL (ref 0.2–1.2)

## 2014-05-06 LAB — CBC WITH DIFFERENTIAL/PLATELET
BASOS PCT: 0.4 % (ref 0.0–3.0)
Basophils Absolute: 0 10*3/uL (ref 0.0–0.1)
EOS PCT: 1.3 % (ref 0.0–5.0)
Eosinophils Absolute: 0.1 10*3/uL (ref 0.0–0.7)
HCT: 41.9 % (ref 39.0–52.0)
HEMOGLOBIN: 13.5 g/dL (ref 13.0–17.0)
Lymphocytes Relative: 23.3 % (ref 12.0–46.0)
Lymphs Abs: 2.6 10*3/uL (ref 0.7–4.0)
MCHC: 32.3 g/dL (ref 30.0–36.0)
MCV: 84.3 fl (ref 78.0–100.0)
Monocytes Absolute: 0.7 10*3/uL (ref 0.1–1.0)
Monocytes Relative: 5.9 % (ref 3.0–12.0)
NEUTROS PCT: 69.1 % (ref 43.0–77.0)
Neutro Abs: 7.6 10*3/uL (ref 1.4–7.7)
Platelets: 428 10*3/uL — ABNORMAL HIGH (ref 150.0–400.0)
RBC: 4.97 Mil/uL (ref 4.22–5.81)
RDW: 16.2 % — ABNORMAL HIGH (ref 11.5–15.5)
WBC: 11.1 10*3/uL — AB (ref 4.0–10.5)

## 2014-05-06 LAB — HIGH SENSITIVITY CRP: CRP HIGH SENSITIVITY: 24.47 mg/L — AB (ref 0.000–5.000)

## 2014-05-06 MED ORDER — BUDESONIDE 9 MG PO TB24: 9.0000 mg | ORAL_TABLET | Freq: Every day | ORAL | Status: DC

## 2014-05-06 MED ORDER — SULFASALAZINE 500 MG PO TABS: 1000.0000 mg | ORAL_TABLET | Freq: Four times a day (QID) | ORAL | Status: DC

## 2014-05-06 NOTE — Telephone Encounter (Signed)
-----   Message from Mound D. Zehr, PA-C sent at 05/06/2014  1:12 PM EST ----- Please let patient know that we are going to make a change to the medications that we prescribed him today.  I did not realize that he had ALL of his colon removed except his rectum.  Therefore, the uceris that we prescribed is likely not necessary.  Instead, please have him try Canasa suppositories and send a prescription for one per day, which will work locally in the rectum.  I would still like him to increase the sulfasalazine for now as well, but have him let us know if he is seeing the pills coming out whole in his stool.  Also, let us know if he is not getting better in a couple of weeks.  The diarrhea may be somewhat from the simple fact that he does not have a colon, but the bleeding likely indicates that he has a flare in his rectum.    Thank you,  Jess

## 2014-05-06 NOTE — Progress Notes (Addendum)
     05/06/2014 Robert Rhodes [id number] 1945-08-23   History of Present Illness:  This is a 68 year old male who is known to Dr. Hilarie Fredrickson.  He was seen in 06/2013 to establish care for his UC.  UC initially diagnosed in 1992.  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  Underwent laparoscopic total abdominal colectomy in 08/2013 by Dr. Marcello Moores.  Did not require chemo or radiation.  He says that since the end of August he has been "having a flare of his UC".  Having diarrhea sometimes 10 times per day, some with blood.  Reports discomfort in his bottom from wiping so much.  Denies abdominal pain.  He is currently on sulfasalazine 500 mg four times per day.  Has been taking his medication regularly.  Says that this is really the first time he's had a "flare" of this UC while actually on the medication.   Current Medications, Allergies, Past Medical History, Past Surgical History, Family History and Social History were reviewed in [employer] record.   Physical Exam: BP 110/66 mmHg  Pulse 84  Ht 5' 6.14" (1.68 m)  Wt 193 lb (87.544 kg)  BMI 31.02 kg/m2 General: Well developed black male in no acute distress Head: Normocephalic and atraumatic Eyes:  Sclerae anicteric, conjunctiva pink  Ears: Normal auditory acuity Lungs: Clear throughout to auscultation Heart: Regular rate and rhythm Abdomen: Soft, non-distended.  Normal bowel sounds.  Non-tender.  Scars noted from previous surgeries, one with a small area still healing. Musculoskeletal: Symmetrical with no gross deformities  Extremities: No edema  Neurological: Alert oriented x 4, grossly non-focal Psychological:  Alert and cooperative. Normal mood and affect  Assessment and Recommendations: -Ulcerative colitis with flare:  Will check stool for Cdiff.  Check CBC, CMP, and high-sensitivity CRP.  Will increase his sulfasalazine to 1 gram four times per day.  Will start Uceris 9  mg daily.  Follow-up in 6-8 weeks. -Colon cancer:  Stage I s/p resection in 08/2013.  Repeat colonoscopy (will be flex sig only) due 08/2014.  *Addendum:  I did not realize while the patient was here that he had a TOTAL colectomy and only has a rectum left.  Will contact patient with medication changes, no Uceris, but will try Canasa suppositories daily instead.  I would still like him to increase the sulfasalazine for now as well, but will have him let us know if he is seeing the pills coming out whole in his stool. Also, he will let us know if he is not getting better in a couple of weeks. The diarrhea may be somewhat from the simple fact that he does not have a colon, but the bleeding likely indicates that he has a flare in his rectum and he may need a flex sig sooner if no improvement in his symptoms.   Addendum: Reviewed and agree with initial management. Jerene Bears, MD

## 2014-05-06 NOTE — Patient Instructions (Addendum)
Your physician has requested that you go to the basement for the following lab work before leaving today: CBC, CRP, C-Diff, CMET  We have sent the following medications to your pharmacy for you to pick up at your convenience: Uceris Sulfasalazine  You have been scheduled for a follow-up appointment with Dr.Pyrtle on July 02, 2014 at 10:00 am.

## 2014-05-06 NOTE — Telephone Encounter (Signed)
Called pharmacy and cancelled the Uceris and ordered Canasa suppositories. Left a message for patient to call me.

## 2014-05-07 NOTE — Telephone Encounter (Signed)
Spoke with patient and gave him recommendations.

## 2014-06-23 ENCOUNTER — Encounter: Payer: Self-pay | Admitting: *Deleted

## 2014-07-02 ENCOUNTER — Ambulatory Visit (INDEPENDENT_AMBULATORY_CARE_PROVIDER_SITE_OTHER): Payer: Commercial Managed Care - HMO | Admitting: Internal Medicine

## 2014-07-02 ENCOUNTER — Telehealth: Payer: Self-pay | Admitting: *Deleted

## 2014-07-02 ENCOUNTER — Encounter: Payer: Self-pay | Admitting: Internal Medicine

## 2014-07-02 VITALS — BP 128/60 | HR 88 | Ht 66.0 in | Wt 183.5 lb

## 2014-07-02 DIAGNOSIS — K7689 Other specified diseases of liver: Secondary | ICD-10-CM

## 2014-07-02 DIAGNOSIS — K208 Other esophagitis: Secondary | ICD-10-CM | POA: Diagnosis not present

## 2014-07-02 DIAGNOSIS — K769 Liver disease, unspecified: Secondary | ICD-10-CM

## 2014-07-02 DIAGNOSIS — K2210 Ulcer of esophagus without bleeding: Secondary | ICD-10-CM

## 2014-07-02 DIAGNOSIS — R933 Abnormal findings on diagnostic imaging of other parts of digestive tract: Secondary | ICD-10-CM

## 2014-07-02 DIAGNOSIS — C189 Malignant neoplasm of colon, unspecified: Secondary | ICD-10-CM | POA: Diagnosis not present

## 2014-07-02 DIAGNOSIS — K5190 Ulcerative colitis, unspecified, without complications: Secondary | ICD-10-CM

## 2014-07-02 NOTE — Progress Notes (Signed)
Subjective:    Patient ID: Robert Rhodes, male    DOB: 06-07-1946, 70 y.o.   MRN: [id number]  HPI Robert Rhodes is a 69 yo male with PMH of ulcerative colitis who was initially seen in January 2015. He then came for colonoscopy on 06/25/2013 which revealed moderate melanosis and a firm mass near the IC valve. This was an adenocarcinoma. 3 other polyps were removed. He then had a subtotal colectomy with ileorectal anastomosis performed by Dr. Marcello Moores on 08/23/2013.Marland Kitchen He was seen by oncology and after surgery there was no lymphovascular invasion and 22 lymph nodes were negative for metastatic carcinoma. Cross-sectional imaging did reveal thickening in the esophagus and upper EUS and EGD was performed by Dr. Ardis Hughs in February 2015. This showed severe ulcerative esophagitis at the GE junction and a medium-sized hiatal hernia but no malignancy.  Later in 2015 he had he had a lap assisted surgical hernia repair (September 2015).  He has done well. He reports that he was having frequent bowel movements after surgery but this has seemed to improve. He is now having 3-4 bowel movements per day. He has taken over-the-counter "fiber bland" as well as sulfasalazine 500 mg 2 capsules 4 times daily. He occasionally sees some minor rectal bleeding but no melena. He is using Pepcid twice daily and with this reports improvement in heartburn symptomatology. He denies dysphagia.  Review of Systems As per history of present illness, otherwise negative  Current Medications, Allergies, Past Medical History, Past Surgical History, Family History and Social History were reviewed in [employer] record.     Objective:   Physical Exam BP 128/60 mmHg  Pulse 88  Ht 5' 6"  (1.676 m)  Wt 183 lb 8 oz (83.235 kg)  BMI 29.63 kg/m2 Constitutional: Well-developed and well-nourished. No distress. HEENT: Normocephalic and atraumatic. Oropharynx is clear and moist. No oropharyngeal exudate. Conjunctivae are  normal.  No scleral icterus. Neck: Neck supple. Trachea midline. Cardiovascular: Normal rate, regular rhythm and intact distal pulses. No M/R/G Pulmonary/chest: Effort normal and breath sounds normal. No wheezing, rales or rhonchi. Abdominal: Soft, nontender, nondistended. Bowel sounds active throughout. There are no masses palpable. No hepatosplenomegaly. Extremities: no clubbing, cyanosis, or edema Lymphadenopathy: No cervical adenopathy noted. Neurological: Alert and oriented to person place and time. Skin: Skin is warm and dry. No rashes noted. Psychiatric: Normal mood and affect. Behavior is normal.  CBC    Component Value Date/Time   WBC 11.1* 05/06/2014 1157   RBC 4.97 05/06/2014 1157   HGB 13.5 05/06/2014 1157   HCT 41.9 05/06/2014 1157   PLT 428.0* 05/06/2014 1157   MCV 84.3 05/06/2014 1157   MCH 27.9 02/21/2014 0447   MCHC 32.3 05/06/2014 1157   RDW 16.2* 05/06/2014 1157   LYMPHSABS 2.6 05/06/2014 1157   MONOABS 0.7 05/06/2014 1157   EOSABS 0.1 05/06/2014 1157   BASOSABS 0.0 05/06/2014 1157    CMP     Component Value Date/Time   NA 137 05/06/2014 1157   K 4.1 05/06/2014 1157   CL 101 05/06/2014 1157   CO2 28 05/06/2014 1157   GLUCOSE 83 05/06/2014 1157   BUN 8 05/06/2014 1157   CREATININE 1.0 05/06/2014 1157   CREATININE 0.90 03/05/2013 1021   CALCIUM 9.1 05/06/2014 1157   PROT 7.9 05/06/2014 1157   ALBUMIN 3.3* 05/06/2014 1157   AST 21 05/06/2014 1157   ALT 24 05/06/2014 1157   ALKPHOS 81 05/06/2014 1157   BILITOT 0.5 05/06/2014 1157   GFRNONAA  83* 02/21/2014 0447   GFRAA >90 02/21/2014 0447   MRI abdomen with contrast 07/05/2013 --Small subscapular right hepatic lobe nodule. Not meeting strict criteria for hepatic hemangioma. Atypical hemangioma favored. Close follow-up recommended to assess for size surveillance given the indeterminate features. Simple appearing caudate lobe cyst.     Assessment & Plan:  69 year old male with long-standing history  of ulcerative colitis diagnosed with right-sided colon cancer in January 2015 status post subtotal colectomy with ileorectal anastomosis, 15 cm of rectum, repair of incisional hernia, liver lesion of unclear significance, and severe esophagitis.  1. UC with colon cancer -- he has had resection of all but 15 cm of rectum. We discussed this at length today. I would never expect him to have formed more regular bowel movements. It is unclear if he still has active disease in the rectal segment but this may explain some of his rectal bleeding. I have recommended flexible sigmoidoscopy for direct visualization. He is on sulfasalazine which I will continue for now, but he may benefit from a PR medication such as Canasa if proctitis is found. I encouraged him to continue insoluble fiber to help bulk his stool.  2. Erosive esophagitis -- severe is seen by EGD and EUS in February 2015. He is on Pepcid twice a day with good control of symptoms. I recommend repeat upper endoscopy to document healing of esophagitis and exclude underlying Barrett's esophagus. The procedures were discussed in detail including the risks and benefits and he is agreeable to proceed.  3. Right lobe hepatic lesion -- given atypical features see my MR January 2015, I recommend repeat MRI abdomen with contrast. Recent liver enzymes normal.  40 minutes spent during this visit including greater than 50% discussing history of ulcerative colitis, colon cancer and management

## 2014-07-02 NOTE — Patient Instructions (Addendum)
You have been scheduled for a flexible sigmoidoscopy. Please follow the written instructions given to you at your visit today. If you use inhalers (even only as needed), please bring them with you on the day of your procedure.  Please continue Sulfasalazine at current dosage.  Please purchase the following medications over the counter and take as directed: Fiber Blend of your choice  Continue Pepcid twice daily.  CC:Dr [employer]

## 2014-07-02 NOTE — Addendum Note (Signed)
Addended by: Larina Bras on: 07/02/2014 04:27 PM   Modules accepted: Orders

## 2014-07-02 NOTE — Telephone Encounter (Signed)
Dr Hilarie Fredrickson has reviewed records again after patient left office visit. He would like patient to have egd added to flex sig due to hx of esophagitis in 07/2013. He would also like to follow up on MRI abd w/wo contrast that was done 07/05/13 at [facility]. Follow up for small subcapsular right hepatic lobe nodule. Patient will need repeat BUN/Creatinine done prior to MRI (his previous labs will be older than 60 days at time of appt). Endo has been added to flex and MRI has been scheduled at [facility] on 07/17/14 @ 5:00 pm.

## 2014-07-03 NOTE — Telephone Encounter (Signed)
I have attempted to contact patient again. No answer. I will call again at a later time.

## 2014-07-03 NOTE — Telephone Encounter (Signed)
I have again attempted to contact patient but have been unsuccessful.

## 2014-07-07 NOTE — Telephone Encounter (Signed)
I have again attempted to contact the patient with no response. I will talk to him at his appointment on 07/08/14.

## 2014-07-08 ENCOUNTER — Encounter: Payer: Self-pay | Admitting: Internal Medicine

## 2014-07-08 ENCOUNTER — Ambulatory Visit (AMBULATORY_SURGERY_CENTER): Payer: Commercial Managed Care - HMO | Admitting: Internal Medicine

## 2014-07-08 VITALS — BP 119/71 | HR 83 | Temp 97.4°F | Resp 12 | Ht 66.0 in | Wt 183.0 lb

## 2014-07-08 DIAGNOSIS — Z85038 Personal history of other malignant neoplasm of large intestine: Secondary | ICD-10-CM

## 2014-07-08 DIAGNOSIS — K5190 Ulcerative colitis, unspecified, without complications: Secondary | ICD-10-CM | POA: Diagnosis not present

## 2014-07-08 DIAGNOSIS — K209 Esophagitis, unspecified: Secondary | ICD-10-CM | POA: Diagnosis not present

## 2014-07-08 DIAGNOSIS — K219 Gastro-esophageal reflux disease without esophagitis: Secondary | ICD-10-CM | POA: Diagnosis not present

## 2014-07-08 DIAGNOSIS — E669 Obesity, unspecified: Secondary | ICD-10-CM | POA: Diagnosis not present

## 2014-07-08 DIAGNOSIS — C189 Malignant neoplasm of colon, unspecified: Secondary | ICD-10-CM

## 2014-07-08 DIAGNOSIS — K529 Noninfective gastroenteritis and colitis, unspecified: Secondary | ICD-10-CM | POA: Diagnosis not present

## 2014-07-08 DIAGNOSIS — K2210 Ulcer of esophagus without bleeding: Secondary | ICD-10-CM

## 2014-07-08 DIAGNOSIS — K208 Other esophagitis: Secondary | ICD-10-CM | POA: Diagnosis not present

## 2014-07-08 MED ORDER — SODIUM CHLORIDE 0.9 % IV SOLN: 500.0000 mL | INTRAVENOUS | Status: DC

## 2014-07-08 MED ORDER — MESALAMINE 1000 MG RE SUPP: 1000.0000 mg | Freq: Every day | RECTAL | Status: DC

## 2014-07-08 NOTE — Progress Notes (Signed)
Report to PACU, RN, vss, BBS= Clear.  

## 2014-07-08 NOTE — Telephone Encounter (Signed)
I have spoken to patient and have given him all of the information below (regarding endoscopy and MRI). He verbalizes understanding of this.

## 2014-07-08 NOTE — Progress Notes (Signed)
Called to room to assist during endoscopic procedure.  Patient ID and intended procedure confirmed with present staff. Received instructions for my participation in the procedure from the performing physician.  

## 2014-07-08 NOTE — Op Note (Signed)
[place]  [address] [place], [id number]   ENDOSCOPY PROCEDURE REPORT  PATIENT: Uriel, Dowding  MR#: [id number] BIRTHDATE: [birth year] , 30  yrs. old GENDER: male ENDOSCOPIST: Jerene Bears, MD PROCEDURE DATE:  07/08/2014 PROCEDURE:  EGD w/ biopsy ASA CLASS:     Class III INDICATIONS:  history of reflux esophagitis and follow-up of erosive esophagitis. MEDICATIONS: Monitored anesthesia care and Propofol 150 mg IV TOPICAL ANESTHETIC: none  DESCRIPTION OF PROCEDURE: After the risks benefits and alternatives of the procedure were thoroughly explained, informed consent was obtained.  The LB [id number] endoscope was introduced through the mouth and advanced to the second portion of the duodenum , Without limitations.  The instrument was slowly withdrawn as the mucosa was fully examined.   ESOPHAGUS: The z-line was located at 36 cm.  The z line appeared irregular and biopsies were taken.  previously seen esophagitis has healed.  STOMACH: A small hiatal hernia was noted.   The mucosa of the stomach appeared normal.  DUODENUM: The duodenal mucosa showed no abnormalities in the bulb and 2nd part of the duodenum.  Retroflexed views revealed a hiatal hernia.     The scope was then withdrawn from the patient and the procedure completed.  COMPLICATIONS: There were no immediate complications.  ENDOSCOPIC IMPRESSION: 1.   The z-line was located 36 cm and was a regular, query short segment Barrett's esophagus, multiple biopsies. Previous is seen esophagitis has healed 2.   Small hiatal hernia 3.   The mucosa of the stomach appeared normal 4.   The duodenal mucosa showed no abnormalities in the bulb and 2nd part of the duodenum  RECOMMENDATIONS: 1.  Await biopsy results 2.  Continue current medications  eSigned:  Jerene Bears, MD 07/08/2014 2:42 PM   CC:The Patient, Dr. Doreene Burke, MD

## 2014-07-08 NOTE — Op Note (Signed)
[place]  [address]. [place], [id number]   FLEXIBLE SIGMOIDOSCOPY PROCEDURE REPORT  PATIENT: Robert Rhodes, Robert Rhodes  MR#: [id number] BIRTHDATE: 1945-12-11 , 85  yrs. old GENDER: male ENDOSCOPIST: Jerene Bears, MD PROCEDURE DATE:  07/08/2014 PROCEDURE:   Sigmoidoscopy with biopsy ASA CLASS:   Class III INDICATIONS:follow up for previously diagnosed ulcerative colitis. high risk personal history of colon cancer and s/p subtotal colectomy with ileorectal anastomosis in 2015 MEDICATIONS: Monitored anesthesia care, Propofol 50 mg IV, and Residual sedation present  DESCRIPTION OF PROCEDURE:   After the risks benefits and alternatives of the procedure were thoroughly explained, informed consent was obtained.  Digital exam revealed no abnormalities of the rectum. The LB [id number] [id number]  endoscope was introduced through the anus  and advanced to the surgical anastomosis , The exam was Without limitations.    The quality of the prep was The overall prep quality was adequate. .  The instrument was then slowly withdrawn as the mucosa was fully examined.      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 The mucosa was edematous, erythematous and had ulcers.  This is consistent with ulcerative colitis disease.  Multiple biopsies were performed.  Approximately 5-8 cm of the distal rectum is involved. Above this area the remaining rectal stump appears normal and the ileorectal anastomosis is patent and healthy. Normal appearing neoterminal ileum.  Retroflexed views revealed no abnormalities. The scope was then withdrawn from the patient and the procedure terminated.  COMPLICATIONS: There were no immediate complications.  ENDOSCOPIC IMPRESSION: 1.   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   Healthy and patent ileorectal anastomosis  RECOMMENDATIONS: 1.  Begin Canasa 1 g at bedtime 2.  Continue  sulfasalazine 3.  Await biopsy results 4.  Office follow-up in 3 months  eSigned:  Jerene Bears, MD 07/08/2014 2:49 PM   CC:the patient, Dr. Doreene Burke, MD

## 2014-07-08 NOTE — Patient Instructions (Signed)
YOU HAD AN ENDOSCOPIC PROCEDURE TODAY AT [facility]: Refer to the procedure report that was given to you for any specific questions about what was found during the examination.  If the procedure report does not answer your questions, please call your gastroenterologist to clarify.  If you requested that your care partner not be given the details of your procedure findings, then the procedure report has been included in a sealed envelope for you to review at your convenience later.  YOU SHOULD EXPECT: Some feelings of bloating in the abdomen. Passage of more gas than usual.  Walking can help get rid of the air that was put into your GI tract during the procedure and reduce the bloating. If you had a lower endoscopy (such as a colonoscopy or flexible sigmoidoscopy) you may notice spotting of blood in your stool or on the toilet paper. If you underwent a bowel prep for your procedure, then you may not have a normal bowel movement for a few days.  DIET: Your first meal following the procedure should be a light meal and then it is ok to progress to your normal diet.  A half-sandwich or bowl of soup is an example of a good first meal.  Heavy or fried foods are harder to digest and may make you feel nauseous or bloated.  Likewise meals heavy in dairy and vegetables can cause extra gas to form and this can also increase the bloating.  Drink plenty of fluids but you should avoid alcoholic beverages for 24 hours.  ACTIVITY: Your care partner should take you home directly after the procedure.  You should plan to take it easy, moving slowly for the rest of the day.  You can resume normal activity the day after the procedure however you should NOT DRIVE or use heavy machinery for 24 hours (because of the sedation medicines used during the test).    SYMPTOMS TO REPORT IMMEDIATELY: A gastroenterologist can be reached at any hour.  During normal business hours, 8:30 AM to 5:00 PM Monday through Friday,  call [phone].  After hours and on weekends, please call the GI answering service at [phone] who will take a message and have the physician on call contact you.   Following lower endoscopy (colonoscopy or flexible sigmoidoscopy):  Excessive amounts of blood in the stool  Significant tenderness or worsening of abdominal pains  Swelling of the abdomen that is new, acute  Fever of 100F or higher  Following upper endoscopy (EGD)  Vomiting of blood or coffee ground material  New chest pain or pain under the shoulder blades  Painful or persistently difficult swallowing  New shortness of breath  Fever of 100F or higher  Black, tarry-looking stools  FOLLOW UP: If any biopsies were taken you will be contacted by phone or by letter within the next 1-3 weeks.  Call your gastroenterologist if you have not heard about the biopsies in 3 weeks.  Our staff will call the home number listed on your records the next business day following your procedure to check on you and address any questions or concerns that you may have at that time regarding the information given to you following your procedure. This is a courtesy call and so if there is no answer at the home number and we have not heard from you through the emergency physician on call, we will assume that you have returned to your regular daily activities without incident.  SIGNATURES/CONFIDENTIALITY: You and/or your care  partner have signed paperwork which will be entered into your electronic medical record.  These signatures attest to the fact that that the information above on your After Visit Summary has been reviewed and is understood.  Full responsibility of the confidentiality of this discharge information lies with you and/or your care-partner.  Resume medications. Information given on Hiatal Hernia with discharge instructions.

## 2014-07-09 ENCOUNTER — Telehealth: Payer: Self-pay | Admitting: *Deleted

## 2014-07-09 NOTE — Telephone Encounter (Signed)
Patient's telephone number provided does not accept incoming calls. Unable to leave a message or speak with the patient.

## 2014-07-15 ENCOUNTER — Encounter: Payer: Self-pay | Admitting: Internal Medicine

## 2014-07-17 ENCOUNTER — Ambulatory Visit (HOSPITAL_COMMUNITY)
Admission: RE | Admit: 2014-07-17 | Discharge: 2014-07-17 | Disposition: A | Discharge status: Home / Self Care | Payer: Commercial Managed Care - HMO | Source: Ambulatory Visit | Attending: Internal Medicine | Admitting: Internal Medicine

## 2014-07-17 DIAGNOSIS — K6389 Other specified diseases of intestine: Secondary | ICD-10-CM

## 2014-07-17 DIAGNOSIS — R933 Abnormal findings on diagnostic imaging of other parts of digestive tract: Secondary | ICD-10-CM | POA: Insufficient documentation

## 2014-07-17 DIAGNOSIS — R918 Other nonspecific abnormal finding of lung field: Secondary | ICD-10-CM | POA: Insufficient documentation

## 2014-07-17 LAB — POCT I-STAT CREATININE: Creatinine, Ser: 0.9 mg/dL (ref 0.50–1.35)

## 2014-07-21 ENCOUNTER — Telehealth: Payer: Self-pay | Admitting: Internal Medicine

## 2014-07-21 DIAGNOSIS — R935 Abnormal findings on diagnostic imaging of other abdominal regions, including retroperitoneum: Secondary | ICD-10-CM

## 2014-07-21 NOTE — Telephone Encounter (Signed)
Could attempt open MRI Can also use ativan 1 mg PO 30 min to 1 hour before MRI

## 2014-07-21 NOTE — Telephone Encounter (Signed)
Patient is claustrophobic and he could not do the MRI. He was told to call and see about something for sedation. Patient is not sure he can do the MRI if he is awake at all. Please, advise.

## 2014-07-22 ENCOUNTER — Other Ambulatory Visit: Payer: Self-pay | Admitting: *Deleted

## 2014-07-22 ENCOUNTER — Other Ambulatory Visit: Payer: Self-pay | Admitting: Internal Medicine

## 2014-07-22 DIAGNOSIS — R935 Abnormal findings on diagnostic imaging of other abdominal regions, including retroperitoneum: Secondary | ICD-10-CM

## 2014-07-22 DIAGNOSIS — H2011 Chronic iridocyclitis, right eye: Secondary | ICD-10-CM | POA: Diagnosis not present

## 2014-07-22 MED ORDER — LORAZEPAM 1 MG PO TABS: ORAL_TABLET | ORAL | Status: DC

## 2014-07-22 NOTE — Telephone Encounter (Signed)
Scheduled open MRI at [facility] on 08/05/14 at 3:45 PM [address]. No solid food 4 hours prior. Left a message for patient to cal back.

## 2014-07-23 ENCOUNTER — Other Ambulatory Visit: Payer: Self-pay | Admitting: *Deleted

## 2014-07-23 NOTE — Telephone Encounter (Signed)
Patient given appointment and instructions. Rx faxed to pharmacy.

## 2014-08-05 ENCOUNTER — Inpatient Hospital Stay: Admission: RE | Admit: 2014-08-05 | Payer: Medicare HMO | Source: Ambulatory Visit

## 2014-08-13 ENCOUNTER — Encounter: Payer: Self-pay | Admitting: Internal Medicine

## 2014-08-22 ENCOUNTER — Inpatient Hospital Stay (HOSPITAL_COMMUNITY)
Admission: EM | Admit: 2014-08-22 | Discharge: 2014-08-27 | DRG: 871 | Disposition: A | Discharge status: Other Acute Inpatient Hospital | Payer: Commercial Managed Care - HMO | Attending: Internal Medicine | Admitting: Internal Medicine

## 2014-08-22 ENCOUNTER — Encounter (HOSPITAL_COMMUNITY): Payer: Self-pay | Admitting: Emergency Medicine

## 2014-08-22 DIAGNOSIS — Z6823 Body mass index (BMI) 23.0-23.9, adult: Secondary | ICD-10-CM | POA: Diagnosis not present

## 2014-08-22 DIAGNOSIS — R197 Diarrhea, unspecified: Secondary | ICD-10-CM | POA: Diagnosis not present

## 2014-08-22 DIAGNOSIS — Z833 Family history of diabetes mellitus: Secondary | ICD-10-CM | POA: Diagnosis not present

## 2014-08-22 DIAGNOSIS — Z9119 Patient's noncompliance with other medical treatment and regimen: Secondary | ICD-10-CM | POA: Diagnosis not present

## 2014-08-22 DIAGNOSIS — L89302 Pressure ulcer of unspecified buttock, stage 2: Secondary | ICD-10-CM | POA: Diagnosis not present

## 2014-08-22 DIAGNOSIS — E876 Hypokalemia: Secondary | ICD-10-CM | POA: Diagnosis present

## 2014-08-22 DIAGNOSIS — R030 Elevated blood-pressure reading, without diagnosis of hypertension: Secondary | ICD-10-CM | POA: Diagnosis not present

## 2014-08-22 DIAGNOSIS — E860 Dehydration: Secondary | ICD-10-CM | POA: Diagnosis not present

## 2014-08-22 DIAGNOSIS — A419 Sepsis, unspecified organism: Principal | ICD-10-CM | POA: Diagnosis present

## 2014-08-22 DIAGNOSIS — C189 Malignant neoplasm of colon, unspecified: Secondary | ICD-10-CM | POA: Diagnosis not present

## 2014-08-22 DIAGNOSIS — E43 Unspecified severe protein-calorie malnutrition: Secondary | ICD-10-CM | POA: Diagnosis present

## 2014-08-22 DIAGNOSIS — M810 Age-related osteoporosis without current pathological fracture: Secondary | ICD-10-CM | POA: Diagnosis present

## 2014-08-22 DIAGNOSIS — Z809 Family history of malignant neoplasm, unspecified: Secondary | ICD-10-CM | POA: Diagnosis not present

## 2014-08-22 DIAGNOSIS — L03116 Cellulitis of left lower limb: Secondary | ICD-10-CM | POA: Diagnosis not present

## 2014-08-22 DIAGNOSIS — M1990 Unspecified osteoarthritis, unspecified site: Secondary | ICD-10-CM | POA: Diagnosis present

## 2014-08-22 DIAGNOSIS — F40240 Claustrophobia: Secondary | ICD-10-CM | POA: Diagnosis present

## 2014-08-22 DIAGNOSIS — L0390 Cellulitis, unspecified: Secondary | ICD-10-CM | POA: Diagnosis present

## 2014-08-22 DIAGNOSIS — F29 Unspecified psychosis not due to a substance or known physiological condition: Secondary | ICD-10-CM | POA: Diagnosis present

## 2014-08-22 DIAGNOSIS — Z8249 Family history of ischemic heart disease and other diseases of the circulatory system: Secondary | ICD-10-CM

## 2014-08-22 DIAGNOSIS — K219 Gastro-esophageal reflux disease without esophagitis: Secondary | ICD-10-CM | POA: Diagnosis present

## 2014-08-22 DIAGNOSIS — Z87891 Personal history of nicotine dependence: Secondary | ICD-10-CM

## 2014-08-22 DIAGNOSIS — Z91199 Patient's noncompliance with other medical treatment and regimen due to unspecified reason: Secondary | ICD-10-CM

## 2014-08-22 DIAGNOSIS — M7989 Other specified soft tissue disorders: Secondary | ICD-10-CM | POA: Diagnosis present

## 2014-08-22 DIAGNOSIS — K51911 Ulcerative colitis, unspecified with rectal bleeding: Secondary | ICD-10-CM | POA: Diagnosis present

## 2014-08-22 DIAGNOSIS — Z9049 Acquired absence of other specified parts of digestive tract: Secondary | ICD-10-CM | POA: Diagnosis present

## 2014-08-22 DIAGNOSIS — R410 Disorientation, unspecified: Secondary | ICD-10-CM | POA: Diagnosis not present

## 2014-08-22 DIAGNOSIS — F22 Delusional disorders: Secondary | ICD-10-CM | POA: Diagnosis not present

## 2014-08-22 DIAGNOSIS — R627 Adult failure to thrive: Secondary | ICD-10-CM | POA: Diagnosis not present

## 2014-08-22 DIAGNOSIS — F24 Shared psychotic disorder: Secondary | ICD-10-CM | POA: Diagnosis not present

## 2014-08-22 DIAGNOSIS — F23 Brief psychotic disorder: Secondary | ICD-10-CM | POA: Diagnosis not present

## 2014-08-22 LAB — CBC
HCT: 42.7 % (ref 39.0–52.0)
HEMOGLOBIN: 14.3 g/dL (ref 13.0–17.0)
MCH: 28.9 pg (ref 26.0–34.0)
MCHC: 33.5 g/dL (ref 30.0–36.0)
MCV: 86.3 fL (ref 78.0–100.0)
Platelets: 368 10*3/uL (ref 150–400)
RBC: 4.95 MIL/uL (ref 4.22–5.81)
RDW: 14.1 % (ref 11.5–15.5)
WBC: 14.6 10*3/uL — AB (ref 4.0–10.5)

## 2014-08-22 LAB — COMPREHENSIVE METABOLIC PANEL
ALT: 68 U/L — ABNORMAL HIGH (ref 0–53)
AST: 91 U/L — ABNORMAL HIGH (ref 0–37)
Albumin: 3.3 g/dL — ABNORMAL LOW (ref 3.5–5.2)
Alkaline Phosphatase: 71 U/L (ref 39–117)
Anion gap: 14 (ref 5–15)
BUN: 20 mg/dL (ref 6–23)
CO2: 28 mmol/L (ref 19–32)
CREATININE: 1.22 mg/dL (ref 0.50–1.35)
Calcium: 9 mg/dL (ref 8.4–10.5)
Chloride: 95 mmol/L — ABNORMAL LOW (ref 96–112)
GFR calc Af Amer: 68 mL/min — ABNORMAL LOW (ref >90)
GFR, EST NON AFRICAN AMERICAN: 58 mL/min — AB (ref >90)
Glucose, Bld: 101 mg/dL — ABNORMAL HIGH (ref 70–99)
Potassium: 3 mmol/L — ABNORMAL LOW (ref 3.5–5.1)
Sodium: 137 mmol/L (ref 135–145)
TOTAL PROTEIN: 7.7 g/dL (ref 6.0–8.3)
Total Bilirubin: 1.1 mg/dL (ref 0.3–1.2)

## 2014-08-22 LAB — ETHANOL: Alcohol, Ethyl (B): <5 mg/dL (ref 0–9)

## 2014-08-22 LAB — I-STAT TROPONIN, ED: Troponin i, poc: 0.02 ng/mL (ref 0.00–0.08)

## 2014-08-22 MED ORDER — POTASSIUM CHLORIDE CRYS ER 20 MEQ PO TBCR
40.0000 meq | EXTENDED_RELEASE_TABLET | Freq: Once | ORAL | Status: AC
  Administered 2014-08-23: 40 meq via ORAL
  Filled 2014-08-22: qty 2

## 2014-08-22 MED ORDER — VANCOMYCIN HCL IN DEXTROSE 1-5 GM/200ML-% IV SOLN
1000.0000 mg | INTRAVENOUS | Status: AC
  Administered 2014-08-23: 1000 mg via INTRAVENOUS
  Filled 2014-08-22: qty 200

## 2014-08-22 NOTE — ED Notes (Addendum)
Upon assessment pt. Eyes closed , pt. Noted to be stiff on both arms, making fists and raising them up on air, claimed that he is doing it " because he is a spiritual man" , but denies in pain.

## 2014-08-22 NOTE — ED Notes (Signed)
Bed: [facility] Expected date:  Expected time:  Means of arrival:  Comments: EMS failure to thrive - IVC

## 2014-08-22 NOTE — ED Provider Notes (Signed)
CSN: [id number]     Arrival date & time 08/22/14  2048 History   First MD Initiated Contact with Patient 08/22/14 2109     Chief Complaint  Patient presents with  . Failure To Thrive     (Consider location/radiation/quality/duration/timing/severity/associated sxs/prior Treatment) HPI Comments: 69 year old male past medical history of ulcerative colitis, osteoporosis and colon cancer brought into the ED by police under IVC taken out by family members with concerns of failure to thrive and abnormal behavior. He is noncompliant with medical regimen, refuses take care of himself, has not showered in over a week, playing in the house in his own feces and urine. Family has noticed blood in his stool which has been ongoing, and is being evaluated by GI, has an abdominal MRI scheduled on 08/28/2014. Had a poor appetite over the past 2 months. He is refusing to seek medical care which is why the IVC was taken out. It is noted he has read on his beard, states he was drinking V8 fusion juice prior to coming to the ED. He is making abnormal stiff movements of his arms, making fists, making "chopping" movements of his hands, stating he is doing it "because I am a spiritual man, the demons make me do so". Denies any pain at this time. Has no complaints.  The history is provided by the police and the patient.    Past Medical History  Diagnosis Date  . Vitamin D deficiency   . UC (ulcerative colitis)   . Arthritis   . Osteoporosis   . GERD (gastroesophageal reflux disease)   . Colon cancer   . Eye abnormalities     right eye seen in ED 02/17/2014 wearing eye patch using eye drops  . Claustrophobia   . Adenomatous colon polyp    Past Surgical History  Procedure Laterality Date  . Ventral hernia repair    . Eus N/A 07/18/2013    Procedure: UPPER ENDOSCOPIC ULTRASOUND (EUS) RADIAL;  Surgeon: Milus Banister, MD;  Location: [facility];  Service: Endoscopy;  Laterality: N/A;  . Intraocular lens insertion  Bilateral     6 yrs ago  . Colon resection N/A 08/23/2013    Procedure: Laparoscopic total abdominal colectomy and hernia repair;  Surgeon: Leighton Ruff, MD;  Location: [facility];  Service: General;  Laterality: N/A;  . Colon surgery    . Eye surgery    . Hernia repair    . Incisional hernia repair N/A 02/20/2014    Procedure: LAP ASSISTED INCISIONAL HERNIA REPAIR LYSIS OF ADHESIONS;  Surgeon: Leighton Ruff, MD;  Location: [facility];  Service: General;  Laterality: N/A;  converted to open @ 0935   . Incisional hernia repair N/A 02/20/2014    Procedure: HERNIA REPAIR INCISIONAL;  Surgeon: Leighton Ruff, MD;  Location: [facility];  Service: General;  Laterality: N/A;  With MESH  . Colonoscopy     Family History  Problem Relation Age of Onset  . Cancer Mother     type unknown  . Diabetes Father   . Heart disease Father   . Heart disease Sister   . Heart attack Brother   . Colon cancer Neg Hx   . Esophageal cancer Neg Hx   . Rectal cancer Neg Hx   . Stomach cancer Neg Hx    History  Substance Use Topics  . Smoking status: Former Smoker -- 1.00 packs/day    Types: Cigarettes    Quit date: 06/14/2003  . Smokeless tobacco: Never Used  Comment: quit in July 2005  . Alcohol Use: No    Review of Systems   10 Systems reviewed and are negative for acute change except as noted in the HPI.   Allergies  Ace inhibitors; Advil; Aspirin; and Tylenol  Home Medications   Prior to Admission medications   Medication Sig Start Date End Date Taking? Authorizing Provider  cholecalciferol (VITAMIN D) 1000 UNITS tablet Take 1,000 Units by mouth daily.   Yes Historical Provider, MD  famotidine (PEPCID) 20 MG tablet Take 1 tablet (20 mg total) by mouth 2 (two) times daily. 01/14/14  Yes Tresa Garter, MD  sulfaSALAzine (AZULFIDINE) 500 MG tablet Take 2 tablets (1,000 mg total) by mouth 4 (four) times daily. 05/06/14  Yes Jessica D Zehr, PA-C  vitamin A 10000 UNIT capsule Take 10,000 Units by mouth  daily.   Yes Historical Provider, MD  vitamin E 100 UNIT capsule Take 100 Units by mouth daily.   Yes Historical Provider, MD  AMBULATORY NON FORMULARY MEDICATION Take 1 tablet by mouth daily. Slippery elm    Historical Provider, MD  AMBULATORY NON FORMULARY MEDICATION Take 2 drops by mouth 2 (two) times daily. Peppermint Oil    Historical Provider, MD  AMBULATORY NON FORMULARY MEDICATION Take 15 mLs by mouth daily. Raspberry fiber blend    Historical Provider, MD  AMBULATORY NON FORMULARY MEDICATION Artichoke  Take 1 capsule by mouth daily    Historical Provider, MD  atropine 1 % ophthalmic solution Place 1 drop into the right eye 2 (two) times daily.    Historical Provider, MD  Budesonide 9 MG TB24 Take 9 mg by mouth daily. Patient not taking: Reported on 07/08/2014 05/06/14   Laban Emperor Zehr, PA-C  cyclobenzaprine (FLEXERIL) 5 MG tablet Take 1 tablet (5 mg total) by mouth every 4 (four) hours as needed for muscle spasms. Patient not taking: Reported on 08/24/9700 6/37/85   Leighton Ruff, MD  Difluprednate 0.05 % EMUL Apply 1 drop to eye daily. Every hour while awake    Historical Provider, MD  LORazepam (ATIVAN) 1 MG tablet Take one po 30 minutes to 1 hour prior to MRI Patient not taking: Reported on 08/22/2014 07/22/14   Jerene Bears, MD  mesalamine (CANASA) 1000 MG suppository Place 1 suppository (1,000 mg total) rectally at bedtime. Patient not taking: Reported on 08/22/2014 07/08/14   Jerene Bears, MD  oxyCODONE (OXY IR/ROXICODONE) 5 MG immediate release tablet Take 1-2 tablets (5-10 mg total) by mouth every 4 (four) hours as needed for moderate pain, severe pain or breakthrough pain. Patient not taking: Reported on 8/85/0277 09/22/85   Leighton Ruff, MD  Saw Palmetto 450 MG CAPS Take 2 capsules by mouth 2 (two) times daily.    Historical Provider, MD   BP 127/56 mmHg  Pulse 90  Temp(Src) 98.7 F (37.1 C) (Oral)  Resp 22  Ht 5' 8"  (1.727 m)  Wt 151 lb 11.2 oz (68.811 kg)  BMI 23.07 kg/m2   SpO2 99% Physical Exam  Constitutional: He is oriented to person, place, and time. No distress.  Unkempt, foul smell, very dirty.  HENT:  Head: Normocephalic and atraumatic.  Eyes: Conjunctivae are normal.  Neck: Neck supple.  Cardiovascular: Regular rhythm, normal heart sounds and intact distal pulses.   Tachy.  Pulmonary/Chest: Effort normal and breath sounds normal.  Neurological: He is alert and oriented to person, place, and time.  Skin:  Erythema and warmth along medial aspect of left lower extremity from mid-foot to  below knee. Abrasion on medial aspect of left foot.  Psychiatric: His speech is tangential.  Nursing note and vitals reviewed.   ED Course  Procedures (including critical care time) Labs Review Labs Reviewed  CBC - Abnormal; Notable for the following:    WBC 14.6 (*)    All other components within normal limits  COMPREHENSIVE METABOLIC PANEL - Abnormal; Notable for the following:    Potassium 3.0 (*)    Chloride 95 (*)    Glucose, Bld 101 (*)    Albumin 3.3 (*)    AST 91 (*)    ALT 68 (*)    GFR calc non Af Amer 58 (*)    GFR calc Af Amer 68 (*)    All other components within normal limits  URINALYSIS, ROUTINE W REFLEX MICROSCOPIC - Abnormal; Notable for the following:    Color, Urine AMBER (*)    Hgb urine dipstick MODERATE (*)    Protein, ur 30 (*)    All other components within normal limits  URINE RAPID DRUG SCREEN (HOSP PERFORMED) - Abnormal; Notable for the following:    Tetrahydrocannabinol POSITIVE (*)    All other components within normal limits  URINE MICROSCOPIC-ADD ON - Abnormal; Notable for the following:    Bacteria, UA FEW (*)    Casts HYALINE CASTS (*)    All other components within normal limits  CULTURE, BLOOD (ROUTINE X 2)  CULTURE, BLOOD (ROUTINE X 2)  URINE CULTURE  ETHANOL  I-STAT TROPOININ, ED    Imaging Review No results found.   EKG Interpretation None      MDM   Final diagnoses:  Cellulitis of left leg   Hypokalemia    Patient very foul-smelling and 30 on arrival. He was given a shower by the nursing staff. After getting cleaned up, it is noted he had cellulitis to his left lower extremity. Afebrile, tachycardic, leukocytosis of 14.6. IV vancomycin started. Potassium 3.0. Replaced IV and PO. Patient will be admitted for cellulitis. Admission accepted by Dr. Roel Cluck, [facility], tele bed. No family present with patient.  Discussed with attending Dr. Johnney Killian who also evaluated patient and agrees with plan of care.  Carman Ching, PA-C 08/23/14 7564  Charlesetta Shanks, MD 08/23/14 [phone]

## 2014-08-22 NOTE — ED Notes (Signed)
Per EMs , pt. From home who is here for the complaint of failure to thrive, pt. Has history ulcerative colitis , non compliant to medical regimen, refused to take care of himself , with poor appetite over 2 months now. Pt.'s family concern of his health declining, pt. Refused to come to ED,IVC. Pt. Is alert and oriented x3. Denies pain. Has clear speech and cooperative upon arrival to ED.

## 2014-08-23 ENCOUNTER — Encounter (HOSPITAL_COMMUNITY): Payer: Self-pay | Admitting: Internal Medicine

## 2014-08-23 DIAGNOSIS — F23 Brief psychotic disorder: Secondary | ICD-10-CM | POA: Diagnosis not present

## 2014-08-23 DIAGNOSIS — L03116 Cellulitis of left lower limb: Secondary | ICD-10-CM | POA: Insufficient documentation

## 2014-08-23 DIAGNOSIS — Z809 Family history of malignant neoplasm, unspecified: Secondary | ICD-10-CM | POA: Diagnosis not present

## 2014-08-23 DIAGNOSIS — A419 Sepsis, unspecified organism: Secondary | ICD-10-CM | POA: Diagnosis present

## 2014-08-23 DIAGNOSIS — R197 Diarrhea, unspecified: Secondary | ICD-10-CM | POA: Diagnosis not present

## 2014-08-23 DIAGNOSIS — Z91199 Patient's noncompliance with other medical treatment and regimen due to unspecified reason: Secondary | ICD-10-CM

## 2014-08-23 DIAGNOSIS — Z9119 Patient's noncompliance with other medical treatment and regimen: Secondary | ICD-10-CM | POA: Diagnosis present

## 2014-08-23 DIAGNOSIS — F22 Delusional disorders: Secondary | ICD-10-CM | POA: Diagnosis not present

## 2014-08-23 DIAGNOSIS — K219 Gastro-esophageal reflux disease without esophagitis: Secondary | ICD-10-CM | POA: Diagnosis present

## 2014-08-23 DIAGNOSIS — F40240 Claustrophobia: Secondary | ICD-10-CM | POA: Diagnosis present

## 2014-08-23 DIAGNOSIS — R410 Disorientation, unspecified: Secondary | ICD-10-CM | POA: Diagnosis not present

## 2014-08-23 DIAGNOSIS — E876 Hypokalemia: Secondary | ICD-10-CM | POA: Diagnosis present

## 2014-08-23 DIAGNOSIS — F24 Shared psychotic disorder: Secondary | ICD-10-CM | POA: Diagnosis not present

## 2014-08-23 DIAGNOSIS — Z6823 Body mass index (BMI) 23.0-23.9, adult: Secondary | ICD-10-CM | POA: Diagnosis not present

## 2014-08-23 DIAGNOSIS — C189 Malignant neoplasm of colon, unspecified: Secondary | ICD-10-CM

## 2014-08-23 DIAGNOSIS — L0390 Cellulitis, unspecified: Secondary | ICD-10-CM | POA: Diagnosis present

## 2014-08-23 DIAGNOSIS — Z8249 Family history of ischemic heart disease and other diseases of the circulatory system: Secondary | ICD-10-CM | POA: Diagnosis not present

## 2014-08-23 DIAGNOSIS — E860 Dehydration: Secondary | ICD-10-CM | POA: Diagnosis present

## 2014-08-23 DIAGNOSIS — M1990 Unspecified osteoarthritis, unspecified site: Secondary | ICD-10-CM | POA: Diagnosis present

## 2014-08-23 DIAGNOSIS — F29 Unspecified psychosis not due to a substance or known physiological condition: Secondary | ICD-10-CM | POA: Diagnosis present

## 2014-08-23 DIAGNOSIS — Z87891 Personal history of nicotine dependence: Secondary | ICD-10-CM | POA: Diagnosis not present

## 2014-08-23 DIAGNOSIS — E43 Unspecified severe protein-calorie malnutrition: Secondary | ICD-10-CM | POA: Diagnosis present

## 2014-08-23 DIAGNOSIS — L89302 Pressure ulcer of unspecified buttock, stage 2: Secondary | ICD-10-CM | POA: Diagnosis present

## 2014-08-23 DIAGNOSIS — Z9049 Acquired absence of other specified parts of digestive tract: Secondary | ICD-10-CM | POA: Diagnosis present

## 2014-08-23 DIAGNOSIS — R627 Adult failure to thrive: Secondary | ICD-10-CM | POA: Diagnosis present

## 2014-08-23 DIAGNOSIS — Z833 Family history of diabetes mellitus: Secondary | ICD-10-CM | POA: Diagnosis not present

## 2014-08-23 DIAGNOSIS — M810 Age-related osteoporosis without current pathological fracture: Secondary | ICD-10-CM | POA: Diagnosis present

## 2014-08-23 DIAGNOSIS — M7989 Other specified soft tissue disorders: Secondary | ICD-10-CM | POA: Diagnosis present

## 2014-08-23 DIAGNOSIS — K51911 Ulcerative colitis, unspecified with rectal bleeding: Secondary | ICD-10-CM | POA: Diagnosis present

## 2014-08-23 LAB — COMPREHENSIVE METABOLIC PANEL
ALBUMIN: 2.9 g/dL — AB (ref 3.5–5.2)
ALK PHOS: 60 U/L (ref 39–117)
ALT: 61 U/L — AB (ref 0–53)
AST: 82 U/L — ABNORMAL HIGH (ref 0–37)
Anion gap: 8 (ref 5–15)
BILIRUBIN TOTAL: 1 mg/dL (ref 0.3–1.2)
BUN: 16 mg/dL (ref 6–23)
CO2: 29 mmol/L (ref 19–32)
CREATININE: 1.07 mg/dL (ref 0.50–1.35)
Calcium: 8.5 mg/dL (ref 8.4–10.5)
Chloride: 98 mmol/L (ref 96–112)
GFR calc Af Amer: 79 mL/min — ABNORMAL LOW (ref >90)
GFR calc non Af Amer: 68 mL/min — ABNORMAL LOW (ref >90)
GLUCOSE: 106 mg/dL — AB (ref 70–99)
POTASSIUM: 3.1 mmol/L — AB (ref 3.5–5.1)
SODIUM: 135 mmol/L (ref 135–145)
TOTAL PROTEIN: 6.6 g/dL (ref 6.0–8.3)

## 2014-08-23 LAB — RAPID URINE DRUG SCREEN, HOSP PERFORMED
Amphetamines: NOT DETECTED
Barbiturates: NOT DETECTED
Benzodiazepines: NOT DETECTED
Cocaine: NOT DETECTED
OPIATES: NOT DETECTED
TETRAHYDROCANNABINOL: POSITIVE — AB

## 2014-08-23 LAB — URINALYSIS, ROUTINE W REFLEX MICROSCOPIC
Bilirubin Urine: NEGATIVE
Glucose, UA: NEGATIVE mg/dL
KETONES UR: NEGATIVE mg/dL
Leukocytes, UA: NEGATIVE
Nitrite: NEGATIVE
PROTEIN: 30 mg/dL — AB
Specific Gravity, Urine: 1.023 (ref 1.005–1.030)
Urobilinogen, UA: 0.2 mg/dL (ref 0.0–1.0)
pH: 6 (ref 5.0–8.0)

## 2014-08-23 LAB — CBC
HEMATOCRIT: 38.6 % — AB (ref 39.0–52.0)
Hemoglobin: 12.6 g/dL — ABNORMAL LOW (ref 13.0–17.0)
MCH: 28.1 pg (ref 26.0–34.0)
MCHC: 32.6 g/dL (ref 30.0–36.0)
MCV: 86.2 fL (ref 78.0–100.0)
PLATELETS: 334 10*3/uL (ref 150–400)
RBC: 4.48 MIL/uL (ref 4.22–5.81)
RDW: 14 % (ref 11.5–15.5)
WBC: 13.8 10*3/uL — AB (ref 4.0–10.5)

## 2014-08-23 LAB — PHOSPHORUS: Phosphorus: 2.7 mg/dL (ref 2.3–4.6)

## 2014-08-23 LAB — URINE MICROSCOPIC-ADD ON

## 2014-08-23 LAB — MAGNESIUM: MAGNESIUM: 2.5 mg/dL (ref 1.5–2.5)

## 2014-08-23 LAB — CLOSTRIDIUM DIFFICILE BY PCR: Toxigenic C. Difficile by PCR: NEGATIVE

## 2014-08-23 LAB — PREALBUMIN: Prealbumin: 9.6 mg/dL — ABNORMAL LOW (ref 17.0–34.0)

## 2014-08-23 LAB — TSH: TSH: 1.605 u[IU]/mL (ref 0.350–4.500)

## 2014-08-23 MED ORDER — OLANZAPINE 5 MG PO TABS
5.0000 mg | ORAL_TABLET | Freq: Every day | ORAL | Status: DC
Start: 2014-08-23 — End: 2014-08-27
  Administered 2014-08-23 – 2014-08-26 (×4): 5 mg via ORAL
  Filled 2014-08-23 (×5): qty 1

## 2014-08-23 MED ORDER — SODIUM CHLORIDE 0.9 % IV SOLN: INTRAVENOUS | Status: DC

## 2014-08-23 MED ORDER — ENOXAPARIN SODIUM 40 MG/0.4ML ~~LOC~~ SOLN
40.0000 mg | SUBCUTANEOUS | Status: DC
  Administered 2014-08-23 – 2014-08-27 (×3): 40 mg via SUBCUTANEOUS
  Filled 2014-08-23 (×5): qty 0.4

## 2014-08-23 MED ORDER — VANCOMYCIN HCL IN DEXTROSE 750-5 MG/150ML-% IV SOLN
750.0000 mg | Freq: Two times a day (BID) | INTRAVENOUS | Status: DC
  Administered 2014-08-23 – 2014-08-24 (×3): 750 mg via INTRAVENOUS
  Filled 2014-08-23 (×3): qty 150

## 2014-08-23 MED ORDER — POTASSIUM CHLORIDE 10 MEQ/100ML IV SOLN: 10.0000 meq | Freq: Once | INTRAVENOUS | Status: DC

## 2014-08-23 MED ORDER — POTASSIUM CHLORIDE 10 MEQ/100ML IV SOLN
10.0000 meq | Freq: Once | INTRAVENOUS | Status: AC
  Administered 2014-08-23: 10 meq via INTRAVENOUS

## 2014-08-23 MED ORDER — HYDROCODONE-ACETAMINOPHEN 5-325 MG PO TABS
1.0000 | ORAL_TABLET | ORAL | Status: DC | PRN
  Administered 2014-08-24: 1 via ORAL
  Filled 2014-08-23: qty 1

## 2014-08-23 MED ORDER — VITAMIN D 1000 UNITS PO TABS
1000.0000 [IU] | ORAL_TABLET | Freq: Every day | ORAL | Status: DC
  Administered 2014-08-23 – 2014-08-27 (×5): 1000 [IU] via ORAL
  Filled 2014-08-23 (×5): qty 1

## 2014-08-23 MED ORDER — FAMOTIDINE 20 MG PO TABS
20.0000 mg | ORAL_TABLET | Freq: Two times a day (BID) | ORAL | Status: DC
  Administered 2014-08-23 – 2014-08-27 (×9): 20 mg via ORAL
  Filled 2014-08-23 (×9): qty 1

## 2014-08-23 MED ORDER — SODIUM CHLORIDE 0.9 % IV SOLN: 1250.0000 mg | INTRAVENOUS | Status: DC

## 2014-08-23 MED ORDER — SODIUM CHLORIDE 0.9 % IV SOLN
INTRAVENOUS | Status: DC
  Administered 2014-08-23 – 2014-08-24 (×3): via INTRAVENOUS
  Filled 2014-08-23 (×5): qty 1000

## 2014-08-23 MED ORDER — SULFASALAZINE 500 MG PO TABS
1000.0000 mg | ORAL_TABLET | Freq: Four times a day (QID) | ORAL | Status: DC
  Administered 2014-08-23 – 2014-08-27 (×18): 1000 mg via ORAL
  Filled 2014-08-23 (×25): qty 2

## 2014-08-23 MED ORDER — VITAMIN B-1 100 MG PO TABS
100.0000 mg | ORAL_TABLET | Freq: Every day | ORAL | Status: DC
  Administered 2014-08-23 – 2014-08-27 (×5): 100 mg via ORAL
  Filled 2014-08-23 (×5): qty 1

## 2014-08-23 MED ORDER — SODIUM CHLORIDE 0.9 % IJ SOLN
3.0000 mL | Freq: Two times a day (BID) | INTRAMUSCULAR | Status: DC
Start: 2014-08-23 — End: 2014-08-27
  Administered 2014-08-23 – 2014-08-27 (×5): 3 mL via INTRAVENOUS

## 2014-08-23 MED ORDER — ADULT MULTIVITAMIN W/MINERALS CH
1.0000 | ORAL_TABLET | Freq: Every day | ORAL | Status: DC
  Administered 2014-08-23 – 2014-08-24 (×2): 1 via ORAL
  Filled 2014-08-23 (×2): qty 1

## 2014-08-23 MED ORDER — SODIUM CHLORIDE 0.9 % IV SOLN
INTRAVENOUS | Status: AC
  Administered 2014-08-23: 02:00:00 via INTRAVENOUS

## 2014-08-23 NOTE — Progress Notes (Signed)
PATIENT DETAILS Name: Robert Rhodes Age: 69 y.o. Sex: male Date of Birth: 06/15/1945 Admit Date: 08/22/2014 Admitting Physician Toy Baker, MD [id number], Gabrielle Dare, MD  Subjective: Diarrhea this am. Sleeping comfortably this am. Denied major complaints.  Assessment/Plan: Active Problems:   Left lower extremity cellulitis: Admitted and started on intravenous vancomycin. Still has some erythema in the left lower extremity. Afebrile, leukocytosis decreasing. Suspect significant contamination of skin by feces. Follow clinically.     Sepsis: Secondary to above. Continue vancomycin, follow blood cultures.     Diarrhea: Has history of ulcerative colitis. Stools appear very lose. Check C. difficile PCR.     History of ulcerative colitis: Continue sulfasalazine. Has diarrhea, not sure what his usual baseline is-. Recent sigmoidoscopy in January 2016 showed active proctitis and was started on Canasa suppository-?compliance. Check C. difficile PCR. Follow.    Colon cancer: Status post subtotal colectomy with either rectal anastomosis.    Psychosis: Unfortunately found by family in the house-covered with feces and urine, apparently has not had a shower for the past 1 week. Family filed IVC, patient was brought to the emergency room by the police department. Have consulted psychiatry. Currently awake him answers all questions appropriately.   Disposition: Remain inpatient-[facility] vs [facility] on discharge. Await Psych eval  Antibiotics:  See below    Anti-infectives    Start     Dose/Rate Route Frequency Ordered Stop   08/23/14 2300  vancomycin (VANCOCIN) 1,250 mg in sodium chloride 0.9 % 250 mL IVPB  Status:  Discontinued     1,250 mg 166.7 mL/hr over 90 Minutes Intravenous Every 24 hours 08/23/14 0204 08/23/14 1053   08/23/14 1200  vancomycin (VANCOCIN) IVPB 750 mg/150 ml premix     750 mg 150 mL/hr over 60 Minutes Intravenous Every 12 hours 08/23/14 1053     08/22/14  2300  vancomycin (VANCOCIN) IVPB 1000 mg/200 mL premix     1,000 mg 200 mL/hr over 60 Minutes Intravenous STAT 08/22/14 2253 08/23/14 0134      DVT Prophylaxis: Prophylactic Lovenox   Code Status: Full code  Family Communication None at bedside  Procedures:  None  CONSULTS:  None  Time spent 40 minutes-which includes 50% of the time with face-to-face with patient/ family and coordinating care related to the above assessment and plan.  MEDICATIONS: Scheduled Meds: . cholecalciferol  1,000 Units Oral Daily  . enoxaparin (LOVENOX) injection  40 mg Subcutaneous Q24H  . famotidine  20 mg Oral BID  . multivitamin with minerals  1 tablet Oral Daily  . sodium chloride  3 mL Intravenous Q12H  . sulfaSALAzine  1,000 mg Oral QID  . thiamine  100 mg Oral Daily  . vancomycin  750 mg Intravenous Q12H   Continuous Infusions:  PRN Meds:.HYDROcodone-acetaminophen    PHYSICAL EXAM: Vital signs in last 24 hours: Filed Vitals:   08/22/14 2323 08/22/14 2335 08/23/14 0154 08/23/14 0516  BP: 127/56  120/65 113/57  Pulse: 90  101 77  Temp:   98.8 F (37.1 C) 98.4 F (36.9 C)  TempSrc:   Oral Oral  Resp: 22   20  Height:  5' 8"  (1.727 m)    Weight:  68.811 kg (151 lb 11.2 oz)    SpO2: 99%  99% 100%    Weight change:  Filed Weights   08/22/14 2335  Weight: 68.811 kg (151 lb 11.2 oz)   Body mass index is 23.07 kg/(m^2).   Gen Exam: Awake and  alert. Appears disheveled. Sleeping when I walked in, awoke easily. Neck: Supple, No JVD.   Chest: B/L Clear CVS: S1 S2 Regular Abdomen: soft, BS +, non tender, non distended.  Extremities: large amount of feces-loose stools covering both lower extremities-RN was notified. Left lower extremity that appears somewhat erythematous without any major swelling. Leg did not appear tense.  Neurologic: Non Focal.   Skin: No Rash.   Wounds: N/A.   Intake/Output from previous day:  Intake/Output Summary (Last 24 hours) at 08/23/14 1247 Last  data filed at 08/23/14 0700  Gross per 24 hour  Intake 1421.67 ml  Output    700 ml  Net 721.67 ml     LAB RESULTS: CBC  Recent Labs Lab 08/22/14 2244 08/23/14 0427  WBC 14.6* 13.8*  HGB 14.3 12.6*  HCT 42.7 38.6*  PLT 368 334  MCV 86.3 86.2  MCH 28.9 28.1  MCHC 33.5 32.6  RDW 14.1 14.0    Chemistries   Recent Labs Lab 08/22/14 2244 08/23/14 0427  NA 137 135  K 3.0* 3.1*  CL 95* 98  CO2 28 29  GLUCOSE 101* 106*  BUN 20 16  CREATININE 1.22 1.07  CALCIUM 9.0 8.5  MG  --  2.5    CBG: No results for input(s): GLUCAP in the last 168 hours.  GFR Estimated Creatinine Clearance: 62.1 mL/min (by C-G formula based on Cr of 1.07).  Coagulation profile No results for input(s): INR, PROTIME in the last 168 hours.  Cardiac Enzymes No results for input(s): CKMB, TROPONINI, MYOGLOBIN in the last 168 hours.  Invalid input(s): CK  Invalid input(s): POCBNP No results for input(s): DDIMER in the last 72 hours. No results for input(s): HGBA1C in the last 72 hours. No results for input(s): CHOL, HDL, LDLCALC, TRIG, CHOLHDL, LDLDIRECT in the last 72 hours.  Recent Labs  08/23/14 0427  TSH 1.605   No results for input(s): VITAMINB12, FOLATE, FERRITIN, TIBC, IRON, RETICCTPCT in the last 72 hours. No results for input(s): LIPASE, AMYLASE in the last 72 hours.  Urine Studies No results for input(s): UHGB, CRYS in the last 72 hours.  Invalid input(s): UACOL, UAPR, USPG, UPH, UTP, UGL, UKET, UBIL, UNIT, UROB, ULEU, UEPI, UWBC, URBC, UBAC, CAST, UCOM, BILUA  MICROBIOLOGY: No results found for this or any previous visit (from the past 240 hour(s)).  RADIOLOGY STUDIES/RESULTS: No results found.  Oren Binet, MD  [facility] Pager:[phone]  If 7PM-7AM, please contact night-coverage [email or web address] Password [facility] 08/23/2014, 12:47 PM   LOS: 0 days

## 2014-08-23 NOTE — Progress Notes (Signed)
ANTIBIOTIC CONSULT NOTE - INITIAL  Pharmacy Consult for Vancomycin Indication: Cellulitis, sepsis  Allergies  Allergen Reactions  . Ace Inhibitors Other (See Comments)    Cant remember  . Advil [Ibuprofen] Other (See Comments)    Muscle tightness  . Aspirin Nausea And Vomiting  . Tylenol [Acetaminophen] Other (See Comments)    Muscle tightness    Patient Measurements: Height: 5' 8"  (172.7 cm) Weight: 151 lb 11.2 oz (68.811 kg) IBW/kg (Calculated) : 68.4  Vital Signs: Temp: 98.4 F (36.9 C) (03/12 0516) Temp Source: Oral (03/12 0516) BP: 113/57 mmHg (03/12 0516) Pulse Rate: 77 (03/12 0516) Intake/Output from previous day: 03/11 0701 - 03/12 0700 In: 1421.7 [P.O.:740; I.V.:681.7] Out: 700 [Urine:700] Intake/Output from this shift:    Labs:  Recent Labs  08/22/14 2244 08/23/14 0427  WBC 14.6* 13.8*  HGB 14.3 12.6*  PLT 368 334  CREATININE 1.22 1.07   Estimated Creatinine Clearance: 62.1 mL/min (by C-G formula based on Cr of 1.07). No results for input(s): VANCOTROUGH, VANCOPEAK, VANCORANDOM, GENTTROUGH, GENTPEAK, GENTRANDOM, TOBRATROUGH, TOBRAPEAK, TOBRARND, AMIKACINPEAK, AMIKACINTROU, AMIKACIN in the last 72 hours.   Microbiology: No results found for this or any previous visit (from the past 720 hour(s)).  Medical History: Past Medical History  Diagnosis Date  . Vitamin D deficiency   . UC (ulcerative colitis)   . Arthritis   . Osteoporosis   . GERD (gastroesophageal reflux disease)   . Colon cancer   . Eye abnormalities     right eye seen in ED 02/17/2014 wearing eye patch using eye drops  . Claustrophobia   . Adenomatous colon polyp     Medications:  Scheduled:  . cholecalciferol  1,000 Units Oral Daily  . enoxaparin (LOVENOX) injection  40 mg Subcutaneous Q24H  . famotidine  20 mg Oral BID  . multivitamin with minerals  1 tablet Oral Daily  . sodium chloride  3 mL Intravenous Q12H  . sulfaSALAzine  1,000 mg Oral QID  . thiamine  100 mg Oral  Daily  . vancomycin  750 mg Intravenous Q12H   Infusions:  . sodium chloride 100 mL/hr at 08/23/14 0211   Assessment:  69 yr male failure to thrive.  PMH includes ulcerative colitis and prostate cancer  Lower extremities = + cellulitis  Pharmacy consulted to dose Vancomycin for cellulitis  SCr improved 1.02, CrCl (CG) = 62 ml/min; CrCl (n) = 64 ml/min  Blood & urine culture ordered; CDiff panel ordered  Goal of Therapy:  Vancomycin trough level 15-20 mcg/ml  Plan:  Measure antibiotic drug levels at steady state Follow up culture results and renal function  Vancomycin 1gm IV x 1 given @ 00:10   Continue with Vancomycin 730m IV q12h  EPeggyann Juba PharmD, BCPS Pager: [phone] 31823/05/2015,10:54 AM

## 2014-08-23 NOTE — H&P (Signed)
PCP:     Angelica Chessman, MD  [facility] Leighton Ruff  Chief Complaint: Failure to thrive Family  filled out IVC  HPI: Robert Rhodes is a 69 y.o. male   has a past medical history of Vitamin D deficiency; UC (ulcerative colitis); Arthritis; Osteoporosis; GERD (gastroesophageal reflux disease); Colon cancer; Eye abnormalities; Claustrophobia; and Adenomatous colon polyp.   Patient was brought in from home family has filled out IVC paperwork as he has refused to care for himself. Patient was found in bed covered in feces and urine. Patient has refused to go to a doctor.   On physical exam he was noted to have swelling of his lower extremities significant for cellulitis. Patient reports that he was laying down on the floor in order to exercise demons.  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 Patient was found disheveled and a floor he has likely had very little by mouth intake for the past few weeks.   Altered note has history of ulcerative colitis he was also found to have colon cancer in January 2015 found to be adenocarcinoma status post subtotal colectomy in March 2015 he was followed by oncology and found to have no lymphocytic spread. Of note also in January 2015 he had the MRI done that showed atypical hepatic lesion is supposed to have this reevaluated with repeat MRI imaging. Patient has refused MRI in the past due to claustrophobia. An attempt was made to take him to an open MRI but this was unsuccessful as well.   Hospitalist was called for admission for cellulitis  Review of Systems: Unable to obtain due to patient's mental status Past Medical History: Past Medical History  Diagnosis Date  . Vitamin D deficiency   . UC (ulcerative colitis)   . Arthritis   . Osteoporosis   . GERD (gastroesophageal reflux disease)   . Colon cancer   . Eye abnormalities     right eye seen in ED 02/17/2014 wearing eye patch using eye drops  .  Claustrophobia   . Adenomatous colon polyp    Past Surgical History  Procedure Laterality Date  . Ventral hernia repair    . Eus N/A 07/18/2013    Procedure: UPPER ENDOSCOPIC ULTRASOUND (EUS) RADIAL;  Surgeon: Milus Banister, MD;  Location: [facility];  Service: Endoscopy;  Laterality: N/A;  . Intraocular lens insertion Bilateral     6 yrs ago  . Colon resection N/A 08/23/2013    Procedure: Laparoscopic total abdominal colectomy and hernia repair;  Surgeon: Leighton Ruff, MD;  Location: [facility];  Service: General;  Laterality: N/A;  . Colon surgery    . Eye surgery    . Hernia repair    . Incisional hernia repair N/A 02/20/2014    Procedure: LAP ASSISTED INCISIONAL HERNIA REPAIR LYSIS OF ADHESIONS;  Surgeon: Leighton Ruff, MD;  Location: [facility];  Service: General;  Laterality: N/A;  converted to open @ 0935   . Incisional hernia repair N/A 02/20/2014    Procedure: HERNIA REPAIR INCISIONAL;  Surgeon: Leighton Ruff, MD;  Location: [facility];  Service: General;  Laterality: N/A;  With MESH  . Colonoscopy       Medications: Prior to Admission medications   Medication Sig Start Date End Date Taking? Authorizing Provider  cholecalciferol (VITAMIN D) 1000 UNITS tablet Take 1,000 Units by mouth daily.   Yes Historical Provider, MD  famotidine (PEPCID) 20 MG tablet Take 1 tablet (20 mg total) by  mouth 2 (two) times daily. 01/14/14  Yes Tresa Garter, MD  sulfaSALAzine (AZULFIDINE) 500 MG tablet Take 2 tablets (1,000 mg total) by mouth 4 (four) times daily. 05/06/14  Yes Jessica D Zehr, PA-C  vitamin A 10000 UNIT capsule Take 10,000 Units by mouth daily.   Yes Historical Provider, MD  vitamin E 100 UNIT capsule Take 100 Units by mouth daily.   Yes Historical Provider, MD  AMBULATORY NON FORMULARY MEDICATION Take 1 tablet by mouth daily. Slippery elm    Historical Provider, MD  AMBULATORY NON FORMULARY MEDICATION Take 2 drops by mouth 2 (two) times daily. Peppermint Oil    Historical Provider, MD   AMBULATORY NON FORMULARY MEDICATION Take 15 mLs by mouth daily. Raspberry fiber blend    Historical Provider, MD  AMBULATORY NON FORMULARY MEDICATION Artichoke  Take 1 capsule by mouth daily    Historical Provider, MD  atropine 1 % ophthalmic solution Place 1 drop into the right eye 2 (two) times daily.    Historical Provider, MD  Budesonide 9 MG TB24 Take 9 mg by mouth daily. Patient not taking: Reported on 07/08/2014 05/06/14   Laban Emperor Zehr, PA-C  cyclobenzaprine (FLEXERIL) 5 MG tablet Take 1 tablet (5 mg total) by mouth every 4 (four) hours as needed for muscle spasms. Patient not taking: Reported on 6/64/4034 7/42/59   Leighton Ruff, MD  Difluprednate 0.05 % EMUL Apply 1 drop to eye daily. Every hour while awake    Historical Provider, MD  LORazepam (ATIVAN) 1 MG tablet Take one po 30 minutes to 1 hour prior to MRI Patient not taking: Reported on 08/22/2014 07/22/14   Jerene Bears, MD  mesalamine (CANASA) 1000 MG suppository Place 1 suppository (1,000 mg total) rectally at bedtime. Patient not taking: Reported on 08/22/2014 07/08/14   Jerene Bears, MD  oxyCODONE (OXY IR/ROXICODONE) 5 MG immediate release tablet Take 1-2 tablets (5-10 mg total) by mouth every 4 (four) hours as needed for moderate pain, severe pain or breakthrough pain. Patient not taking: Reported on 5/63/8756 4/33/29   Leighton Ruff, MD  Saw Palmetto 450 MG CAPS Take 2 capsules by mouth 2 (two) times daily.    Historical Provider, MD    Allergies:   Allergies  Allergen Reactions  . Ace Inhibitors Other (See Comments)    Cant remember  . Advil [Ibuprofen] Other (See Comments)    Muscle tightness  . Aspirin Nausea And Vomiting  . Tylenol [Acetaminophen] Other (See Comments)    Muscle tightness    Social History:  Ambulatory  independently   Lives at home alone,       reports that he quit smoking about 11 years ago. His smoking use included Cigarettes. He smoked 1.00 pack per day. He has never used smokeless  tobacco. He reports that he uses illicit drugs (Marijuana). He reports that he does not drink alcohol.    Family History: family history includes Cancer in his mother; Diabetes in his father; Heart attack in his brother; Heart disease in his father and sister. There is no history of Colon cancer, Esophageal cancer, Rectal cancer, or Stomach cancer.     Physical Exam: Patient Vitals for the past 24 hrs:  BP Temp Temp src Pulse Resp SpO2 Height Weight  08/22/14 2335 - - - - - - 5' 8"  (1.727 m) 68.811 kg (151 lb 11.2 oz)  08/22/14 2323 127/56 mmHg - - 90 22 99 % - -  08/22/14 2102 157/90 mmHg 98.7 F (37.1  C) Oral (!) 126 22 100 % - -    1. General:  in No Acute distress 2. Psychological: Alert and   Oriented  3. Head/ENT:    Dry Mucous Membranes                          Head Non traumatic, neck supple                            Poor Dentition 4. SKIN:   decreased Skin turgor,  Skin clean Dry, redness, excoriation, swelling 5. Heart: Regular rate and rhythm no Murmur, Rub or gallop 6. Lungs: Clear to auscultation bilaterally, no wheezes or crackles   7. Abdomen: Soft, non-tender, Non distended 8. Lower extremities: no clubbing, cyanosis, or edema 9. Neurologically Grossly intact, moving all 4 extremities equally 10. MSK: Normal range of motion  body mass index is 23.07 kg/(m^2).   Labs on Admission:   Results for orders placed or performed during the hospital encounter of 08/22/14 (from the past 24 hour(s))  CBC     Status: Abnormal   Collection Time: 08/22/14 10:44 PM  Result Value Ref Range   WBC 14.6 (H) 4.0 - 10.5 K/uL   RBC 4.95 4.22 - 5.81 MIL/uL   Hemoglobin 14.3 13.0 - 17.0 g/dL   HCT 42.7 39.0 - 52.0 %   MCV 86.3 78.0 - 100.0 fL   MCH 28.9 26.0 - 34.0 pg   MCHC 33.5 30.0 - 36.0 g/dL   RDW 14.1 11.5 - 15.5 %   Platelets 368 150 - 400 K/uL  Comprehensive metabolic panel     Status: Abnormal   Collection Time: 08/22/14 10:44 PM  Result Value Ref Range   Sodium 137  135 - 145 mmol/L   Potassium 3.0 (L) 3.5 - 5.1 mmol/L   Chloride 95 (L) 96 - 112 mmol/L   CO2 28 19 - 32 mmol/L   Glucose, Bld 101 (H) 70 - 99 mg/dL   BUN 20 6 - 23 mg/dL   Creatinine, Ser 1.22 0.50 - 1.35 mg/dL   Calcium 9.0 8.4 - 10.5 mg/dL   Total Protein 7.7 6.0 - 8.3 g/dL   Albumin 3.3 (L) 3.5 - 5.2 g/dL   AST 91 (H) 0 - 37 U/L   ALT 68 (H) 0 - 53 U/L   Alkaline Phosphatase 71 39 - 117 U/L   Total Bilirubin 1.1 0.3 - 1.2 mg/dL   GFR calc non Af Amer 58 (L) >90 mL/min   GFR calc Af Amer 68 (L) >90 mL/min   Anion gap 14 5 - 15  Ethanol     Status: None   Collection Time: 08/22/14 10:44 PM  Result Value Ref Range   Alcohol, Ethyl (B) <5 0 - 9 mg/dL  I-stat troponin, ED     Status: None   Collection Time: 08/22/14 11:03 PM  Result Value Ref Range   Troponin i, poc 0.02 0.00 - 0.08 ng/mL   Comment 3          Urinalysis, Routine w reflex microscopic     Status: Abnormal   Collection Time: 08/22/14 11:22 PM  Result Value Ref Range   Color, Urine AMBER (A) YELLOW   APPearance CLEAR CLEAR   Specific Gravity, Urine 1.023 1.005 - 1.030   pH 6.0 5.0 - 8.0   Glucose, UA NEGATIVE NEGATIVE mg/dL   Hgb urine dipstick MODERATE (A) NEGATIVE  Bilirubin Urine NEGATIVE NEGATIVE   Ketones, ur NEGATIVE NEGATIVE mg/dL   Protein, ur 30 (A) NEGATIVE mg/dL   Urobilinogen, UA 0.2 0.0 - 1.0 mg/dL   Nitrite NEGATIVE NEGATIVE   Leukocytes, UA NEGATIVE NEGATIVE  Drug screen panel, emergency     Status: Abnormal   Collection Time: 08/22/14 11:22 PM  Result Value Ref Range   Opiates NONE DETECTED NONE DETECTED   Cocaine NONE DETECTED NONE DETECTED   Benzodiazepines NONE DETECTED NONE DETECTED   Amphetamines NONE DETECTED NONE DETECTED   Tetrahydrocannabinol POSITIVE (A) NONE DETECTED   Barbiturates NONE DETECTED NONE DETECTED  Urine microscopic-add on     Status: Abnormal   Collection Time: 08/22/14 11:22 PM  Result Value Ref Range   Squamous Epithelial / LPF RARE RARE   RBC / HPF 3-6 <3  RBC/hpf   Bacteria, UA FEW (A) RARE   Casts HYALINE CASTS (A) NEGATIVE    UA no evidence of UTI  No results found for: HGBA1C  Estimated Creatinine Clearance: 54.5 mL/min (by C-G formula based on Cr of 1.22).  BNP (last 3 results) No results for input(s): PROBNP in the last 8760 hours.     Filed Weights   08/22/14 2335  Weight: 68.811 kg (151 lb 11.2 oz)    Cultures: No results found for: SDES, SPECREQUEST, CULT, REPTSTATUS   Radiological Exams on Admission: No results found.  Chart has been reviewed  Assessment/Plan  69 yo M here with medical noncompliance was found to have cellulitis and failure to thrive  Present on Admission:  . Colon cancer - patient was in a process of workup for liver lesion. May be able to obtain MRI while hospitalized.  . Cellulitis - given leukocytosis and tachycardia meets sepsis criteria. We'll admit to telemetry treat with vancomycin  . dehydration - administrating fluids Failure to thrive - patient reports that he willingly stayed on the floor for prolonged period of time trying to train his body and exercise the demons out of his house. She has very elaborate belief system. He appears to be alert and oriented.  It is unclear if patient has breakthrough reality. Would recommend psychiatric consult in the morning to try to clarify his state of mind.   Prophylaxis: SCD , Protonix  CODE STATUS:  presumed to be FULL CODE   Other plan as per orders.  I have spent a total of 55 min on this admission  Azie Mcconahy 08/23/2014, 12:46 AM  [facility]  Pager [phone]   after 2 AM please page floor coverage PA If 7AM-7PM, please contact the day team taking care of the patient  [email or web address]  Password [email or web address]

## 2014-08-23 NOTE — Consult Note (Signed)
[facility] Consult   Reason for Consult:  Psychosis, capacity Referring Physician:  Dr. Sloan Leiter Patient Identification: Robert Rhodes MRN:  [id number] Principal Diagnosis: <principal problem not specified> Diagnosis:   Patient Active Problem List   Diagnosis Date Noted  . Cellulitis [L03.90] 08/23/2014  . Medically noncompliant [Z91.19] 08/23/2014  . Left leg cellulitis [H41.937] 08/23/2014  . Cellulitis of left leg [L03.116]   . Hypokalemia [E87.6]   . Diarrhea [R19.7] 05/06/2014  . Ulcerative colitis [K51.90] 04/17/2014  . Verrucous keratosis [L98.8] 04/17/2014  . Nonspecific (abnormal) findings on radiological and other examination of gastrointestinal tract [R93.3] 07/18/2013  . Acute esophagitis [K20.9] 07/18/2013  . Colon cancer [C18.9] 07/16/2013  . Hypovitaminosis D [E55.9] 05/06/2013  . Ulcerative colitis, unspecified [K51.90] 04/04/2013  . Knee pain, bilateral [M25.561, M25.562] 03/05/2013    Total Time spent with patient: 30 minutes  Subjective:   Robert Rhodes is a 69 y.o. male patient admitted with cellulitis and confusion  HPI:  This patient is a 69 year old black male who lives with his niece and his brother in [place]. He was cemented under involuntary petition filed by his family because he refused to take care of himself. He was in bed covered in feces and urine and refused to go to a doctor. He has been diagnosed with cellulitis and sepsis and is currently receiving IV antibiotics. He was found disheveled on the floor at home and was not eating  or sleeping.  The patient has a history of ulcerative colitis and had colon cancer in January 2015 found to be adenocarcinoma and had a subtotal colectomy in March 2015. In January 2015 he had an MRI that showed atypical hepatic lesion is supposed to have this reevaluated but he has been refusing the MRI. He tells me today however that he is going to be having it on March 17.  On interview the patient  tends to ramble and is focusing on "his spiritual life" I attempted to contact his brother and niece and they state that he is Rastafarian and these concepts are part of his religious views. The niece indicates that he was committed to some sort of psychiatric facility in 2012 in [place] but doesn't know anything more about it. He denies auditory or visual hallucinations but seems somewhat disorganized in his thinking. He denies suicidal or homicidal ideation. He admits that he is a vegetarian only eats one meal a day. According to the notes he seems more organized and coherent than he did on the first day and much of his confusion may have been secondary to the sepsis. We can continue to keep a close eye on him and perhaps lute is a low-dose antipsychotic at bedtime. He denies the use of drugs or alcohol but used to smoke marijuana up until about 15 years ago. HPI Elements:   Location:  Global. Quality:  Severe. Severity:  Severe. Timing:  2 months. Duration:  Acute. Context:  History of colon cancer.  Past Medical History:  Past Medical History  Diagnosis Date  . Vitamin D deficiency   . UC (ulcerative colitis)   . Arthritis   . Osteoporosis   . GERD (gastroesophageal reflux disease)   . Colon cancer   . Eye abnormalities     right eye seen in ED 02/17/2014 wearing eye patch using eye drops  . Claustrophobia   . Adenomatous colon polyp     Past Surgical History  Procedure Laterality Date  . Ventral hernia repair    . Eus  N/A 07/18/2013    Procedure: UPPER ENDOSCOPIC ULTRASOUND (EUS) RADIAL;  Surgeon: Milus Banister, MD;  [facility]: [facility];  Service: Endoscopy;  Laterality: N/A;  . Intraocular lens insertion Bilateral     6 yrs ago  . Colon resection N/A 08/23/2013    Procedure: Laparoscopic total abdominal colectomy and hernia repair;  Surgeon: Leighton Ruff, MD;  Location: [facility];  Service: General;  Laterality: N/A;  . Colon surgery    . Eye surgery    . Hernia repair    .  Incisional hernia repair N/A 02/20/2014    Procedure: LAP ASSISTED INCISIONAL HERNIA REPAIR LYSIS OF ADHESIONS;  Surgeon: Leighton Ruff, MD;  Location: [facility];  Service: General;  Laterality: N/A;  converted to open @ 0935   . Incisional hernia repair N/A 02/20/2014    Procedure: HERNIA REPAIR INCISIONAL;  Surgeon: Leighton Ruff, MD;  Location: [facility];  Service: General;  Laterality: N/A;  With MESH  . Colonoscopy     Family History:  Family History  Problem Relation Age of Onset  . Cancer Mother     type unknown  . Diabetes Father   . Heart disease Father   . Heart disease Sister   . Heart attack Brother   . Colon cancer Neg Hx   . Esophageal cancer Neg Hx   . Rectal cancer Neg Hx   . Stomach cancer Neg Hx    Social History:  History  Alcohol Use No     History  Drug Use  . Yes  . Special: Marijuana    Comment: quit in 2005    History   Social History  . Marital Status: Single    Spouse Name: N/A  . Number of Children: 0  . Years of Education: N/A   Occupational History  . retired    Social History Main Topics  . Smoking status: Former Smoker -- 1.00 packs/day    Types: Cigarettes    Quit date: 06/14/2003  . Smokeless tobacco: Never Used     Comment: quit in July 2005  . Alcohol Use: No  . Drug Use: Yes    Special: Marijuana     Comment: quit in 2005  . Sexual Activity: Yes   Other Topics Concern  . None   Social History Narrative   Additional Social History:                          Allergies:   Allergies  Allergen Reactions  . Ace Inhibitors Other (See Comments)    Cant remember  . Advil [Ibuprofen] Other (See Comments)    Muscle tightness  . Aspirin Nausea And Vomiting  . Tylenol [Acetaminophen] Other (See Comments)    Muscle tightness    Vitals: Blood pressure 113/57, pulse 77, temperature 98.4 F (36.9 C), temperature source Oral, resp. rate 20, height 5' 8"  (1.727 m), weight 151 lb 11.2 oz (68.811 kg), SpO2 100 %.  Risk to  Self: Is patient at risk for suicide?: No Risk to Others:   Prior Inpatient Therapy:   Prior Outpatient Therapy:    Current Facility-Administered Medications  Medication Dose Route Frequency Provider Last Rate Last Dose  . 0.9 %  sodium chloride infusion   Intravenous Continuous Jonetta Osgood, MD 75 mL/hr at 08/23/14 1259 75 mL/hr at 08/23/14 1259  . cholecalciferol (VITAMIN D) tablet 1,000 Units  1,000 Units Oral Daily Toy Baker, MD   1,000 Units at 08/23/14 0933  .  enoxaparin (LOVENOX) injection 40 mg  40 mg Subcutaneous Q24H Toy Baker, MD   40 mg at 08/23/14 0933  . famotidine (PEPCID) tablet 20 mg  20 mg Oral BID Toy Baker, MD   20 mg at 08/23/14 0933  . HYDROcodone-acetaminophen (NORCO/VICODIN) 5-325 MG per tablet 1-2 tablet  1-2 tablet Oral Q4H PRN Toy Baker, MD      . multivitamin with minerals tablet 1 tablet  1 tablet Oral Daily Toy Baker, MD   1 tablet at 08/23/14 0933  . sodium chloride 0.9 % injection 3 mL  3 mL Intravenous Q12H Toy Baker, MD   3 mL at 08/23/14 0215  . sulfaSALAzine (AZULFIDINE) tablet 1,000 mg  1,000 mg Oral QID Toy Baker, MD   1,000 mg at 08/23/14 1231  . thiamine (VITAMIN B-1) tablet 100 mg  100 mg Oral Daily Toy Baker, MD   100 mg at 08/23/14 0933  . vancomycin (VANCOCIN) IVPB 750 mg/150 ml premix  750 mg Intravenous Q12H Emiliano Dyer, RPH   750 mg at 08/23/14 1230    Musculoskeletal: Strength & Muscle Tone: within normal limits Gait & Station: normal Patient leans: N/A  Psychiatric Specialty Exam: Physical Exam  Psychiatric: He has a normal mood and affect. His speech is normal and behavior is normal. Thought content is delusional. Cognition and memory are impaired. He expresses inappropriate judgment.    Review of Systems  Constitutional: Positive for weight loss.  Neurological: Positive for weakness.    Blood pressure 113/57, pulse 77, temperature 98.4 F (36.9 C),  temperature source Oral, resp. rate 20, height 5' 8"  (1.727 m), weight 151 lb 11.2 oz (68.811 kg), SpO2 100 %.Body mass index is 23.07 kg/(m^2).  General Appearance: Bizarre and Casual  Eye Contact::  Fair  Speech:  Garbled  Volume:  Decreased  Mood:  Irritable  Affect:  Inappropriate  Thought Process:  Circumstantial and Loose  Orientation:  Full (Time, Place, and Person)  Thought Content:  Obsessions and Rumination  Suicidal Thoughts:  No  Homicidal Thoughts:  No  Memory:  Immediate;   Fair Recent;   Fair Remote;   Fair  Judgement:  Impaired  Insight:  Lacking  Psychomotor Activity:  Decreased  Concentration:  Fair  Recall:  [employer] of Knowledge:Fair  Language: Fair  Akathisia:  No  Handed:  Right  AIMS (if indicated):     Assets:  Communication Skills Desire for Improvement Social Support  ADL's:  Impaired  Cognition: WNL  Sleep:      Medical Decision Making: New problem, with additional work up planned, Review of Psycho-Social Stressors (1), Decision to obtain old records (1) and Review and summation of old records (2)  Treatment Plan Summary: Daily contact with patient to assess and evaluate symptoms and progress in treatment and Medication management  Plan:  Recommend psychiatric Inpatient admission when medically cleared. Disposition: We'll start low-dose Zyprexa at bedtime. His cognitive status seems to be improving as his sepsis is being treated. We'll reevaluate tomorrow  Harrington Challenger, [facility] 08/23/2014 1:58 PM

## 2014-08-23 NOTE — ED Notes (Signed)
Hospitalist at bedside 

## 2014-08-23 NOTE — Progress Notes (Signed)
ANTIBIOTIC CONSULT NOTE - INITIAL  Pharmacy Consult for Vancomycin Indication: Cellulitis  Allergies  Allergen Reactions  . Ace Inhibitors Other (See Comments)    Cant remember  . Advil [Ibuprofen] Other (See Comments)    Muscle tightness  . Aspirin Nausea And Vomiting  . Tylenol [Acetaminophen] Other (See Comments)    Muscle tightness    Patient Measurements: Height: 5' 8"  (172.7 cm) Weight: 151 lb 11.2 oz (68.811 kg) IBW/kg (Calculated) : 68.4  Vital Signs: Temp: 98.8 F (37.1 C) (03/12 0154) Temp Source: Oral (03/12 0154) BP: 120/65 mmHg (03/12 0154) Pulse Rate: 101 (03/12 0154) Intake/Output from previous day: 03/11 0701 - 03/12 0700 In: 700 [P.O.:500; I.V.:200] Out: 700 [Urine:700] Intake/Output from this shift: Total I/O In: 700 [P.O.:500; I.V.:200] Out: 700 [Urine:700]  Labs:  Recent Labs  08/22/14 2244  WBC 14.6*  HGB 14.3  PLT 368  CREATININE 1.22   Estimated Creatinine Clearance: 54.5 mL/min (by C-G formula based on Cr of 1.22). No results for input(s): VANCOTROUGH, VANCOPEAK, VANCORANDOM, GENTTROUGH, GENTPEAK, GENTRANDOM, TOBRATROUGH, TOBRAPEAK, TOBRARND, AMIKACINPEAK, AMIKACINTROU, AMIKACIN in the last 72 hours.   Microbiology: No results found for this or any previous visit (from the past 720 hour(s)).  Medical History: Past Medical History  Diagnosis Date  . Vitamin D deficiency   . UC (ulcerative colitis)   . Arthritis   . Osteoporosis   . GERD (gastroesophageal reflux disease)   . Colon cancer   . Eye abnormalities     right eye seen in ED 02/17/2014 wearing eye patch using eye drops  . Claustrophobia   . Adenomatous colon polyp     Medications:  Scheduled:  . potassium chloride  10 mEq Intravenous Once   Infusions:   Assessment:  69 yr male failure to thrive.  PMH includes ulcerative colitis and prostate cancer  Lower extremities = + cellulitis  Pharmacy consulted to dose Vancomycin for cellulitis  CrCl (CG) = 54 ml/min;  CrCl (n) = 57 ml/min  Blood & urine culture ordered; CDiff panel ordered  Goal of Therapy:  Vancomycin trough level 10-15 mcg/ml  Plan:  Measure antibiotic drug levels at steady state Follow up culture results   Vancomycin 1gm IV x 1 given @ 00:10 (weight unknown at the time)  Continue with Vancomycin 1246m IV q24h  Renate Danh, LToribio Harbour PharmD 08/23/2014,1:59 AM

## 2014-08-24 DIAGNOSIS — R197 Diarrhea, unspecified: Secondary | ICD-10-CM

## 2014-08-24 DIAGNOSIS — F23 Brief psychotic disorder: Secondary | ICD-10-CM

## 2014-08-24 DIAGNOSIS — E43 Unspecified severe protein-calorie malnutrition: Secondary | ICD-10-CM | POA: Insufficient documentation

## 2014-08-24 LAB — BASIC METABOLIC PANEL
Anion gap: 9 (ref 5–15)
BUN: 11 mg/dL (ref 6–23)
CALCIUM: 8.4 mg/dL (ref 8.4–10.5)
CHLORIDE: 104 mmol/L (ref 96–112)
CO2: 28 mmol/L (ref 19–32)
Creatinine, Ser: 0.87 mg/dL (ref 0.50–1.35)
GFR calc non Af Amer: 85 mL/min — ABNORMAL LOW (ref >90)
Glucose, Bld: 95 mg/dL (ref 70–99)
Potassium: 3.2 mmol/L — ABNORMAL LOW (ref 3.5–5.1)
Sodium: 141 mmol/L (ref 135–145)

## 2014-08-24 LAB — URINE CULTURE
COLONY COUNT: NO GROWTH
Culture: NO GROWTH

## 2014-08-24 LAB — CBC
HCT: 36.1 % — ABNORMAL LOW (ref 39.0–52.0)
Hemoglobin: 11.6 g/dL — ABNORMAL LOW (ref 13.0–17.0)
MCH: 28 pg (ref 26.0–34.0)
MCHC: 32.1 g/dL (ref 30.0–36.0)
MCV: 87 fL (ref 78.0–100.0)
Platelets: 366 10*3/uL (ref 150–400)
RBC: 4.15 MIL/uL — AB (ref 4.22–5.81)
RDW: 14.5 % (ref 11.5–15.5)
WBC: 9.3 10*3/uL (ref 4.0–10.5)

## 2014-08-24 LAB — HIV ANTIBODY (ROUTINE TESTING W REFLEX): HIV Screen 4th Generation wRfx: NONREACTIVE

## 2014-08-24 MED ORDER — ADULT MULTIVITAMIN W/MINERALS CH
1.0000 | ORAL_TABLET | Freq: Every day | ORAL | Status: DC
  Administered 2014-08-25 – 2014-08-27 (×3): 1 via ORAL
  Filled 2014-08-24 (×3): qty 1

## 2014-08-24 MED ORDER — BOOST / RESOURCE BREEZE PO LIQD
1.0000 | Freq: Three times a day (TID) | ORAL | Status: DC
  Administered 2014-08-24 – 2014-08-27 (×8): 1 via ORAL

## 2014-08-24 MED ORDER — MESALAMINE 1000 MG RE SUPP
1000.0000 mg | Freq: Every day | RECTAL | Status: DC
  Administered 2014-08-25 – 2014-08-26 (×2): 1000 mg via RECTAL
  Filled 2014-08-24 (×4): qty 1

## 2014-08-24 MED ORDER — POTASSIUM CHLORIDE CRYS ER 20 MEQ PO TBCR
40.0000 meq | EXTENDED_RELEASE_TABLET | Freq: Once | ORAL | Status: AC
  Administered 2014-08-24: 40 meq via ORAL
  Filled 2014-08-24: qty 2

## 2014-08-24 MED ORDER — DOXYCYCLINE HYCLATE 100 MG PO TABS
100.0000 mg | ORAL_TABLET | Freq: Two times a day (BID) | ORAL | Status: DC
  Administered 2014-08-24 – 2014-08-25 (×3): 100 mg via ORAL
  Filled 2014-08-24 (×3): qty 1

## 2014-08-24 NOTE — Progress Notes (Signed)
Patient is alert and oriented to himself, place and time, but when RN first went in to assess the patient, patient stood up and starting dancing with his eyes closed. When asked what he was doing, he stated he was performing an exorcism. Then he went into the bathroom and had a watery bowel movement all over the floor. The RN and NT asked him to sit on the toilet, but he refused to do so wanting to stand up and go all over the floor. When the RN told him his was pooping all over the floor, he stated that he was not pooping, and that it was oils coming out of him and he didn't understand why.

## 2014-08-24 NOTE — Evaluation (Signed)
Physical Therapy Evaluation Patient Details Name: Dmitriy Gair MRN: [id number] DOB: Jul 24, 1945 Today's Date: 08/24/2014   History of Present Illness  69 y.o. male with past medical history of Vitamin D deficiency; UC (ulcerative colitis); Arthritis; Osteoporosis; GERD (gastroesophageal reflux disease); Colon cancer; Eye abnormalities; Claustrophobia; and Adenomatous colon polyp admitted by family due to pt being found lying in urine and feces stating he was on floor performing exorcisms.   Clinical Impression  Pt is independent with bed mobility, transfers, and ambulation. He walked 400' without an assistive device, no loss of balance, he does have a wide base of support when walking. No further PT indicated.     Follow Up Recommendations No PT follow up    Equipment Recommendations  None recommended by PT    Recommendations for Other Services       Precautions / Restrictions Precautions Precautions: None Precaution Comments: pt denies falls Restrictions Weight Bearing Restrictions: No      Mobility  Bed Mobility Overal bed mobility: Independent                Transfers Overall transfer level: Independent                  Ambulation/Gait Ambulation/Gait assistance: Independent Ambulation Distance (Feet): 400 Feet Assistive device: None Gait Pattern/deviations: Wide base of support   Gait velocity interpretation: at or above normal speed for age/gender General Gait Details: steady without assistive device, no LOB  Stairs            Wheelchair Mobility    Modified Rankin (Stroke Patients Only)       Balance Overall balance assessment: No apparent balance deficits (not formally assessed)                                           Pertinent Vitals/Pain Pain Assessment: No/denies pain    Home Living Family/patient expects to be discharged to:: Private residence Living Arrangements: Other relatives (niece) Available Help  at Discharge: Family;Available PRN/intermittently Type of Home: House Home Access: Level entry     Home Layout: One level Home Equipment: Cane - single point      Prior Function Level of Independence: Independent with assistive device(s)         Comments: uses SPC as needed     Hand Dominance        Extremity/Trunk Assessment   Upper Extremity Assessment: Overall WFL for tasks assessed           Lower Extremity Assessment: Overall WFL for tasks assessed      Cervical / Trunk Assessment: Normal  Communication   Communication: No difficulties  Cognition Arousal/Alertness: Awake/alert Behavior During Therapy: WFL for tasks assessed/performed Overall Cognitive Status: Within Functional Limits for tasks assessed                      General Comments      Exercises        Assessment/Plan    PT Assessment Patent does not need any further PT services  PT Diagnosis     PT Problem List    PT Treatment Interventions     PT Goals (Current goals can be found in the Care Plan section) Acute Rehab PT Goals Patient Stated Goal: pt likes to meditate, stated he is a spiritualist    Frequency     Barriers to discharge  Co-evaluation               End of Session Equipment Utilized During Treatment: Gait belt Activity Tolerance: Patient tolerated treatment well Patient left: in chair;with call bell/phone within reach;with chair alarm set Nurse Communication: Mobility status         Time: 2419-9144 PT Time Calculation (min) (ACUTE ONLY): 20 min   Charges:   PT Evaluation $Initial PT Evaluation Tier I: 1 Procedure     PT G Codes:        Philomena Doheny 08/24/2014, 9:33 AM [phone]

## 2014-08-24 NOTE — Consult Note (Signed)
[facility] Consult   Reason for Consult:  Psychosis, capacity Referring Physician:  Dr. Sloan Leiter Patient Identification: Robert Rhodes MRN:  [id number] Principal Diagnosis: <principal problem not specified> Diagnosis:   Patient Active Problem List   Diagnosis Date Noted  . Cellulitis [L03.90] 08/23/2014  . Medically noncompliant [Z91.19] 08/23/2014  . Left leg cellulitis [F68.127] 08/23/2014  . Cellulitis of left leg [L03.116]   . Hypokalemia [E87.6]   . Diarrhea [R19.7] 05/06/2014  . Ulcerative colitis [K51.90] 04/17/2014  . Verrucous keratosis [L98.8] 04/17/2014  . Nonspecific (abnormal) findings on radiological and other examination of gastrointestinal tract [R93.3] 07/18/2013  . Acute esophagitis [K20.9] 07/18/2013  . Colon cancer [C18.9] 07/16/2013  . Hypovitaminosis D [E55.9] 05/06/2013  . Ulcerative colitis, unspecified [K51.90] 04/04/2013  . Knee pain, bilateral [M25.561, M25.562] 03/05/2013    Total Time spent with patient: 30 minutes  Subjective:   Robert Rhodes is a 69 y.o. male patient admitted with cellulitis and confusion  HPI:  This patient is a 69 year old black male who lives with his niece and his brother in [place]. He was admitted under involuntary petition filed by his family because he refused to take care of himself. He was in bed covered in feces and urine and refused to go to a doctor. He has been diagnosed with cellulitis and sepsis and is currently receiving IV antibiotics. He was found disheveled on the floor at home and was not eating  or sleeping.  The patient has a history of ulcerative colitis and had colon cancer in January 2015 found to be adenocarcinoma and had a subtotal colectomy in March 2015. In January 2015 he had an MRI that showed atypical hepatic lesion is supposed to have this reevaluated but he has been refusing the MRI. He tells me today however that he is going to be having it on March 17.  On interview the  patient tends to ramble and is focusing on "his spiritual life" I attempted to contact his brother and niece and they state that he is Rastafarian and these concepts are part of his religious views. The niece indicates that he was committed to some sort of psychiatric facility in 2012 in [place] but doesn't know anything more about it. He denies auditory or visual hallucinations but seems somewhat disorganized in his thinking. He denies suicidal or homicidal ideation. He admits that he is a vegetarian only eats one meal a day. According to the notes he seems more organized and coherent than he did on the first day and much of his confusion may have been secondary to the sepsis. We can continue to keep a close eye on him and perhaps use a low-dose antipsychotic at bedtime. He denies the use of drugs or alcohol but used to smoke marijuana up until about 15 years ago.  Patient seen again today, 08/24/14. He seems more coherent and alert. He claims that he is eating well and sleeping well. Zyprexa was started yesterday. He has many fixed ideas about how to live which include fasting at times as part of his religion. I doubt that these will change. He denies thoughts of suicide or harm to others or auditory or visual ha;;ucinations HPI Elements:   Location:  Global. Quality:  Severe. Severity:  Severe. Timing:  2 months. Duration:  Acute. Context:  History of colon cancer.  Past Medical History:  Past Medical History  Diagnosis Date  . Vitamin D deficiency   . UC (ulcerative colitis)   . Arthritis   .  Osteoporosis   . GERD (gastroesophageal reflux disease)   . Colon cancer   . Eye abnormalities     right eye seen in ED 02/17/2014 wearing eye patch using eye drops  . Claustrophobia   . Adenomatous colon polyp     Past Surgical History  Procedure Laterality Date  . Ventral hernia repair    . Eus N/A 07/18/2013    Procedure: UPPER ENDOSCOPIC ULTRASOUND (EUS) RADIAL;  Surgeon: Milus Banister, MD;   Location: [facility];  Service: Endoscopy;  Laterality: N/A;  . Intraocular lens insertion Bilateral     6 yrs ago  . Colon resection N/A 08/23/2013    Procedure: Laparoscopic total abdominal colectomy and hernia repair;  Surgeon: Leighton Ruff, MD;  Location: [facility];  Service: General;  Laterality: N/A;  . Colon surgery    . Eye surgery    . Hernia repair    . Incisional hernia repair N/A 02/20/2014    Procedure: LAP ASSISTED INCISIONAL HERNIA REPAIR LYSIS OF ADHESIONS;  Surgeon: Leighton Ruff, MD;  Location: [facility];  Service: General;  Laterality: N/A;  converted to open @ 0935   . Incisional hernia repair N/A 02/20/2014    Procedure: HERNIA REPAIR INCISIONAL;  Surgeon: Leighton Ruff, MD;  Location: [facility];  Service: General;  Laterality: N/A;  With MESH  . Colonoscopy     Family History:  Family History  Problem Relation Age of Onset  . Cancer Mother     type unknown  . Diabetes Father   . Heart disease Father   . Heart disease Sister   . Heart attack Brother   . Colon cancer Neg Hx   . Esophageal cancer Neg Hx   . Rectal cancer Neg Hx   . Stomach cancer Neg Hx    Social History:  History  Alcohol Use No     History  Drug Use  . Yes  . Special: Marijuana    Comment: quit in 2005    History   Social History  . Marital Status: Single    Spouse Name: N/A  . Number of Children: 0  . Years of Education: N/A   Occupational History  . retired    Social History Main Topics  . Smoking status: Former Smoker -- 1.00 packs/day    Types: Cigarettes    Quit date: 06/14/2003  . Smokeless tobacco: Never Used     Comment: quit in July 2005  . Alcohol Use: No  . Drug Use: Yes    Special: Marijuana     Comment: quit in 2005  . Sexual Activity: Yes   Other Topics Concern  . None   Social History Narrative   Additional Social History:                          Allergies:   Allergies  Allergen Reactions  . Ace Inhibitors Other (See Comments)    Cant  remember  . Advil [Ibuprofen] Other (See Comments)    Muscle tightness  . Aspirin Nausea And Vomiting  . Tylenol [Acetaminophen] Other (See Comments)    Muscle tightness    Vitals: Blood pressure 124/62, pulse 90, temperature 98.3 F (36.8 C), temperature source Oral, resp. rate 20, height 5' 8"  (1.727 m), weight 151 lb 11.2 oz (68.811 kg), SpO2 100 %.  Risk to Self: Is patient at risk for suicide?: No Risk to Others:   Prior Inpatient Therapy:   Prior Outpatient Therapy:  Current Facility-Administered Medications  Medication Dose Route Frequency Provider Last Rate Last Dose  . cholecalciferol (VITAMIN D) tablet 1,000 Units  1,000 Units Oral Daily Toy Baker, MD   1,000 Units at 08/24/14 1056  . enoxaparin (LOVENOX) injection 40 mg  40 mg Subcutaneous Q24H Toy Baker, MD   40 mg at 08/23/14 0933  . famotidine (PEPCID) tablet 20 mg  20 mg Oral BID Toy Baker, MD   20 mg at 08/24/14 1056  . feeding supplement (RESOURCE BREEZE) (RESOURCE BREEZE) liquid 1 Container  1 Container Oral TID BM Dorann Ou, RD      . HYDROcodone-acetaminophen (NORCO/VICODIN) 5-325 MG per tablet 1-2 tablet  1-2 tablet Oral Q4H PRN Toy Baker, MD   1 tablet at 08/24/14 0140  . mesalamine (CANASA) suppository 1,000 mg  1,000 mg Rectal QHS Jonetta Osgood, MD      . multivitamin with minerals tablet 1 tablet  1 tablet Oral Daily Toy Baker, MD   1 tablet at 08/24/14 1056  . OLANZapine (ZYPREXA) tablet 5 mg  5 mg Oral QHS Cloria Spring, MD   5 mg at 08/23/14 2202  . sodium chloride 0.9 % 1,000 mL with potassium chloride 20 mEq infusion   Intravenous Continuous Jonetta Osgood, MD 75 mL/hr at 08/24/14 1203    . sodium chloride 0.9 % injection 3 mL  3 mL Intravenous Q12H Toy Baker, MD   3 mL at 08/23/14 2202  . sulfaSALAzine (AZULFIDINE) tablet 1,000 mg  1,000 mg Oral QID Toy Baker, MD   1,000 mg at 08/24/14 1204  . thiamine (VITAMIN B-1) tablet 100  mg  100 mg Oral Daily Toy Baker, MD   100 mg at 08/24/14 1056  . vancomycin (VANCOCIN) IVPB 750 mg/150 ml premix  750 mg Intravenous Q12H Emiliano Dyer, RPH   750 mg at 08/24/14 1203    Musculoskeletal: Strength & Muscle Tone: within normal limits Gait & Station: normal Patient leans: N/A  Psychiatric Specialty Exam: Physical Exam  Psychiatric: He has a normal mood and affect. His speech is normal and behavior is normal. Thought content is delusional. Cognition and memory are impaired. He expresses inappropriate judgment.    ROS  Blood pressure 124/62, pulse 90, temperature 98.3 F (36.8 C), temperature source Oral, resp. rate 20, height 5' 8"  (1.727 m), weight 151 lb 11.2 oz (68.811 kg), SpO2 100 %.Body mass index is 23.07 kg/(m^2).  General Appearance: Bizarre and Casual  Eye Contact::  Fair  Speech:  Garbled  Volume:  Decreased  Mood:improved  Affect: brighter  Thought Process:  Circumstantial and Loose  Orientation:  Full (Time, Place, and Person)  Thought Content:  Obsessions and Rumination  Suicidal Thoughts:  No  Homicidal Thoughts:  No  Memory:  Immediate;   Fair Recent;   Fair Remote;   Fair  Judgement:  Impaired  Insight:  Lacking  Psychomotor Activity: better  Concentration:  Fair  Recall:  [employer] of Knowledge:Fair  Language: Fair  Akathisia:  No  Handed:  Right  AIMS (if indicated):     Assets:  Communication Skills Desire for Improvement Social Support  ADL's:  Impaired  Cognition: WNL  Sleep:      Medical Decision Making: New problem, with additional work up planned, Review of Psycho-Social Stressors (1), Decision to obtain old records (1) and Review and summation of old records (2)  Treatment Plan Summary: Daily contact with patient to assess and evaluate symptoms and progress in treatment and  Medication management  Plan:  Recommend psychiatric Inpatient admission when medically cleared. Disposition: We'll continue low-dose Zyprexa  at bedtime. His cognitive status seems to be improving as his sepsis is being treated. Will ask psych consultant to follow tomorrow  Harrington Challenger [facility] 08/24/2014 2:47 PM

## 2014-08-24 NOTE — Progress Notes (Signed)
PATIENT DETAILS Name: Robert Rhodes Age: 69 y.o. Sex: male Date of Birth: Jun 01, 1946 Admit Date: 08/22/2014 Admitting Physician Toy Baker, MD UDT:[id number], Gabrielle Dare, MD  Subjective: Events of last night noted. Awake and alert this am.   Assessment/Plan: Active Problems:   Left lower extremity cellulitis: Admitted and started on intravenous vancomycin.No further erythema in the left leg-stop Vanco, start Doxycycline. Afebrile, leukocytosis decreasing. Suspect significant contamination of skin by feces. Follow clinically.     Sepsis: Secondary to above.Resolved.Blood cultures negative. Will stop Vancomycin and transition to Doxycycline.       Diarrhea: Has history of ulcerative colitis. Stool negative for  C. difficile PCR.Suspect from bowel surgery and UC.     Hypokalemia:replete and recheck Secondary to above.      History of ulcerative colitis: Continue sulfasalazine. Has diarrhea, suspect has chronic diarrhea at baseline. . Recent sigmoidoscopy in January 2016 showed active proctitis and was started on Canasa suppository-suspect has be non compliant. Will resume Canasa suppository and follow.     Colon cancer: Status post subtotal colectomy with either rectal anastomosis.    Psychosis: Unfortunately found by family in the house-covered with feces and urine, apparently has not had a shower for the past 1 week prior to admission. Family filed IVC, patient was brought to the emergency room by the police department. Appreciate psychiatry consult. Currently awake him answers all questions appropriately.   Disposition: Remain inpatient--will need inpatient psych on discharge  Antibiotics:  See below    Anti-infectives    Start     Dose/Rate Route Frequency Ordered Stop   08/23/14 2300  vancomycin (VANCOCIN) 1,250 mg in sodium chloride 0.9 % 250 mL IVPB  Status:  Discontinued     1,250 mg 166.7 mL/hr over 90 Minutes Intravenous Every 24 hours 08/23/14 0204  08/23/14 1053   08/23/14 1200  vancomycin (VANCOCIN) IVPB 750 mg/150 ml premix     750 mg 150 mL/hr over 60 Minutes Intravenous Every 12 hours 08/23/14 1053     08/22/14 2300  vancomycin (VANCOCIN) IVPB 1000 mg/200 mL premix     1,000 mg 200 mL/hr over 60 Minutes Intravenous STAT 08/22/14 2253 08/23/14 0134      DVT Prophylaxis: Prophylactic Lovenox   Code Status: Full code  Family Communication None at bedside  Procedures:  None  CONSULTS:  None  MEDICATIONS: Scheduled Meds: . cholecalciferol  1,000 Units Oral Daily  . enoxaparin (LOVENOX) injection  40 mg Subcutaneous Q24H  . famotidine  20 mg Oral BID  . mesalamine  1,000 mg Rectal QHS  . multivitamin with minerals  1 tablet Oral Daily  . OLANZapine  5 mg Oral QHS  . potassium chloride  40 mEq Oral Once  . sodium chloride  3 mL Intravenous Q12H  . sulfaSALAzine  1,000 mg Oral QID  . thiamine  100 mg Oral Daily  . vancomycin  750 mg Intravenous Q12H   Continuous Infusions: . sodium chloride 0.9 % 1,000 mL with potassium chloride 20 mEq infusion 75 mL/hr at 08/23/14 1831   PRN Meds:.HYDROcodone-acetaminophen    PHYSICAL EXAM: Vital signs in last 24 hours: Filed Vitals:   08/23/14 0516 08/23/14 2232 08/24/14 0159 08/24/14 0631  BP: 113/57 126/64 125/55 121/52  Pulse: 77 87 73 81  Temp: 98.4 F (36.9 C) 98.5 F (36.9 C) 98.5 F (36.9 C) 98.3 F (36.8 C)  TempSrc: Oral Oral Oral Oral  Resp: 20 20 20 18   Height:  Weight:      SpO2: 100% 100% 99% 99%    Weight change:  Filed Weights   08/22/14 2335  Weight: 68.811 kg (151 lb 11.2 oz)   Body mass index is 23.07 kg/(m^2).   Gen Exam: Awake and alert. Appears disheveled. Sleeping when I walked in, awoke easily. Neck: Supple, No JVD.   Chest: B/L Clear. No rales or rhonchi CVS: S1 S2 Regular Abdomen: soft, BS +, non tender, non distended.  Extremities:No edema. No erythema or swelling noted in left leg.  Neurologic: Non Focal.   Skin: No  Rash.   Wounds: N/A.   Intake/Output from previous day:  Intake/Output Summary (Last 24 hours) at 08/24/14 0853 Last data filed at 08/24/14 0600  Gross per 24 hour  Intake 1251.25 ml  Output      0 ml  Net 1251.25 ml     LAB RESULTS: CBC  Recent Labs Lab 08/22/14 2244 08/23/14 0427 08/24/14 0600  WBC 14.6* 13.8* 9.3  HGB 14.3 12.6* 11.6*  HCT 42.7 38.6* 36.1*  PLT 368 334 366  MCV 86.3 86.2 87.0  MCH 28.9 28.1 28.0  MCHC 33.5 32.6 32.1  RDW 14.1 14.0 14.5    Chemistries   Recent Labs Lab 08/22/14 2244 08/23/14 0427 08/24/14 0600  NA 137 135 141  K 3.0* 3.1* 3.2*  CL 95* 98 104  CO2 28 29 28   GLUCOSE 101* 106* 95  BUN 20 16 11   CREATININE 1.22 1.07 0.87  CALCIUM 9.0 8.5 8.4  MG  --  2.5  --     CBG: No results for input(s): GLUCAP in the last 168 hours.  GFR Estimated Creatinine Clearance: 76.4 mL/min (by C-G formula based on Cr of 0.87).  Coagulation profile No results for input(s): INR, PROTIME in the last 168 hours.  Cardiac Enzymes No results for input(s): CKMB, TROPONINI, MYOGLOBIN in the last 168 hours.  Invalid input(s): CK  Invalid input(s): POCBNP No results for input(s): DDIMER in the last 72 hours. No results for input(s): HGBA1C in the last 72 hours. No results for input(s): CHOL, HDL, LDLCALC, TRIG, CHOLHDL, LDLDIRECT in the last 72 hours.  Recent Labs  08/23/14 0427  TSH 1.605   No results for input(s): VITAMINB12, FOLATE, FERRITIN, TIBC, IRON, RETICCTPCT in the last 72 hours. No results for input(s): LIPASE, AMYLASE in the last 72 hours.  Urine Studies No results for input(s): UHGB, CRYS in the last 72 hours.  Invalid input(s): UACOL, UAPR, USPG, UPH, UTP, UGL, UKET, UBIL, UNIT, UROB, ULEU, UEPI, UWBC, URBC, UBAC, CAST, UCOM, BILUA  MICROBIOLOGY: Recent Results (from the past 240 hour(s))  Blood culture (routine x 2)     Status: None (Preliminary result)   Collection Time: 08/22/14 10:40 PM  Result Value Ref Range  Status   Specimen Description BLOOD RIGHT ARM  Final   Special Requests BOTTLES DRAWN AEROBIC AND ANAEROBIC 5CC  Final   Culture   Final           BLOOD CULTURE RECEIVED NO GROWTH TO DATE CULTURE WILL BE HELD FOR 5 DAYS BEFORE ISSUING A FINAL NEGATIVE REPORT Performed at [facility]    Report Status PENDING  Incomplete  Blood culture (routine x 2)     Status: None (Preliminary result)   Collection Time: 08/22/14 10:50 PM  Result Value Ref Range Status   Specimen Description BLOOD LEFT ARM  Final   Special Requests BOTTLES DRAWN AEROBIC AND ANAEROBIC 5CC  Final   Culture  Final           BLOOD CULTURE RECEIVED NO GROWTH TO DATE CULTURE WILL BE HELD FOR 5 DAYS BEFORE ISSUING A FINAL NEGATIVE REPORT Performed at [facility]    Report Status PENDING  Incomplete  Urine culture     Status: None   Collection Time: 08/22/14 11:22 PM  Result Value Ref Range Status   Specimen Description URINE, CATHETERIZED  Final   Special Requests NONE  Final   Colony Count NO GROWTH Performed at [facility]   Final   Culture NO GROWTH Performed at [facility]   Final   Report Status 08/24/2014 FINAL  Final  Clostridium Difficile by PCR     Status: None   Collection Time: 08/23/14  8:33 AM  Result Value Ref Range Status   C difficile by pcr NEGATIVE NEGATIVE Final    RADIOLOGY STUDIES/RESULTS: No results found.  Oren Binet, MD  [facility] Pager:[phone]  If 7PM-7AM, please contact night-coverage [email or web address] Password [facility] 08/24/2014, 8:53 AM   LOS: 1 day

## 2014-08-24 NOTE — Progress Notes (Signed)
INITIAL NUTRITION ASSESSMENT  DOCUMENTATION CODES Per approved criteria  -Severe malnutrition in the context of chronic illness  Pt meets criteria for severe MALNUTRITION in the context of chronic illness as evidenced by 17% wt loss in <2 months and po intake <75% of estimated needs for >1 month.  INTERVENTION: - Resource Breeze po TID, each supplement provides 250 kcal and 9 grams of protein - RD will continue to monitor  NUTRITION DIAGNOSIS: Inadequate oral intake related to failure to thrive as evidenced by wt loss.   Goal: Pt to meet >/= 90% of their estimated nutrition needs   Monitor:  Weight trend, po intake, acceptance of supplements, labs  Reason for Assessment: Consult for nutrition assessment  69 y.o. male  Admitting Dx: <principal problem not specified>  ASSESSMENT: Patient was brought in from home family has filled out IVC paperwork as he has refused to care for himself. Patient was found in bed covered in feces and urine. Patient has refused to go to a doctor. On physical exam he was noted to have swelling of his lower extremities significant for cellulitis. Patient reports that he was laying down on the floor in order to exercise demons  - Pt with multiple episodes of diarrhea.  - Current diet is regular. Per pt and RN, pt with good po intake.  - Pt not eating well prior to admission.  - Per weight history, pt has lost ~30 lbs in the past 1-2 months and 55 lbs in the past 6 months (significant for time frame).  - Pt does not know his usual body weight.  - Pt agreed to try [employer]. - Labs reviewed K low  Height: Ht Readings from Last 1 Encounters:  08/22/14 5' 8"  (7.124 m)    Weight: Wt Readings from Last 1 Encounters:  08/22/14 151 lb 11.2 oz (68.811 kg)    Ideal Body Weight: 68.4 kg  % Ideal Body Weight: 101%  Wt Readings from Last 10 Encounters:  08/22/14 151 lb 11.2 oz (68.811 kg)  07/17/14 180 lb (81.647 kg)  07/08/14 183 lb (83.008  kg)  07/02/14 183 lb 8 oz (83.235 kg)  05/06/14 193 lb (87.544 kg)  04/17/14 189 lb (85.73 kg)  02/20/14 205 lb 9 oz (93.243 kg)  01/14/14 213 lb (96.616 kg)  12/17/13 221 lb 12.8 oz (100.608 kg)  10/14/13 215 lb (97.523 kg)    Usual Body Weight: 180 lbs  % Usual Body Weight: 83%  BMI:  Body mass index is 23.07 kg/(m^2).  Estimated Nutritional Needs: Kcal: 1850-2000 Protein: 105-120 g Fluid: >2.0 L/day  Skin: stage II pressure ulcer on buttocks  Diet Order:    EDUCATION NEEDS: -Education not appropriate at this time   Intake/Output Summary (Last 24 hours) at 08/24/14 1324 Last data filed at 08/24/14 1315  Gross per 24 hour  Intake 1581.25 ml  Output      0 ml  Net 1581.25 ml    Last BM: 3/12   Labs:   Recent Labs Lab 08/22/14 2244 08/23/14 0427 08/24/14 0600  NA 137 135 141  K 3.0* 3.1* 3.2*  CL 95* 98 104  CO2 28 29 28   BUN 20 16 11   CREATININE 1.22 1.07 0.87  CALCIUM 9.0 8.5 8.4  MG  --  2.5  --   PHOS  --  2.7  --   GLUCOSE 101* 106* 95    CBG (last 3)  No results for input(s): GLUCAP in the last 72 hours.  Scheduled  Meds: . cholecalciferol  1,000 Units Oral Daily  . enoxaparin (LOVENOX) injection  40 mg Subcutaneous Q24H  . famotidine  20 mg Oral BID  . mesalamine  1,000 mg Rectal QHS  . multivitamin with minerals  1 tablet Oral Daily  . OLANZapine  5 mg Oral QHS  . sodium chloride  3 mL Intravenous Q12H  . sulfaSALAzine  1,000 mg Oral QID  . thiamine  100 mg Oral Daily  . vancomycin  750 mg Intravenous Q12H    Continuous Infusions: . sodium chloride 0.9 % 1,000 mL with potassium chloride 20 mEq infusion 75 mL/hr at 08/24/14 1203    Past Medical History  Diagnosis Date  . Vitamin D deficiency   . UC (ulcerative colitis)   . Arthritis   . Osteoporosis   . GERD (gastroesophageal reflux disease)   . Colon cancer   . Eye abnormalities     right eye seen in ED 02/17/2014 wearing eye patch using eye drops  . Claustrophobia   .  Adenomatous colon polyp     Past Surgical History  Procedure Laterality Date  . Ventral hernia repair    . Eus N/A 07/18/2013    Procedure: UPPER ENDOSCOPIC ULTRASOUND (EUS) RADIAL;  Surgeon: Milus Banister, MD;  Location: [facility];  Service: Endoscopy;  Laterality: N/A;  . Intraocular lens insertion Bilateral     6 yrs ago  . Colon resection N/A 08/23/2013    Procedure: Laparoscopic total abdominal colectomy and hernia repair;  Surgeon: Leighton Ruff, MD;  Location: [facility];  Service: General;  Laterality: N/A;  . Colon surgery    . Eye surgery    . Hernia repair    . Incisional hernia repair N/A 02/20/2014    Procedure: LAP ASSISTED INCISIONAL HERNIA REPAIR LYSIS OF ADHESIONS;  Surgeon: Leighton Ruff, MD;  Location: [facility];  Service: General;  Laterality: N/A;  converted to open @ 0935   . Incisional hernia repair N/A 02/20/2014    Procedure: HERNIA REPAIR INCISIONAL;  Surgeon: Leighton Ruff, MD;  Location: [facility];  Service: General;  Laterality: N/A;  With MESH  . Colonoscopy      Laurette Schimke [place], [place], [place]

## 2014-08-25 DIAGNOSIS — F22 Delusional disorders: Secondary | ICD-10-CM

## 2014-08-25 DIAGNOSIS — E43 Unspecified severe protein-calorie malnutrition: Secondary | ICD-10-CM

## 2014-08-25 LAB — BASIC METABOLIC PANEL
Anion gap: 7 (ref 5–15)
BUN: 8 mg/dL (ref 6–23)
CALCIUM: 8.9 mg/dL (ref 8.4–10.5)
CO2: 29 mmol/L (ref 19–32)
Chloride: 105 mmol/L (ref 96–112)
Creatinine, Ser: 0.94 mg/dL (ref 0.50–1.35)
GFR calc Af Amer: >90 mL/min (ref >90)
GFR calc non Af Amer: 83 mL/min — ABNORMAL LOW (ref >90)
GLUCOSE: 95 mg/dL (ref 70–99)
Potassium: 3.6 mmol/L (ref 3.5–5.1)
SODIUM: 141 mmol/L (ref 135–145)

## 2014-08-25 NOTE — Progress Notes (Signed)
Clinical Social Work  CSW contacted the following facilities re: bed availability:  [facility]- available beds. Referral faxed.  [facility]- [facility] Kluver) reports no available beds today but encouraged CSW to follow up tomorrow morning  [facility]- no available beds  [facility]- no available beds  [facility]- no available beds  [facility]- possible DC later this week. Referral faxed.  [facility]- available beds. Referral faxed.  [facility]- no available beds  [facility]- no available beds  [facility]- available beds. Referral faxed.  [facility]- unsure of bed status. Referral faxed.  [facility]- unsure of bed status. Referral faxed.  [facility]- no available [facility]- no available beds  [facility]- no available beds  [facility]- no available beds  [facility]- available beds. Referral faxed.  [facility]- available beds. Referral faxed.  CSW will continue to follow.  [place], [place] [phone]

## 2014-08-25 NOTE — Progress Notes (Addendum)
PATIENT DETAILS Name: Robert Rhodes Age: 69 y.o. Sex: male Date of Birth: [birth year] Admit Date: 08/22/2014 Admitting Physician Toy Baker, MD EBX:[id number], Gabrielle Dare, MD  Subjective: Awake and alert this am  Assessment/Plan: Active Problems:   Left lower extremity cellulitis: Admitted and started on intravenous vancomycin.No further erythema in the left leg-have transitioned to Doxycycline for a few more days. Afebrile, leukocytosis decreasing. Suspect significant contamination of skin by feces. Follow clinically.     Sepsis: Secondary to above.Resolved.Blood cultures negative.      Diarrhea: Has history of ulcerative colitis. Stool negative for  C. difficile PCR.Suspect from bowel surgery and UC.     Hypokalemia:repleted.Secondary to above.      History of ulcerative colitis: Continue sulfasalazine and Canasa. Has diarrhea, suspect has chronic diarrhea at baseline. . Recent sigmoidoscopy in January 2016 showed active proctitis    Colon cancer: Status post subtotal colectomy with either rectal anastomosis.    Psychosis: Unfortunately found by family in the house-covered with feces and urine, apparently has not had a shower for the past 1 week prior to admission. Family filed IVC, patient was brought to the emergency room by the police department. Appreciate psychiatry consult. Started on Zyprexa.     Severe Malnutrition: continue supplements  Disposition: Remain inpatient--will need inpatient psych on discharge. Suspect stable for discharge to [facility] if bed available.  Antibiotics:  See below    Anti-infectives    Start     Dose/Rate Route Frequency Ordered Stop   08/24/14 2200  doxycycline (VIBRA-TABS) tablet 100 mg     100 mg Oral Every 12 hours 08/24/14 1726     08/23/14 2300  vancomycin (VANCOCIN) 1,250 mg in sodium chloride 0.9 % 250 mL IVPB  Status:  Discontinued     1,250 mg 166.7 mL/hr over 90 Minutes Intravenous Every 24 hours 08/23/14 0204 08/23/14  1053   08/23/14 1200  vancomycin (VANCOCIN) IVPB 750 mg/150 ml premix  Status:  Discontinued     750 mg 150 mL/hr over 60 Minutes Intravenous Every 12 hours 08/23/14 1053 08/24/14 1726   08/22/14 2300  vancomycin (VANCOCIN) IVPB 1000 mg/200 mL premix     1,000 mg 200 mL/hr over 60 Minutes Intravenous STAT 08/22/14 2253 08/23/14 0134      DVT Prophylaxis: Prophylactic Lovenox   Code Status: Full code  Family Communication None at bedside  Procedures:  None  CONSULTS:  None  MEDICATIONS: Scheduled Meds: . cholecalciferol  1,000 Units Oral Daily  . doxycycline  100 mg Oral Q12H  . enoxaparin (LOVENOX) injection  40 mg Subcutaneous Q24H  . famotidine  20 mg Oral BID  . feeding supplement (RESOURCE BREEZE)  1 Container Oral TID BM  . mesalamine  1,000 mg Rectal QHS  . multivitamin with minerals  1 tablet Oral Q1200  . OLANZapine  5 mg Oral QHS  . sodium chloride  3 mL Intravenous Q12H  . sulfaSALAzine  1,000 mg Oral QID  . thiamine  100 mg Oral Daily   Continuous Infusions: . sodium chloride 0.9 % 1,000 mL with potassium chloride 20 mEq infusion 75 mL/hr at 08/24/14 2305   PRN Meds:.HYDROcodone-acetaminophen    PHYSICAL EXAM: Vital signs in last 24 hours: Filed Vitals:   08/24/14 0631 08/24/14 1313 08/24/14 1951 08/25/14 0654  BP: 121/52 124/62 110/71 124/89  Pulse: 81 90 120 81  Temp: 98.3 F (36.8 C) 98.3 F (36.8 C) 98 F (36.7 C) 98.4 F (36.9 C)  TempSrc:  Oral Oral Oral Oral  Resp: 18 20 20 22   Height:      Weight:      SpO2: 99% 100% 100% 100%    Weight change:  Filed Weights   08/22/14 2335  Weight: 68.811 kg (151 lb 11.2 oz)   Body mass index is 23.07 kg/(m^2).   Gen Exam: Awake and alert. Appears disheveled. Not in any distress Neck: Supple, No JVD.   Chest: B/L Clear. No rales or rhonchi CVS: S1 S2 Regular Abdomen: soft, BS +, non tender, non distended.  Extremities:No edema. No erythema  noted in left leg.  Neurologic: Non Focal.     Skin: No Rash.   Wounds: N/A.   Intake/Output from previous day:  Intake/Output Summary (Last 24 hours) at 08/25/14 0846 Last data filed at 08/25/14 0700  Gross per 24 hour  Intake   2115 ml  Output      0 ml  Net   2115 ml     LAB RESULTS: CBC  Recent Labs Lab 08/22/14 2244 08/23/14 0427 08/24/14 0600  WBC 14.6* 13.8* 9.3  HGB 14.3 12.6* 11.6*  HCT 42.7 38.6* 36.1*  PLT 368 334 366  MCV 86.3 86.2 87.0  MCH 28.9 28.1 28.0  MCHC 33.5 32.6 32.1  RDW 14.1 14.0 14.5    Chemistries   Recent Labs Lab 08/22/14 2244 08/23/14 0427 08/24/14 0600 08/25/14 0515  NA 137 135 141 141  K 3.0* 3.1* 3.2* 3.6  CL 95* 98 104 105  CO2 28 29 28 29   GLUCOSE 101* 106* 95 95  BUN 20 16 11 8   CREATININE 1.22 1.07 0.87 0.94  CALCIUM 9.0 8.5 8.4 8.9  MG  --  2.5  --   --     CBG: No results for input(s): GLUCAP in the last 168 hours.  GFR Estimated Creatinine Clearance: 70.7 mL/min (by C-G formula based on Cr of 0.94).  Coagulation profile No results for input(s): INR, PROTIME in the last 168 hours.  Cardiac Enzymes No results for input(s): CKMB, TROPONINI, MYOGLOBIN in the last 168 hours.  Invalid input(s): CK  Invalid input(s): POCBNP No results for input(s): DDIMER in the last 72 hours. No results for input(s): HGBA1C in the last 72 hours. No results for input(s): CHOL, HDL, LDLCALC, TRIG, CHOLHDL, LDLDIRECT in the last 72 hours.  Recent Labs  08/23/14 0427  TSH 1.605   No results for input(s): VITAMINB12, FOLATE, FERRITIN, TIBC, IRON, RETICCTPCT in the last 72 hours. No results for input(s): LIPASE, AMYLASE in the last 72 hours.  Urine Studies No results for input(s): UHGB, CRYS in the last 72 hours.  Invalid input(s): UACOL, UAPR, USPG, UPH, UTP, UGL, UKET, UBIL, UNIT, UROB, ULEU, UEPI, UWBC, URBC, UBAC, CAST, UCOM, BILUA  MICROBIOLOGY: Recent Results (from the past 240 hour(s))  Blood culture (routine x 2)     Status: None (Preliminary result)    Collection Time: 08/22/14 10:40 PM  Result Value Ref Range Status   Specimen Description BLOOD RIGHT ARM  Final   Special Requests BOTTLES DRAWN AEROBIC AND ANAEROBIC 5CC  Final   Culture   Final           BLOOD CULTURE RECEIVED NO GROWTH TO DATE CULTURE WILL BE HELD FOR 5 DAYS BEFORE ISSUING A FINAL NEGATIVE REPORT Performed at [facility]    Report Status PENDING  Incomplete  Blood culture (routine x 2)     Status: None (Preliminary result)   Collection Time: 08/22/14 10:50  PM  Result Value Ref Range Status   Specimen Description BLOOD LEFT ARM  Final   Special Requests BOTTLES DRAWN AEROBIC AND ANAEROBIC 5CC  Final   Culture   Final           BLOOD CULTURE RECEIVED NO GROWTH TO DATE CULTURE WILL BE HELD FOR 5 DAYS BEFORE ISSUING A FINAL NEGATIVE REPORT Performed at [facility]    Report Status PENDING  Incomplete  Urine culture     Status: None   Collection Time: 08/22/14 11:22 PM  Result Value Ref Range Status   Specimen Description URINE, CATHETERIZED  Final   Special Requests NONE  Final   Colony Count NO GROWTH Performed at [facility]   Final   Culture NO GROWTH Performed at [facility]   Final   Report Status 08/24/2014 FINAL  Final  Clostridium Difficile by PCR     Status: None   Collection Time: 08/23/14  8:33 AM  Result Value Ref Range Status   C difficile by pcr NEGATIVE NEGATIVE Final    RADIOLOGY STUDIES/RESULTS: No results found.  Oren Binet, MD  [facility] Pager:[phone]  If 7PM-7AM, please contact night-coverage [email or web address] Password [facility] 08/25/2014, 8:46 AM   LOS: 2 days

## 2014-08-25 NOTE — Progress Notes (Signed)
Pt was in bathroom for over 2 hrs, jerking, hitting himself, and appeared to be responding to an external stimuli. When asked what he was doing, pt stated he was "draining." Pt also stated he was "letting out the demons." Contacted NP on call in regards to pt due to safety concerns. Received order for safety sitter. Pt is now back in bed, complains of no distress. Will continue to monitor pt.

## 2014-08-25 NOTE — Consult Note (Signed)
Psychiatry Consult Follow up  Reason for Consult:  Psychosis, capacity Referring Physician:  Dr. Sloan Leiter Patient Identification: Robert Rhodes MRN:  [id number] Principal Diagnosis: <principal problem not specified> Diagnosis:   Patient Active Problem List   Diagnosis Date Noted  . Protein-calorie malnutrition, severe [E43] 08/24/2014  . Cellulitis [L03.90] 08/23/2014  . Medically noncompliant [Z91.19] 08/23/2014  . Left leg cellulitis [Y65.993] 08/23/2014  . Cellulitis of left leg [L03.116]   . Hypokalemia [E87.6]   . Diarrhea [R19.7] 05/06/2014  . Ulcerative colitis [K51.90] 04/17/2014  . Verrucous keratosis [L98.8] 04/17/2014  . Nonspecific (abnormal) findings on radiological and other examination of gastrointestinal tract [R93.3] 07/18/2013  . Acute esophagitis [K20.9] 07/18/2013  . Colon cancer [C18.9] 07/16/2013  . Hypovitaminosis D [E55.9] 05/06/2013  . Ulcerative colitis, unspecified [K51.90] 04/04/2013  . Knee pain, bilateral [M25.561, M25.562] 03/05/2013    Total Time spent with patient: 30 minutes  Subjective:   Robert Rhodes is a 69 y.o. male patient admitted with cellulitis and confusion  HPI:  This patient is a 69 year old black male who lives with his niece and his brother in [place]. He was admitted under involuntary petition filed by his family because he refused to take care of himself. He was in bed covered in feces and urine and refused to go to a doctor. He has been diagnosed with cellulitis and sepsis and is currently receiving IV antibiotics. He was found disheveled on the floor at home and was not eating  or sleeping. The patient has a history of ulcerative colitis and had colon cancer in January 2015 found to be adenocarcinoma and had a subtotal colectomy in March 2015. In January 2015 he had an MRI that showed atypical hepatic lesion is supposed to have this reevaluated but he has been refusing the MRI. He tells me today however that he is going to be  having it on March 17.  On interview the patient tends to ramble and is focusing on "his spiritual life" I attempted to contact his brother and niece and they state that he is Rastafarian and these concepts are part of his religious views. The niece indicates that he was committed to some sort of psychiatric facility in 2012 in [place] but doesn't know anything more about it. He denies auditory or visual hallucinations but seems somewhat disorganized in his thinking. He denies suicidal or homicidal ideation. He admits that he is a vegetarian only eats one meal a day. According to the notes he seems more organized and coherent than he did on the first day and much of his confusion may have been secondary to the sepsis. We can continue to keep a close eye on him and perhaps use a low-dose antipsychotic at bedtime. He denies the use of drugs or alcohol but used to smoke marijuana up until about 15 years ago. Patient seen again today, 08/24/14. He seems more coherent and alert. He claims that he is eating well and sleeping well. Zyprexa was started yesterday. He has many fixed ideas about how to live which include fasting at times as part of his religion. I doubt that these will change. He denies thoughts of suicide or harm to others or auditory or visual hallucinations  Interval history: Patient appeared sitting in a chair next to his bed with poor hygiene. He has disorganized, bizarre delusions and talks about spiritual life, not taking showers, not eating thirty days at time and unable to care for himself. His niece and brother concern about his safety at this  time. Reportedly patient has psych admission in [place], he states that was being on observation but not for treatment. He has no signs of dementia or delirium. He has intact cognitions including orientation, memory and language functions. Patient compliant with his medication and no adverse effects. We will obtain collateral information from family.    Past Medical History:  Past Medical History  Diagnosis Date  . Vitamin D deficiency   . UC (ulcerative colitis)   . Arthritis   . Osteoporosis   . GERD (gastroesophageal reflux disease)   . Colon cancer   . Eye abnormalities     right eye seen in ED 02/17/2014 wearing eye patch using eye drops  . Claustrophobia   . Adenomatous colon polyp     Past Surgical History  Procedure Laterality Date  . Ventral hernia repair    . Eus N/A 07/18/2013    Procedure: UPPER ENDOSCOPIC ULTRASOUND (EUS) RADIAL;  Surgeon: Milus Banister, MD;  [facility];  Service: Endoscopy;  Laterality: N/A;  . Intraocular lens insertion Bilateral     6 yrs ago  . Colon resection N/A 08/23/2013    Procedure: Laparoscopic total abdominal colectomy and hernia repair;  Surgeon: Leighton Ruff, MD;  Location: [facility];  Service: General;  Laterality: N/A;  . Colon surgery    . Eye surgery    . Hernia repair    . Incisional hernia repair N/A 02/20/2014    Procedure: LAP ASSISTED INCISIONAL HERNIA REPAIR LYSIS OF ADHESIONS;  Surgeon: Leighton Ruff, MD;  Location: [facility];  Service: General;  Laterality: N/A;  converted to open @ 0935   . Incisional hernia repair N/A 02/20/2014    Procedure: HERNIA REPAIR INCISIONAL;  Surgeon: Leighton Ruff, MD;  Location: [facility];  Service: General;  Laterality: N/A;  With MESH  . Colonoscopy     Family History:  Family History  Problem Relation Age of Onset  . Cancer Mother     type unknown  . Diabetes Father   . Heart disease Father   . Heart disease Sister   . Heart attack Brother   . Colon cancer Neg Hx   . Esophageal cancer Neg Hx   . Rectal cancer Neg Hx   . Stomach cancer Neg Hx    Social History:  History  Alcohol Use No     History  Drug Use  . Yes  . Special: Marijuana    Comment: quit in 2005    History   Social History  . Marital Status: Single    Spouse Name: N/A  . Number of Children: 0  . Years of Education: N/A   Occupational History  .  retired    Social History Main Topics  . Smoking status: Former Smoker -- 1.00 packs/day    Types: Cigarettes    Quit date: 06/14/2003  . Smokeless tobacco: Never Used     Comment: quit in July 2005  . Alcohol Use: No  . Drug Use: Yes    Special: Marijuana     Comment: quit in 2005  . Sexual Activity: Yes   Other Topics Concern  . None   Social History Narrative   Additional Social History:                          Allergies:   Allergies  Allergen Reactions  . Ace Inhibitors Other (See Comments)    Cant remember  . Advil [Ibuprofen] Other (See Comments)  Muscle tightness  . Aspirin Nausea And Vomiting  . Tylenol [Acetaminophen] Other (See Comments)    Muscle tightness    Vitals: Blood pressure 124/89, pulse 81, temperature 98.4 F (36.9 C), temperature source Oral, resp. rate 22, height 5' 8"  (1.727 m), weight 68.811 kg (151 lb 11.2 oz), SpO2 100 %.  Risk to Self: Is patient at risk for suicide?: No Risk to Others:   Prior Inpatient Therapy:   Prior Outpatient Therapy:    Current Facility-Administered Medications  Medication Dose Route Frequency Provider Last Rate Last Dose  . cholecalciferol (VITAMIN D) tablet 1,000 Units  1,000 Units Oral Daily Toy Baker, MD   1,000 Units at 08/24/14 1056  . doxycycline (VIBRA-TABS) tablet 100 mg  100 mg Oral Q12H Jonetta Osgood, MD   100 mg at 08/24/14 2304  . enoxaparin (LOVENOX) injection 40 mg  40 mg Subcutaneous Q24H Toy Baker, MD   40 mg at 08/23/14 0933  . famotidine (PEPCID) tablet 20 mg  20 mg Oral BID Toy Baker, MD   20 mg at 08/24/14 2304  . feeding supplement (RESOURCE BREEZE) (RESOURCE BREEZE) liquid 1 Container  1 Container Oral TID BM Dorann Ou, RD   1 Container at 08/24/14 1400  . HYDROcodone-acetaminophen (NORCO/VICODIN) 5-325 MG per tablet 1-2 tablet  1-2 tablet Oral Q4H PRN Toy Baker, MD   1 tablet at 08/24/14 0140  . mesalamine (CANASA) suppository 1,000  mg  1,000 mg Rectal QHS Jonetta Osgood, MD   1,000 mg at 08/24/14 2200  . multivitamin with minerals tablet 1 tablet  1 tablet Oral Q1200 Minda Ditto, RPH      . OLANZapine (ZYPREXA) tablet 5 mg  5 mg Oral QHS Cloria Spring, MD   5 mg at 08/24/14 2304  . sodium chloride 0.9 % 1,000 mL with potassium chloride 20 mEq infusion   Intravenous Continuous Jonetta Osgood, MD 75 mL/hr at 08/24/14 2305    . sodium chloride 0.9 % injection 3 mL  3 mL Intravenous Q12H Toy Baker, MD   3 mL at 08/23/14 2202  . sulfaSALAzine (AZULFIDINE) tablet 1,000 mg  1,000 mg Oral QID Toy Baker, MD   1,000 mg at 08/24/14 2303  . thiamine (VITAMIN B-1) tablet 100 mg  100 mg Oral Daily Toy Baker, MD   100 mg at 08/24/14 1056    Musculoskeletal: Strength & Muscle Tone: within normal limits Gait & Station: normal Patient leans: N/A  Psychiatric Specialty Exam: Physical Exam  Psychiatric: He has a normal mood and affect. His speech is normal and behavior is normal. Thought content is delusional. Cognition and memory are impaired. He expresses inappropriate judgment.    ROS  Blood pressure 124/89, pulse 81, temperature 98.4 F (36.9 C), temperature source Oral, resp. rate 22, height 5' 8"  (1.727 m), weight 68.811 kg (151 lb 11.2 oz), SpO2 100 %.Body mass index is 23.07 kg/(m^2).  General Appearance: Bizarre and Casual  Eye Contact::  Fair  Speech:  Garbled  Volume:  Decreased  Mood:improved  Affect: brighter  Thought Process:  Circumstantial and Loose  Orientation:  Full (Time, Place, and Person)  Thought Content:  Obsessions and Rumination  Suicidal Thoughts:  No  Homicidal Thoughts:  No  Memory:  Immediate;   Fair Recent;   Fair Remote;   Fair  Judgement:  Impaired  Insight:  Lacking  Psychomotor Activity: decreased  Concentration:  Fair  Recall:  [employer] of Knowledge:Fair  Language: Fair  Akathisia:  No  Handed:  Right  AIMS (if indicated):     Assets:   Communication Skills Desire for Improvement Social Support  ADL's:  Impaired  Cognition: WNL  Sleep:      Medical Decision Making: New problem, with additional work up planned, Review of Psycho-Social Stressors (1), Decision to obtain old records (1) and Review and summation of old records (2)  Treatment Plan Summary: Daily contact with patient to assess and evaluate symptoms and progress in treatment and Medication management  Plan: Continue Zyprexa 5 mg by mouth at bedtime. Recommend psychiatric Inpatient admission when medically cleared.  Disposition: His cognitive status seems to be improving as his sepsis is being treated. Will ask psych consultant to follow tomorrow  Latoyia Tecson,JANARDHAHA R. 08/25/2014 9:01 AM

## 2014-08-25 NOTE — Progress Notes (Signed)
Clinical Social Work Department CLINICAL SOCIAL WORK PSYCHIATRY SERVICE LINE ASSESSMENT 08/25/2014  Patient:  Robert Rhodes  Account:  [id number]  [place] Date:  08/22/2014  Clinical Social Worker:  Sindy Messing, LCSW  Date/Time:  08/25/2014 03:00 PM Referred by:  Physician  Date referred:  08/25/2014 Reason for Referral  Behavioral Health Issues   Presenting Symptoms/Problems (In the person's/family's own words):   Psych consulted due to psychosis   Abuse/Neglect/Trauma History (check all that apply)  Denies history   Abuse/Neglect/Trauma Comments:   Psychiatric History (check all that apply)  Inpatient/hospitilization   Psychiatric medications:  Zyprexa 5 mg   Current Mental Health Hospitalizations/Previous Mental Health History:   Patient denies any previous MH diagnosis. Patient reports he went to a psych hospital a couple of years ago "just for a study" but nothing was determined no medication was started.   Current provider:   None currently   Place and Date:   N/A   Current Medications:   Scheduled Meds:      . cholecalciferol  1,000 Units Oral Daily  . doxycycline  100 mg Oral Q12H  . enoxaparin (LOVENOX) injection  40 mg Subcutaneous Q24H  . famotidine  20 mg Oral BID  . feeding supplement (RESOURCE BREEZE)  1 Container Oral TID BM  . mesalamine  1,000 mg Rectal QHS  . multivitamin with minerals  1 tablet Oral Q1200  . OLANZapine  5 mg Oral QHS  . sodium chloride  3 mL Intravenous Q12H  . sulfaSALAzine  1,000 mg Oral QID  . thiamine  100 mg Oral Daily        Continuous Infusions:      PRN Meds:.HYDROcodone-acetaminophen       Previous Impatient Admission/Date/Reason:   Patient reports one previous hospitalization.   Emotional Health / Current Symptoms    Suicide/Self Harm  None reported   Suicide attempt in the past:   Patient denies any SI or HI.   Other harmful behavior:   None reported   Psychotic/Dissociative Symptoms  Delusional  Auditory  Hallucinations  Visual Hallucinations   Other Psychotic/Dissociative Symptoms:   Patient delusional and admits to hallucinations. Patient reports that he needs to perform exorcisms in order to get rid of spirits and ghosts that are in his house and at the hospital. Patient reports he has been spiritually attacked and had to start performing exorcisms around 1 month ago.    Attention/Behavioral Symptoms  Inattentive   Other Attention / Behavioral Symptoms:   Patient has flight of ideas and has to be redirected multiple times.    Cognitive Impairment  Within Normal Limits   Other Cognitive Impairment:   Patient alert and oriented and did well completing mini mental status exam.    Mood and Adjustment  Unusual/bizarre    Stress, Anxiety, Trauma, Any Recent Loss/Stressor  None reported   Anxiety (frequency):   N/A   Phobia (specify):   N/A   Compulsive behavior (specify):   N/A   Obsessive behavior (specify):   N/A   Other:   N/A   Substance Abuse/Use  None   SBIRT completed (please refer for detailed history):  N  Self-reported substance use:   Patient denies any substance use but patient tested positive for THC.   Urinary Drug Screen Completed:  Y Alcohol level:   <5    Environmental/Housing/Living Arrangement  Stable housing   Who is in the home:   Niece and brother   Emergency contact:  [place]  Medicare  Medicaid   Patient's Strengths and Goals (patient's own words):   Patient has supportive family. Patient feels very close to his religion and is happy with his spiritual pathway.   Clinical Social Worker's Interpretive Summary:   CSW received referral in order to complete psychosocial assessment. CSW reviewed chart and met with patient at bedside with psych MD. Patient sitting in chair and eating breakfast when CSW arrived.    Patient reports he lives at home with his niece and brother. Patient reports he was working on  his spiritual wellbeing and that his family "got spooked" by his behavior. Patient reports he is a vegetarian because he does not want to hurt any individuals. Patient reports that he was trying to perform exorcisms and meditating. Patient reports that he was not eating and not caring for his hygiene properly and family placed him under IVC.    Patient reports that he moved to  a couple of years ago in order to stay with niece because she had never lived alone and needed extra assistance. 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 Patient reports that brother and niece do not work as well and are with him during the day.    Patient denies any MH diagnosis but does report he was "studied" at a psych hospital. Patient reports no diagnosis arose from hospitalization and he was never placed on medication. Psych MD completed MMSE and patient did well with orientation. Patient remains delusional and believes that his spiritual wellbeing is priority for all care. Patient agreeable for CSW to contact family members for collateral information.    CSW spoke with niece Robert Rhodes) who is currently living in [place]. Patient was living with niece until about 8 months ago when niece decided to move to [place] and patient did not want to relocate with her. Patient and niece lived together for about 4 years prior to move. Niece reports that patient has always had "odd behavior" such as fasting for several days and being stubborn re: personal hygiene. Niece reports she was strict with patient and made him care for daily needs such as bathing and using the bathroom and patient would listen to her. Niece reports other family members called her about patient's behavior and she encouraged them to place him under IVC so he could be evaluated. Niece is unaware of any MH diagnosis but reports patient has always been "obsessed with his spiritual adventure."     CSW spoke with niece Robert Rhodes) who patient is currently living with. Niece reports that patient was completely independent and behaved normally until about 2 months ago. Patient became isolated and refused to be compliant with any medical appointments. Niece reports patient would yell for hours about nothing and became incontinent and refused to sleep. Family reports that patient was hospitalized from 2012-2013 in [place] but family is not aware of any details of hospitalization. Family reports a family history of dementia but denies any further knowledge of patient's history.    CSW reviewed chart which had IVC forms completed but no examination form was completed. Psych MD aware and reports he will staff case to consider if patient is meeting criteria for IVC. CSW will contact hospitals re: inpatient placement.   Disposition:  Inpatient referral made [facility], [facility], [place])   [place], [place] [phone]

## 2014-08-26 DIAGNOSIS — F24 Shared psychotic disorder: Secondary | ICD-10-CM

## 2014-08-26 LAB — GI PATHOGEN PANEL BY PCR, STOOL
C difficile toxin A/B: NOT DETECTED
Campylobacter by PCR: NOT DETECTED
Cryptosporidium by PCR: NOT DETECTED
E COLI 0157 BY PCR: NOT DETECTED
E coli (ETEC) LT/ST: NOT DETECTED
E coli (STEC): NOT DETECTED
G LAMBLIA BY PCR: NOT DETECTED
Norovirus GI/GII: NOT DETECTED
Rotavirus A by PCR: NOT DETECTED
SALMONELLA BY PCR: NOT DETECTED
Shigella by PCR: NOT DETECTED

## 2014-08-26 MED ORDER — BOOST / RESOURCE BREEZE PO LIQD
1.0000 | Freq: Three times a day (TID) | ORAL | Status: DC
Start: 2014-08-26 — End: 2017-07-28

## 2014-08-26 MED ORDER — OLANZAPINE 5 MG PO TABS
5.0000 mg | ORAL_TABLET | Freq: Every day | ORAL | Status: DC
Start: 2014-08-26 — End: 2015-06-23

## 2014-08-26 MED ORDER — THIAMINE HCL 100 MG PO TABS: 100.0000 mg | ORAL_TABLET | Freq: Every day | ORAL | Status: DC

## 2014-08-26 MED ORDER — ADULT MULTIVITAMIN W/MINERALS CH: 1.0000 | ORAL_TABLET | Freq: Every day | ORAL | Status: DC

## 2014-08-26 NOTE — Progress Notes (Addendum)
Clinical Social Work  CSW rounded with psych MD to evaluate patient. Patient continues to have bizarre behavior and speaks of "spiritual self". Patient reports he needs to "pump the fluids" out of his body and places his head on his head and squeezes his head to remove fluids. Patient has to be redirected to in order to talk about other topics. Psych MD explained the need for inpatient placement and patient understanding that he will have to go for treatment due to IVC status.  IVC forms in chart did not have examination form attached so psych MD signed new forms which were faxed to the [facility]. CSW spoke with Magistrate who confirmed paperwork was received and patient will be served.  RN told CSW that a facility called on 3/14 at 7pm reporting they had a bed for patient. RN did not have information for which hospital was able to offer a bed. CSW called all facilities that referrals have been faxed to but unable to locate which hospital was interested in accepting patient. Several hospitals reported that bed was probably given away to another patient at this time. CSW contacted the following facilities re: bed availability:  [facility]- referral received but has not been reviewed at this time.  [facility]- AC Randall Hiss) reports he will review information and contact CSW if beds become available.  [facility]- available beds. Referral faxed.  [facility]- no available beds  [facility]- no available beds  [facility]- admissions confirms referral was received but no available beds  [facility]- admissions confirms referral was received but has not reviewed information  [facility]- no available beds  [facility]- no available beds  [facility]- admissions confirms that referral was received but reports high census in their ED so no available beds at this time  [facility]- admissions reports high census and referral has not been reviewed at this time.  [facility]- admissions confirms patient is on  waiting list  [facility]- no available beds  [facility]- no available beds  [facility]- no available beds  [facility]- no available beds  [facility]- admissions reports the lost referral and asked CSW to send information again. Referral was re-faxed.  [facility]- no available beds.  [facility]- patient denied on 3/15  [facility]- no available beds.  CSW will continue to follow.  Sindy Messing, [place] [phone]  Addendum 1640 CSW received a call from [facility] reporting that patient's family called with questions. CSW spoke with niece Halina Maidens) who reports she visited with patient and feels he is improved and doing better. Niece reports she would be willing to take patient home and feels she could convince him to perform daily hygiene tasks and could ensure he followed up with doctor appointments. Niece confirms no current outpatient follow up but reports she could make sure that patient is compliant with treatment if CSW provided referral. Niece reports that patient needs to DC because he needs to pay his rent and is concerned about eviction if patient is not DC. CSW explained psych MD recommendation for inpatient psych and that patient remains under IVC. CSW will continue to search for placement and staff case with psych MD.

## 2014-08-26 NOTE — Consult Note (Signed)
Psychiatry Consult Follow up  Reason for Consult:  Psychosis, capacity Referring Physician:  Dr. Sloan Leiter Patient Identification: Robert Rhodes MRN:  [id number] Principal Diagnosis: <principal problem not specified> Diagnosis:   Patient Active Problem List   Diagnosis Date Noted  . Protein-calorie malnutrition, severe [E43] 08/24/2014  . Cellulitis [L03.90] 08/23/2014  . Medically noncompliant [Z91.19] 08/23/2014  . Left leg cellulitis [V77.939] 08/23/2014  . Cellulitis of left leg [L03.116]   . Hypokalemia [E87.6]   . Diarrhea [R19.7] 05/06/2014  . Ulcerative colitis [K51.90] 04/17/2014  . Verrucous keratosis [L98.8] 04/17/2014  . Nonspecific (abnormal) findings on radiological and other examination of gastrointestinal tract [R93.3] 07/18/2013  . Acute esophagitis [K20.9] 07/18/2013  . Colon cancer [C18.9] 07/16/2013  . Hypovitaminosis D [E55.9] 05/06/2013  . Ulcerative colitis, unspecified [K51.90] 04/04/2013  . Knee pain, bilateral [M25.561, M25.562] 03/05/2013    Total Time spent with patient: 20 minutes  Subjective:   Robert Rhodes is a 69 y.o. male patient admitted with cellulitis and confusion  HPI:  This patient is a 69 year old black male who lives with his niece and his brother in [place]. He was admitted under involuntary petition filed by his family because he refused to take care of himself. He was in bed covered in feces and urine and refused to go to a doctor. He has been diagnosed with cellulitis and sepsis and is currently receiving IV antibiotics. He was found disheveled on the floor at home and was not eating  or sleeping. The patient has a history of ulcerative colitis and had colon cancer in January 2015 found to be adenocarcinoma and had a subtotal colectomy in March 2015. In January 2015 he had an MRI that showed atypical hepatic lesion is supposed to have this reevaluated but he has been refusing the MRI. He tells me today however that he is going to be  having it on March 17.  On interview the patient tends to ramble and is focusing on "his spiritual life" I attempted to contact his brother and niece and they state that he is Rastafarian and these concepts are part of his religious views. The niece indicates that he was committed to some sort of psychiatric facility in 2012 in [place] but doesn't know anything more about it. He denies auditory or visual hallucinations but seems somewhat disorganized in his thinking. He denies suicidal or homicidal ideation. He admits that he is a vegetarian only eats one meal a day. According to the notes he seems more organized and coherent than he did on the first day and much of his confusion may have been secondary to the sepsis. We can continue to keep a close eye on him and perhaps use a low-dose antipsychotic at bedtime. He denies the use of drugs or alcohol but used to smoke marijuana up until about 15 years ago. Patient seen again today, 08/24/14. He seems more coherent and alert. He claims that he is eating well and sleeping well. Zyprexa was started yesterday. He has many fixed ideas about how to live which include fasting at times as part of his religion. I doubt that these will change. He denies thoughts of suicide or harm to others or auditory or visual hallucinations  Interval history: Patient seen today for psychiatric consultation follow-up with Sindy Messing, LCSW. Patient appeared sitting in a chair next to his bed with poor hygiene. He has bizarre behaviors and disorganized thought process, believe exorcism, delusions and talks about spiritual life. Reportedly patient was not able to care for  himself which resulted poor hygiene with the unshaven beard and matted hair. His niece and brother has significant concern about his safety at this time and involuntarily committed him into the hospital. Reportedly patient has psych admission in [place], he states that was being on observation but not for treatment. He  has intact cognitions including language functions and has no signs of dementia or delirium. He has intact cognitions including orientation, memory and language functions. Patient compliant with his medication and no adverse effects. Patient family reported that patient has been suffering with chronic mental illness and bizarre behaviors which was exacerbated in the recent past. Family could not provide more details and reportedly was not taking any medication at home.  Past Medical History:  Past Medical History  Diagnosis Date  . Vitamin D deficiency   . UC (ulcerative colitis)   . Arthritis   . Osteoporosis   . GERD (gastroesophageal reflux disease)   . Colon cancer   . Eye abnormalities     right eye seen in ED 02/17/2014 wearing eye patch using eye drops  . Claustrophobia   . Adenomatous colon polyp     Past Surgical History  Procedure Laterality Date  . Ventral hernia repair    . Eus N/A 07/18/2013    Procedure: UPPER ENDOSCOPIC ULTRASOUND (EUS) RADIAL;  Surgeon: Milus Banister, MD;  Location: [facility];  Service: Endoscopy;  Laterality: N/A;  . Intraocular lens insertion Bilateral     6 yrs ago  . Colon resection N/A 08/23/2013    Procedure: Laparoscopic total abdominal colectomy and hernia repair;  Surgeon: Leighton Ruff, MD;  Location: [facility]S;  Service: General;  Laterality: N/A;  . Colon surgery    . Eye surgery    . Hernia repair    . Incisional hernia repair N/A 02/20/2014    Procedure: LAP ASSISTED INCISIONAL HERNIA REPAIR LYSIS OF ADHESIONS;  Surgeon: Leighton Ruff, MD;  Location: [facility];  Service: General;  Laterality: N/A;  converted to open @ 0935   . Incisional hernia repair N/A 02/20/2014    Procedure: HERNIA REPAIR INCISIONAL;  Surgeon: Leighton Ruff, MD;  Location: [facility];  Service: General;  Laterality: N/A;  With MESH  . Colonoscopy     Family History:  Family History  Problem Relation Age of Onset  . Cancer Mother     type unknown  . Diabetes Father   .  Heart disease Father   . Heart disease Sister   . Heart attack Brother   . Colon cancer Neg Hx   . Esophageal cancer Neg Hx   . Rectal cancer Neg Hx   . Stomach cancer Neg Hx    Social History:  History  Alcohol Use No     History  Drug Use  . Yes  . Special: Marijuana    Comment: quit in 2005    History   Social History  . Marital Status: Single    Spouse Name: N/A  . Number of Children: 0  . Years of Education: N/A   Occupational History  . retired    Social History Main Topics  . Smoking status: Former Smoker -- 1.00 packs/day    Types: Cigarettes    Quit date: 06/14/2003  . Smokeless tobacco: Never Used     Comment: quit in July 2005  . Alcohol Use: No  . Drug Use: Yes    Special: Marijuana     Comment: quit in 2005  . Sexual Activity: Yes  Other Topics Concern  . None   Social History Narrative   Additional Social History:                          Allergies:   Allergies  Allergen Reactions  . Ace Inhibitors Other (See Comments)    Cant remember  . Advil [Ibuprofen] Other (See Comments)    Muscle tightness  . Aspirin Nausea And Vomiting  . Tylenol [Acetaminophen] Other (See Comments)    Muscle tightness    Vitals: Blood pressure 126/66, pulse 80, temperature 98.2 F (36.8 C), temperature source Oral, resp. rate 18, height 5' 8"  (1.727 m), weight 68.811 kg (151 lb 11.2 oz), SpO2 100 %.  Risk to Self: Is patient at risk for suicide?: No Risk to Others:   Prior Inpatient Therapy:   Prior Outpatient Therapy:    Current Facility-Administered Medications  Medication Dose Route Frequency Provider Last Rate Last Dose  . cholecalciferol (VITAMIN D) tablet 1,000 Units  1,000 Units Oral Daily Toy Baker, MD   1,000 Units at 08/26/14 1037  . enoxaparin (LOVENOX) injection 40 mg  40 mg Subcutaneous Q24H Toy Baker, MD   40 mg at 08/25/14 0923  . famotidine (PEPCID) tablet 20 mg  20 mg Oral BID Toy Baker, MD   20 mg at  08/26/14 1037  . feeding supplement (RESOURCE BREEZE) (RESOURCE BREEZE) liquid 1 Container  1 Container Oral TID BM Dorann Ou, RD   1 Container at 08/26/14 1037  . HYDROcodone-acetaminophen (NORCO/VICODIN) 5-325 MG per tablet 1-2 tablet  1-2 tablet Oral Q4H PRN Toy Baker, MD   1 tablet at 08/24/14 0140  . mesalamine (CANASA) suppository 1,000 mg  1,000 mg Rectal QHS Jonetta Osgood, MD   1,000 mg at 08/25/14 2233  . multivitamin with minerals tablet 1 tablet  1 tablet Oral Q1200 Minda Ditto, RPH   1 tablet at 08/25/14 1222  . OLANZapine (ZYPREXA) tablet 5 mg  5 mg Oral QHS Cloria Spring, MD   5 mg at 08/25/14 2231  . sodium chloride 0.9 % injection 3 mL  3 mL Intravenous Q12H Toy Baker, MD   3 mL at 08/26/14 1038  . sulfaSALAzine (AZULFIDINE) tablet 1,000 mg  1,000 mg Oral QID Toy Baker, MD   1,000 mg at 08/26/14 1037  . thiamine (VITAMIN B-1) tablet 100 mg  100 mg Oral Daily Toy Baker, MD   100 mg at 08/26/14 1037    Musculoskeletal: Strength & Muscle Tone: within normal limits Gait & Station: normal Patient leans: N/A  Psychiatric Specialty Exam: Physical Exam  Psychiatric: He has a normal mood and affect. His speech is normal and behavior is normal. Thought content is delusional. Cognition and memory are impaired. He expresses inappropriate judgment.    ROS  Blood pressure 126/66, pulse 80, temperature 98.2 F (36.8 C), temperature source Oral, resp. rate 18, height 5' 8"  (1.727 m), weight 68.811 kg (151 lb 11.2 oz), SpO2 100 %.Body mass index is 23.07 kg/(m^2).  General Appearance: Bizarre and Casual  Eye Contact::  Fair  Speech:  Garbled  Volume:  Decreased  Mood:improved  Affect: brighter  Thought Process:  Circumstantial and Loose  Orientation:  Full (Time, Place, and Person)  Thought Content:  Obsessions and Rumination  Suicidal Thoughts:  No  Homicidal Thoughts:  No  Memory:  Immediate;   Fair Recent;   Fair Remote;   Fair   Judgement:  Impaired  Insight:  Lacking  Psychomotor Activity: decreased  Concentration:  Fair  Recall:  [employer] of Knowledge:Fair  Language: Fair  Akathisia:  No  Handed:  Right  AIMS (if indicated):     Assets:  Communication Skills Desire for Improvement Social Support  ADL's:  Impaired  Cognition: WNL  Sleep:      Medical Decision Making: New problem, with additional work up planned, Review of Psycho-Social Stressors (1), Decision to obtain old records (1) and Review and summation of old records (2)  Treatment Plan Summary: Daily contact with patient to assess and evaluate symptoms and progress in treatment and Medication management  Plan: IVC petition and first certificate completed and pending placement Continue Zyprexa 5 mg by mouth at bedtime. Recommend psychiatric Inpatient admission when medically cleared.  Disposition: Recommended acute psychiatric hospitalization for crisis stabilization, safety and medication management of psychosis.  Kamalei Roeder,JANARDHAHA R. 08/26/2014 11:25 AM

## 2014-08-26 NOTE — Discharge Summary (Signed)
PATIENT DETAILS Name: Robert Rhodes Age: 69 y.o. Sex: male Date of Birth: 07/10/1945 MRN: [id number]. Admitting Physician: Toy Baker, MD GBT:[id number], Gabrielle Dare, MD  Admit Date: 08/22/2014 Discharge date: 08/26/2014  Recommendations for Outpatient Follow-up:  1. Please check CBC and chemistries in 7-10 days. 2. Will need outpatient follow-up with Dr. Ulice Dash Pyrtle-primary gastroenterologist within 2-3 weeks after stabilization of psychiatric issues. 3. Please have patient follow-up with PCP in 2 weeks after stabilization of psychiatric issues.  PRIMARY DISCHARGE DIAGNOSIS:  Active Problems:   Colon cancer   Cellulitis   Medically noncompliant   Left leg cellulitis   Protein-calorie malnutrition, severe      PAST MEDICAL HISTORY: Past Medical History  Diagnosis Date  . Vitamin D deficiency   . UC (ulcerative colitis)   . Arthritis   . Osteoporosis   . GERD (gastroesophageal reflux disease)   . Colon cancer   . Eye abnormalities     right eye seen in ED 02/17/2014 wearing eye patch using eye drops  . Claustrophobia   . Adenomatous colon polyp     DISCHARGE MEDICATIONS: Current Discharge Medication List    START taking these medications   Details  feeding supplement, RESOURCE BREEZE, (RESOURCE BREEZE) LIQD Take 1 Container by mouth 3 (three) times daily between meals. Refills: 0    Multiple Vitamin (MULTIVITAMIN WITH MINERALS) TABS tablet Take 1 tablet by mouth daily at 12 noon.    OLANZapine (ZYPREXA) 5 MG tablet Take 1 tablet (5 mg total) by mouth at bedtime.    thiamine 100 MG tablet Take 1 tablet (100 mg total) by mouth daily.      CONTINUE these medications which have NOT CHANGED   Details  cholecalciferol (VITAMIN D) 1000 UNITS tablet Take 1,000 Units by mouth daily.    famotidine (PEPCID) 20 MG tablet Take 1 tablet (20 mg total) by mouth 2 (two) times daily. Qty: 180 tablet, Refills: 3   Associated Diagnoses: Ulcerative colitis, unspecified      sulfaSALAzine (AZULFIDINE) 500 MG tablet Take 2 tablets (1,000 mg total) by mouth 4 (four) times daily. Qty: 240 tablet, Refills: 3   Associated Diagnoses: Ulcerative colitis, with rectal bleeding    vitamin A 10000 UNIT capsule Take 10,000 Units by mouth daily.    vitamin E 100 UNIT capsule Take 100 Units by mouth daily.    atropine 1 % ophthalmic solution Place 1 drop into the right eye 2 (two) times daily.    cyclobenzaprine (FLEXERIL) 5 MG tablet Take 1 tablet (5 mg total) by mouth every 4 (four) hours as needed for muscle spasms. Qty: 30 tablet, Refills: 0    mesalamine (CANASA) 1000 MG suppository Place 1 suppository (1,000 mg total) rectally at bedtime. Qty: 30 suppository, Refills: 3   Associated Diagnoses: UC (ulcerative colitis), without complications      STOP taking these medications     AMBULATORY NON FORMULARY MEDICATION      AMBULATORY NON FORMULARY MEDICATION      AMBULATORY NON FORMULARY MEDICATION      AMBULATORY NON FORMULARY MEDICATION      Budesonide 9 MG TB24      Difluprednate 0.05 % EMUL      LORazepam (ATIVAN) 1 MG tablet      oxyCODONE (OXY IR/ROXICODONE) 5 MG immediate release tablet      Saw Palmetto 450 MG CAPS         ALLERGIES:   Allergies  Allergen Reactions  . Ace Inhibitors Other (See Comments)  Cant remember  . Advil [Ibuprofen] Other (See Comments)    Muscle tightness  . Aspirin Nausea And Vomiting  . Tylenol [Acetaminophen] Other (See Comments)    Muscle tightness    BRIEF HPI:  See H&P, Labs, Consult and Test reports for all details in brief, patient is a 69 year old male with a history of ulcerative colitis, colon cancer status post colectomy was brought to the hospital by the police department as family about IVC paperwork. Patient was found by family in bed covered with feces and urine. He refused to seek medical treatment. He was subsequently admitted to the hospitalist service after he was found to have left lower  extremity cellulitis  CONSULTATIONS:   psychiatry  PERTINENT RADIOLOGIC STUDIES: No results found.   PERTINENT LAB RESULTS: CBC:  Recent Labs  08/24/14 0600  WBC 9.3  HGB 11.6*  HCT 36.1*  PLT 366   CMET CMP     Component Value Date/Time   NA 141 08/25/2014 0515   K 3.6 08/25/2014 0515   CL 105 08/25/2014 0515   CO2 29 08/25/2014 0515   GLUCOSE 95 08/25/2014 0515   BUN 8 08/25/2014 0515   CREATININE 0.94 08/25/2014 0515   CREATININE 0.90 03/05/2013 1021   CALCIUM 8.9 08/25/2014 0515   PROT 6.6 08/23/2014 0427   ALBUMIN 2.9* 08/23/2014 0427   AST 82* 08/23/2014 0427   ALT 61* 08/23/2014 0427   ALKPHOS 60 08/23/2014 0427   BILITOT 1.0 08/23/2014 0427   GFRNONAA 83* 08/25/2014 0515   GFRAA >90 08/25/2014 0515    GFR Estimated Creatinine Clearance: 70.7 mL/min (by C-G formula based on Cr of 0.94). No results for input(s): LIPASE, AMYLASE in the last 72 hours. No results for input(s): CKTOTAL, CKMB, CKMBINDEX, TROPONINI in the last 72 hours. Invalid input(s): POCBNP No results for input(s): DDIMER in the last 72 hours. No results for input(s): HGBA1C in the last 72 hours. No results for input(s): CHOL, HDL, LDLCALC, TRIG, CHOLHDL, LDLDIRECT in the last 72 hours. No results for input(s): TSH, T4TOTAL, T3FREE, THYROIDAB in the last 72 hours.  Invalid input(s): FREET3 No results for input(s): VITAMINB12, FOLATE, FERRITIN, TIBC, IRON, RETICCTPCT in the last 72 hours. Coags: No results for input(s): INR in the last 72 hours.  Invalid input(s): PT Microbiology: Recent Results (from the past 240 hour(s))  Blood culture (routine x 2)     Status: None (Preliminary result)   Collection Time: 08/22/14 10:40 PM  Result Value Ref Range Status   Specimen Description BLOOD RIGHT ARM  Final   Special Requests BOTTLES DRAWN AEROBIC AND ANAEROBIC 5CC  Final   Culture   Final           BLOOD CULTURE RECEIVED NO GROWTH TO DATE CULTURE WILL BE HELD FOR 5 DAYS BEFORE ISSUING A  FINAL NEGATIVE REPORT Performed at [facility]    Report Status PENDING  Incomplete  Blood culture (routine x 2)     Status: None (Preliminary result)   Collection Time: 08/22/14 10:50 PM  Result Value Ref Range Status   Specimen Description BLOOD LEFT ARM  Final   Special Requests BOTTLES DRAWN AEROBIC AND ANAEROBIC 5CC  Final   Culture   Final           BLOOD CULTURE RECEIVED NO GROWTH TO DATE CULTURE WILL BE HELD FOR 5 DAYS BEFORE ISSUING A FINAL NEGATIVE REPORT Performed at [facility]    Report Status PENDING  Incomplete  Urine culture  Status: None   Collection Time: 08/22/14 11:22 PM  Result Value Ref Range Status   Specimen Description URINE, CATHETERIZED  Final   Special Requests NONE  Final   Colony Count NO GROWTH Performed at [facility]   Final   Culture NO GROWTH Performed at [facility]   Final   Report Status 08/24/2014 FINAL  Final  Clostridium Difficile by PCR     Status: None   Collection Time: 08/23/14  8:33 AM  Result Value Ref Range Status   C difficile by pcr NEGATIVE NEGATIVE Final     BRIEF HOSPITAL COURSE:     Left lower extremity cellulitis: Admitted and started on intravenous vancomycin.No further erythema in the left subsequently  transitioned to Doxycycline, which the patient has completed a course. Leg is completely back to normal without any erythema. No f fever or leukocytosis. Continue to monitor clinically, patient will not be discharged on any further antimicrobial treatment.    Sepsis: Secondary to above.Resolved.Blood cultures negative.    Diarrhea: Has history of ulcerative colitis. Stool negative for C. difficile PCR.Suspect from bowel surgery and UC.   Hypokalemia:repleted.Secondary to above. Please continue to monitor electrolytes periodically   History of ulcerative colitis: Continue sulfasalazine and Canasa. Has diarrhea, suspect has chronic diarrhea at baseline. . Patient claims he  has approximately around 4 bowel movements on her regular basis-he currently is back to his usual baseline. Recent sigmoidoscopy in January 2016 showed active proctitis.Please ensure patient has follow-up with his primary gastroenterologist within 2-3 weeks after stabilization of psychiatric issues..   Colon cancer: Status post subtotal colectomy with either rectal anastomosis.   Psychosis: Unfortunately found by family in the house-covered with feces and urine, apparently has not had a shower for the past 1 week prior to admission. Family filed IVC, patient was brought to the emergency room by the police department. Appreciate psychiatry consult. Started on Zyprexa. Plans are to transfer to inpatient psychiatric unit when bed is available. Medically stable for transfer.   Severe Malnutrition: continue supplements    TODAY-DAY OF DISCHARGE:  Subjective:   Robert Rhodes today has no headache,no chest abdominal pain,no new weakness tingling or numbness, feels much better wants to go home today.   Objective:   Blood pressure 126/66, pulse 80, temperature 98.2 F (36.8 C), temperature source Oral, resp. rate 18, height 5' 8"  (1.727 m), weight 68.811 kg (151 lb 11.2 oz), SpO2 100 %.  Intake/Output Summary (Last 24 hours) at 08/26/14 0850 Last data filed at 08/25/14 2235  Gross per 24 hour  Intake 1016.25 ml  Output    950 ml  Net  66.25 ml   Filed Weights   08/22/14 2335  Weight: 68.811 kg (151 lb 11.2 oz)    Exam Awake Alert, Oriented *3, No new F.N deficits, Normal affect [place].AT,PERRAL Supple Neck,No JVD, No cervical lymphadenopathy appriciated.  Symmetrical Chest wall movement, Good air movement bilaterally, CTAB RRR,No Gallops,Rubs or new Murmurs, No Parasternal Heave +ve B.Sounds, Abd Soft, Non tender, No organomegaly appriciated, No rebound -guarding or rigidity. No Cyanosis, Clubbing or edema, No new Rash or bruise  DISCHARGE  CONDITION: Stable  DISPOSITION: Inpatient psychiatry  DISCHARGE INSTRUCTIONS:    Activity:  As tolerated with Full fall precautions use walker/cane & assistance as needed  Diet recommendation: Heart Healthy diet  Discharge Instructions    Call MD for:  persistant dizziness or light-headedness    Complete by:  As directed      Diet - low  sodium heart healthy    Complete by:  As directed      Increase activity slowly    Complete by:  As directed            Follow-up Information    Follow up with Angelica Chessman, MD. Schedule an appointment as soon as possible for a visit in 2 weeks.   Specialty:  Internal Medicine   Contact information:   [place] [address] [phone]       Follow up with PYRTLE, Lajuan Lines, MD. Schedule an appointment as soon as possible for a visit in 1 month.   Specialty:  Gastroenterology   Contact information:   [address] [address] [phone]         Total Time spent on discharge equals 45 minutes.  SignedOren Binet 08/26/2014 8:50 AM

## 2014-08-27 DIAGNOSIS — R410 Disorientation, unspecified: Secondary | ICD-10-CM

## 2014-08-27 MED ORDER — DOXYCYCLINE HYCLATE 100 MG PO TABS: 100.0000 mg | ORAL_TABLET | Freq: Two times a day (BID) | ORAL | Status: DC

## 2014-08-27 MED ORDER — DOXYCYCLINE HYCLATE 100 MG PO TABS
100.0000 mg | ORAL_TABLET | Freq: Two times a day (BID) | ORAL | Status: DC
  Filled 2014-08-27: qty 1

## 2014-08-27 NOTE — Progress Notes (Signed)
PROGRESS NOTE  Robert Rhodes VZS:[id number] DOB: 07-Nov-1945 DOA: 08/22/2014 PCP: Angelica Chessman, MD  Assessment/Plan:  Left lower extremity cellulitis: Admitted and started on intravenous vancomycin.No further erythema in the left subsequently transitioned to Doxycycline, which the patient has completed a course. Leg is completely back to normal without any erythema. No f fever or leukocytosis. Continue to monitor clinically,  -home with doxycycline 164m bid x 5 days to complete one week of therapy   Sepsis: Secondary to above.Resolved.Blood cultures negative.    Diarrhea: Has history of ulcerative colitis. Stool negative for C. difficile PCR.Suspect from bowel surgery and UC.   Hypokalemia:repleted.Secondary to above. Please continue to monitor electrolytes periodically   History of ulcerative colitis: Continue sulfasalazine and Canasa. Has diarrhea, suspect has chronic diarrhea at baseline. . Patient claims he has approximately around 4 bowel movements on her regular basis-he currently is back to his usual baseline. Recent sigmoidoscopy in January 2016 showed active proctitis.Please ensure patient has follow-up with his primary gastroenterologist within 2-3 weeks after stabilization of psychiatric issues..   Colon cancer: Status post subtotal colectomy with either rectal anastomosis.   Psychosis: Unfortunately found by family in the house-covered with feces and urine, apparently has not had a shower for the past 1 week prior to admission. Family filed IVC, patient was brought to the emergency room by the police department. Appreciate psychiatry consult. Started on Zyprexa. Plans are to transfer to inpatient psychiatric unit when bed is available. Medically stable for transfer. 08/27/14--case was discussed with psychiatry,  Dr. JLouretta Shortenwhom cleared the patient for discharge home as long as pt is going home with niece -discussed with niece--she is willing to  take patient home and feels she could convince him to perform daily hygiene tasks and could ensure he followed up with doctor appointments. Niece confirms no current outpatient follow up but reports she could make sure that patient is compliant with treatment -home with zyprexa 5 mg daily   Severe Malnutrition: continue supplements  Family Communication:  Niece updated at bedside Disposition Plan:   Home when medically stable     Procedures/Studies: No results found.      Subjective: Patient denies fevers, chills, headache, chest pain, dyspnea, nausea, vomiting, diarrhea, abdominal pain, dysuria, hematuria   Objective: Filed Vitals:   08/26/14 1300 08/26/14 2048 08/27/14 0635 08/27/14 1346  BP: 128/65 146/77 128/70 137/76  Pulse: 74 107 101 91  Temp: 98 F (36.7 C) 98.7 F (37.1 C) 97.9 F (36.6 C) 97.9 F (36.6 C)  TempSrc: Oral Oral Oral Oral  Resp: 16 18 18 20   Height:      Weight:      SpO2: 99% 100% 100% 98%    Intake/Output Summary (Last 24 hours) at 08/27/14 1439 Last data filed at 08/27/14 1347  Gross per 24 hour  Intake    840 ml  Output    200 ml  Net    640 ml   Weight change:  Exam:   General:  Pt is alert, follows commands appropriately, not in acute distress  HEENT: No icterus, No thrush, No neck mass, [place]/AT  Cardiovascular: RRR, S1/S2, no rubs, no gallops  Respiratory: CTA bilaterally, no wheezing, no crackles, no rhonchi  Abdomen: Soft/+BS, non tender, non distended, no guarding  Extremities: No edema, No lymphangitis, No petechiae, No rashes, no synovitis  Data Reviewed: Basic Metabolic Panel:  Recent Labs Lab 08/22/14 2244 08/23/14 0427 08/24/14 0600 08/25/14 0515  NA  137 135 141 141  K 3.0* 3.1* 3.2* 3.6  CL 95* 98 104 105  CO2 28 29 28 29   GLUCOSE 101* 106* 95 95  BUN 20 16 11 8   CREATININE 1.22 1.07 0.87 0.94  CALCIUM 9.0 8.5 8.4 8.9  MG  --  2.5  --   --   PHOS  --  2.7  --   --    Liver Function Tests:  Recent  Labs Lab 08/22/14 2244 08/23/14 0427  AST 91* 82*  ALT 68* 61*  ALKPHOS 71 60  BILITOT 1.1 1.0  PROT 7.7 6.6  ALBUMIN 3.3* 2.9*   No results for input(s): LIPASE, AMYLASE in the last 168 hours. No results for input(s): AMMONIA in the last 168 hours. CBC:  Recent Labs Lab 08/22/14 2244 08/23/14 0427 08/24/14 0600  WBC 14.6* 13.8* 9.3  HGB 14.3 12.6* 11.6*  HCT 42.7 38.6* 36.1*  MCV 86.3 86.2 87.0  PLT 368 334 366   Cardiac Enzymes: No results for input(s): CKTOTAL, CKMB, CKMBINDEX, TROPONINI in the last 168 hours. BNP: Invalid input(s): POCBNP CBG: No results for input(s): GLUCAP in the last 168 hours.  Recent Results (from the past 240 hour(s))  Blood culture (routine x 2)     Status: None (Preliminary result)   Collection Time: 08/22/14 10:40 PM  Result Value Ref Range Status   Specimen Description BLOOD RIGHT ARM  Final   Special Requests BOTTLES DRAWN AEROBIC AND ANAEROBIC 5CC  Final   Culture   Final           BLOOD CULTURE RECEIVED NO GROWTH TO DATE CULTURE WILL BE HELD FOR 5 DAYS BEFORE ISSUING A FINAL NEGATIVE REPORT Performed at [facility]    Report Status PENDING  Incomplete  Blood culture (routine x 2)     Status: None (Preliminary result)   Collection Time: 08/22/14 10:50 PM  Result Value Ref Range Status   Specimen Description BLOOD LEFT ARM  Final   Special Requests BOTTLES DRAWN AEROBIC AND ANAEROBIC 5CC  Final   Culture   Final           BLOOD CULTURE RECEIVED NO GROWTH TO DATE CULTURE WILL BE HELD FOR 5 DAYS BEFORE ISSUING A FINAL NEGATIVE REPORT Performed at [facility]    Report Status PENDING  Incomplete  Urine culture     Status: None   Collection Time: 08/22/14 11:22 PM  Result Value Ref Range Status   Specimen Description URINE, CATHETERIZED  Final   Special Requests NONE  Final   Colony Count NO GROWTH Performed at [facility]   Final   Culture NO GROWTH Performed at [facility]   Final    Report Status 08/24/2014 FINAL  Final  Clostridium Difficile by PCR     Status: None   Collection Time: 08/23/14  8:33 AM  Result Value Ref Range Status   C difficile by pcr NEGATIVE NEGATIVE Final     Scheduled Meds: . cholecalciferol  1,000 Units Oral Daily  . doxycycline  100 mg Oral Q12H  . enoxaparin (LOVENOX) injection  40 mg Subcutaneous Q24H  . famotidine  20 mg Oral BID  . feeding supplement (RESOURCE BREEZE)  1 Container Oral TID BM  . mesalamine  1,000 mg Rectal QHS  . multivitamin with minerals  1 tablet Oral Q1200  . OLANZapine  5 mg Oral QHS  . sodium chloride  3 mL Intravenous Q12H  . sulfaSALAzine  1,000 mg  Oral QID  . thiamine  100 mg Oral Daily   Continuous Infusions:    Tonjia Parillo, DO  [facility] Pager [phone]  If 7PM-7AM, please contact night-coverage [email or web address] Password [email or web address] 08/27/2014, 2:39 PM   LOS: 4 days

## 2014-08-27 NOTE — Consult Note (Signed)
Psychiatry Consult Follow up  Reason for Consult:  Psychosis, capacity Referring Physician:  Dr. Sloan Leiter Patient Identification: Robert Rhodes MRN:  [id number] Principal Diagnosis: <principal problem not specified> Diagnosis:   Patient Active Problem List   Diagnosis Date Noted  . Protein-calorie malnutrition, severe [E43] 08/24/2014  . Cellulitis [L03.90] 08/23/2014  . Medically noncompliant [Z91.19] 08/23/2014  . Left leg cellulitis [W62.035] 08/23/2014  . Cellulitis of left leg [L03.116]   . Hypokalemia [E87.6]   . Diarrhea [R19.7] 05/06/2014  . Ulcerative colitis [K51.90] 04/17/2014  . Verrucous keratosis [L98.8] 04/17/2014  . Nonspecific (abnormal) findings on radiological and other examination of gastrointestinal tract [R93.3] 07/18/2013  . Acute esophagitis [K20.9] 07/18/2013  . Colon cancer [C18.9] 07/16/2013  . Hypovitaminosis D [E55.9] 05/06/2013  . Ulcerative colitis, unspecified [K51.90] 04/04/2013  . Knee pain, bilateral [M25.561, M25.562] 03/05/2013    Total Time spent with patient: 20 minutes  Subjective:   Robert Rhodes is a 69 y.o. male patient admitted with cellulitis and confusion  HPI:  This patient is a 69 year old black male who lives with his niece and his brother in [place]. He was admitted under involuntary petition filed by his family because he refused to take care of himself. He was in bed covered in feces and urine and refused to go to a doctor. He has been diagnosed with cellulitis and sepsis and is currently receiving IV antibiotics. He was found disheveled on the floor at home and was not eating  or sleeping. The patient has a history of ulcerative colitis and had colon cancer in January 2015 found to be adenocarcinoma and had a subtotal colectomy in March 2015. In January 2015 he had an MRI that showed atypical hepatic lesion is supposed to have this reevaluated but he has been refusing the MRI. He tells me today however that he is going to be  having it on March 17.  On interview the patient tends to ramble and is focusing on "his spiritual life" I attempted to contact his brother and niece and they state that he is Rastafarian and these concepts are part of his religious views. The niece indicates that he was committed to some sort of psychiatric facility in 2012 in [place] but doesn't know anything more about it. He denies auditory or visual hallucinations but seems somewhat disorganized in his thinking. He denies suicidal or homicidal ideation. He admits that he is a vegetarian only eats one meal a day. According to the notes he seems more organized and coherent than he did on the first day and much of his confusion may have been secondary to the sepsis. We can continue to keep a close eye on him and perhaps use a low-dose antipsychotic at bedtime. He denies the use of drugs or alcohol but used to smoke marijuana up until about 15 years ago. Patient seen again today, 08/24/14. He seems more coherent and alert. He claims that he is eating well and sleeping well. Zyprexa was started yesterday. He has many fixed ideas about how to live which include fasting at times as part of his religion. I doubt that these will change. He denies thoughts of suicide or harm to others or auditory or visual hallucinations  Interval history: Patient seen today and case discussed with Staff RN for psychiatric consultation follow-up. Seen the updated note from Sindy Messing, [facility] [place] and communicated with Eduard Clos, social service. He has showed clinical improvement and wishes to be discharged home. Patient appeared lying down in his bed. He has  been doing well without irritability, agitation and aggressive behaviors. He has been compliant with his medication and improved sleep and appetite. He is willing to follow up with out patient care and his niece care take him to out patient care. He believes exorcism, delusions and spiritual life. He has no signs of dementia  or delirium. He has intact cognitions including orientation, memory and language functions.  Past Medical History:  Past Medical History  Diagnosis Date  . Vitamin D deficiency   . UC (ulcerative colitis)   . Arthritis   . Osteoporosis   . GERD (gastroesophageal reflux disease)   . Colon cancer   . Eye abnormalities     right eye seen in ED 02/17/2014 wearing eye patch using eye drops  . Claustrophobia   . Adenomatous colon polyp     Past Surgical History  Procedure Laterality Date  . Ventral hernia repair    . Eus N/A 07/18/2013    Procedure: UPPER ENDOSCOPIC ULTRASOUND (EUS) RADIAL;  Surgeon: Milus Banister, MD;  Location: [facility];  Service: Endoscopy;  Laterality: N/A;  . Intraocular lens insertion Bilateral     6 yrs ago  . Colon resection N/A 08/23/2013    Procedure: Laparoscopic total abdominal colectomy and hernia repair;  Surgeon: Leighton Ruff, MD;  Location: [facility];  Service: General;  Laterality: N/A;  . Colon surgery    . Eye surgery    . Hernia repair    . Incisional hernia repair N/A 02/20/2014    Procedure: LAP ASSISTED INCISIONAL HERNIA REPAIR LYSIS OF ADHESIONS;  Surgeon: Leighton Ruff, MD;  Location: [facility];  Service: General;  Laterality: N/A;  converted to open @ 0935   . Incisional hernia repair N/A 02/20/2014    Procedure: HERNIA REPAIR INCISIONAL;  Surgeon: Leighton Ruff, MD;  Location: [facility];  Service: General;  Laterality: N/A;  With MESH  . Colonoscopy     Family History:  Family History  Problem Relation Age of Onset  . Cancer Mother     type unknown  . Diabetes Father   . Heart disease Father   . Heart disease Sister   . Heart attack Brother   . Colon cancer Neg Hx   . Esophageal cancer Neg Hx   . Rectal cancer Neg Hx   . Stomach cancer Neg Hx    Social History:  History  Alcohol Use No     History  Drug Use  . Yes  . Special: Marijuana    Comment: quit in 2005    History   Social History  . Marital Status: Single    Spouse  Name: N/A  . Number of Children: 0  . Years of Education: N/A   Occupational History  . retired    Social History Main Topics  . Smoking status: Former Smoker -- 1.00 packs/day    Types: Cigarettes    Quit date: 06/14/2003  . Smokeless tobacco: Never Used     Comment: quit in July 2005  . Alcohol Use: No  . Drug Use: Yes    Special: Marijuana     Comment: quit in 2005  . Sexual Activity: Yes   Other Topics Concern  . None   Social History Narrative   Additional Social History:      Allergies:   Allergies  Allergen Reactions  . Ace Inhibitors Other (See Comments)    Cant remember  . Advil [Ibuprofen] Other (See Comments)    Muscle tightness  . Aspirin  Nausea And Vomiting  . Tylenol [Acetaminophen] Other (See Comments)    Muscle tightness    Vitals: Blood pressure 137/76, pulse 91, temperature 97.9 F (36.6 C), temperature source Oral, resp. rate 20, height 5' 8"  (1.727 m), weight 68.811 kg (151 lb 11.2 oz), SpO2 98 %.  Risk to Self: Is patient at risk for suicide?: No Risk to Others:   Prior Inpatient Therapy:   Prior Outpatient Therapy:    Current Facility-Administered Medications  Medication Dose Route Frequency Provider Last Rate Last Dose  . cholecalciferol (VITAMIN D) tablet 1,000 Units  1,000 Units Oral Daily Toy Baker, MD   1,000 Units at 08/27/14 0917  . enoxaparin (LOVENOX) injection 40 mg  40 mg Subcutaneous Q24H Toy Baker, MD   40 mg at 08/27/14 0917  . famotidine (PEPCID) tablet 20 mg  20 mg Oral BID Toy Baker, MD   20 mg at 08/27/14 0917  . feeding supplement (RESOURCE BREEZE) (RESOURCE BREEZE) liquid 1 Container  1 Container Oral TID BM Dorann Ou, RD   1 Container at 08/27/14 1314  . HYDROcodone-acetaminophen (NORCO/VICODIN) 5-325 MG per tablet 1-2 tablet  1-2 tablet Oral Q4H PRN Toy Baker, MD   1 tablet at 08/24/14 0140  . mesalamine (CANASA) suppository 1,000 mg  1,000 mg Rectal QHS Jonetta Osgood, MD    1,000 mg at 08/26/14 2331  . multivitamin with minerals tablet 1 tablet  1 tablet Oral Q1200 Minda Ditto, RPH   1 tablet at 08/27/14 1314  . OLANZapine (ZYPREXA) tablet 5 mg  5 mg Oral QHS Cloria Spring, MD   5 mg at 08/26/14 2331  . sodium chloride 0.9 % injection 3 mL  3 mL Intravenous Q12H Toy Baker, MD   3 mL at 08/27/14 0922  . sulfaSALAzine (AZULFIDINE) tablet 1,000 mg  1,000 mg Oral QID Toy Baker, MD   1,000 mg at 08/27/14 1313  . thiamine (VITAMIN B-1) tablet 100 mg  100 mg Oral Daily Toy Baker, MD   100 mg at 08/27/14 0917    Musculoskeletal: Strength & Muscle Tone: within normal limits Gait & Station: normal Patient leans: N/A  Psychiatric Specialty Exam: Physical Exam  Psychiatric: He has a normal mood and affect. His speech is normal and behavior is normal. Thought content is delusional. Cognition and memory are impaired. He expresses inappropriate judgment.    ROS  Blood pressure 137/76, pulse 91, temperature 97.9 F (36.6 C), temperature source Oral, resp. rate 20, height 5' 8"  (1.727 m), weight 68.811 kg (151 lb 11.2 oz), SpO2 98 %.Body mass index is 23.07 kg/(m^2).  General Appearance: Bizarre and Casual  Eye Contact::  Fair  Speech:  Garbled  Volume:  Decreased  Mood:improved  Affect: brighter  Thought Process:  Coherent and Goal Directed  Orientation:  Full (Time, Place, and Person)  Thought Content:  Delusions  Suicidal Thoughts:  No  Homicidal Thoughts:  No  Memory:  Immediate;   Fair Recent;   Fair Remote;   Fair  Judgement:  Fair  Insight:  Fair  Psychomotor Activity: decreased  Concentration:  Good  Recall:  Good  Fund of Knowledge:Good  Language: Good  Akathisia:  Negative  Handed:  Right  AIMS (if indicated):     Assets:  Communication Skills Desire for Improvement Financial Resources/Insurance Housing Intimacy Leisure Time Physical Health Resilience Social Support Transportation  ADL's:  Impaired   Cognition: WNL  Sleep:      Medical Decision Making: New problem, with additional  work up planned, Review of Quarry manager (1), Decision to obtain old records (1) and Review and summation of old records (2)  Treatment Plan Summary: Daily contact with patient to assess and evaluate symptoms and progress in treatment and Medication management  Plan: Case discussed with Dr. Carles Collet regarding change in his plan from IP to Out patient care. Rescind IVC petition as patient has showed clinical improvement Continue Zyprexa 5 mg by mouth at bedtime. Patient does not meet criteria for psychiatric inpatient admission. Supportive therapy provided about ongoing stressors.  Appreciate psychiatric consultation Please contact [phone] or [phone] if needs further assistance   Disposition: Refer him to out patient psychiatric treatment and discharge to his family.   Vaniah Chambers,JANARDHAHA R. 08/27/2014 2:05 PM

## 2014-08-27 NOTE — Progress Notes (Signed)
CSW was notified by nurse that pt's IVC paper work needed to be rescinded. CSW filled out the required document and discussed pt with MD, Tat. Physician signed the document.  Nurse states that pt is medically clear and ready to be discharged. Patients niece is currently on 4th floor. She states that she is waiting to pick up.  CSW faxed pt rescinded IVC paperwork. However, he stated that he did not need it. CSW placed paperwork in the orange IVC folder.   Willette Brace [phone] ED CSW 08/27/2014 7:17 PM

## 2014-08-27 NOTE — Clinical Social Work Note (Signed)
CSW spoke with Robert Rhodes at [facility] and they are reviewing patient for possible admission- anticipate beds available later today or tomorrow- will advise and look at other options as well.  Eduard Clos, MSW, [place]

## 2014-08-28 ENCOUNTER — Inpatient Hospital Stay: Admission: RE | Admit: 2014-08-28 | Payer: Medicare HMO | Source: Ambulatory Visit

## 2014-08-29 LAB — CULTURE, BLOOD (ROUTINE X 2)
Culture: NO GROWTH
Culture: NO GROWTH

## 2014-09-09 ENCOUNTER — Inpatient Hospital Stay: Payer: Self-pay | Admitting: Internal Medicine

## 2014-09-18 ENCOUNTER — Ambulatory Visit
Admission: RE | Admit: 2014-09-18 | Discharge: 2014-09-18 | Disposition: A | Discharge status: Home / Self Care | Payer: Commercial Managed Care - HMO | Source: Ambulatory Visit | Attending: Internal Medicine | Admitting: Internal Medicine

## 2014-09-18 DIAGNOSIS — R935 Abnormal findings on diagnostic imaging of other abdominal regions, including retroperitoneum: Secondary | ICD-10-CM

## 2014-10-09 ENCOUNTER — Ambulatory Visit: Payer: Self-pay | Admitting: Internal Medicine

## 2014-10-22 ENCOUNTER — Telehealth: Payer: Self-pay | Admitting: *Deleted

## 2014-10-22 NOTE — Telephone Encounter (Signed)
I have attempted to contact patient on both numbers provided in system to see if he would be willing to instead have CT scan completed (in place of MRI). I am unable to get an answer or leave a voicemail. I will send him a letter.

## 2014-10-22 NOTE — Telephone Encounter (Signed)
-----   Message from Larina Bras, [place] sent at 10/22/2014  8:54 AM EDT ----- ===View-only below this line===  ----- Message -----    From: Jerene Bears, MD    Sent: 10/21/2014  12:31 PM      To: Larina Bras, CMA  Just document it, see if he would be willing for CT with contrast, liver protocol ----- Message -----    From: Larina Bras, CMA    Sent: 10/21/2014  12:08 PM      To: Jerene Bears, MD  He has been scheduled for OPEN mri on multiple occasions and has been advised to do so. Any other suggestions on getting him to come?  ----- Message -----    From: Jerene Bears, MD    Sent: 10/21/2014  11:56 AM      To: Larina Bras, CMA  MRI canceled due to claustrophobia Needs open MRI abd as recommended previously Apparently canceled test 4 times???

## 2014-11-20 ENCOUNTER — Ambulatory Visit: Payer: Self-pay | Admitting: Internal Medicine

## 2014-11-27 ENCOUNTER — Encounter: Payer: Self-pay | Admitting: Internal Medicine

## 2014-11-27 ENCOUNTER — Ambulatory Visit: Payer: Commercial Managed Care - HMO | Attending: Internal Medicine | Admitting: Internal Medicine

## 2014-11-27 VITALS — BP 160/80 | HR 102 | Temp 98.0°F | Resp 16 | Ht 69.0 in | Wt 176.4 lb

## 2014-11-27 DIAGNOSIS — K51918 Ulcerative colitis, unspecified with other complication: Secondary | ICD-10-CM | POA: Diagnosis not present

## 2014-11-27 DIAGNOSIS — K429 Umbilical hernia without obstruction or gangrene: Secondary | ICD-10-CM

## 2014-11-27 DIAGNOSIS — F3162 Bipolar disorder, current episode mixed, moderate: Secondary | ICD-10-CM | POA: Diagnosis not present

## 2014-11-27 DIAGNOSIS — K469 Unspecified abdominal hernia without obstruction or gangrene: Secondary | ICD-10-CM | POA: Insufficient documentation

## 2014-11-27 LAB — BASIC METABOLIC PANEL
BUN: 13 mg/dL (ref 6–23)
CO2: 26 mEq/L (ref 19–32)
Calcium: 9 mg/dL (ref 8.4–10.5)
Chloride: 108 mEq/L (ref 96–112)
Creat: 0.82 mg/dL (ref 0.50–1.35)
Glucose, Bld: 79 mg/dL (ref 70–99)
Potassium: 3.5 mEq/L (ref 3.5–5.3)
Sodium: 143 mEq/L (ref 135–145)

## 2014-11-27 MED ORDER — FAMOTIDINE 20 MG PO TABS: 20.0000 mg | ORAL_TABLET | Freq: Two times a day (BID) | ORAL | Status: DC

## 2014-11-27 MED ORDER — THIAMINE HCL 100 MG PO TABS: 100.0000 mg | ORAL_TABLET | Freq: Every day | ORAL | Status: DC

## 2014-11-27 NOTE — Progress Notes (Signed)
Patient ID: Robert Rhodes, male   DOB: 01-30-1946, 69 y.o.   MRN: [id number]   Robert Rhodes, is a 69 y.o. male  AJO:[id number]  NOB:[id number]  DOB - 1945-10-26  Chief Complaint  Patient presents with  . Follow-up        Subjective:   Robert Rhodes is a 69 y.o. male here today for a follow up visit. Patient has a history of ulcerative colitis , vitamin D deficiency, GERD , colon cancer status post surgery.  Patient was recently admitted to the hospital for cellulitis of left leg and some concern about hygiene and not taking care of himself. Patient reports that he feels fine today. Patient reports that his umbilical hernia has been given him some pain rated at a 6, and describes it as tender and sensitive to touch. Patient states that the pain comes and goes.  No abdominal distention, no nausea or vomiting , no constipation, no diarrhea , no hematochezia. Patient was talkative , describing spirits and a bright shinning light that he has been fighting for close to 3 months,  He claims his in controlled though. he denies being depressed, she denies any suicidal ideation or thoughts. Patient has No headache, No chest pain, No Nausea, No new weakness tingling or numbness, No Cough - SOB.  Problem  Hernia of Abdominal Cavity  Bipolar 1 Disorder, Mixed, Moderate    ALLERGIES: Allergies  Allergen Reactions  . Ace Inhibitors Other (See Comments)    Cant remember  . Advil [Ibuprofen] Other (See Comments)    Muscle tightness  . Aspirin Nausea And Vomiting  . Tylenol [Acetaminophen] Other (See Comments)    Muscle tightness    PAST MEDICAL HISTORY: Past Medical History  Diagnosis Date  . Vitamin D deficiency   . UC (ulcerative colitis)   . Arthritis   . Osteoporosis   . GERD (gastroesophageal reflux disease)   . Colon cancer   . Eye abnormalities     right eye seen in ED 02/17/2014 wearing eye patch using eye drops  . Claustrophobia   . Adenomatous colon polyp     MEDICATIONS  AT HOME: Prior to Admission medications   Medication Sig Start Date End Date Taking? Authorizing Provider  cholecalciferol (VITAMIN D) 1000 UNITS tablet Take 1,000 Units by mouth daily.   Yes Historical Provider, MD  famotidine (PEPCID) 20 MG tablet Take 1 tablet (20 mg total) by mouth 2 (two) times daily. 11/27/14  Yes Tresa Garter, MD  Multiple Vitamin (MULTIVITAMIN WITH MINERALS) TABS tablet Take 1 tablet by mouth daily at 12 noon. 08/26/14  Yes Shanker Kristeen Mans, MD  OLANZapine (ZYPREXA) 5 MG tablet Take 1 tablet (5 mg total) by mouth at bedtime. 08/26/14  Yes Shanker Kristeen Mans, MD  sulfaSALAzine (AZULFIDINE) 500 MG tablet Take 2 tablets (1,000 mg total) by mouth 4 (four) times daily. 05/06/14  Yes Jessica D Zehr, PA-C  thiamine 100 MG tablet Take 1 tablet (100 mg total) by mouth daily. 11/27/14  Yes Tresa Garter, MD  vitamin A 10000 UNIT capsule Take 10,000 Units by mouth daily.   Yes Historical Provider, MD  vitamin E 100 UNIT capsule Take 100 Units by mouth daily.   Yes Historical Provider, MD  cyclobenzaprine (FLEXERIL) 5 MG tablet Take 1 tablet (5 mg total) by mouth every 4 (four) hours as needed for muscle spasms. Patient not taking: Reported on 9/47/6546 10/13/52   Leighton Ruff, MD  doxycycline (VIBRA-TABS) 100 MG tablet Take 1 tablet (100  mg total) by mouth every 12 (twelve) hours. Patient not taking: Reported on 11/27/2014 08/27/14   Orson Eva, MD  feeding supplement, RESOURCE BREEZE, (RESOURCE BREEZE) LIQD Take 1 Container by mouth 3 (three) times daily between meals. Patient not taking: Reported on 11/27/2014 08/26/14   Jonetta Osgood, MD  mesalamine (CANASA) 1000 MG suppository Place 1 suppository (1,000 mg total) rectally at bedtime. Patient not taking: Reported on 08/22/2014 07/08/14   Jerene Bears, MD     Objective:   Filed Vitals:   11/27/14 1507 11/27/14 1511 11/27/14 1532  BP:  181/73 160/80  Pulse:  102   Temp:  98 F (36.7 C)   TempSrc:  Oral   Resp:  16    Height: 5' 9"  (1.753 m)    Weight: 176 lb 6.4 oz (80.015 kg)    SpO2:  97%     Exam General appearance : Awake, alert, not in any distress. Speech Clear. Not toxic looking, unkempt  HEENT: Atraumatic and Normocephalic, pupils equally reactive to light and accomodation Neck: supple, no JVD. No cervical lymphadenopathy.  Chest:Good air entry bilaterally, no added sounds  CVS: S1 S2 regular, no murmurs.  Abdomen: Bowel sounds present, Non tender and not distended with no gaurding, rigidity or rebound. Extremities: B/L Lower Ext shows no edema, both legs are warm to touch Neurology: Awake alert, and oriented X 3, CN II-XII intact, Non focal Skin:No Rash  Data Review No results found for: HGBA1C   Assessment & Plan   1. Umbilical hernia, recurrence not specified  - Basic Metabolic Panel - Follow-up with general surgery gastroenterologist as scheduled  2. Bipolar 1 disorder, mixed, moderate  - Basic Metabolic Panel  3. Ulcerative colitis, other complication  - thiamine 100 MG tablet; Take 1 tablet (100 mg total) by mouth daily.  Dispense: 90 tablet; Refill: 3 - famotidine (PEPCID) 20 MG tablet; Take 1 tablet (20 mg total) by mouth 2 (two) times daily.  Dispense: 180 tablet; Refill: 3  - Patient has been referred to our licensed clinical social worker here in the clinic today for counseling plan for appropriate referral to psychiatrist. Patient have been counseled extensively about nutrition and exercise  Return in about 3 months (around 02/27/2015) for Follow up HTN, Follow up Pain and comorbidities, Abdominal Pain.  The patient was given clear instructions to go to ER or return to medical center if symptoms don't improve, worsen or new problems develop. The patient verbalized understanding. The patient was told to call to get lab results if they haven't heard anything in the next week.   This note has been created with Automotive engineer. Any transcriptional errors are unintentional.    Angelica Chessman, MD, [place], [place], [place], [place] and [place], [place]   11/27/2014, 3:54 PM

## 2014-11-27 NOTE — Progress Notes (Signed)
Patient here for a follow up. Patient reports that he feels fine today. Patient reports that his umbilical hernia has been given him some pain. Pain rated at a 6, and describes it as tender and sensitive to touch. Patient states that the pain comes and goes. Patient pain started 3-4 days ago.

## 2014-11-27 NOTE — Progress Notes (Signed)
ASSESSMENT: Pt unaware of any psychosocial problems he may be experiencing, and is content in his current state of being. Pt needs to continue taking his BH medications and F/U with PCP. Pt would benefit from supportive counseling for emotional and social support, and for continued monitoring.  Stage of Change: precognitive  PLAN: 1. F/U with behavioral health consultant in as needed 2. Psychiatric Medications: Xyprexa. 3. Behavioral recommendation(s):   -Consider calling [facility] as needed -Consider calling Mobile Crisis if needed  SUBJECTIVE: Pt. referred by Dr Jarold Song for psychosocial problems:  Pt. here for referral regarding psychosocial problems.  Pt. reports the following symptoms/concerns: Pt states that he is a spiritual man, that he spends much time in meditation, focusing on the "light" within that is the real world, rather than the material world that so many others focus upon. He states that he is in full control of the insight that comes to him, and that the things he hears and sees are not anything that scare him. He says that he is content with the house in which he lives. He also agrees to call the [facility] and/or the mobile crisis line, if he ever wants to talk further, and/or if the things he hears or sees begin to scare him.  Duration of problem: undetermined number of years Severity: moderate  OBJECTIVE: Orientation & Cognition: Oriented x3. Thought processes normal and appropriate to situation. Mood: appropriate, peaceful Affect: appropriate Appearance: appropriate Risk of harm to self or others: no risk of harm to self or others Substance use: none Psychiatric medication use: Unchanged from prior contact. Assessments administered: risk assessment  Diagnosis: Problems related to psychosocial circumstances CPT Code: Z65.9 -------------------------------------------- Other(s) present in the room: none  Time spent with patient in exam room: 20 minutes

## 2014-11-28 ENCOUNTER — Telehealth: Payer: Self-pay

## 2014-11-28 NOTE — Telephone Encounter (Signed)
Nurse called patient, reached voicemail. Left message for patient to call Chrystopher Stangl at [phone].

## 2014-11-28 NOTE — Telephone Encounter (Signed)
-----   Message from Tresa Garter, MD sent at 11/28/2014  9:17 AM EDT ----- Please inform patient that his laboratory tests result is normal.

## 2014-12-02 DIAGNOSIS — H2013 Chronic iridocyclitis, bilateral: Secondary | ICD-10-CM | POA: Diagnosis not present

## 2014-12-12 NOTE — Telephone Encounter (Signed)
-----   Message from Tresa Garter, MD sent at 11/28/2014  9:17 AM EDT ----- Please inform patient that his laboratory tests result is normal.

## 2014-12-12 NOTE — Telephone Encounter (Signed)
Nurse called patients' home number, reached voicemail. Left message for patient to call Khalif Stender with [facility], at [phone]. Nurse called patients' mobile number, reached automated message explaining no one is available, please try again later.

## 2014-12-16 NOTE — Telephone Encounter (Signed)
-----   Message from Tresa Garter, MD sent at 11/28/2014  9:17 AM EDT ----- Please inform patient that his laboratory tests result is normal.

## 2014-12-16 NOTE — Telephone Encounter (Signed)
Nurse called patient, reached automated recording at home number explaining patient is not accepting incoming calls. Nurse called mobile number, reached automated message explaining patient is not available at this time, please call back later.

## 2014-12-25 NOTE — Telephone Encounter (Signed)
-----   Message from Tresa Garter, MD sent at 11/28/2014  9:17 AM EDT ----- Please inform patient that his laboratory tests result is normal.

## 2014-12-25 NOTE — Telephone Encounter (Signed)
Nurse called patient. Home number is not accepting incoming calls and mobile number kept ringing with no answer or voicemail. Nurse will send letter.

## 2015-03-03 ENCOUNTER — Encounter: Payer: Self-pay | Admitting: Internal Medicine

## 2015-03-06 ENCOUNTER — Ambulatory Visit: Payer: Commercial Managed Care - HMO | Attending: Family Medicine | Admitting: Family Medicine

## 2015-03-06 ENCOUNTER — Encounter: Payer: Self-pay | Admitting: Family Medicine

## 2015-03-06 VITALS — BP 130/73 | HR 79 | Temp 98.2°F | Resp 18 | Ht 69.0 in | Wt 173.0 lb

## 2015-03-06 DIAGNOSIS — K5190 Ulcerative colitis, unspecified, without complications: Secondary | ICD-10-CM | POA: Diagnosis not present

## 2015-03-06 DIAGNOSIS — Z85038 Personal history of other malignant neoplasm of large intestine: Secondary | ICD-10-CM | POA: Insufficient documentation

## 2015-03-06 DIAGNOSIS — D649 Anemia, unspecified: Secondary | ICD-10-CM | POA: Diagnosis not present

## 2015-03-06 NOTE — Assessment & Plan Note (Addendum)
Patient reports he has been followed by Dr Hilarie Fredrickson at [facility] in the past, encouraged to keep his regular annual follow up since colonoscopy January 2015 was colon cancer, s/p surgical resection.  No active symptoms or concerns regarding this.  Does have some mild normocytic anemia on most recent CBC.

## 2015-03-06 NOTE — Progress Notes (Signed)
   Subjective:    Patient ID: Robert Rhodes, male    DOB: 09/06/1945, 69 y.o.   MRN: [id number]  HPI Patient for follow up.  Denies any symptoms or complaints.  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  Is overdue for this and encouraged to have this follow up.   Denies abd pain, fevers or chills, no diarrhea or N/V. No blood in stool   Some nocturia which is unchanged.       Review of Systems     Objective:   Physical Exam Generally well appearing, no distressed HEENT Neck supple, no cervical adenopathy COR Regular S1S2 PULM Clear bilaterally  ABD soft, nondistended.       Assessment & Plan:

## 2015-03-06 NOTE — Progress Notes (Signed)
Patient here for follow up for pain.  Patient denies any pain at the time. Patient states medications have been helping with pain.  Patient has not taken his medications this morning.

## 2015-03-06 NOTE — Patient Instructions (Signed)
It was a pleasure to see you today.   As we discussed, it is important that you follow up with Dr Hilarie Fredrickson for the findings of your colonoscopy last year.    Follow up at [facility] in 3 months.

## 2015-05-05 DIAGNOSIS — Z5181 Encounter for therapeutic drug level monitoring: Secondary | ICD-10-CM | POA: Diagnosis not present

## 2015-05-05 DIAGNOSIS — F1220 Cannabis dependence, uncomplicated: Secondary | ICD-10-CM | POA: Diagnosis not present

## 2015-05-11 DIAGNOSIS — Z5181 Encounter for therapeutic drug level monitoring: Secondary | ICD-10-CM | POA: Diagnosis not present

## 2015-05-11 DIAGNOSIS — F1220 Cannabis dependence, uncomplicated: Secondary | ICD-10-CM | POA: Diagnosis not present

## 2015-05-15 DIAGNOSIS — Z5181 Encounter for therapeutic drug level monitoring: Secondary | ICD-10-CM | POA: Diagnosis not present

## 2015-05-15 DIAGNOSIS — F1220 Cannabis dependence, uncomplicated: Secondary | ICD-10-CM | POA: Diagnosis not present

## 2015-05-18 DIAGNOSIS — Z5181 Encounter for therapeutic drug level monitoring: Secondary | ICD-10-CM | POA: Diagnosis not present

## 2015-05-18 DIAGNOSIS — F1220 Cannabis dependence, uncomplicated: Secondary | ICD-10-CM | POA: Diagnosis not present

## 2015-05-21 DIAGNOSIS — F1220 Cannabis dependence, uncomplicated: Secondary | ICD-10-CM | POA: Diagnosis not present

## 2015-05-21 DIAGNOSIS — Z5181 Encounter for therapeutic drug level monitoring: Secondary | ICD-10-CM | POA: Diagnosis not present

## 2015-05-25 DIAGNOSIS — F1220 Cannabis dependence, uncomplicated: Secondary | ICD-10-CM | POA: Diagnosis not present

## 2015-05-26 DIAGNOSIS — F1920 Other psychoactive substance dependence, uncomplicated: Secondary | ICD-10-CM | POA: Diagnosis not present

## 2015-05-26 DIAGNOSIS — Z5181 Encounter for therapeutic drug level monitoring: Secondary | ICD-10-CM | POA: Diagnosis not present

## 2015-05-28 DIAGNOSIS — Z0289 Encounter for other administrative examinations: Secondary | ICD-10-CM | POA: Diagnosis not present

## 2015-05-28 DIAGNOSIS — F1220 Cannabis dependence, uncomplicated: Secondary | ICD-10-CM | POA: Diagnosis not present

## 2015-05-28 DIAGNOSIS — Z5181 Encounter for therapeutic drug level monitoring: Secondary | ICD-10-CM | POA: Diagnosis not present

## 2015-06-01 ENCOUNTER — Ambulatory Visit: Payer: Self-pay | Admitting: Internal Medicine

## 2015-06-03 DIAGNOSIS — F1220 Cannabis dependence, uncomplicated: Secondary | ICD-10-CM | POA: Diagnosis not present

## 2015-06-03 DIAGNOSIS — Z5181 Encounter for therapeutic drug level monitoring: Secondary | ICD-10-CM | POA: Diagnosis not present

## 2015-06-04 ENCOUNTER — Ambulatory Visit: Payer: Self-pay | Admitting: Internal Medicine

## 2015-06-18 ENCOUNTER — Ambulatory Visit: Payer: Commercial Managed Care - HMO | Attending: Family Medicine | Admitting: Family Medicine

## 2015-06-18 ENCOUNTER — Encounter: Payer: Self-pay | Admitting: Family Medicine

## 2015-06-18 VITALS — BP 148/106 | HR 83 | Temp 98.2°F | Resp 16 | Ht 69.0 in | Wt 197.0 lb

## 2015-06-18 DIAGNOSIS — K219 Gastro-esophageal reflux disease without esophagitis: Secondary | ICD-10-CM | POA: Insufficient documentation

## 2015-06-18 DIAGNOSIS — K5190 Ulcerative colitis, unspecified, without complications: Secondary | ICD-10-CM | POA: Diagnosis not present

## 2015-06-18 DIAGNOSIS — K51911 Ulcerative colitis, unspecified with rectal bleeding: Secondary | ICD-10-CM | POA: Diagnosis not present

## 2015-06-18 DIAGNOSIS — I10 Essential (primary) hypertension: Secondary | ICD-10-CM

## 2015-06-18 DIAGNOSIS — H547 Unspecified visual loss: Secondary | ICD-10-CM

## 2015-06-18 DIAGNOSIS — R197 Diarrhea, unspecified: Secondary | ICD-10-CM

## 2015-06-18 DIAGNOSIS — H539 Unspecified visual disturbance: Secondary | ICD-10-CM | POA: Insufficient documentation

## 2015-06-18 MED ORDER — OMEPRAZOLE 20 MG PO CPDR: 20.0000 mg | DELAYED_RELEASE_CAPSULE | Freq: Every day | ORAL | Status: DC

## 2015-06-18 MED ORDER — AMLODIPINE BESYLATE 2.5 MG PO TABS: 2.5000 mg | ORAL_TABLET | Freq: Every day | ORAL | Status: DC

## 2015-06-18 MED ORDER — SULFASALAZINE 500 MG PO TABS
1000.0000 mg | ORAL_TABLET | Freq: Four times a day (QID) | ORAL | Status: DC
Start: 2015-06-18 — End: 2015-08-06

## 2015-06-18 NOTE — Assessment & Plan Note (Signed)
Patient with history UC and CRC with surgical resection history; now reporting return of crampy abd pain and watery diarrhea. Had some bloody diarrhea a few days ago which has improved with use of

## 2015-06-18 NOTE — Progress Notes (Signed)
Patient's here for f/up HTN, comorbidities, and abdominal pain.   Patient states that he's having pain, rated at 5/10 described as burning sensation, constant.  Patient requesting rx of sulfasalazine, Pepcid. Patient wants something else for his indigestion.  Patient reports not taking his meds today. Patient denies feeling dizzy with elevated BP.

## 2015-06-18 NOTE — Assessment & Plan Note (Signed)
Recent worsening of diarrhea, sometimes with blood. Overdue for GI follow up. Gave short-term refill of sulfasalazine which he has resumed taking for his UC diagnosis; has helped somewhat (no more blood in diarrhea; diarrhea is slowing down).  Urged adherence with GI referral, as he is overdue for his colonoscopy.  He pledges to see Dr Hilarie Fredrickson, his GI doctor, for this.

## 2015-06-18 NOTE — Patient Instructions (Signed)
It was a pleasure to see you today.    Your blood pressure remains above the target range; plan to start low-dose amlodipine 2.27m tablets, one tablet by mouth daily.    I am concerned about your recurrent diarrhea/blood/abdominal pain.  I am recommending a visit with Dr PHilarie Fredricksonand I am reissuing a temporary prescription for the sulfasalazine four times daily.   Referral for ophthalmology Dr ROval Linseyfor the visual changes you experienced recently.   Recheck Blood Pressure with NURSE VISIT in the coming 1-2 weeks; follow up with physician in 1 month.

## 2015-06-18 NOTE — Progress Notes (Signed)
   Subjective:    Patient ID: Robert Rhodes, male    DOB: Jan 24, 1946, 70 y.o.   MRN: [id number]  HPI Mr Deaton is here for follow up; reports that he has had recent worsening of diarrhea, was with blood until a couple of days ago. Abd crampy pain rated at 5/10. Now with watery non-bloody stool, resumed taking sulfasalazine in the past few days (had a few left over).  Has not followed up with Dr Hilarie Fredrickson as we had discussed/planned in Sept visit.  Has had some worsening GERD symptoms, not better with Pepcid (famotidine).  Has not taken PPI recently for this.    Has noticed his weight gain; up 23# in the flow chart. Not sure what to attribute this to.   Had sudden worsening of visual acuity in R eye, with pain, a few days ago.  Had a similar problem last year and saw ophthalmology Dr Oval Linsey [facility]) and had an intraocular injection which improved his vision. The eye pain and decreased visual acuity has resolved, is at baseline today. Is requesting follow up with Dr Oval Linsey (missed a couple of appointments).   ROS: denies fevers or chills, no chest pain or shortness of breath. GI symptoms (diarrhea, blood, GERD) as noted above. No dysuria or changes in urinary habits.    Review of Systems     Objective:   Physical Exam General: Well appearing, no acute distress.  HEENT Neck supple, moist mucus membranes. No cervical adenopathy. Conjunctivae intact and no pallor. Arcus senilis. EOMI. PERRL. Snellen visual acuity checked in visit, recorded in nursing notes. COR Regular S1S2, no extra sounds PULM Clear lung fields bilaterally, no rales or wheezes ABD: Audible bowel sounds; soft, nontender, nondistended. No masses or megaly noted.       Assessment & Plan:

## 2015-06-23 ENCOUNTER — Ambulatory Visit: Payer: Commercial Managed Care - HMO | Attending: Internal Medicine | Admitting: Pharmacist

## 2015-06-23 VITALS — BP 138/76 | HR 80

## 2015-06-23 DIAGNOSIS — I10 Essential (primary) hypertension: Secondary | ICD-10-CM | POA: Diagnosis not present

## 2015-06-23 DIAGNOSIS — Z79899 Other long term (current) drug therapy: Secondary | ICD-10-CM | POA: Diagnosis not present

## 2015-06-23 DIAGNOSIS — H2013 Chronic iridocyclitis, bilateral: Secondary | ICD-10-CM | POA: Diagnosis not present

## 2015-06-23 NOTE — Progress Notes (Signed)
S:    Patient arrives in good spirits.  Presents to the clinic for hypertension evaluation.   Patient reports adherence with medications. He took the amlodipine this morning.   Current BP Medications include:  Amlodipine 2.5 mg daily  Antihypertensives tried in the past include: ACEi (unsure of reaction)  Patient reports that he has felt somewhat dizzy if he stands up too quickly.   O:   Last 3 Office BP readings: BP Readings from Last 3 Encounters:  06/18/15 148/106  03/06/15 130/73  11/27/14 160/80    BMET    Component Value Date/Time   NA 143 11/27/2014 1558   K 3.5 11/27/2014 1558   CL 108 11/27/2014 1558   CO2 26 11/27/2014 1558   GLUCOSE 79 11/27/2014 1558   BUN 13 11/27/2014 1558   CREATININE 0.82 11/27/2014 1558   CREATININE 0.94 08/25/2014 0515   CALCIUM 9.0 11/27/2014 1558   GFRNONAA 83* 08/25/2014 0515   GFRAA >90 08/25/2014 0515    A/P: History of hypertension currently controlled on current medications. Continue amlodipine 2.5 mg daily. For age, blood pressure is at goal of <150/90. Dizziness is likely from adjusting to blood pressure medication but told patient to get up slowly to prevent falls and to return to clinic if the dizziness continues. Patient verbalized understanding.   Medication reconciliation completed. Results reviewed and written information provided.   Total time in face-to-face counseling 10 minutes.  F/U Clinic Visit with Dr. Doreene Burke in 1 month.

## 2015-06-23 NOTE — Patient Instructions (Addendum)
Thank you for coming to see me!  Continue the amlodipine 2.5 mg daily for your blood pressure  Stand up slowly to avoid the dizziness. If it continues, let us know  Follow up with Dr. Doreene Burke

## 2015-06-24 DIAGNOSIS — F1920 Other psychoactive substance dependence, uncomplicated: Secondary | ICD-10-CM | POA: Diagnosis not present

## 2015-06-24 DIAGNOSIS — Z5181 Encounter for therapeutic drug level monitoring: Secondary | ICD-10-CM | POA: Diagnosis not present

## 2015-06-26 DIAGNOSIS — R69 Illness, unspecified: Secondary | ICD-10-CM | POA: Diagnosis not present

## 2015-07-01 DIAGNOSIS — F1220 Cannabis dependence, uncomplicated: Secondary | ICD-10-CM | POA: Diagnosis not present

## 2015-07-01 DIAGNOSIS — Z5181 Encounter for therapeutic drug level monitoring: Secondary | ICD-10-CM | POA: Diagnosis not present

## 2015-07-10 DIAGNOSIS — F1210 Cannabis abuse, uncomplicated: Secondary | ICD-10-CM | POA: Diagnosis not present

## 2015-07-10 DIAGNOSIS — F1410 Cocaine abuse, uncomplicated: Secondary | ICD-10-CM | POA: Diagnosis not present

## 2015-07-13 DIAGNOSIS — Z79899 Other long term (current) drug therapy: Secondary | ICD-10-CM | POA: Diagnosis not present

## 2015-07-13 DIAGNOSIS — Z5181 Encounter for therapeutic drug level monitoring: Secondary | ICD-10-CM | POA: Diagnosis not present

## 2015-07-13 DIAGNOSIS — F1120 Opioid dependence, uncomplicated: Secondary | ICD-10-CM | POA: Diagnosis not present

## 2015-07-20 DIAGNOSIS — F1410 Cocaine abuse, uncomplicated: Secondary | ICD-10-CM | POA: Diagnosis not present

## 2015-08-03 DIAGNOSIS — Z5181 Encounter for therapeutic drug level monitoring: Secondary | ICD-10-CM | POA: Diagnosis not present

## 2015-08-03 DIAGNOSIS — F1120 Opioid dependence, uncomplicated: Secondary | ICD-10-CM | POA: Diagnosis not present

## 2015-08-06 ENCOUNTER — Encounter: Payer: Self-pay | Admitting: Internal Medicine

## 2015-08-06 ENCOUNTER — Ambulatory Visit: Payer: Commercial Managed Care - HMO | Attending: Internal Medicine | Admitting: Internal Medicine

## 2015-08-06 VITALS — BP 148/71 | HR 72 | Temp 98.3°F | Resp 18 | Ht 69.0 in | Wt 197.2 lb

## 2015-08-06 DIAGNOSIS — Z85038 Personal history of other malignant neoplasm of large intestine: Secondary | ICD-10-CM | POA: Diagnosis not present

## 2015-08-06 DIAGNOSIS — Z79899 Other long term (current) drug therapy: Secondary | ICD-10-CM | POA: Insufficient documentation

## 2015-08-06 DIAGNOSIS — K5190 Ulcerative colitis, unspecified, without complications: Secondary | ICD-10-CM | POA: Diagnosis not present

## 2015-08-06 DIAGNOSIS — Z888 Allergy status to other drugs, medicaments and biological substances status: Secondary | ICD-10-CM | POA: Insufficient documentation

## 2015-08-06 DIAGNOSIS — E559 Vitamin D deficiency, unspecified: Secondary | ICD-10-CM | POA: Diagnosis not present

## 2015-08-06 DIAGNOSIS — M1990 Unspecified osteoarthritis, unspecified site: Secondary | ICD-10-CM | POA: Diagnosis not present

## 2015-08-06 DIAGNOSIS — I10 Essential (primary) hypertension: Secondary | ICD-10-CM

## 2015-08-06 DIAGNOSIS — K51911 Ulcerative colitis, unspecified with rectal bleeding: Secondary | ICD-10-CM | POA: Diagnosis not present

## 2015-08-06 DIAGNOSIS — K625 Hemorrhage of anus and rectum: Secondary | ICD-10-CM | POA: Diagnosis not present

## 2015-08-06 DIAGNOSIS — K219 Gastro-esophageal reflux disease without esophagitis: Secondary | ICD-10-CM | POA: Diagnosis not present

## 2015-08-06 DIAGNOSIS — Z87891 Personal history of nicotine dependence: Secondary | ICD-10-CM | POA: Diagnosis not present

## 2015-08-06 MED ORDER — VITAMIN D 1000 UNITS PO TABS: 1000.0000 [IU] | ORAL_TABLET | Freq: Every day | ORAL | Status: DC

## 2015-08-06 MED ORDER — SULFASALAZINE 500 MG PO TABS: 1000.0000 mg | ORAL_TABLET | Freq: Four times a day (QID) | ORAL | Status: DC

## 2015-08-06 MED ORDER — AMLODIPINE BESYLATE 2.5 MG PO TABS: 2.5000 mg | ORAL_TABLET | Freq: Every day | ORAL | Status: DC

## 2015-08-06 MED ORDER — PANTOPRAZOLE SODIUM 40 MG PO TBEC: 40.0000 mg | DELAYED_RELEASE_TABLET | Freq: Every day | ORAL | Status: DC

## 2015-08-06 MED FILL — AMLODIPINE BESYLATE 2.5 MG: 2.5 | 30 days supply | Qty: 30 | Fill #0

## 2015-08-06 MED FILL — PANTOPRAZOLE SOD DR 40 MG T: 40 | 30 days supply | Qty: 30 | Fill #0

## 2015-08-06 MED FILL — sulfaSALAzine 500 MG TABS: 500 | 30 days supply | Qty: 240 | Fill #0

## 2015-08-06 NOTE — Progress Notes (Signed)
Robert Rhodes, is a 70 y.o. male  MGN:[id number]  BVQ:[id number]  DOB - 1946/02/15  CC:  Chief Complaint  Patient presents with  . Follow-up    HTN       HPI: Robert Rhodes is a 70 y.o. male here today for follow up visit. Patient has a history of ulcerative colitis (on sulfaSALAzine 500 mg QID), vitamin D deficiency, GERD , colon cancer status post abdominal coloectomy 08/13/2013.Patient is being followed by Dr Hilarie Fredrickson of [facility] for ulcerative colitis and has appointment in March. Patient has continued complaint of gastric reflux which has improved only slightly on Omeprazole. Patient admits not taking omeprazole everyday but states compliance with all other medications. Patient saw Dr Lindell Noe 06/18/2015 for diarrhea and eye pain, the eye pain has resolved and patient states diarrhea is much better at this time. Patient has no other complaints today. Medications being refilled at the [facility]. Patient has No headache, No chest pain, No abdominal pain - No Nausea, No new weakness tingling or numbness, No Cough - SOB. Patient has flight of ideas and "delusions". Refuses to see psychiatrist.   Allergies  Allergen Reactions  . Ace Inhibitors Other (See Comments)    Cant remember  . Advil [Ibuprofen] Other (See Comments)    Muscle tightness  . Aspirin Nausea And Vomiting  . Tylenol [Acetaminophen] Other (See Comments)    Muscle tightness   Past Medical History  Diagnosis Date  . Vitamin D deficiency   . UC (ulcerative colitis) ([place])   . Arthritis   . Osteoporosis   . GERD (gastroesophageal reflux disease)   . Colon cancer ([place])   . Eye abnormalities     right eye seen in ED 02/17/2014 wearing eye patch using eye drops  . Claustrophobia   . Adenomatous colon polyp    Current Outpatient Prescriptions on File Prior to Visit  Medication Sig Dispense Refill  . feeding supplement, RESOURCE BREEZE, (RESOURCE BREEZE) LIQD Take 1 Container by mouth 3  (three) times daily between meals.  0  . Multiple Vitamin (MULTIVITAMIN WITH MINERALS) TABS tablet Take 1 tablet by mouth daily at 12 noon.    . vitamin A 10000 UNIT capsule Take 10,000 Units by mouth daily.    . vitamin E 100 UNIT capsule Take 100 Units by mouth daily.     No current facility-administered medications on file prior to visit.   Family History  Problem Relation Age of Onset  . Cancer Mother     type unknown  . Diabetes Father   . Heart disease Father   . Heart disease Sister   . Heart attack Brother   . Colon cancer Neg Hx   . Esophageal cancer Neg Hx   . Rectal cancer Neg Hx   . Stomach cancer Neg Hx    Social History   Social History  . Marital Status: Single    Spouse Name: N/A  . Number of Children: 0  . Years of Education: N/A   Occupational History  . retired    Social History Main Topics  . Smoking status: Former Smoker -- 0.00 packs/day    Types: Cigarettes    Quit date: 06/14/2003  . Smokeless tobacco: Never Used     Comment: quit in July 2005  . Alcohol Use: No  . Drug Use: Yes    Special: Marijuana     Comment: last drug 11/26/14  . Sexual Activity: Yes   Other Topics Concern  .  Not on file   Social History Narrative    Review of Systems: Constitutional: Negative for fever, chills, diaphoresis, activity change, appetite change and fatigue. HENT: Negative for ear pain, nosebleeds, congestion, facial swelling, rhinorrhea, neck pain, neck stiffness and ear discharge.  Eyes: Negative for pain, discharge, redness, itching and visual disturbance. Respiratory: Negative for cough, choking, chest tightness, shortness of breath, wheezing and stridor.  Cardiovascular: Negative for chest pain, palpitations and leg swelling. Gastrointestinal: Negative for abdominal distention. Genitourinary: Negative for dysuria, urgency, frequency, hematuria, flank pain, decreased urine volume, difficulty urinating and dyspareunia.  Musculoskeletal: Negative for  back pain, joint swelling, arthralgia and gait problem. Neurological: Negative for dizziness, tremors, seizures, syncope, facial asymmetry, speech difficulty, weakness, light-headedness, numbness and headaches.  Hematological: Negative for adenopathy. Does not bruise/bleed easily. Psychiatric/Behavioral: Negative for hallucinations, behavioral problems, confusion, dysphoric mood, decreased concentration and agitation.    Objective:   Filed Vitals:   08/06/15 1450  BP: 148/71  Pulse: 72  Temp: 98.3 F (36.8 C)  Resp: 18    Physical Exam: Constitutional: Patient appears well-developed and well-nourished. No distress. Dreadlocks HENT: Normocephalic, atraumatic, External right and left ear normal. Oropharynx is clear and moist.  CVS: RRR, S1/S2 +, no murmurs, no gallops, no carotid bruit.  Pulmonary: Effort and breath sounds normal, no stridor, rhonchi, wheezes, rales.  Abdominal: Soft. BS +, no distension, tenderness, rebound or guarding.  Musculoskeletal: Normal range of motion. No edema and no tenderness.  Lymphadenopathy: No lymphadenopathy noted. Neuro: Alert & Oriented x 4 Skin: Skin is warm and dry. No rash noted. Not diaphoretic. No erythema. No pallor. Psychiatric: Normal mood and affect.  Lab Results  Component Value Date   WBC 9.3 08/24/2014   HGB 11.6* 08/24/2014   HCT 36.1* 08/24/2014   MCV 87.0 08/24/2014   PLT 366 08/24/2014   Lab Results  Component Value Date   CREATININE 0.82 11/27/2014   BUN 13 11/27/2014   NA 143 11/27/2014   K 3.5 11/27/2014   CL 108 11/27/2014   CO2 26 11/27/2014    No results found for: HGBA1C Lipid Panel     Component Value Date/Time   CHOL 186 03/05/2013 1021   TRIG 142 03/05/2013 1021   HDL 44 03/05/2013 1021   CHOLHDL 4.2 03/05/2013 1021   VLDL 28 03/05/2013 1021   LDLCALC 114* 03/05/2013 1021       Assessment and plan:   Dierre was seen today for follow-up.  Diagnoses and all orders for this visit:  Essential  hypertension, benign -     amLODipine (NORVASC) 2.5 MG tablet; Take 1 tablet (2.5 mg total) by mouth daily.  We have discussed target BP range and blood pressure goal. I have advised patient to check BP regularly and to call us back or report to clinic if the numbers are consistently higher than 140/90. We discussed the importance of compliance with medical therapy and DASH diet recommended, consequences of uncontrolled hypertension discussed.   - continue current BP medications  Ulcerative colitis, with rectal bleeding  -     cholecalciferol (VITAMIN D) 1000 units tablet; Take 1 tablet (1,000 Units total) by mouth daily. -     sulfaSALAzine (AZULFIDINE) 500 MG tablet; Take 2 tablets (1,000 mg total) by mouth 4 (four) times daily.  Gastroesophageal reflux disease without esophagitis  -     pantoprazole (PROTONIX) 40 MG tablet; Take 1 tablet (40 mg total) by mouth daily. Patient was counseled about the importance of taking this medication everyday.  Patient verbalized understanding.  Return in about 1 year (around 08/05/2016) for Ulcerative Colitis/GERD.  The patient was given clear instructions to go to ER or return to medical center if symptoms don't improve, worsen or new problems develop. The patient verbalized understanding.    Clois Dupes, [facility] [phone] 08/06/2015, 3:32 PM  Evaluation and management procedures were performed by the Advanced Practitioner under my supervision and collaboration. I have reviewed the Advanced Practitioner's note and chart, and I agree with the management and plan.   Angelica Chessman, MD, [place], [place], [place], [place] and [employer] [place], [place]   08/06/2015, 6:45 PM

## 2015-08-06 NOTE — Patient Instructions (Signed)
Colitis Colitis is inflammation of the colon. Colitis may last a short time (acute) or it may last a long time (chronic). CAUSES This condition may be caused by:  Viruses.  Bacteria.  Reactions to medicine.  Certain autoimmune diseases, such as Crohn disease or ulcerative colitis. SYMPTOMS Symptoms of this condition include:  Diarrhea.  Passing bloody or tarry stool.  Pain.  Fever.  Vomiting.  Tiredness (fatigue).  Weight loss.  Bloating.  Sudden increase in abdominal pain.  Having fewer bowel movements than usual. DIAGNOSIS This condition is diagnosed with a stool test or a blood test. You may also have other tests, including X-rays, a CT scan, or a colonoscopy. TREATMENT Treatment may include:  Resting the bowel. This involves not eating or drinking for a period of time.  Fluids that are given through an IV tube.  Medicine for pain and diarrhea.  Antibiotic medicines.  Cortisone medicines.  Surgery. HOME CARE INSTRUCTIONS Eating and Drinking  Follow instructions from your health care provider about eating or drinking restrictions.  Drink enough fluid to keep your urine clear or pale yellow.  Work with a dietitian to determine which foods cause your condition to flare up.  Avoid foods that cause flare-ups.  Eat a well-balanced diet. Medicines  Take over-the-counter and prescription medicines only as told by your health care provider.  If you were prescribed an antibiotic medicine, take it as told by your health care provider. Do not stop taking the antibiotic even if you start to feel better. General Instructions  Keep all follow-up visits as told by your health care provider. This is important. SEEK MEDICAL CARE IF:  Your symptoms do not go away.  You develop new symptoms. SEEK IMMEDIATE MEDICAL CARE IF:  You have a fever that does not go away with treatment.  You develop chills.  You have extreme weakness, fainting, or  dehydration.  You have repeated vomiting.  You develop severe pain in your abdomen.  You pass bloody or tarry stool.   This information is not intended to replace advice given to you by your health care provider. Make sure you discuss any questions you have with your health care provider.   Document Released: 07/07/2004 Document Revised: 02/18/2015 Document Reviewed: 09/22/2014 Elsevier Interactive Patient Education [employer].

## 2015-08-06 NOTE — Progress Notes (Signed)
Patient is here for FU HTN  Patient denies pain at this time  Patient declined the flu shot today

## 2015-08-07 DIAGNOSIS — F1220 Cannabis dependence, uncomplicated: Secondary | ICD-10-CM | POA: Diagnosis not present

## 2015-08-07 DIAGNOSIS — F1120 Opioid dependence, uncomplicated: Secondary | ICD-10-CM | POA: Diagnosis not present

## 2015-08-07 DIAGNOSIS — Z5181 Encounter for therapeutic drug level monitoring: Secondary | ICD-10-CM | POA: Diagnosis not present

## 2015-08-11 DIAGNOSIS — Z5181 Encounter for therapeutic drug level monitoring: Secondary | ICD-10-CM | POA: Diagnosis not present

## 2015-08-11 DIAGNOSIS — F1120 Opioid dependence, uncomplicated: Secondary | ICD-10-CM | POA: Diagnosis not present

## 2015-08-11 DIAGNOSIS — F1220 Cannabis dependence, uncomplicated: Secondary | ICD-10-CM | POA: Diagnosis not present

## 2015-08-14 DIAGNOSIS — F1220 Cannabis dependence, uncomplicated: Secondary | ICD-10-CM | POA: Diagnosis not present

## 2015-08-14 DIAGNOSIS — Z5181 Encounter for therapeutic drug level monitoring: Secondary | ICD-10-CM | POA: Diagnosis not present

## 2015-08-14 DIAGNOSIS — F1120 Opioid dependence, uncomplicated: Secondary | ICD-10-CM | POA: Diagnosis not present

## 2015-08-17 DIAGNOSIS — F1220 Cannabis dependence, uncomplicated: Secondary | ICD-10-CM | POA: Diagnosis not present

## 2015-08-17 DIAGNOSIS — F1120 Opioid dependence, uncomplicated: Secondary | ICD-10-CM | POA: Diagnosis not present

## 2015-08-17 DIAGNOSIS — Z5181 Encounter for therapeutic drug level monitoring: Secondary | ICD-10-CM | POA: Diagnosis not present

## 2015-08-19 DIAGNOSIS — F1220 Cannabis dependence, uncomplicated: Secondary | ICD-10-CM | POA: Diagnosis not present

## 2015-08-19 DIAGNOSIS — F1120 Opioid dependence, uncomplicated: Secondary | ICD-10-CM | POA: Diagnosis not present

## 2015-08-19 DIAGNOSIS — Z5181 Encounter for therapeutic drug level monitoring: Secondary | ICD-10-CM | POA: Diagnosis not present

## 2015-08-21 ENCOUNTER — Encounter: Payer: Self-pay | Admitting: Internal Medicine

## 2015-08-21 ENCOUNTER — Other Ambulatory Visit (INDEPENDENT_AMBULATORY_CARE_PROVIDER_SITE_OTHER): Payer: Commercial Managed Care - HMO

## 2015-08-21 ENCOUNTER — Telehealth: Payer: Self-pay

## 2015-08-21 ENCOUNTER — Ambulatory Visit (INDEPENDENT_AMBULATORY_CARE_PROVIDER_SITE_OTHER): Payer: Commercial Managed Care - HMO | Admitting: Internal Medicine

## 2015-08-21 VITALS — BP 122/60 | HR 72 | Ht 69.0 in | Wt 206.2 lb

## 2015-08-21 DIAGNOSIS — K51919 Ulcerative colitis, unspecified with unspecified complications: Secondary | ICD-10-CM | POA: Diagnosis not present

## 2015-08-21 DIAGNOSIS — K7689 Other specified diseases of liver: Secondary | ICD-10-CM | POA: Diagnosis not present

## 2015-08-21 DIAGNOSIS — K219 Gastro-esophageal reflux disease without esophagitis: Secondary | ICD-10-CM

## 2015-08-21 DIAGNOSIS — Z85038 Personal history of other malignant neoplasm of large intestine: Secondary | ICD-10-CM | POA: Diagnosis not present

## 2015-08-21 DIAGNOSIS — K769 Liver disease, unspecified: Secondary | ICD-10-CM

## 2015-08-21 LAB — COMPREHENSIVE METABOLIC PANEL
ALT: 20 U/L (ref 0–53)
AST: 18 U/L (ref 0–37)
Albumin: 3.7 g/dL (ref 3.5–5.2)
Alkaline Phosphatase: 72 U/L (ref 39–117)
BUN: 9 mg/dL (ref 6–23)
CO2: 31 meq/L (ref 19–32)
Calcium: 9.5 mg/dL (ref 8.4–10.5)
Chloride: 103 mEq/L (ref 96–112)
Creatinine, Ser: 0.94 mg/dL (ref 0.40–1.50)
GFR: 101.51 mL/min (ref >60.00)
GLUCOSE: 60 mg/dL — AB (ref 70–99)
POTASSIUM: 3.7 meq/L (ref 3.5–5.1)
SODIUM: 140 meq/L (ref 135–145)
TOTAL PROTEIN: 7.7 g/dL (ref 6.0–8.3)
Total Bilirubin: 0.4 mg/dL (ref 0.2–1.2)

## 2015-08-21 LAB — CBC WITH DIFFERENTIAL/PLATELET
Basophils Absolute: 0.1 10*3/uL (ref 0.0–0.1)
Basophils Relative: 0.8 % (ref 0.0–3.0)
EOS PCT: 3.8 % (ref 0.0–5.0)
Eosinophils Absolute: 0.3 10*3/uL (ref 0.0–0.7)
HEMATOCRIT: 38.7 % — AB (ref 39.0–52.0)
Hemoglobin: 12.9 g/dL — ABNORMAL LOW (ref 13.0–17.0)
LYMPHS ABS: 3.2 10*3/uL (ref 0.7–4.0)
LYMPHS PCT: 34.5 % (ref 12.0–46.0)
MCHC: 33.3 g/dL (ref 30.0–36.0)
MCV: 84.2 fl (ref 78.0–100.0)
MONOS PCT: 12.2 % — AB (ref 3.0–12.0)
Monocytes Absolute: 1.1 10*3/uL — ABNORMAL HIGH (ref 0.1–1.0)
NEUTROS ABS: 4.5 10*3/uL (ref 1.4–7.7)
NEUTROS PCT: 48.7 % (ref 43.0–77.0)
PLATELETS: 420 10*3/uL — AB (ref 150.0–400.0)
RBC: 4.6 Mil/uL (ref 4.22–5.81)
RDW: 14.1 % (ref 11.5–15.5)
WBC: 9.2 10*3/uL (ref 4.0–10.5)

## 2015-08-21 LAB — C-REACTIVE PROTEIN: CRP: 1.9 mg/dL (ref 0.5–20.0)

## 2015-08-21 LAB — VITAMIN B12: Vitamin B-12: 419 pg/mL (ref 211–911)

## 2015-08-21 MED ORDER — MESALAMINE 4 G RE ENEM: 4.0000 g | ENEMA | Freq: Every day | RECTAL | Status: DC

## 2015-08-21 MED ORDER — RANITIDINE HCL 150 MG PO TABS: 150.0000 mg | ORAL_TABLET | Freq: Two times a day (BID) | ORAL | Status: DC

## 2015-08-21 MED FILL — raNITIdine HCL 150 MG TABS: 150 | 30 days supply | Qty: 60 | Fill #0

## 2015-08-21 NOTE — Patient Instructions (Signed)
Per Dr. Hilarie Fredrickson, discontinue Sulfasalazine and Protonix  You may begin taking Zantac, twice a day  We have sent the following medications to your pharmacy for you to pick up at your convenience:  Rowasa.   This is to be held in overnight.  If you are unable to do this, or your diarrhea does not improve, please call the office.   Your physician has requested that you go to the basement for the following lab work before leaving today:  CRP, CMET, CMP, B12

## 2015-08-21 NOTE — Progress Notes (Signed)
Subjective:    Patient ID: Robert Rhodes, male    DOB: [birth year], 70 y.o.   MRN: [id number]  HPI Robert Rhodes is a 69 year old male with past medical history of ulcerative colitis, colon cancer status post subtotal colectomy with ileorectal anastomosis in March 2015, GERD with history of esophagitis, hiatal hernia who is here for follow-up. He was last seen in the office in January 2016. Several days after this appointment he returned for flexible sigmoidoscopy with biopsy. 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 The proximal rectum and ileorectal anastomosis appeared normal and healthy. At that time he was continued on sulfasalazine and Canasa was recommended.  He reports that on the whole he has been doing fairly well. He is having days of what he feels his diarrhea. This can be 5-8 bowel movements per day. Normal for him after surgery he feels is more 4-5 times a day with more soft stool. He feels his sulfasalazine and pantoprazole worsens his diarrhea. He's been using sulfasalazine intermittently but pantoprazole 40 mg daily. He is not using Canasa because this prescription expired. He seeing very little blood in his stool. Denies tenesmus. Denies abdominal pain. Currently heartburn is well controlled and he denies dysphagia or odynophagia. He reports regular/normal appetite.  Review of Systems As per HPI, otherwise negative  Current Medications, Allergies, Past Medical History, Past Surgical History, Family History and Social History were reviewed in [employer] record.     Objective:   Physical Exam BP 122/60 mmHg  Pulse 72  Ht 5' 9"  (1.753 m)  Wt 206 lb 4 oz (93.554 kg)  BMI 30.44 kg/m2 Constitutional: Well-developed and well-nourished. No distress. HEENT: Normocephalic and atraumatic. Oropharynx is clear and moist. No oropharyngeal exudate. Conjunctivae are normal.  No  scleral icterus. Neck: Neck supple. Trachea midline. Cardiovascular: Normal rate, regular rhythm and intact distal pulses. No M/R/G Pulmonary/chest: Effort normal and breath sounds normal. No wheezing, rales or rhonchi. Abdominal: Soft, nontender, nondistended. Bowel sounds active throughout. There are no masses palpable. No hepatosplenomegaly. Extremities: no clubbing, cyanosis, or edema Lymphadenopathy: No cervical adenopathy noted. Neurological: Alert and oriented to person place and time. Skin: Skin is warm and dry. No rashes noted. Psychiatric: Normal mood and affect. Behavior is normal.      Assessment & Plan:  70 year old male with past medical history of ulcerative colitis, colon cancer status post subtotal colectomy with ileorectal anastomosis in March 2015, GERD with history of esophagitis, hiatal hernia who is here for follow-up.   1. 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 Given a topical nature of sulfasalazine therapy it is also likely not be providing much benefit at this point. I'm going to stop sulfasalazine altogether. I'm going to place him on Rowasa 4 g daily at bedtime. He is instructed to try to retain this medication overnight. Hopefully with discontinuation of sulfasalazine and pantoprazole his loose stools will improve. He will have multiple bowel movements a day given his subtotal colectomy. If he is unable to retain Rowasa overnight he is asked to let us know and he voices understanding. Check CBC, CMP and CRP today. Given surgery check B12. He will need surveillance flexible sigmoidoscopy every 2 years  2. GERD with history of erosive esophagitis -- given his significant history of GERD and esophagitis I think he will need acid suppression therapy going forward. Pantoprazole as a  PPI may be worsening loose stools and diarrhea. Discontinue pantoprazole. Begin ranitidine 150 twice a day. Call if GERD  worsens off PPI or should he develop any swallowing trouble.  3. History of colon cancer -- status post resection. Surveillance flexible sigmoidoscopy to evaluate rectal stump recommended every 24 months.  4. 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 Repeat MR was previous he recommended but not performed. We'll investigate further, if unable to tolerate MRI then would recommend CT for further evaluation  25 minutes spent with the patient today. Greater than 50% was spent in counseling and coordination of care with the patient 6 month ROV

## 2015-08-21 NOTE — Telephone Encounter (Signed)
No answer, no voicemail.  If patient calls, I had to send his Rowasa rx to [facility] does not carry it.

## 2015-08-24 DIAGNOSIS — F1220 Cannabis dependence, uncomplicated: Secondary | ICD-10-CM | POA: Diagnosis not present

## 2015-08-24 DIAGNOSIS — Z5181 Encounter for therapeutic drug level monitoring: Secondary | ICD-10-CM | POA: Diagnosis not present

## 2015-08-24 DIAGNOSIS — F1120 Opioid dependence, uncomplicated: Secondary | ICD-10-CM | POA: Diagnosis not present

## 2015-08-26 DIAGNOSIS — F1120 Opioid dependence, uncomplicated: Secondary | ICD-10-CM | POA: Diagnosis not present

## 2015-08-26 DIAGNOSIS — Z5181 Encounter for therapeutic drug level monitoring: Secondary | ICD-10-CM | POA: Diagnosis not present

## 2015-08-26 DIAGNOSIS — F1220 Cannabis dependence, uncomplicated: Secondary | ICD-10-CM | POA: Diagnosis not present

## 2015-08-27 DIAGNOSIS — Z5181 Encounter for therapeutic drug level monitoring: Secondary | ICD-10-CM | POA: Diagnosis not present

## 2015-08-27 DIAGNOSIS — F1220 Cannabis dependence, uncomplicated: Secondary | ICD-10-CM | POA: Diagnosis not present

## 2015-08-27 DIAGNOSIS — F1120 Opioid dependence, uncomplicated: Secondary | ICD-10-CM | POA: Diagnosis not present

## 2015-08-28 DIAGNOSIS — Z5181 Encounter for therapeutic drug level monitoring: Secondary | ICD-10-CM | POA: Diagnosis not present

## 2015-08-28 DIAGNOSIS — F1120 Opioid dependence, uncomplicated: Secondary | ICD-10-CM | POA: Diagnosis not present

## 2015-08-28 DIAGNOSIS — F1220 Cannabis dependence, uncomplicated: Secondary | ICD-10-CM | POA: Diagnosis not present

## 2015-08-31 DIAGNOSIS — Z5181 Encounter for therapeutic drug level monitoring: Secondary | ICD-10-CM | POA: Diagnosis not present

## 2015-08-31 DIAGNOSIS — F1120 Opioid dependence, uncomplicated: Secondary | ICD-10-CM | POA: Diagnosis not present

## 2015-08-31 DIAGNOSIS — F1220 Cannabis dependence, uncomplicated: Secondary | ICD-10-CM | POA: Diagnosis not present

## 2015-09-01 DIAGNOSIS — Z5181 Encounter for therapeutic drug level monitoring: Secondary | ICD-10-CM | POA: Diagnosis not present

## 2015-09-01 DIAGNOSIS — F1120 Opioid dependence, uncomplicated: Secondary | ICD-10-CM | POA: Diagnosis not present

## 2015-09-01 DIAGNOSIS — F1220 Cannabis dependence, uncomplicated: Secondary | ICD-10-CM | POA: Diagnosis not present

## 2015-09-02 DIAGNOSIS — F1120 Opioid dependence, uncomplicated: Secondary | ICD-10-CM | POA: Diagnosis not present

## 2015-09-05 DIAGNOSIS — F1220 Cannabis dependence, uncomplicated: Secondary | ICD-10-CM | POA: Diagnosis not present

## 2015-09-05 DIAGNOSIS — Z5181 Encounter for therapeutic drug level monitoring: Secondary | ICD-10-CM | POA: Diagnosis not present

## 2015-09-05 DIAGNOSIS — F1120 Opioid dependence, uncomplicated: Secondary | ICD-10-CM | POA: Diagnosis not present

## 2015-09-07 DIAGNOSIS — Z5181 Encounter for therapeutic drug level monitoring: Secondary | ICD-10-CM | POA: Diagnosis not present

## 2015-09-07 DIAGNOSIS — F1120 Opioid dependence, uncomplicated: Secondary | ICD-10-CM | POA: Diagnosis not present

## 2015-09-07 DIAGNOSIS — F1220 Cannabis dependence, uncomplicated: Secondary | ICD-10-CM | POA: Diagnosis not present

## 2015-09-08 ENCOUNTER — Telehealth: Payer: Self-pay | Admitting: *Deleted

## 2015-09-08 DIAGNOSIS — Z5181 Encounter for therapeutic drug level monitoring: Secondary | ICD-10-CM | POA: Diagnosis not present

## 2015-09-08 DIAGNOSIS — F1120 Opioid dependence, uncomplicated: Secondary | ICD-10-CM | POA: Diagnosis not present

## 2015-09-08 DIAGNOSIS — F1220 Cannabis dependence, uncomplicated: Secondary | ICD-10-CM | POA: Diagnosis not present

## 2015-09-08 NOTE — Telephone Encounter (Signed)
-----   Message from Larina Bras, [place] sent at 09/08/2015  8:19 AM EDT ----- ===View-only below this line===  ----- Message -----    From: Jerene Bears, MD    Sent: 09/07/2015   5:09 PM      To: Larina Bras, CMA  Try CT abd with contrast I seem to remember anxiety about the MR One more shot JMP  ----- Message -----    From: Larina Bras, CMA    Sent: 09/07/2015   4:58 PM      To: Jerene Bears, MD  Hey, see 10/22/14 notes. Is he actually planning on going through with MRI or CT this time? I repeatedly tried to have him do this previously to no avail.  ----- Message -----    From: Jerene Bears, MD    Sent: 09/07/2015   4:53 PM      To: Larina Bras, CMA  Pt needs MRI liver or CT liver if he cannot tolerate the former Hx of liver lesions that need surveillance JMP  ----- Message -----    From: SYSTEM    Sent: 09/05/2015  12:05 AM      To: Jerene Bears, MD

## 2015-09-08 NOTE — Telephone Encounter (Signed)
I have left a voicemail for patient to call back.

## 2015-09-09 DIAGNOSIS — F1120 Opioid dependence, uncomplicated: Secondary | ICD-10-CM | POA: Diagnosis not present

## 2015-09-09 DIAGNOSIS — Z5181 Encounter for therapeutic drug level monitoring: Secondary | ICD-10-CM | POA: Diagnosis not present

## 2015-09-09 DIAGNOSIS — F1220 Cannabis dependence, uncomplicated: Secondary | ICD-10-CM | POA: Diagnosis not present

## 2015-09-11 DIAGNOSIS — F1120 Opioid dependence, uncomplicated: Secondary | ICD-10-CM | POA: Diagnosis not present

## 2015-09-11 DIAGNOSIS — F1220 Cannabis dependence, uncomplicated: Secondary | ICD-10-CM | POA: Diagnosis not present

## 2015-09-11 DIAGNOSIS — Z5181 Encounter for therapeutic drug level monitoring: Secondary | ICD-10-CM | POA: Diagnosis not present

## 2015-09-11 NOTE — Telephone Encounter (Signed)
Left another voicemail for patient to call back.

## 2015-09-14 DIAGNOSIS — Z5181 Encounter for therapeutic drug level monitoring: Secondary | ICD-10-CM | POA: Diagnosis not present

## 2015-09-14 DIAGNOSIS — F1220 Cannabis dependence, uncomplicated: Secondary | ICD-10-CM | POA: Diagnosis not present

## 2015-09-14 DIAGNOSIS — F1120 Opioid dependence, uncomplicated: Secondary | ICD-10-CM | POA: Diagnosis not present

## 2015-09-15 NOTE — Telephone Encounter (Signed)
I have left one last voicemail for patient to call back. I will await patient's return call.

## 2015-10-08 DIAGNOSIS — F1410 Cocaine abuse, uncomplicated: Secondary | ICD-10-CM | POA: Diagnosis not present

## 2015-10-08 DIAGNOSIS — F1210 Cannabis abuse, uncomplicated: Secondary | ICD-10-CM | POA: Diagnosis not present

## 2015-10-10 ENCOUNTER — Emergency Department (HOSPITAL_COMMUNITY)
Admission: EM | Admit: 2015-10-10 | Discharge: 2015-10-10 | Disposition: A | Discharge status: Home / Self Care | Payer: Commercial Managed Care - HMO | Attending: Emergency Medicine | Admitting: Emergency Medicine

## 2015-10-10 ENCOUNTER — Emergency Department (HOSPITAL_COMMUNITY): Payer: Commercial Managed Care - HMO

## 2015-10-10 ENCOUNTER — Encounter (HOSPITAL_COMMUNITY): Payer: Self-pay | Admitting: Nurse Practitioner

## 2015-10-10 DIAGNOSIS — E559 Vitamin D deficiency, unspecified: Secondary | ICD-10-CM | POA: Diagnosis not present

## 2015-10-10 DIAGNOSIS — M545 Low back pain, unspecified: Secondary | ICD-10-CM

## 2015-10-10 DIAGNOSIS — M25462 Effusion, left knee: Secondary | ICD-10-CM | POA: Diagnosis not present

## 2015-10-10 DIAGNOSIS — Z8659 Personal history of other mental and behavioral disorders: Secondary | ICD-10-CM | POA: Diagnosis not present

## 2015-10-10 DIAGNOSIS — K219 Gastro-esophageal reflux disease without esophagitis: Secondary | ICD-10-CM | POA: Diagnosis not present

## 2015-10-10 DIAGNOSIS — Z8669 Personal history of other diseases of the nervous system and sense organs: Secondary | ICD-10-CM | POA: Diagnosis not present

## 2015-10-10 DIAGNOSIS — Z87891 Personal history of nicotine dependence: Secondary | ICD-10-CM | POA: Diagnosis not present

## 2015-10-10 DIAGNOSIS — M25562 Pain in left knee: Secondary | ICD-10-CM | POA: Diagnosis not present

## 2015-10-10 DIAGNOSIS — Z79899 Other long term (current) drug therapy: Secondary | ICD-10-CM | POA: Diagnosis not present

## 2015-10-10 DIAGNOSIS — Z8739 Personal history of other diseases of the musculoskeletal system and connective tissue: Secondary | ICD-10-CM | POA: Insufficient documentation

## 2015-10-10 DIAGNOSIS — Z85038 Personal history of other malignant neoplasm of large intestine: Secondary | ICD-10-CM | POA: Insufficient documentation

## 2015-10-10 DIAGNOSIS — Z86010 Personal history of colonic polyps: Secondary | ICD-10-CM | POA: Diagnosis not present

## 2015-10-10 MED ORDER — OXYCODONE HCL 5 MG PO TABS: 5.0000 mg | ORAL_TABLET | Freq: Four times a day (QID) | ORAL | Status: DC | PRN

## 2015-10-10 NOTE — ED Provider Notes (Signed)
CSN: [id number]     Arrival date & time 10/10/15  1829 History  By signing my name below, I, Robert Rhodes, attest that this documentation has been prepared under the direction and in the presence of Montine Circle, PA-C. Electronically Signed: Judithann Sauger, ED Scribe. 10/10/2015. 7:01 PM.     Chief Complaint  Patient presents with  . Knee Pain   The history is provided by the patient. No language interpreter was used.   HPI Comments: Robert Rhodes is a 70 y.o. male who presents to the Emergency Department complaining of gradually worsening left knee pain onset 4 days ago. He reports associated intermittent lower back pain. Pt denies any recent trauma but states that he his knee nearly buckled while walking prior to onset of knee pain. Pt ambulates with cane at baseline. No alleviating factors noted. Pt has tried aleve with no relief. He denies any fever, chills, n/v, open wounds, or bowel/bladder incontinence.  He also complains some low-back pain, which is not new.  He denies numbness, weakness, or tingling.  Denies bowel or bladder incontinence.  Past Medical History  Diagnosis Date  . Vitamin D deficiency   . UC (ulcerative colitis) ([place])   . Arthritis   . Osteoporosis   . GERD (gastroesophageal reflux disease)   . Colon cancer ([place])   . Eye abnormalities     right eye seen in ED 02/17/2014 wearing eye patch using eye drops  . Claustrophobia   . Adenomatous colon polyp    Past Surgical History  Procedure Laterality Date  . Ventral hernia repair    . Eus N/A 07/18/2013    Procedure: UPPER ENDOSCOPIC ULTRASOUND (EUS) RADIAL;  Surgeon: Milus Banister, MD;  Location: [facility];  Service: Endoscopy;  Laterality: N/A;  . Intraocular lens insertion Bilateral     6 yrs ago  . Colon resection N/A 08/23/2013    Procedure: Laparoscopic total abdominal colectomy and hernia repair;  Surgeon: Leighton Ruff, MD;  Location: [facility];  Service: General;  Laterality: N/A;  . Colon  surgery    . Eye surgery    . Hernia repair    . Incisional hernia repair N/A 02/20/2014    Procedure: LAP ASSISTED INCISIONAL HERNIA REPAIR LYSIS OF ADHESIONS;  Surgeon: Leighton Ruff, MD;  Location: [facility];  Service: General;  Laterality: N/A;  converted to open @ 0935   . Incisional hernia repair N/A 02/20/2014    Procedure: HERNIA REPAIR INCISIONAL;  Surgeon: Leighton Ruff, MD;  Location: [facility];  Service: General;  Laterality: N/A;  With MESH  . Colonoscopy     Family History  Problem Relation Age of Onset  . Cancer Mother     type unknown  . Diabetes Father   . Heart disease Father   . Heart disease Sister   . Heart attack Brother   . Colon cancer Neg Hx   . Esophageal cancer Neg Hx   . Rectal cancer Neg Hx   . Stomach cancer Neg Hx    Social History  Substance Use Topics  . Smoking status: Former Smoker -- 0.00 packs/day    Types: Cigarettes    Quit date: 06/14/2003  . Smokeless tobacco: Never Used     Comment: quit in July 2005  . Alcohol Use: No    Review of Systems  Constitutional: Negative for fever and chills.  Gastrointestinal: Negative for nausea and vomiting.  Musculoskeletal: Positive for arthralgias (left knee).  Skin: Negative for wound.  Allergies  Ace inhibitors; Advil; Aspirin; and Tylenol  Home Medications   Prior to Admission medications   Medication Sig Start Date End Date Taking? Authorizing Provider  amLODipine (NORVASC) 2.5 MG tablet Take 1 tablet (2.5 mg total) by mouth daily. 08/06/15   Tresa Garter, MD  cholecalciferol (VITAMIN D) 1000 units tablet Take 1 tablet (1,000 Units total) by mouth daily. 08/06/15   Tresa Garter, MD  feeding supplement, RESOURCE BREEZE, (RESOURCE BREEZE) LIQD Take 1 Container by mouth 3 (three) times daily between meals. Patient taking differently: Take 1 Container by mouth daily.  08/26/14   Shanker Kristeen Mans, MD  mesalamine (ROWASA) 4 g enema Place 60 mLs (4 g total) rectally at bedtime.  08/21/15   Jerene Bears, MD  mesalamine (ROWASA) 4 g enema Place 60 mLs (4 g total) rectally at bedtime. 08/21/15   Jerene Bears, MD  Multiple Vitamin (MULTIVITAMIN WITH MINERALS) TABS tablet Take 1 tablet by mouth daily at 12 noon. 08/26/14   Shanker Kristeen Mans, MD  pantoprazole (PROTONIX) 40 MG tablet Take 1 tablet (40 mg total) by mouth daily. 08/06/15   Tresa Garter, MD  ranitidine (ZANTAC) 150 MG tablet Take 1 tablet (150 mg total) by mouth 2 (two) times daily. 08/21/15   Jerene Bears, MD  vitamin A 10000 UNIT capsule Take 10,000 Units by mouth daily.    Historical Provider, MD  vitamin E 100 UNIT capsule Take 100 Units by mouth daily.    Historical Provider, MD   BP 138/93 mmHg  Pulse 110  Temp(Src) 98.9 F (37.2 C) (Oral)  Resp 16  SpO2 99% Physical Exam Physical Exam  Constitutional: Pt appears well-developed and well-nourished. No distress.  HENT:  Head: Normocephalic and atraumatic.  Eyes: Conjunctivae are normal.  Neck: Normal range of motion.  Cardiovascular: Normal rate, regular rhythm and intact distal pulses.   Capillary refill < 3 sec  Pulmonary/Chest: Effort normal and breath sounds normal.  Musculoskeletal: Pt exhibits tenderness along the anterior joint lines. Pt exhibits no edema.  ROM: 4/5 limited by pain Neurological: Pt  is alert. Coordination normal.  Sensation 5/5 Strength 5/5  Skin: Skin is warm and dry. Pt is not diaphoretic.  No tenting of the skin  Psychiatric: Pt has a normal mood and affect.  Nursing note and vitals reviewed.  ED Course  Procedures (including critical care time) DIAGNOSTIC STUDIES: Oxygen Saturation is 99% on RA, normal by my interpretation.    COORDINATION OF CARE: 6:52 PM- Pt advised of plan for treatment and pt agrees. Pt will receive x-ray for further evaluation.   Imaging Review Dg Knee Complete 4 Views Left  10/10/2015  CLINICAL DATA:  Acute onset of gradually worsening left knee pain. Initial encounter. EXAM: LEFT  KNEE - COMPLETE 4+ VIEW COMPARISON:  Left knee radiographs performed 03/05/2013 FINDINGS: There is no evidence of fracture or dislocation. There is mild narrowing of the medial and lateral compartments, with mild cortical irregularity along the medial femoral condyle. Minimal degenerative change is noted at the patellofemoral compartment. A fabella is noted. A large knee joint effusion is seen. IMPRESSION: 1. No evidence of fracture or dislocation. 2. Mild narrowing of the medial and lateral compartments, with mild cortical irregularity along the medial femoral condyle. 3. Large knee joint effusion noted. MRI could be considered for further evaluation, on an elective nonemergent basis. Electronically Signed   By: Garald Balding M.D.   On: 10/10/2015 19:45     Montine Circle,  PA-C has personally reviewed and evaluated these images as part of his medical decision-making.   MDM   Final diagnoses:  Left knee pain  Bilateral low back pain without sciatica    Patient With left knee pain. Effusion on x-ray, but no evidence of septic joint or DVT on exam. Will recommend orthopedic follow-up. Will give knee immobilizer. Patient prefers to use his cane instead of crutches. Regarding his back pain, advised him to do stretching and exercise. Recommend primary care/orthopedic follow-up for this.  I personally performed the services described in this documentation, which was scribed in my presence. The recorded information has been reviewed and is accurate.     Montine Circle, PA-C 10/10/15 [place], MD 10/15/15 [phone]

## 2015-10-10 NOTE — ED Notes (Signed)
Pt c/o 4 day history L knee pain. He denies any injury. He has tried aleve with no relief. The pain is waking him from his sleep.

## 2015-10-10 NOTE — ED Notes (Signed)
Pt verbalized understanding of d/c instructions and has no further questions. Pt stable and NAD.  

## 2015-10-10 NOTE — Discharge Instructions (Signed)
Back Exercises The following exercises strengthen the muscles that help to support the back. They also help to keep the lower back flexible. Doing these exercises can help to prevent back pain or lessen existing pain. If you have back pain or discomfort, try doing these exercises 2-3 times each day or as told by your health care provider. When the pain goes away, do them once each day, but increase the number of times that you repeat the steps for each exercise (do more repetitions). If you do not have back pain or discomfort, do these exercises once each day or as told by your health care provider. EXERCISES Single Knee to Chest Repeat these steps 3-5 times for each leg: 1. Lie on your back on a firm bed or the floor with your legs extended. 2. Bring one knee to your chest. Your other leg should stay extended and in contact with the floor. 3. Hold your knee in place by grabbing your knee or thigh. 4. Pull on your knee until you feel a gentle stretch in your lower back. 5. Hold the stretch for 10-30 seconds. 6. Slowly release and straighten your leg. Pelvic Tilt Repeat these steps 5-10 times: 1. Lie on your back on a firm bed or the floor with your legs extended. 2. Bend your knees so they are pointing toward the ceiling and your feet are flat on the floor. 3. Tighten your lower abdominal muscles to press your lower back against the floor. This motion will tilt your pelvis so your tailbone points up toward the ceiling instead of pointing to your feet or the floor. 4. With gentle tension and even breathing, hold this position for 5-10 seconds. Cat-Cow Repeat these steps until your lower back becomes more flexible: 1. Get into a hands-and-knees position on a firm surface. Keep your hands under your shoulders, and keep your knees under your hips. You may place padding under your knees for comfort. 2. Let your head hang down, and point your tailbone toward the floor so your lower back becomes  rounded like the back of a cat. 3. Hold this position for 5 seconds. 4. Slowly lift your head and point your tailbone up toward the ceiling so your back forms a sagging arch like the back of a cow. 5. Hold this position for 5 seconds. Press-Ups Repeat these steps 5-10 times: 1. Lie on your abdomen (face-down) on the floor. 2. Place your palms near your head, about shoulder-width apart. 3. While you keep your back as relaxed as possible and keep your hips on the floor, slowly straighten your arms to raise the top half of your body and lift your shoulders. Do not use your back muscles to raise your upper torso. You may adjust the placement of your hands to make yourself more comfortable. 4. Hold this position for 5 seconds while you keep your back relaxed. 5. Slowly return to lying flat on the floor. Bridges Repeat these steps 10 times: 1. Lie on your back on a firm surface. 2. Bend your knees so they are pointing toward the ceiling and your feet are flat on the floor. 3. Tighten your buttocks muscles and lift your buttocks off of the floor until your waist is at almost the same height as your knees. You should feel the muscles working in your buttocks and the back of your thighs. If you do not feel these muscles, slide your feet 1-2 inches farther away from your buttocks. 4. Hold this position for 3-5  seconds. 5. Slowly lower your hips to the starting position, and allow your buttocks muscles to relax completely. If this exercise is too easy, try doing it with your arms crossed over your chest. Abdominal Crunches Repeat these steps 5-10 times: 1. Lie on your back on a firm bed or the floor with your legs extended. 2. Bend your knees so they are pointing toward the ceiling and your feet are flat on the floor. 3. Cross your arms over your chest. 4. Tip your chin slightly toward your chest without bending your neck. 5. Tighten your abdominal muscles and slowly raise your trunk (torso) high  enough to lift your shoulder blades a tiny bit off of the floor. Avoid raising your torso higher than that, because it can put too much stress on your low back and it does not help to strengthen your abdominal muscles. 6. Slowly return to your starting position. Back Lifts Repeat these steps 5-10 times: 1. Lie on your abdomen (face-down) with your arms at your sides, and rest your forehead on the floor. 2. Tighten the muscles in your legs and your buttocks. 3. Slowly lift your chest off of the floor while you keep your hips pressed to the floor. Keep the back of your head in line with the curve in your back. Your eyes should be looking at the floor. 4. Hold this position for 3-5 seconds. 5. Slowly return to your starting position. SEEK MEDICAL CARE IF:  Your back pain or discomfort gets much worse when you do an exercise.  Your back pain or discomfort does not lessen within 2 hours after you exercise. If you have any of these problems, stop doing these exercises right away. Do not do them again unless your health care provider says that you can. SEEK IMMEDIATE MEDICAL CARE IF:  You develop sudden, severe back pain. If this happens, stop doing the exercises right away. Do not do them again unless your health care provider says that you can.   This information is not intended to replace advice given to you by your health care provider. Make sure you discuss any questions you have with your health care provider.   Document Released: 07/07/2004 Document Revised: 02/18/2015 Document Reviewed: 07/24/2014 Elsevier Interactive Patient Education 2016 Elsevier Inc. Lumbosacral Strain Lumbosacral strain is a strain of any of the parts that make up your lumbosacral vertebrae. Your lumbosacral vertebrae are the bones that make up the lower third of your backbone. Your lumbosacral vertebrae are held together by muscles and tough, fibrous tissue (ligaments).  CAUSES  A sudden blow to your back can cause  lumbosacral strain. Also, anything that causes an excessive stretch of the muscles in the low back can cause this strain. This is typically seen when people exert themselves strenuously, fall, lift heavy objects, bend, or crouch repeatedly. RISK FACTORS 7. Physically demanding work. 8. Participation in pushing or pulling sports or sports that require a sudden twist of the back (tennis, golf, baseball). 9. Weight lifting. 10. Excessive lower back curvature. 11. Forward-tilted pelvis. 12. Weak back or abdominal muscles or both. 13. Tight hamstrings. SIGNS AND SYMPTOMS  Lumbosacral strain may cause pain in the area of your injury or pain that moves (radiates) down your leg.  DIAGNOSIS Your health care provider can often diagnose lumbosacral strain through a physical exam. In some cases, you may need tests such as X-ray exams.  TREATMENT  Treatment for your lower back injury depends on many factors that your clinician will have  to evaluate. However, most treatment will include the use of anti-inflammatory medicines. HOME CARE INSTRUCTIONS  5. Avoid hard physical activities (tennis, racquetball, waterskiing) if you are not in proper physical condition for it. This may aggravate or create problems. 6. If you have a back problem, avoid sports requiring sudden body movements. Swimming and walking are generally safer activities. 7. Maintain good posture. 8. Maintain a healthy weight. 9. For acute conditions, you may put ice on the injured area. 1. Put ice in a plastic bag. 2. Place a towel between your skin and the bag. 3. Leave the ice on for 20 minutes, 2-3 times a day. 10. When the low back starts healing, stretching and strengthening exercises may be recommended. SEEK MEDICAL CARE IF: 6. Your back pain is getting worse. 7. You experience severe back pain not relieved with medicines. SEEK IMMEDIATE MEDICAL CARE IF:  6. You have numbness, tingling, weakness, or problems with the use of your  arms or legs. 7. There is a change in bowel or bladder control. 8. You have increasing pain in any area of the body, including your belly (abdomen). 9. You notice shortness of breath, dizziness, or feel faint. 10. You feel sick to your stomach (nauseous), are throwing up (vomiting), or become sweaty. 11. You notice discoloration of your toes or legs, or your feet get very cold. MAKE SURE YOU:  6. Understand these instructions. 7. Will watch your condition. 8. Will get help right away if you are not doing well or get worse.   This information is not intended to replace advice given to you by your health care provider. Make sure you discuss any questions you have with your health care provider.   Document Released: 03/09/2005 Document Revised: 06/20/2014 Document Reviewed: 01/16/2013 Elsevier Interactive Patient Education 2016 Elsevier Inc. Knee Pain Knee pain is a very common symptom and can have many causes. Knee pain often goes away when you follow your health care provider's instructions for relieving pain and discomfort at home. However, knee pain can develop into a condition that needs treatment. Some conditions may include: 14. Arthritis caused by wear and tear (osteoarthritis). 15. Arthritis caused by swelling and irritation (rheumatoid arthritis or gout). 16. A cyst or growth in your knee. 17. An infection in your knee joint. 18. An injury that will not heal. 19. Damage, swelling, or irritation of the tissues that support your knee (torn ligaments or tendinitis). If your knee pain continues, additional tests may be ordered to diagnose your condition. Tests may include X-rays or other imaging studies of your knee. You may also need to have fluid removed from your knee. Treatment for ongoing knee pain depends on the cause, but treatment may include: 11. Medicines to relieve pain or swelling. 12. Steroid injections in your knee. 13. Physical therapy. 14. Surgery. HOME CARE  INSTRUCTIONS 8. Take medicines only as directed by your health care provider. 9. Rest your knee and keep it raised (elevated) while you are resting. 10. Do not do things that cause or worsen pain. 11. Avoid high-impact activities or exercises, such as running, jumping rope, or doing jumping jacks. 12. Apply ice to the knee area: 1. Put ice in a plastic bag. 2. Place a towel between your skin and the bag. 3. Leave the ice on for 20 minutes, 2-3 times a day. 42. Ask your health care provider if you should wear an elastic knee support. 14. Keep a pillow under your knee when you sleep. 15. Lose weight if  you are overweight. Extra weight can put pressure on your knee. 16. Do not use any tobacco products, including cigarettes, chewing tobacco, or electronic cigarettes. If you need help quitting, ask your health care provider. Smoking may slow the healing of any bone and joint problems that you may have. SEEK MEDICAL CARE IF: 12. Your knee pain continues, changes, or gets worse. 13. You have a fever along with knee pain. 14. Your knee buckles or locks up. 15. Your knee becomes more swollen. SEEK IMMEDIATE MEDICAL CARE IF:  9. Your knee joint feels hot to the touch. 10. You have chest pain or trouble breathing.   This information is not intended to replace advice given to you by your health care provider. Make sure you discuss any questions you have with your health care provider.   Document Released: 03/27/2007 Document Revised: 06/20/2014 Document Reviewed: 01/13/2014 Elsevier Interactive Patient Education [employer].

## 2015-10-13 ENCOUNTER — Telehealth: Payer: Self-pay | Admitting: Internal Medicine

## 2015-10-13 NOTE — Telephone Encounter (Signed)
Patient would like to know if he could get Oxycodone for the pain or an injection.. Our referral coordinator is working on his appt. With the orthopedic...please follow up

## 2015-10-16 DIAGNOSIS — M1711 Unilateral primary osteoarthritis, right knee: Secondary | ICD-10-CM | POA: Diagnosis not present

## 2015-10-16 MED FILL — MELOXICAM 7.5 MG TABLET: 7.5 | 30 days supply | Qty: 60 | Fill #0

## 2015-10-27 NOTE — Telephone Encounter (Signed)
Request has been sent to PCP for approval or denial.

## 2015-10-28 NOTE — Telephone Encounter (Signed)
Patient need to be seen for first time controlled substance prescription

## 2015-11-17 ENCOUNTER — Telehealth: Payer: Self-pay | Admitting: Internal Medicine

## 2015-11-17 DIAGNOSIS — H20021 Recurrent acute iridocyclitis, right eye: Secondary | ICD-10-CM | POA: Diagnosis not present

## 2015-11-17 NOTE — Telephone Encounter (Signed)
Pt has an eye appointment scheduled with Dr. Manuella Ghazi at [facility]  Pt needs referral

## 2015-11-18 NOTE — Telephone Encounter (Signed)
I called Dr Feliz Beam  Ph# [phone] I spoke to [place] . Pt has an appointment 12-29-15 @ [place] #[id number]   11/18/15 -05/16/16  Thank Dennis Bast

## 2015-11-20 ENCOUNTER — Other Ambulatory Visit: Payer: Self-pay | Admitting: Internal Medicine

## 2015-11-20 DIAGNOSIS — Z0100 Encounter for examination of eyes and vision without abnormal findings: Secondary | ICD-10-CM

## 2015-11-20 NOTE — Telephone Encounter (Signed)
Referral ordered.  Thanks

## 2015-11-20 NOTE — Telephone Encounter (Signed)
Noted . called Dr Feliz Beam Ph# [phone] I spoke to [place] . Pt has an appointment 12-29-15 @ [place] #[id number] 11/18/15 -05/16/16 Thank Dennis Bast

## 2015-11-23 MED FILL — MELOXICAM 7.5 MG TABLET: 7.5 | 30 days supply | Qty: 60 | Fill #1

## 2015-11-27 DIAGNOSIS — M1711 Unilateral primary osteoarthritis, right knee: Secondary | ICD-10-CM | POA: Diagnosis not present

## 2015-12-14 DIAGNOSIS — F1410 Cocaine abuse, uncomplicated: Secondary | ICD-10-CM | POA: Diagnosis not present

## 2015-12-14 DIAGNOSIS — F1210 Cannabis abuse, uncomplicated: Secondary | ICD-10-CM | POA: Diagnosis not present

## 2015-12-29 DIAGNOSIS — H43813 Vitreous degeneration, bilateral: Secondary | ICD-10-CM | POA: Diagnosis not present

## 2015-12-29 DIAGNOSIS — H30001 Unspecified focal chorioretinal inflammation, right eye: Secondary | ICD-10-CM | POA: Diagnosis not present

## 2015-12-29 DIAGNOSIS — Z961 Presence of intraocular lens: Secondary | ICD-10-CM | POA: Diagnosis not present

## 2015-12-30 DIAGNOSIS — Z139 Encounter for screening, unspecified: Secondary | ICD-10-CM | POA: Diagnosis not present

## 2015-12-31 MED FILL — raNITIdine HCL 150 MG TABS: 150 | 30 days supply | Qty: 60 | Fill #1

## 2016-01-18 DIAGNOSIS — Z139 Encounter for screening, unspecified: Secondary | ICD-10-CM | POA: Diagnosis not present

## 2016-01-19 DIAGNOSIS — M1711 Unilateral primary osteoarthritis, right knee: Secondary | ICD-10-CM | POA: Diagnosis not present

## 2016-01-19 DIAGNOSIS — M1712 Unilateral primary osteoarthritis, left knee: Secondary | ICD-10-CM | POA: Diagnosis not present

## 2016-02-08 MED FILL — raNITIdine HCL 150 MG TABS: 150 | 30 days supply | Qty: 60 | Fill #2

## 2016-02-09 DIAGNOSIS — M1711 Unilateral primary osteoarthritis, right knee: Secondary | ICD-10-CM | POA: Diagnosis not present

## 2016-02-09 DIAGNOSIS — M1712 Unilateral primary osteoarthritis, left knee: Secondary | ICD-10-CM | POA: Diagnosis not present

## 2016-02-18 DIAGNOSIS — Z0289 Encounter for other administrative examinations: Secondary | ICD-10-CM | POA: Diagnosis not present

## 2016-02-18 DIAGNOSIS — K219 Gastro-esophageal reflux disease without esophagitis: Secondary | ICD-10-CM | POA: Diagnosis not present

## 2016-02-18 DIAGNOSIS — I10 Essential (primary) hypertension: Secondary | ICD-10-CM | POA: Diagnosis not present

## 2016-02-18 DIAGNOSIS — F1290 Cannabis use, unspecified, uncomplicated: Secondary | ICD-10-CM | POA: Diagnosis not present

## 2016-02-26 DIAGNOSIS — Z139 Encounter for screening, unspecified: Secondary | ICD-10-CM | POA: Diagnosis not present

## 2016-04-01 ENCOUNTER — Encounter: Payer: Self-pay | Admitting: *Deleted

## 2016-04-13 ENCOUNTER — Encounter: Payer: Self-pay | Admitting: Internal Medicine

## 2016-04-28 ENCOUNTER — Encounter: Payer: Self-pay | Admitting: Internal Medicine

## 2016-05-23 DIAGNOSIS — F1410 Cocaine abuse, uncomplicated: Secondary | ICD-10-CM | POA: Diagnosis not present

## 2016-05-23 DIAGNOSIS — F1210 Cannabis abuse, uncomplicated: Secondary | ICD-10-CM | POA: Diagnosis not present

## 2016-06-01 ENCOUNTER — Ambulatory Visit (AMBULATORY_SURGERY_CENTER): Payer: Self-pay

## 2016-06-01 ENCOUNTER — Telehealth: Payer: Self-pay

## 2016-06-01 VITALS — Ht 69.0 in | Wt 207.0 lb

## 2016-06-01 DIAGNOSIS — C189 Malignant neoplasm of colon, unspecified: Secondary | ICD-10-CM

## 2016-06-01 DIAGNOSIS — M810 Age-related osteoporosis without current pathological fracture: Secondary | ICD-10-CM | POA: Insufficient documentation

## 2016-06-01 NOTE — Telephone Encounter (Signed)
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

## 2016-06-01 NOTE — Telephone Encounter (Signed)
Called pt and LMOM for pt to return call to get scheduled with DJ or one of MD's physician extenders.  Cancelled flex sig for now.                                                                                                                                                                                 Thank you,                                                                                                                                                                              Yulisa Chirico/PV

## 2016-06-01 NOTE — Telephone Encounter (Signed)
Moderate to severe Gerd symptoms.  Afraid to take PPI's related to side effects.  And does not want "to have to take a pill everyday". Does pt need EGD or OV?  Transportation: pt has his own [facility] if OV is needed.  Pt reported that he makes changes within his control I.e. 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                                                                                                                                                                   Thanks, Angela/PV

## 2016-06-01 NOTE — Progress Notes (Signed)
No allergies to eggs or soy No past problems with anesthesia No diet meds No home oxygen  Declined emmi

## 2016-06-15 ENCOUNTER — Encounter: Payer: Self-pay | Admitting: Internal Medicine

## 2016-06-15 ENCOUNTER — Ambulatory Visit (AMBULATORY_SURGERY_CENTER): Payer: Commercial Managed Care - HMO | Admitting: Internal Medicine

## 2016-06-15 VITALS — BP 146/85 | HR 87 | Temp 96.8°F | Resp 18 | Ht 69.0 in | Wt 207.0 lb

## 2016-06-15 DIAGNOSIS — Z8719 Personal history of other diseases of the digestive system: Secondary | ICD-10-CM | POA: Diagnosis present

## 2016-06-15 DIAGNOSIS — K219 Gastro-esophageal reflux disease without esophagitis: Secondary | ICD-10-CM

## 2016-06-15 DIAGNOSIS — K51919 Ulcerative colitis, unspecified with unspecified complications: Secondary | ICD-10-CM

## 2016-06-15 DIAGNOSIS — Z85038 Personal history of other malignant neoplasm of large intestine: Secondary | ICD-10-CM | POA: Diagnosis not present

## 2016-06-15 DIAGNOSIS — R69 Illness, unspecified: Secondary | ICD-10-CM | POA: Diagnosis not present

## 2016-06-15 DIAGNOSIS — K529 Noninfective gastroenteritis and colitis, unspecified: Secondary | ICD-10-CM | POA: Diagnosis not present

## 2016-06-15 MED ORDER — MESALAMINE 4 G RE ENEM: 4.0000 g | ENEMA | Freq: Every day | RECTAL | 11 refills | Status: DC

## 2016-06-15 MED ORDER — SODIUM CHLORIDE 0.9 % IV SOLN: 500.0000 mL | INTRAVENOUS | Status: DC

## 2016-06-15 NOTE — Op Note (Signed)
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 Medicines:                Monitored Anesthesia Care Procedure:                Pre-Anesthesia Assessment:                           - Prior to the procedure, a History and Physical                            was performed, and patient medications and                            allergies were reviewed. The patient's tolerance of                            previous anesthesia was also reviewed. The risks                            and benefits of the procedure and the sedation                            options and risks were discussed with the patient.                            All questions were answered, and informed consent                            was obtained. Prior Anticoagulants: The patient has                            taken no previous anticoagulant or antiplatelet  agents. ASA Grade Assessment: III - A patient with                            severe systemic disease. After reviewing the risks                            and benefits, the patient was deemed in                            satisfactory condition to undergo the procedure.                           After obtaining informed consent, the scope was                            passed under direct vision. The Model PCF-H190DL                             [id number]) scope was introduced through the anus                            and advanced to the the ileo-rectal anastomosis.                            The flexible sigmoidoscopy was accomplished without                            difficulty. The patient tolerated the procedure                            well. The quality of the bowel preparation was good. Scope In: 10:16:46 AM Scope Out: 10:23:10 AM Total Procedure Duration: 0 hours 6 minutes 24 seconds  Findings:                 There was evidence of a prior end-to-side                            ileo-rectal anastomosis in the proximal rectum                            (located between 15-20 cm from the dentate line).                            This was patent and was characterized by healthy                            appearing mucosa. The anastomosis was traversed.                           The ileum appeared normal.                           Diffuse moderate inflammation characterized by  congestion (edema), erythema, loss of vascularity                            and aphthous ulcerations was found in the rectum.                            Four biopsies were taken every 10 cm with a cold                            forceps from the rectum for ulcerative colitis                            surveillance. These biopsy specimens from the                            proximal rectum and distal rectum were sent to                            pathology.                           Retroflexion in the rectum was not performed due to                            anatomy but adequate views obtained from the distal                            rectum/dentate line in forward views. Complications:            No immediate complications. Estimated Blood Loss:     Estimated blood loss was minimal. Impression:               - Patent end-to-side ileo-rectal anastomosis,                             characterized by healthy appearing mucosa.                           - The terminal ileum is normal.                           - Diffuse moderate inflammation was found in the                            rectum secondary to proctitis ulcerative colitis.                            Biopsied. Recommendation:           - Patient has a contact number available for                            emergencies. The signs and symptoms of potential                            delayed complications were  discussed with the                            patient. Return to normal activities tomorrow.                            Written discharge instructions were provided to the                            patient.                           - Resume previous diet.                           - Await pathology results.                           - Repeat flexible sigmoidoscopy in 2 years for                            surveillance.                           - Continue present medications, but would resume                            Rowasa 4 g enemas at night given active proctitis                            seen today. Jerene Bears, MD 06/15/2016 10:34:02 AM This report has been signed electronically.

## 2016-06-15 NOTE — Progress Notes (Signed)
Report to PACU, RN, vss, BBS= Clear.  

## 2016-06-15 NOTE — Patient Instructions (Addendum)
YOU HAD AN ENDOSCOPIC PROCEDURE TODAY AT [facility]:   Refer to the procedure report that was given to you for any specific questions about what was found during the examination.  If the procedure report does not answer your questions, please call your gastroenterologist to clarify.  If you requested that your care partner not be given the details of your procedure findings, then the procedure report has been included in a sealed envelope for you to review at your convenience later.  YOU SHOULD EXPECT: Some feelings of bloating in the abdomen. Passage of more gas than usual.  Walking can help get rid of the air that was put into your GI tract during the procedure and reduce the bloating. If you had a lower endoscopy (such as a colonoscopy or flexible sigmoidoscopy) you may notice spotting of blood in your stool or on the toilet paper. If you underwent a bowel prep for your procedure, you may not have a normal bowel movement for a few days.  Please Note:  You might notice some irritation and congestion in your nose or some drainage.  This is from the oxygen used during your procedure.  There is no need for concern and it should clear up in a day or so.  SYMPTOMS TO REPORT IMMEDIATELY:   Following lower endoscopy (colonoscopy or flexible sigmoidoscopy):  Excessive amounts of blood in the stool  Significant tenderness or worsening of abdominal pains  Swelling of the abdomen that is new, acute  Fever of 100F or higher   Following upper endoscopy (EGD)  Vomiting of blood or coffee ground material  New chest pain or pain under the shoulder blades  Painful or persistently difficult swallowing  New shortness of breath  Fever of 100F or higher  Black, tarry-looking stools  For urgent or emergent issues, a gastroenterologist can be reached at any hour by calling [phone].   DIET:  We do recommend a small meal at first, but then you may proceed to your regular diet.  Drink  plenty of fluids but you should avoid alcoholic beverages for 24 hours.  ACTIVITY:  You should plan to take it easy for the rest of today and you should NOT DRIVE or use heavy machinery until tomorrow (because of the sedation medicines used during the test).    FOLLOW UP: Our staff will call the number listed on your records the next business day following your procedure to check on you and address any questions or concerns that you may have regarding the information given to you following your procedure. If we do not reach you, we will leave a message.  However, if you are feeling well and you are not experiencing any problems, there is no need to return our call.  We will assume that you have returned to your regular daily activities without incident.  If any biopsies were taken you will be contacted by phone or by letter within the next 1-3 weeks.  Please call us at [phone] if you have not heard about the biopsies in 3 weeks.    SIGNATURES/CONFIDENTIALITY: You and/or your care partner have signed paperwork which will be entered into your electronic medical record.  These signatures attest to the fact that that the information above on your After Visit Summary has been reviewed and is understood.  Full responsibility of the confidentiality of this discharge information lies with you and/or your care-partner.  Rowasa suppositories every night.  Repeat Flex sig in 2 years.

## 2016-06-15 NOTE — Progress Notes (Signed)
Called to room to assist during endoscopic procedure.  Patient ID and intended procedure confirmed with present staff. Received instructions for my participation in the procedure from the performing physician.  

## 2016-06-16 ENCOUNTER — Telehealth: Payer: Self-pay | Admitting: *Deleted

## 2016-06-16 NOTE — Telephone Encounter (Signed)
  Follow up Call-  Call back number 06/15/2016 07/08/2014  Post procedure Call Back phone  # [phone] cell  Permission to leave phone message Yes Yes  Some recent data might be hidden     Patient questions:  Do you have a fever, pain , or abdominal swelling? No. Pain Score  0 *  Have you tolerated food without any problems? Yes.    Have you been able to return to your normal activities? Yes.    Do you have any questions about your discharge instructions: Diet   No. Medications  No. Follow up visit  No.  Do you have questions or concerns about your Care? No.  Actions: * If pain score is 4 or above: No action needed, pain <4.

## 2016-06-22 ENCOUNTER — Encounter: Payer: Self-pay | Admitting: Internal Medicine

## 2016-12-22 DIAGNOSIS — F1210 Cannabis abuse, uncomplicated: Secondary | ICD-10-CM | POA: Diagnosis not present

## 2016-12-22 DIAGNOSIS — F1410 Cocaine abuse, uncomplicated: Secondary | ICD-10-CM | POA: Diagnosis not present

## 2017-01-02 DIAGNOSIS — F1410 Cocaine abuse, uncomplicated: Secondary | ICD-10-CM | POA: Diagnosis not present

## 2017-01-02 DIAGNOSIS — F1210 Cannabis abuse, uncomplicated: Secondary | ICD-10-CM | POA: Diagnosis not present

## 2017-01-04 DIAGNOSIS — F1210 Cannabis abuse, uncomplicated: Secondary | ICD-10-CM | POA: Diagnosis not present

## 2017-01-04 DIAGNOSIS — F1410 Cocaine abuse, uncomplicated: Secondary | ICD-10-CM | POA: Diagnosis not present

## 2017-01-16 DIAGNOSIS — F1410 Cocaine abuse, uncomplicated: Secondary | ICD-10-CM | POA: Diagnosis not present

## 2017-01-16 DIAGNOSIS — F1210 Cannabis abuse, uncomplicated: Secondary | ICD-10-CM | POA: Diagnosis not present

## 2017-01-18 DIAGNOSIS — F1410 Cocaine abuse, uncomplicated: Secondary | ICD-10-CM | POA: Diagnosis not present

## 2017-01-18 DIAGNOSIS — F1210 Cannabis abuse, uncomplicated: Secondary | ICD-10-CM | POA: Diagnosis not present

## 2017-01-23 DIAGNOSIS — F1210 Cannabis abuse, uncomplicated: Secondary | ICD-10-CM | POA: Diagnosis not present

## 2017-01-23 DIAGNOSIS — F1410 Cocaine abuse, uncomplicated: Secondary | ICD-10-CM | POA: Diagnosis not present

## 2017-01-26 DIAGNOSIS — F1210 Cannabis abuse, uncomplicated: Secondary | ICD-10-CM | POA: Diagnosis not present

## 2017-01-26 DIAGNOSIS — F1410 Cocaine abuse, uncomplicated: Secondary | ICD-10-CM | POA: Diagnosis not present

## 2017-01-30 DIAGNOSIS — F1210 Cannabis abuse, uncomplicated: Secondary | ICD-10-CM | POA: Diagnosis not present

## 2017-01-30 DIAGNOSIS — F1410 Cocaine abuse, uncomplicated: Secondary | ICD-10-CM | POA: Diagnosis not present

## 2017-02-01 DIAGNOSIS — F1410 Cocaine abuse, uncomplicated: Secondary | ICD-10-CM | POA: Diagnosis not present

## 2017-02-01 DIAGNOSIS — F1210 Cannabis abuse, uncomplicated: Secondary | ICD-10-CM | POA: Diagnosis not present

## 2017-02-06 DIAGNOSIS — F1410 Cocaine abuse, uncomplicated: Secondary | ICD-10-CM | POA: Diagnosis not present

## 2017-02-06 DIAGNOSIS — F1210 Cannabis abuse, uncomplicated: Secondary | ICD-10-CM | POA: Diagnosis not present

## 2017-02-08 DIAGNOSIS — F1210 Cannabis abuse, uncomplicated: Secondary | ICD-10-CM | POA: Diagnosis not present

## 2017-02-08 DIAGNOSIS — F1410 Cocaine abuse, uncomplicated: Secondary | ICD-10-CM | POA: Diagnosis not present

## 2017-02-15 DIAGNOSIS — F1410 Cocaine abuse, uncomplicated: Secondary | ICD-10-CM | POA: Diagnosis not present

## 2017-02-15 DIAGNOSIS — F1210 Cannabis abuse, uncomplicated: Secondary | ICD-10-CM | POA: Diagnosis not present

## 2017-02-16 DIAGNOSIS — F1210 Cannabis abuse, uncomplicated: Secondary | ICD-10-CM | POA: Diagnosis not present

## 2017-02-16 DIAGNOSIS — F1410 Cocaine abuse, uncomplicated: Secondary | ICD-10-CM | POA: Diagnosis not present

## 2017-02-20 DIAGNOSIS — F1210 Cannabis abuse, uncomplicated: Secondary | ICD-10-CM | POA: Diagnosis not present

## 2017-02-20 DIAGNOSIS — F1410 Cocaine abuse, uncomplicated: Secondary | ICD-10-CM | POA: Diagnosis not present

## 2017-02-22 DIAGNOSIS — F1210 Cannabis abuse, uncomplicated: Secondary | ICD-10-CM | POA: Diagnosis not present

## 2017-02-22 DIAGNOSIS — F1410 Cocaine abuse, uncomplicated: Secondary | ICD-10-CM | POA: Diagnosis not present

## 2017-02-27 DIAGNOSIS — F1410 Cocaine abuse, uncomplicated: Secondary | ICD-10-CM | POA: Diagnosis not present

## 2017-02-27 DIAGNOSIS — F1210 Cannabis abuse, uncomplicated: Secondary | ICD-10-CM | POA: Diagnosis not present

## 2017-03-01 DIAGNOSIS — F1210 Cannabis abuse, uncomplicated: Secondary | ICD-10-CM | POA: Diagnosis not present

## 2017-03-01 DIAGNOSIS — F1410 Cocaine abuse, uncomplicated: Secondary | ICD-10-CM | POA: Diagnosis not present

## 2017-03-06 DIAGNOSIS — F1210 Cannabis abuse, uncomplicated: Secondary | ICD-10-CM | POA: Diagnosis not present

## 2017-03-06 DIAGNOSIS — F1410 Cocaine abuse, uncomplicated: Secondary | ICD-10-CM | POA: Diagnosis not present

## 2017-03-08 DIAGNOSIS — F1210 Cannabis abuse, uncomplicated: Secondary | ICD-10-CM | POA: Diagnosis not present

## 2017-03-08 DIAGNOSIS — F1410 Cocaine abuse, uncomplicated: Secondary | ICD-10-CM | POA: Diagnosis not present

## 2017-03-13 DIAGNOSIS — F1410 Cocaine abuse, uncomplicated: Secondary | ICD-10-CM | POA: Diagnosis not present

## 2017-03-13 DIAGNOSIS — F1210 Cannabis abuse, uncomplicated: Secondary | ICD-10-CM | POA: Diagnosis not present

## 2017-06-20 DIAGNOSIS — F1220 Cannabis dependence, uncomplicated: Secondary | ICD-10-CM | POA: Diagnosis not present

## 2017-06-20 DIAGNOSIS — F1120 Opioid dependence, uncomplicated: Secondary | ICD-10-CM | POA: Diagnosis not present

## 2017-06-20 DIAGNOSIS — Z5181 Encounter for therapeutic drug level monitoring: Secondary | ICD-10-CM | POA: Diagnosis not present

## 2017-06-22 DIAGNOSIS — F1120 Opioid dependence, uncomplicated: Secondary | ICD-10-CM | POA: Diagnosis not present

## 2017-06-22 DIAGNOSIS — Z5181 Encounter for therapeutic drug level monitoring: Secondary | ICD-10-CM | POA: Diagnosis not present

## 2017-06-22 DIAGNOSIS — F1220 Cannabis dependence, uncomplicated: Secondary | ICD-10-CM | POA: Diagnosis not present

## 2017-06-26 DIAGNOSIS — Z5181 Encounter for therapeutic drug level monitoring: Secondary | ICD-10-CM | POA: Diagnosis not present

## 2017-06-26 DIAGNOSIS — F1220 Cannabis dependence, uncomplicated: Secondary | ICD-10-CM | POA: Diagnosis not present

## 2017-06-26 DIAGNOSIS — F1120 Opioid dependence, uncomplicated: Secondary | ICD-10-CM | POA: Diagnosis not present

## 2017-06-28 DIAGNOSIS — F1120 Opioid dependence, uncomplicated: Secondary | ICD-10-CM | POA: Diagnosis not present

## 2017-06-28 DIAGNOSIS — Z5181 Encounter for therapeutic drug level monitoring: Secondary | ICD-10-CM | POA: Diagnosis not present

## 2017-06-28 DIAGNOSIS — F1220 Cannabis dependence, uncomplicated: Secondary | ICD-10-CM | POA: Diagnosis not present

## 2017-07-03 DIAGNOSIS — F1120 Opioid dependence, uncomplicated: Secondary | ICD-10-CM | POA: Diagnosis not present

## 2017-07-03 DIAGNOSIS — F1220 Cannabis dependence, uncomplicated: Secondary | ICD-10-CM | POA: Diagnosis not present

## 2017-07-03 DIAGNOSIS — Z5181 Encounter for therapeutic drug level monitoring: Secondary | ICD-10-CM | POA: Diagnosis not present

## 2017-07-06 DIAGNOSIS — F1120 Opioid dependence, uncomplicated: Secondary | ICD-10-CM | POA: Diagnosis not present

## 2017-07-06 DIAGNOSIS — F1220 Cannabis dependence, uncomplicated: Secondary | ICD-10-CM | POA: Diagnosis not present

## 2017-07-06 DIAGNOSIS — Z5181 Encounter for therapeutic drug level monitoring: Secondary | ICD-10-CM | POA: Diagnosis not present

## 2017-07-11 DIAGNOSIS — Z5181 Encounter for therapeutic drug level monitoring: Secondary | ICD-10-CM | POA: Diagnosis not present

## 2017-07-11 DIAGNOSIS — F1120 Opioid dependence, uncomplicated: Secondary | ICD-10-CM | POA: Diagnosis not present

## 2017-07-11 DIAGNOSIS — F1220 Cannabis dependence, uncomplicated: Secondary | ICD-10-CM | POA: Diagnosis not present

## 2017-07-13 DIAGNOSIS — Z5181 Encounter for therapeutic drug level monitoring: Secondary | ICD-10-CM | POA: Diagnosis not present

## 2017-07-13 DIAGNOSIS — F1220 Cannabis dependence, uncomplicated: Secondary | ICD-10-CM | POA: Diagnosis not present

## 2017-07-13 DIAGNOSIS — F1120 Opioid dependence, uncomplicated: Secondary | ICD-10-CM | POA: Diagnosis not present

## 2017-07-17 DIAGNOSIS — F1120 Opioid dependence, uncomplicated: Secondary | ICD-10-CM | POA: Diagnosis not present

## 2017-07-17 DIAGNOSIS — Z5181 Encounter for therapeutic drug level monitoring: Secondary | ICD-10-CM | POA: Diagnosis not present

## 2017-07-17 DIAGNOSIS — F1220 Cannabis dependence, uncomplicated: Secondary | ICD-10-CM | POA: Diagnosis not present

## 2017-07-19 DIAGNOSIS — Z5181 Encounter for therapeutic drug level monitoring: Secondary | ICD-10-CM | POA: Diagnosis not present

## 2017-07-19 DIAGNOSIS — F1120 Opioid dependence, uncomplicated: Secondary | ICD-10-CM | POA: Diagnosis not present

## 2017-07-19 DIAGNOSIS — F1220 Cannabis dependence, uncomplicated: Secondary | ICD-10-CM | POA: Diagnosis not present

## 2017-07-24 DIAGNOSIS — F1220 Cannabis dependence, uncomplicated: Secondary | ICD-10-CM | POA: Diagnosis not present

## 2017-07-24 DIAGNOSIS — F1120 Opioid dependence, uncomplicated: Secondary | ICD-10-CM | POA: Diagnosis not present

## 2017-07-24 DIAGNOSIS — Z5181 Encounter for therapeutic drug level monitoring: Secondary | ICD-10-CM | POA: Diagnosis not present

## 2017-07-26 DIAGNOSIS — F1220 Cannabis dependence, uncomplicated: Secondary | ICD-10-CM | POA: Diagnosis not present

## 2017-07-26 DIAGNOSIS — Z5181 Encounter for therapeutic drug level monitoring: Secondary | ICD-10-CM | POA: Diagnosis not present

## 2017-07-26 DIAGNOSIS — F1120 Opioid dependence, uncomplicated: Secondary | ICD-10-CM | POA: Diagnosis not present

## 2017-07-28 ENCOUNTER — Other Ambulatory Visit: Payer: Self-pay

## 2017-07-28 ENCOUNTER — Emergency Department (HOSPITAL_COMMUNITY): Payer: Medicare HMO

## 2017-07-28 ENCOUNTER — Ambulatory Visit (HOSPITAL_BASED_OUTPATIENT_CLINIC_OR_DEPARTMENT_OTHER): Payer: Medicare HMO | Admitting: Family Medicine

## 2017-07-28 ENCOUNTER — Emergency Department (HOSPITAL_COMMUNITY)
Admission: EM | Admit: 2017-07-28 | Discharge: 2017-07-28 | Disposition: A | Discharge status: Home / Self Care | Payer: Medicare HMO | Attending: Emergency Medicine | Admitting: Emergency Medicine

## 2017-07-28 ENCOUNTER — Encounter: Payer: Self-pay | Admitting: Family Medicine

## 2017-07-28 VITALS — BP 130/83 | HR 97 | Temp 98.1°F | Resp 12 | Ht 69.0 in | Wt 200.0 lb

## 2017-07-28 DIAGNOSIS — Z79899 Other long term (current) drug therapy: Secondary | ICD-10-CM

## 2017-07-28 DIAGNOSIS — M791 Myalgia, unspecified site: Secondary | ICD-10-CM | POA: Diagnosis not present

## 2017-07-28 DIAGNOSIS — M2550 Pain in unspecified joint: Secondary | ICD-10-CM | POA: Diagnosis not present

## 2017-07-28 DIAGNOSIS — R109 Unspecified abdominal pain: Secondary | ICD-10-CM | POA: Diagnosis not present

## 2017-07-28 DIAGNOSIS — R531 Weakness: Secondary | ICD-10-CM | POA: Diagnosis not present

## 2017-07-28 DIAGNOSIS — Z85038 Personal history of other malignant neoplasm of large intestine: Secondary | ICD-10-CM | POA: Insufficient documentation

## 2017-07-28 DIAGNOSIS — I10 Essential (primary) hypertension: Secondary | ICD-10-CM | POA: Insufficient documentation

## 2017-07-28 DIAGNOSIS — Z87891 Personal history of nicotine dependence: Secondary | ICD-10-CM | POA: Diagnosis not present

## 2017-07-28 DIAGNOSIS — K529 Noninfective gastroenteritis and colitis, unspecified: Secondary | ICD-10-CM | POA: Diagnosis not present

## 2017-07-28 DIAGNOSIS — A09 Infectious gastroenteritis and colitis, unspecified: Secondary | ICD-10-CM | POA: Diagnosis not present

## 2017-07-28 DIAGNOSIS — R140 Abdominal distension (gaseous): Secondary | ICD-10-CM

## 2017-07-28 DIAGNOSIS — K6289 Other specified diseases of anus and rectum: Secondary | ICD-10-CM | POA: Insufficient documentation

## 2017-07-28 DIAGNOSIS — M25511 Pain in right shoulder: Secondary | ICD-10-CM

## 2017-07-28 DIAGNOSIS — R197 Diarrhea, unspecified: Secondary | ICD-10-CM

## 2017-07-28 DIAGNOSIS — R079 Chest pain, unspecified: Secondary | ICD-10-CM | POA: Diagnosis not present

## 2017-07-28 LAB — CBC WITH DIFFERENTIAL/PLATELET
BASOS ABS: 0.1 10*3/uL (ref 0.0–0.1)
BASOS PCT: 1 %
EOS ABS: 0 10*3/uL (ref 0.0–0.7)
Eosinophils Relative: 0 %
HCT: 42.8 % (ref 39.0–52.0)
HEMOGLOBIN: 14.4 g/dL (ref 13.0–17.0)
LYMPHS PCT: 41 %
Lymphs Abs: 3.2 10*3/uL (ref 0.7–4.0)
MCH: 28.4 pg (ref 26.0–34.0)
MCHC: 33.6 g/dL (ref 30.0–36.0)
MCV: 84.4 fL (ref 78.0–100.0)
Monocytes Absolute: 0.6 10*3/uL (ref 0.1–1.0)
Monocytes Relative: 8 %
NEUTROS ABS: 3.9 10*3/uL (ref 1.7–7.7)
NEUTROS PCT: 50 %
PLATELETS: 350 10*3/uL (ref 150–400)
RBC: 5.07 MIL/uL (ref 4.22–5.81)
RDW: 14.4 % (ref 11.5–15.5)
WBC: 7.8 10*3/uL (ref 4.0–10.5)

## 2017-07-28 LAB — C DIFFICILE QUICK SCREEN W PCR REFLEX
C DIFFICLE (CDIFF) ANTIGEN: NEGATIVE
C Diff interpretation: NOT DETECTED
C Diff toxin: NEGATIVE

## 2017-07-28 LAB — COMPREHENSIVE METABOLIC PANEL
ALT: 44 U/L (ref 17–63)
ANION GAP: 11 (ref 5–15)
AST: 74 U/L — ABNORMAL HIGH (ref 15–41)
Albumin: 3.1 g/dL — ABNORMAL LOW (ref 3.5–5.0)
Alkaline Phosphatase: 82 U/L (ref 38–126)
BUN: 13 mg/dL (ref 6–20)
CHLORIDE: 101 mmol/L (ref 101–111)
CO2: 22 mmol/L (ref 22–32)
Calcium: 8.6 mg/dL — ABNORMAL LOW (ref 8.9–10.3)
Creatinine, Ser: 1.07 mg/dL (ref 0.61–1.24)
GFR calc non Af Amer: >60 mL/min (ref >60)
GLUCOSE: 95 mg/dL (ref 65–99)
POTASSIUM: 4 mmol/L (ref 3.5–5.1)
SODIUM: 134 mmol/L — AB (ref 135–145)
Total Bilirubin: 0.7 mg/dL (ref 0.3–1.2)
Total Protein: 8 g/dL (ref 6.5–8.1)

## 2017-07-28 LAB — URINALYSIS, ROUTINE W REFLEX MICROSCOPIC
BACTERIA UA: NONE SEEN
BILIRUBIN URINE: NEGATIVE
GLUCOSE, UA: NEGATIVE mg/dL
Ketones, ur: NEGATIVE mg/dL
LEUKOCYTES UA: NEGATIVE
NITRITE: NEGATIVE
PH: 5 (ref 5.0–8.0)
Protein, ur: 30 mg/dL — AB
Specific Gravity, Urine: 1.02 (ref 1.005–1.030)
Squamous Epithelial / LPF: NONE SEEN

## 2017-07-28 LAB — LIPASE, BLOOD: Lipase: 25 U/L (ref 11–51)

## 2017-07-28 LAB — I-STAT TROPONIN, ED: TROPONIN I, POC: 0 ng/mL (ref 0.00–0.08)

## 2017-07-28 LAB — I-STAT CG4 LACTIC ACID, ED: Lactic Acid, Venous: 0.96 mmol/L (ref 0.5–1.9)

## 2017-07-28 MED ORDER — CIPROFLOXACIN HCL 500 MG PO TABS
500.0000 mg | ORAL_TABLET | Freq: Once | ORAL | Status: AC
Start: 2017-07-28 — End: 2017-07-28
  Administered 2017-07-28: 500 mg via ORAL
  Filled 2017-07-28: qty 1

## 2017-07-28 MED ORDER — METRONIDAZOLE 500 MG PO TABS: 500.0000 mg | ORAL_TABLET | Freq: Three times a day (TID) | ORAL | 0 refills | Status: AC

## 2017-07-28 MED ORDER — SODIUM CHLORIDE 0.9 % IV BOLUS (SEPSIS)
1000.0000 mL | Freq: Once | INTRAVENOUS | Status: AC
  Administered 2017-07-28: 1000 mL via INTRAVENOUS

## 2017-07-28 MED ORDER — METRONIDAZOLE IN NACL 5-0.79 MG/ML-% IV SOLN: 500.0000 mg | Freq: Once | INTRAVENOUS | Status: DC

## 2017-07-28 MED ORDER — TRAMADOL HCL 50 MG PO TABS: 50.0000 mg | ORAL_TABLET | Freq: Three times a day (TID) | ORAL | 0 refills | Status: DC | PRN

## 2017-07-28 MED ORDER — SIMETHICONE 80 MG PO CHEW: 80.0000 mg | CHEWABLE_TABLET | Freq: Four times a day (QID) | ORAL | 0 refills | Status: DC | PRN

## 2017-07-28 MED ORDER — DICYCLOMINE HCL 10 MG PO CAPS
20.0000 mg | ORAL_CAPSULE | Freq: Once | ORAL | Status: AC
  Administered 2017-07-28: 20 mg via ORAL
  Filled 2017-07-28: qty 2

## 2017-07-28 MED ORDER — METRONIDAZOLE 500 MG PO TABS
500.0000 mg | ORAL_TABLET | Freq: Once | ORAL | Status: AC
  Administered 2017-07-28: 500 mg via ORAL
  Filled 2017-07-28: qty 1

## 2017-07-28 MED ORDER — CIPROFLOXACIN HCL 500 MG PO TABS: 500.0000 mg | ORAL_TABLET | Freq: Two times a day (BID) | ORAL | 0 refills | Status: AC

## 2017-07-28 MED ORDER — IOPAMIDOL (ISOVUE-300) INJECTION 61%
INTRAVENOUS | Status: AC
  Administered 2017-07-28: 100 mL via INTRAVENOUS
  Filled 2017-07-28: qty 100

## 2017-07-28 MED ORDER — ONDANSETRON HCL 4 MG PO TABS: 4.0000 mg | ORAL_TABLET | Freq: Three times a day (TID) | ORAL | 0 refills | Status: DC | PRN

## 2017-07-28 MED ORDER — LOPERAMIDE HCL 2 MG PO TABS: 2.0000 mg | ORAL_TABLET | Freq: Four times a day (QID) | ORAL | 0 refills | Status: DC | PRN

## 2017-07-28 MED ORDER — LOPERAMIDE HCL 2 MG PO CAPS: 2.0000 mg | ORAL_CAPSULE | Freq: Four times a day (QID) | ORAL | 0 refills | Status: DC | PRN

## 2017-07-28 MED ORDER — LOPERAMIDE HCL 2 MG PO CAPS
4.0000 mg | ORAL_CAPSULE | Freq: Once | ORAL | Status: AC
  Administered 2017-07-28: 4 mg via ORAL
  Filled 2017-07-28: qty 2

## 2017-07-28 MED ORDER — ONDANSETRON HCL 4 MG/2ML IJ SOLN
4.0000 mg | Freq: Once | INTRAMUSCULAR | Status: AC
  Administered 2017-07-28: 4 mg via INTRAVENOUS
  Filled 2017-07-28: qty 2

## 2017-07-28 MED ORDER — MORPHINE SULFATE (PF) 4 MG/ML IV SOLN
4.0000 mg | Freq: Once | INTRAVENOUS | Status: AC
  Administered 2017-07-28: 4 mg via INTRAVENOUS
  Filled 2017-07-28: qty 1

## 2017-07-28 MED ORDER — CIPROFLOXACIN IN D5W 400 MG/200ML IV SOLN: 400.0000 mg | Freq: Once | INTRAVENOUS | Status: DC

## 2017-07-28 MED FILL — traMADol HCL 50 MG TABS: 50 | 13 days supply | Qty: 40 | Fill #0

## 2017-07-28 MED FILL — ONDANSETRON HCL 4 MG TABLET: 4 | 6 days supply | Qty: 20 | Fill #0

## 2017-07-28 MED FILL — ?CIPROFLOXACIN HCL 500MG TA: 500 | 10 days supply | Qty: 20 | Fill #0

## 2017-07-28 MED FILL — ?METRONIDAZOLE 500MG TABS: 500 | 10 days supply | Qty: 30 | Fill #0

## 2017-07-28 NOTE — Progress Notes (Signed)
Day two of diarrhea Cold/flu sx's prior to Generalized cramps

## 2017-07-28 NOTE — ED Provider Notes (Signed)
[facility] EMERGENCY DEPARTMENT Provider Note   CSN: [id number] Arrival date & time: 07/28/17  1227     History   Chief Complaint Chief Complaint  Patient presents with  . Diarrhea    HPI Robert Rhodes is a 72 y.o. male.  HPI   72 year old male with history of ulcerative colitis status post subotal colectomy here with diarrhea.  The patient states that over the last week, he started having a cold with cough and nasal congestion at the beginning of the week.  The symptoms resolved.  However, over the last 3 days, is developed progressively worsening diffuse abdominal cramping, nausea, and profuse diarrhea.  He has had difficulty eating and drinking to keep up with his diarrhea.  He said no fevers.  His abdominal pain is cramp-like and comes and goes.  It is not constant.  He has not had any fevers.  He continues to urinate without difficulty.  Denies any blood in his stool.  No specific alleviating or aggravating factors.  Past Medical History:  Diagnosis Date  . Adenomatous colon polyp   . Arthritis   . Claustrophobia   . Colon cancer ([place])   . Eye abnormalities    right eye seen in ED 02/17/2014 wearing eye patch using eye drops  . GERD (gastroesophageal reflux disease)   . Osteoarthritis    bilat knees  needs replacement  . Osteoporosis    pt denies  . Substance abuse ([place])   . UC (ulcerative colitis) ([place])   . Vitamin D deficiency     Patient Active Problem List   Diagnosis Date Noted  . Osteoporosis   . Ulcerative colitis, with rectal bleeding 08/06/2015  . Gastroesophageal reflux disease without esophagitis 08/06/2015  . Visual impairment of right eye 06/18/2015  . Essential hypertension, benign 06/18/2015  . Hernia of abdominal cavity 11/27/2014  . Bipolar 1 disorder, mixed, moderate ([place]) 11/27/2014  . Protein-calorie malnutrition, severe ([place]) 08/24/2014  . Cellulitis 08/23/2014  . Medically noncompliant 08/23/2014  . Left leg  cellulitis 08/23/2014  . Cellulitis of left leg   . Hypokalemia   . Diarrhea 05/06/2014  . Ulcerative colitis ([place]) 04/17/2014  . Verrucous keratosis 04/17/2014  . Nonspecific (abnormal) findings on radiological and other examination of gastrointestinal tract 07/18/2013  . Acute esophagitis 07/18/2013  . Colon cancer ([place]) 07/16/2013  . Hypovitaminosis D 05/06/2013  . Ulcerative colitis, unspecified 04/04/2013  . Knee pain, bilateral 03/05/2013    Past Surgical History:  Procedure Laterality Date  . COLON RESECTION N/A 08/23/2013   Procedure: Laparoscopic total abdominal colectomy and hernia repair;  Surgeon: Leighton Ruff, MD;  Location: [facility];  Service: General;  Laterality: N/A;  . COLON SURGERY    . COLONOSCOPY    . EUS N/A 07/18/2013   Procedure: UPPER ENDOSCOPIC ULTRASOUND (EUS) RADIAL;  Surgeon: Milus Banister, MD;  Location: [facility];  Service: Endoscopy;  Laterality: N/A;  . EYE SURGERY    . HERNIA REPAIR    . INCISIONAL HERNIA REPAIR N/A 02/20/2014   Procedure: LAP ASSISTED INCISIONAL HERNIA REPAIR LYSIS OF ADHESIONS;  Surgeon: Leighton Ruff, MD;  Location: [facility];  Service: General;  Laterality: N/A;  converted to open @ 0935   . INCISIONAL HERNIA REPAIR N/A 02/20/2014   Procedure: HERNIA REPAIR INCISIONAL;  Surgeon: Leighton Ruff, MD;  Location: [facility];  Service: General;  Laterality: N/A;  With MESH  . INTRAOCULAR LENS INSERTION Bilateral    6 yrs ago  . VENTRAL HERNIA  REPAIR         Home Medications    Prior to Admission medications   Medication Sig Start Date End Date Taking? Authorizing Provider  ciprofloxacin (CIPRO) 500 MG tablet Take 1 tablet (500 mg total) by mouth 2 (two) times daily for 10 days. 07/28/17 08/07/17  Duffy Bruce, MD  loperamide (IMODIUM) 2 MG capsule Take 1 capsule (2 mg total) by mouth 4 (four) times daily as needed for diarrhea or loose stools. 07/28/17   Duffy Bruce, MD  mesalamine (ROWASA) 4 g enema Place 60 mLs (4 g total)  rectally at bedtime. Patient not taking: Reported on 07/28/2017 06/15/16   Pyrtle, Lajuan Lines, MD  metroNIDAZOLE (FLAGYL) 500 MG tablet Take 1 tablet (500 mg total) by mouth 3 (three) times daily for 10 days. 07/28/17 08/07/17  Duffy Bruce, MD  Multiple Vitamin (MULTIVITAMIN WITH MINERALS) TABS tablet Take 1 tablet by mouth daily at 12 noon. Patient not taking: Reported on 07/28/2017 08/26/14   Jonetta Osgood, MD  ondansetron (ZOFRAN) 4 MG tablet Take 1 tablet (4 mg total) by mouth every 8 (eight) hours as needed for nausea or vomiting. Patient not taking: Reported on 07/28/2017 07/28/17   Alfonse Spruce, FNP  pantoprazole (PROTONIX) 40 MG tablet Take 1 tablet (40 mg total) by mouth daily. Patient not taking: Reported on 07/28/2017 08/06/15   Tresa Garter, MD  simethicone (MYLICON) 80 MG chewable tablet Chew 1 tablet (80 mg total) by mouth every 6 (six) hours as needed for flatulence. Patient not taking: Reported on 07/28/2017 07/28/17   Alfonse Spruce, FNP  traMADol (ULTRAM) 50 MG tablet Take 1 tablet (50 mg total) by mouth every 8 (eight) hours as needed for severe pain. Patient not taking: Reported on 07/28/2017 07/28/17   Alfonse Spruce, FNP    Family History Family History  Problem Relation Age of Onset  . Cancer Mother        type unknown  . Diabetes Father   . Heart disease Father   . Heart disease Sister   . Heart attack Brother   . Colon cancer Neg Hx   . Esophageal cancer Neg Hx   . Rectal cancer Neg Hx   . Stomach cancer Neg Hx     Social History Social History   Tobacco Use  . Smoking status: Former Smoker    Packs/day: 0.00    Types: Cigarettes    Last attempt to quit: 06/14/2003    Years since quitting: 14.1  . Smokeless tobacco: Never Used  . Tobacco comment: quit in July 2005  Substance Use Topics  . Alcohol use: No  . Drug use: Yes    Types: Marijuana    Comment: current,06/15/16 STATED 14MONTHS CLEAN     Allergies   Ace inhibitors; Advil  [ibuprofen]; Aspirin; and Tylenol [acetaminophen]   Review of Systems Review of Systems  Constitutional: Positive for fatigue. Negative for chills and fever.  HENT: Negative for congestion and rhinorrhea.   Eyes: Negative for visual disturbance.  Respiratory: Negative for cough, shortness of breath and wheezing.   Cardiovascular: Negative for chest pain and leg swelling.  Gastrointestinal: Positive for abdominal pain, diarrhea and nausea. Negative for vomiting.  Genitourinary: Negative for dysuria and flank pain.  Musculoskeletal: Negative for neck pain and neck stiffness.  Skin: Negative for rash and wound.  Allergic/Immunologic: Negative for immunocompromised state.  Neurological: Positive for weakness. Negative for syncope and headaches.  All other systems reviewed and are negative.  Physical Exam Updated Vital Signs BP 131/64   Pulse 78   Temp 98.2 F (36.8 C) (Oral)   Resp 17   Ht 5' 11"  (1.803 m) Comment: Simultaneous filing. User may not have seen previous data.  Wt 90.7 kg (200 lb) Comment: Simultaneous filing. User may not have seen previous data.  SpO2 100%   BMI 27.89 kg/m   Physical Exam  Constitutional: He is oriented to person, place, and time. He appears well-developed and well-nourished. No distress.  HENT:  Head: Normocephalic and atraumatic.  Eyes: Conjunctivae are normal.  Neck: Neck supple.  Cardiovascular: Normal rate, regular rhythm and normal heart sounds. Exam reveals no friction rub.  No murmur heard. Pulmonary/Chest: Effort normal and breath sounds normal. No respiratory distress. He has no wheezes. He has no rales.  Abdominal: Soft. Bowel sounds are normal. He exhibits no distension.  Musculoskeletal: He exhibits no edema.  Neurological: He is alert and oriented to person, place, and time. He exhibits normal muscle tone.  Skin: Skin is warm. Capillary refill takes less than 2 seconds.  Psychiatric: He has a normal mood and affect.  Nursing  note and vitals reviewed.    ED Treatments / Results  Labs (all labs ordered are listed, but only abnormal results are displayed) Labs Reviewed  COMPREHENSIVE METABOLIC PANEL - Abnormal; Notable for the following components:      Result Value   Sodium 134 (*)    Calcium 8.6 (*)    Albumin 3.1 (*)    AST 74 (*)    All other components within normal limits  URINALYSIS, ROUTINE W REFLEX MICROSCOPIC - Abnormal; Notable for the following components:   Color, Urine AMBER (*)    Hgb urine dipstick SMALL (*)    Protein, ur 30 (*)    All other components within normal limits  C DIFFICILE QUICK SCREEN W PCR REFLEX  GASTROINTESTINAL PANEL BY PCR, STOOL (REPLACES STOOL CULTURE)  CBC WITH DIFFERENTIAL/PLATELET  LIPASE, BLOOD  I-STAT CG4 LACTIC ACID, ED  I-STAT TROPONIN, ED  I-STAT CG4 LACTIC ACID, ED    EKG  EKG Interpretation  Date/Time:  Friday July 28 2017 12:37:59 EST Ventricular Rate:  83 PR Interval:    QRS Duration: 76 QT Interval:  376 QTC Calculation: 442 R Axis:   55 Text Interpretation:  Unknown rhythm, irregular rate Borderline short PR interval No significant change since last tracing Confirmed by Duffy Bruce [phone]) on 07/28/2017 1:31:21 PM       Radiology Ct Abdomen Pelvis W Contrast  Result Date: 07/28/2017 CLINICAL DATA:  Abdominal cramping for 4 days. History of ulcerative colitis and colectomy. EXAM: CT ABDOMEN AND PELVIS WITH CONTRAST TECHNIQUE: Multidetector CT imaging of the abdomen and pelvis was performed using the standard protocol following bolus administration of intravenous contrast. CONTRAST:  100 cc Isovue-300 intravenous COMPARISON:  06/28/2013 FINDINGS: Lower chest: Chronic enlargement of periesophageal lymph nodes, 13 mm short axis right of the lower esophagus. No acute finding. Hepatobiliary: Heterogeneous liver. There is a cyst in the caudate lobe that is essentially stable. No evidence of metastatic disease.No evidence of biliary  obstruction or stone. Pancreas: Unremarkable. Spleen: Unremarkable. Adrenals/Urinary Tract: Negative adrenals. No hydronephrosis or stone. Unremarkable bladder. Stomach/Bowel: Rectal mucosal hyperenhancement and mild wall thickening. Mesial rectal adenopathy with rounded nodes measuring up to 14 mm at the level of the enterocolonic anastomosis. Given the history, there is presumably rectal surveillance for tumor. No small bowel inflammation or obstruction seen. Vascular/Lymphatic: No acute vascular abnormality. Mild atherosclerotic  calcification of the aorta. No mass or adenopathy. Reproductive:Negative. Other: No ascites or pneumoperitoneum. Scarring in place of previously epigastric hernia. Musculoskeletal: No acute abnormalities. Advanced lumbar spondylosis and disc degeneration. There is L5-S1 ankylosis. Diffuse thoracic spondylosis with foraminal narrowing. Right more than left hip osteoarthritis. IMPRESSION: Proctitis with mesorectal adenopathy. History of ulcerative colitis and total colectomy. Electronically Signed   By: Monte Fantasia M.D.   On: 07/28/2017 15:21    Procedures Procedures (including critical care time)  Medications Ordered in ED Medications  sodium chloride 0.9 % bolus 1,000 mL (1,000 mLs Intravenous New Bag/Given 07/28/17 1534)  sodium chloride 0.9 % bolus 1,000 mL (0 mLs Intravenous Stopped 07/28/17 1530)  iopamidol (ISOVUE-300) 61 % injection (100 mLs Intravenous Contrast Given 07/28/17 1450)  morphine 4 MG/ML injection 4 mg (4 mg Intravenous Given 07/28/17 1527)  ondansetron (ZOFRAN) injection 4 mg (4 mg Intravenous Given 07/28/17 1527)  dicyclomine (BENTYL) capsule 20 mg (20 mg Oral Given 07/28/17 1527)  loperamide (IMODIUM) capsule 4 mg (4 mg Oral Given 07/28/17 1532)  ciprofloxacin (CIPRO) tablet 500 mg (500 mg Oral Given 07/28/17 1607)  metroNIDAZOLE (FLAGYL) tablet 500 mg (500 mg Oral Given 07/28/17 1607)     Initial Impression / Assessment and Plan / ED Course  I have  reviewed the triage vital signs and the nursing notes.  Pertinent labs & imaging results that were available during my care of the patient were reviewed by me and considered in my medical decision making (see chart for details).   72 year old male with past medical history as above subtotal colectomy here with profuse diarrhea.  No recent antibiotic use.  Abdomen is completely soft and nontender.  White count normal and renal function is at baseline.  He is tolerating p.o. in the ED.  CT scan shows focal colitis.  I suspect this is the etiology of his symptoms.  Likely viral but I discussed with Damita Dunnings of [facility] - reviewed labs, imaging.  GI recommends Cipro/Flagyl and outpatient treatment with good return precautions.  He has had an outpatient appointment with GI set up.  Good return precautions given.  Patient given first dose here.   Final Clinical Impressions(s) / ED Diagnoses   Final diagnoses:  Diarrhea of presumed infectious origin  Proctitis    ED Discharge Orders        Ordered    ciprofloxacin (CIPRO) 500 MG tablet  2 times daily     07/28/17 1555    metroNIDAZOLE (FLAGYL) 500 MG tablet  3 times daily     07/28/17 1555    loperamide (IMODIUM) 2 MG capsule  4 times daily PRN     07/28/17 1555       Duffy Bruce, MD 07/28/17 1621

## 2017-07-28 NOTE — Patient Instructions (Addendum)
Go to ED  Food Choices to Help Relieve Diarrhea, Adult When you have diarrhea, the foods you eat and your eating habits are very important. Choosing the right foods and drinks can help:  Relieve diarrhea.  Replace lost fluids and nutrients.  Prevent dehydration.  What general guidelines should I follow? Relieving diarrhea  Choose foods with less than 2 g or .07 oz. of fiber per serving.  Limit fats to less than 8 tsp (38 g or 1.34 oz.) a day.  Avoid the following: ? Foods and beverages sweetened with high-fructose corn syrup, honey, or sugar alcohols such as xylitol, sorbitol, and mannitol. ? Foods that contain a lot of fat or sugar. ? Fried, greasy, or spicy foods. ? High-fiber grains, breads, and cereals. ? Raw fruits and vegetables.  Eat foods that are rich in probiotics. These foods include dairy products such as yogurt and fermented milk products. They help increase healthy bacteria in the stomach and intestines (gastrointestinal tract, or GI tract).  If you have lactose intolerance, avoid dairy products. These may make your diarrhea worse.  Take medicine to help stop diarrhea (antidiarrheal medicine) only as told by your health care provider. Replacing nutrients  Eat small meals or snacks every 3-4 hours.  Eat bland foods, such as white rice, toast, or baked potato, until your diarrhea starts to get better. Gradually reintroduce nutrient-rich foods as tolerated or as told by your health care provider. This includes: ? Well-cooked protein foods. ? Peeled, seeded, and soft-cooked fruits and vegetables. ? Low-fat dairy products.  Take vitamin and mineral supplements as told by your health care provider. Preventing dehydration   Start by sipping water or a special solution to prevent dehydration (oral rehydration solution, ORS). Urine that is clear or pale yellow means that you are getting enough fluid.  Try to drink at least 8-10 cups of fluid each day to help replace  lost fluids.  You may add other liquids in addition to water, such as clear juice or decaffeinated sports drinks, as tolerated or as told by your health care provider.  Avoid drinks with caffeine, such as coffee, tea, or soft drinks.  Avoid alcohol. What foods are recommended? The items listed may not be a complete list. Talk with your health care provider about what dietary choices are best for you. Grains White rice. White, [place], or pita breads (fresh or toasted), including plain rolls, buns, or bagels. White pasta. Saltine, soda, or graham crackers. Pretzels. Low-fiber cereal. Cooked cereals made with water (such as cornmeal, farina, or cream cereals). Plain muffins. Matzo. Melba toast. Zwieback. Vegetables Potatoes (without the skin). Most well-cooked and canned vegetables without skins or seeds. Tender lettuce. Fruits Apple sauce. Fruits canned in juice. Cooked apricots, cherries, grapefruit, peaches, pears, or plums. Fresh bananas and cantaloupe. Meats and other protein foods Baked or boiled chicken. Eggs. Tofu. Fish. Seafood. Smooth nut butters. Ground or well-cooked tender beef, ham, veal, lamb, pork, or poultry. Dairy Plain yogurt, kefir, and unsweetened liquid yogurt. Lactose-free milk, buttermilk, skim milk, or soy milk. Low-fat or nonfat hard cheese. Beverages Water. Low-calorie sports drinks. Fruit juices without pulp. Strained tomato and vegetable juices. Decaffeinated teas. Sugar-free beverages not sweetened with sugar alcohols. Oral rehydration solutions, if approved by your health care provider. Seasoning and other foods Bouillon, broth, or soups made from recommended foods. What foods are not recommended? The items listed may not be a complete list. Talk with your health care provider about what dietary choices are best for you. Grains  Whole grain, whole wheat, bran, or rye breads, rolls, pastas, and crackers. Wild or brown rice. Whole grain or bran cereals. Barley. Oats  and oatmeal. Corn tortillas or taco shells. Granola. Popcorn. Vegetables Raw vegetables. Fried vegetables. Cabbage, broccoli, Brussels sprouts, artichokes, baked beans, beet greens, corn, kale, legumes, peas, sweet potatoes, and yams. Potato skins. Cooked spinach and cabbage. Fruits Dried fruit, including raisins and dates. Raw fruits. Stewed or dried prunes. Canned fruits with syrup. Meat and other protein foods Fried or fatty meats. Deli meats. Chunky nut butters. Nuts and seeds. Beans and lentils. Berniece Salines. Hot dogs. Sausage. Dairy High-fat cheeses. Whole milk, chocolate milk, and beverages made with milk, such as milk shakes. Half-and-half. Cream. sour cream. Ice cream. Beverages Caffeinated beverages (such as coffee, tea, soda, or energy drinks). Alcoholic beverages. Fruit juices with pulp. Prune juice. Soft drinks sweetened with high-fructose corn syrup or sugar alcohols. High-calorie sports drinks. Fats and oils Butter. Cream sauces. Margarine. Salad oils. Plain salad dressings. Olives. Avocados. Mayonnaise. Sweets and desserts Sweet rolls, doughnuts, and sweet breads. Sugar-free desserts sweetened with sugar alcohols such as xylitol and sorbitol. Seasoning and other foods Honey. Hot sauce. Chili powder. Gravy. Cream-based or milk-based soups. Pancakes and waffles. Summary  When you have diarrhea, the foods you eat and your eating habits are very important.  Make sure you get at least 8-10 cups of fluid each day, or enough to keep your urine clear or pale yellow.  Eat bland foods and gradually reintroduce healthy, nutrient-rich foods as tolerated, or as told by your health care provider.  Avoid high-fiber, fried, greasy, or spicy foods. This information is not intended to replace advice given to you by your health care provider. Make sure you discuss any questions you have with your health care provider. Document Released: 08/20/2003 Document Revised: 05/27/2016 Document Reviewed:  05/27/2016 Elsevier Interactive Patient Education  [employer].

## 2017-07-28 NOTE — ED Triage Notes (Signed)
Patient has had diarrhea for the past week and today has had 10 plus bowel movements. Patient was at clinic and clinic called EMS after patient stated chest tightness.

## 2017-07-28 NOTE — Progress Notes (Signed)
Subjective:  Patient ID: Robert Rhodes, male    DOB: 1945-10-03  Age: 72 y.o. MRN: [id number]  CC: Diarrhea   HPI Robert Rhodes presents for complaint so frequent diarrhea. He reports symptoms began yesterday and have persisted. He reports 10 episodes of diarrhea this morning alone. Associated symptoms include abdominal cramping and bloating. He reports recent history of sick contacts with similar symptoms. He reports history of cold like symptoms a couple of weeks ago which had resolved, however he began to experience symptoms of diarrhea today. He denies any new foods. History of UC and colon cancer he denies any hematochezia or melena. He denies any fevers, vomiting, or abdominal pain. He reports only tolerating soup and liquids, no solid foods. He c/o bilateral shoulder pain w/ hand pain. NKI. Denies any joint swelling.    No current facility-administered medications on file prior to visit.    Current Outpatient Medications on File Prior to Visit  Medication Sig Dispense Refill  . Multiple Vitamin (MULTIVITAMIN WITH MINERALS) TABS tablet Take 1 tablet by mouth daily at 12 noon.    . mesalamine (ROWASA) 4 g enema Place 60 mLs (4 g total) rectally at bedtime. (Patient not taking: Reported on 07/28/2017) 30 Bottle 11  . pantoprazole (PROTONIX) 40 MG tablet Take 1 tablet (40 mg total) by mouth daily. (Patient not taking: Reported on 07/28/2017) 90 tablet 4     ROS Review of Systems  HENT: Negative.   Respiratory: Negative.   Cardiovascular: Negative.   Gastrointestinal: Positive for abdominal distention (bloating) and diarrhea. Negative for abdominal pain, blood in stool and vomiting.  Musculoskeletal: Positive for arthralgias and myalgias.  Skin: Negative.   Neurological: Positive for weakness. Negative for dizziness.  Psychiatric/Behavioral: Negative.     Objective:  BP 130/83 (BP Location: Left Arm, Patient Position: Sitting, Cuff Size: Large)   Pulse 97   Temp 98.1 F  (36.7 C) (Oral)   Resp 12   Ht 5' 9"  (1.753 m)   Wt 200 lb (90.7 kg)   SpO2 97%   BMI 29.53 kg/m   BP/Weight 07/28/2017 8/75/6433 07/22/5186  Systolic BP 416 606 301  Diastolic BP 73 83 85  Wt. (Lbs) 200 200 207  BMI 27.89 29.53 30.57     Physical Exam  Constitutional: He is oriented to person, place, and time. He appears well-developed and well-nourished.  HENT:  Head: Normocephalic and atraumatic.  Right Ear: External ear normal.  Left Ear: External ear normal.  Mouth/Throat: Oropharynx is clear and moist. Mucous membranes are pale and dry.  Eyes: Conjunctivae are normal.  Cardiovascular: Normal rate, regular rhythm, normal heart sounds and intact distal pulses.  Pulmonary/Chest: Effort normal and breath sounds normal.  Abdominal: Soft. Bowel sounds are increased. There is no tenderness.  Musculoskeletal:       Right shoulder: He exhibits pain.       Left shoulder: He exhibits pain.  Generalized weakness  Neurological: He is alert and oriented to person, place, and time.  Skin: Skin is warm and dry.  Psychiatric: He has a normal mood and affect.  Nursing note and vitals reviewed.    Assessment & Plan:   1. Gastroenteritis Based on worsening of presenting symptoms. Labs d/c and patient referred to ED. Called [facility] department and gave charge nurse [place], RN report at 11:48am. Pt.began to report worsening symptoms EMS was called, pt. then started to experience new onset SOB/ chest pain in office, EMS arrived and transported patient. - loperamide (IMODIUM  A-D) 2 MG tablet; Take 1 tablet (2 mg total) by mouth 4 (four) times daily as needed for diarrhea or loose stools.  Dispense: 30 tablet; Refill: 0 - ondansetron (ZOFRAN) 4 MG tablet; Take 1 tablet (4 mg total) by mouth every 8 (eight) hours as needed for nausea or vomiting.  Dispense: 20 tablet; Refill: 0  2. Myalgia During course of office visit patient began to report symptoms of myalgias and weakness. Concern for  dehydration complications and electrolytle imbalance due to history of frequent diarrhea. He was referred to ED. Referred to ED.  3. Weakness Referred to ED.  4. Abdominal bloating with cramps  - simethicone (MYLICON) 80 MG chewable tablet; Chew 1 tablet (80 mg total) by mouth every 6 (six) hours as needed for flatulence.  Dispense: 30 tablet; Refill: 0  5. Arthralgia, unspecified joint  - traMADol (ULTRAM) 50 MG tablet; Take 1 tablet (50 mg total) by mouth every 8 (eight) hours as needed for severe pain.  Dispense: 40 tablet; Refill: 0      Follow-up:  Go to ED .   Alfonse Spruce FNP

## 2017-07-28 NOTE — ED Notes (Signed)
ED Provider at bedside. 

## 2017-07-28 NOTE — Discharge Instructions (Signed)
Drink plenty of fluids.  Take the antibiotics as prescribed.  Follow-up with your gastroenterologist as above  If you begin to experience bloody diarrhea, return to the ER immediately

## 2017-07-29 LAB — GASTROINTESTINAL PANEL BY PCR, STOOL (REPLACES STOOL CULTURE)
ADENOVIRUS F40/41: NOT DETECTED
ASTROVIRUS: NOT DETECTED
CAMPYLOBACTER SPECIES: NOT DETECTED
Cryptosporidium: NOT DETECTED
Cyclospora cayetanensis: NOT DETECTED
ENTEROPATHOGENIC E COLI (EPEC): NOT DETECTED
Entamoeba histolytica: NOT DETECTED
Enteroaggregative E coli (EAEC): NOT DETECTED
Enterotoxigenic E coli (ETEC): NOT DETECTED
Giardia lamblia: NOT DETECTED
NOROVIRUS GI/GII: NOT DETECTED
PLESIMONAS SHIGELLOIDES: NOT DETECTED
ROTAVIRUS A: NOT DETECTED
SAPOVIRUS (I, II, IV, AND V): NOT DETECTED
SHIGA LIKE TOXIN PRODUCING E COLI (STEC): NOT DETECTED
Salmonella species: NOT DETECTED
Shigella/Enteroinvasive E coli (EIEC): NOT DETECTED
Vibrio cholerae: NOT DETECTED
Vibrio species: NOT DETECTED
Yersinia enterocolitica: NOT DETECTED

## 2017-08-01 DIAGNOSIS — F1220 Cannabis dependence, uncomplicated: Secondary | ICD-10-CM | POA: Diagnosis not present

## 2017-08-01 DIAGNOSIS — Z5181 Encounter for therapeutic drug level monitoring: Secondary | ICD-10-CM | POA: Diagnosis not present

## 2017-08-01 DIAGNOSIS — F1120 Opioid dependence, uncomplicated: Secondary | ICD-10-CM | POA: Diagnosis not present

## 2017-08-03 ENCOUNTER — Encounter: Payer: Self-pay | Admitting: Physician Assistant

## 2017-08-03 ENCOUNTER — Ambulatory Visit (INDEPENDENT_AMBULATORY_CARE_PROVIDER_SITE_OTHER): Payer: Medicare HMO | Admitting: Physician Assistant

## 2017-08-03 VITALS — BP 108/60 | HR 80 | Ht 69.0 in | Wt 208.0 lb

## 2017-08-03 DIAGNOSIS — K219 Gastro-esophageal reflux disease without esophagitis: Secondary | ICD-10-CM

## 2017-08-03 DIAGNOSIS — Z8719 Personal history of other diseases of the digestive system: Secondary | ICD-10-CM | POA: Diagnosis not present

## 2017-08-03 DIAGNOSIS — R197 Diarrhea, unspecified: Secondary | ICD-10-CM

## 2017-08-03 DIAGNOSIS — Z5181 Encounter for therapeutic drug level monitoring: Secondary | ICD-10-CM | POA: Diagnosis not present

## 2017-08-03 DIAGNOSIS — Z85038 Personal history of other malignant neoplasm of large intestine: Secondary | ICD-10-CM

## 2017-08-03 DIAGNOSIS — F1220 Cannabis dependence, uncomplicated: Secondary | ICD-10-CM | POA: Diagnosis not present

## 2017-08-03 DIAGNOSIS — F1120 Opioid dependence, uncomplicated: Secondary | ICD-10-CM | POA: Diagnosis not present

## 2017-08-03 MED ORDER — MESALAMINE 1000 MG RE SUPP: 1000.0000 mg | Freq: Every day | RECTAL | 3 refills | Status: DC

## 2017-08-03 NOTE — Patient Instructions (Signed)
We have sent the following medications to your pharmacy for you to pick up at your convenience:  canasa suppositories rectally at bedtime  Finish your antibiotic.

## 2017-08-03 NOTE — Progress Notes (Addendum)
Chief Complaint: Proctitis  HPI:    Mr. Ledwith is a 72 year old African-American male with a past medical history of ulcerative colitis, colon cancer status post subtotal colectomy with ileorectal anastomosis in March 2015, GERD with history of esophagitis, hiatal hernia who presents to clinic today for follow-up after being seen in the ER for presumed acute infectious colitis.    Last seen in clinic 08/21/15 by Dr. Hilarie Fredrickson at that time doing fairly well.  Started on Rowasa enemas 4 g daily at bedtime.  Last flex sig 06/15/16 with patent end-to-end ileorectal anastomosis characterized by healthy-appearing mucosa, terminal ileum normal, diffuse moderate inflammation was found the rectum secondary to proctitis ulcerative colitis.  Repeat recommended in 2 years. Recommended patient resume Rowasa 4 g proctitis.    ED 07/28/17 - 3 days of progressively worsening diffuse abdominal cramping, nausea and profuse diarrhea.  CBC normal, CMP with AST of 74.  CT abdomen pelvis with proctitis with mesorectal adenopathy.  Diiagnosed with focal colitis, thought the etiology of his symptoms.  GI path panel negative.  C. difficile quick skin negative.  Apparently via phone Judson Roch was consulted from our service and recommended Cipro/Flagyl.    Today, his symptoms are completely relieved, now back to his normal bowel movements,no longer loose.  He denies any rectal pain or discomfort.  Was trying to use his Rowasa enemas but "the way these are made they hurt my rectum when inserted".  Asked if there was anything he can use in their place.  He does have 2 days left of antibiotics.  Denies other complaints.    Denies fever, chills, blood in his stool, melena, weight loss, heartburn or reflux.  Past Medical History:  Diagnosis Date  . Adenomatous colon polyp   . Arthritis   . Claustrophobia   . Colon cancer ([place])   . Eye abnormalities    right eye seen in ED 02/17/2014 wearing eye patch using eye drops  . GERD  (gastroesophageal reflux disease)   . Osteoarthritis    bilat knees  needs replacement  . Osteoporosis    pt denies  . Substance abuse ([place])   . UC (ulcerative colitis) ([place])   . Vitamin D deficiency     Past Surgical History:  Procedure Laterality Date  . COLON RESECTION N/A 08/23/2013   Procedure: Laparoscopic total abdominal colectomy and hernia repair;  Surgeon: Leighton Ruff, MD;  Location: [facility];  Service: General;  Laterality: N/A;  . COLON SURGERY    . COLONOSCOPY    . EUS N/A 07/18/2013   Procedure: UPPER ENDOSCOPIC ULTRASOUND (EUS) RADIAL;  Surgeon: Milus Banister, MD;  Location: [facility];  Service: Endoscopy;  Laterality: N/A;  . EYE SURGERY    . HERNIA REPAIR    . INCISIONAL HERNIA REPAIR N/A 02/20/2014   Procedure: LAP ASSISTED INCISIONAL HERNIA REPAIR LYSIS OF ADHESIONS;  Surgeon: Leighton Ruff, MD;  Location: [facility];  Service: General;  Laterality: N/A;  converted to open @ 0935   . INCISIONAL HERNIA REPAIR N/A 02/20/2014   Procedure: HERNIA REPAIR INCISIONAL;  Surgeon: Leighton Ruff, MD;  Location: [facility];  Service: General;  Laterality: N/A;  With MESH  . INTRAOCULAR LENS INSERTION Bilateral    6 yrs ago  . VENTRAL HERNIA REPAIR      Current Outpatient Medications  Medication Sig Dispense Refill  . ciprofloxacin (CIPRO) 500 MG tablet Take 1 tablet (500 mg total) by mouth 2 (two) times daily for 10 days. 20 tablet 0  .  loperamide (IMODIUM) 2 MG capsule Take 1 capsule (2 mg total) by mouth 4 (four) times daily as needed for diarrhea or loose stools. 12 capsule 0  . metroNIDAZOLE (FLAGYL) 500 MG tablet Take 1 tablet (500 mg total) by mouth 3 (three) times daily for 10 days. 30 tablet 0  . Multiple Vitamin (MULTIVITAMIN WITH MINERALS) TABS tablet Take 1 tablet by mouth daily at 12 noon.    . ondansetron (ZOFRAN) 4 MG tablet Take 1 tablet (4 mg total) by mouth every 8 (eight) hours as needed for nausea or vomiting. 20 tablet 0  . pantoprazole (PROTONIX) 40 MG tablet  Take 1 tablet (40 mg total) by mouth daily. 90 tablet 4  . simethicone (MYLICON) 80 MG chewable tablet Chew 1 tablet (80 mg total) by mouth every 6 (six) hours as needed for flatulence. 30 tablet 0  . traMADol (ULTRAM) 50 MG tablet Take 1 tablet (50 mg total) by mouth every 8 (eight) hours as needed for severe pain. 40 tablet 0  . mesalamine (CANASA) 1000 MG suppository Place 1 suppository (1,000 mg total) rectally at bedtime. 30 suppository 3   Current Facility-Administered Medications  Medication Dose Route Frequency Provider Last Rate Last Dose  . 0.9 %  sodium chloride infusion  500 mL Intravenous Continuous Pyrtle, Lajuan Lines, MD        Allergies as of 08/03/2017 - Review Complete 08/03/2017  Allergen Reaction Noted  . Ace inhibitors Other (See Comments) 08/19/2013  . Advil [ibuprofen] Other (See Comments) 03/05/2013  . Aspirin Nausea And Vomiting 03/05/2013  . Tylenol [acetaminophen] Other (See Comments) 03/05/2013    Family History  Problem Relation Age of Onset  . Cancer Mother        type unknown  . Diabetes Father   . Heart disease Father   . Heart disease Sister   . Heart attack Brother   . Colon cancer Neg Hx   . Esophageal cancer Neg Hx   . Rectal cancer Neg Hx   . Stomach cancer Neg Hx     Social History   Socioeconomic History  . Marital status: Single    Spouse name: Not on file  . Number of children: 0  . Years of education: Not on file  . Highest education level: Not on file  Social Needs  . Financial resource strain: Not on file  . Food insecurity - worry: Not on file  . Food insecurity - inability: Not on file  . Transportation needs - medical: Not on file  . Transportation needs - non-medical: Not on file  Occupational History  . Occupation: retired  Tobacco Use  . Smoking status: Former Smoker    Packs/day: 0.00    Types: Cigarettes    Last attempt to quit: 06/14/2003    Years since quitting: 14.1  . Smokeless tobacco: Never Used  . Tobacco  comment: quit in July 2005  Substance and Sexual Activity  . Alcohol use: No  . Drug use: Yes    Types: Marijuana    Comment: current,06/15/16 STATED 14MONTHS CLEAN  . Sexual activity: Yes  Other Topics Concern  . Not on file  Social History Narrative  . Not on file    Review of Systems:    Constitutional: No weight loss, fever or chills Cardiovascular: No chest pain  Respiratory: No SOB  Gastrointestinal: See HPI and otherwise negative  Physical Exam:  Vital signs: BP 108/60   Pulse 80 Comment: irregular  Ht 5' 9"  (1.753 m)  Wt 208 lb (94.3 kg)   BMI 30.72 kg/m   Constitutional:   Pleasant AA male appears to be in NAD, Well developed, Well nourished, alert and cooperative Respiratory: Respirations even and unlabored. Lungs clear to auscultation bilaterally.   No wheezes, crackles, or rhonchi.  Cardiovascular: Normal S1, S2. No MRG. Regular rate and rhythm. No peripheral edema, cyanosis or pallor.  Gastrointestinal:  Soft, nondistended, nontender. No rebound or guarding. Normal bowel sounds. No appreciable masses or hepatomegaly. Psychiatric:  Demonstrates good judgement and reason without abnormal affect or behaviors.  RELEVANT LABS AND IMAGING: CBC    Component Value Date/Time   WBC 7.8 07/28/2017 1245   RBC 5.07 07/28/2017 1245   HGB 14.4 07/28/2017 1245   HCT 42.8 07/28/2017 1245   PLT 350 07/28/2017 1245   MCV 84.4 07/28/2017 1245   MCH 28.4 07/28/2017 1245   MCHC 33.6 07/28/2017 1245   RDW 14.4 07/28/2017 1245   LYMPHSABS 3.2 07/28/2017 1245   MONOABS 0.6 07/28/2017 1245   EOSABS 0.0 07/28/2017 1245   BASOSABS 0.1 07/28/2017 1245    CMP     Component Value Date/Time   NA 134 (L) 07/28/2017 1245   K 4.0 07/28/2017 1245   CL 101 07/28/2017 1245   CO2 22 07/28/2017 1245   GLUCOSE 95 07/28/2017 1245   BUN 13 07/28/2017 1245   CREATININE 1.07 07/28/2017 1245   CREATININE 0.82 11/27/2014 1558   CALCIUM 8.6 (L) 07/28/2017 1245   PROT 8.0 07/28/2017 1245     ALBUMIN 3.1 (L) 07/28/2017 1245   AST 74 (H) 07/28/2017 1245   ALT 44 07/28/2017 1245   ALKPHOS 82 07/28/2017 1245   BILITOT 0.7 07/28/2017 1245   GFRNONAA >60 07/28/2017 1245   GFRAA >60 07/28/2017 1245   Ct Abdomen Pelvis W Contrast  Result Date: 07/28/2017 CLINICAL DATA:  Abdominal cramping for 4 days. History of ulcerative colitis and colectomy. EXAM: CT ABDOMEN AND PELVIS WITH CONTRAST TECHNIQUE: Multidetector CT imaging of the abdomen and pelvis was performed using the standard protocol following bolus administration of intravenous contrast. CONTRAST:  100 cc Isovue-300 intravenous COMPARISON:  06/28/2013 FINDINGS: Lower chest: Chronic enlargement of periesophageal lymph nodes, 13 mm short axis right of the lower esophagus. No acute finding. Hepatobiliary: Heterogeneous liver. There is a cyst in the caudate lobe that is essentially stable. No evidence of metastatic disease.No evidence of biliary obstruction or stone. Pancreas: Unremarkable. Spleen: Unremarkable. Adrenals/Urinary Tract: Negative adrenals. No hydronephrosis or stone. Unremarkable bladder. Stomach/Bowel: Rectal mucosal hyperenhancement and mild wall thickening. Mesial rectal adenopathy with rounded nodes measuring up to 14 mm at the level of the enterocolonic anastomosis. Given the history, there is presumably rectal surveillance for tumor. No small bowel inflammation or obstruction seen. Vascular/Lymphatic: No acute vascular abnormality. Mild atherosclerotic calcification of the aorta. No mass or adenopathy. Reproductive:Negative. Other: No ascites or pneumoperitoneum. Scarring in place of previously epigastric hernia. Musculoskeletal: No acute abnormalities. Advanced lumbar spondylosis and disc degeneration. There is L5-S1 ankylosis. Diffuse thoracic spondylosis with foraminal narrowing. Right more than left hip osteoarthritis. IMPRESSION: Proctitis with mesorectal adenopathy. History of ulcerative colitis and total colectomy.  Electronically Signed   By: Monte Fantasia M.D.   On: 07/28/2017 15:21   Assessment: 1.  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  GERD: Controlled 3.  History of colon cancer: Status post resection, surveillance flex sig recommended every 24 months,  last January 2018  Plan: 1.  Recommend patient finish his antibiotic regimen as prescribed. 2.  Patient having difficulty with insertion of Rowasa enemas.  Will try Canasa suppositories at bedtime instead. Dr. Hilarie Fredrickson can adjust at follow up. 3.  Patient to follow-up with Dr. Hilarie Fredrickson in 1-2 months for his yearly follow-up.  [name], PA-C [facility] 08/03/2017, 3:59 PM  Cc: Tresa Garter, MD   Addendum: Reviewed and agree with management. Pyrtle, Lajuan Lines, MD

## 2017-08-04 ENCOUNTER — Telehealth: Payer: Self-pay | Admitting: Physician Assistant

## 2017-08-04 NOTE — Telephone Encounter (Signed)
Pharmacy states they will cover Apriso or Balasazide please advise. I will initiate prior authorization in the mean time.   Thanks!

## 2017-08-07 NOTE — Telephone Encounter (Signed)
We were trying to get him suppository or enema-, there are no other options for this form of delivery- we can try the rowasa enemas again, maybe see if the applicator is as uncomfortable this time- maybe this was a product flaw. Thanks [name], [place]

## 2017-08-07 NOTE — Telephone Encounter (Signed)
Left message for patient to let him know that I have initiated the prior auth and will let him know as soon as I hear back from his insurance.

## 2017-08-08 DIAGNOSIS — F1220 Cannabis dependence, uncomplicated: Secondary | ICD-10-CM | POA: Diagnosis not present

## 2017-08-08 DIAGNOSIS — Z5181 Encounter for therapeutic drug level monitoring: Secondary | ICD-10-CM | POA: Diagnosis not present

## 2017-08-08 DIAGNOSIS — F1120 Opioid dependence, uncomplicated: Secondary | ICD-10-CM | POA: Diagnosis not present

## 2017-08-08 NOTE — Telephone Encounter (Signed)
Prior auth approved for Mesalamine 1000 mg suppository from [employer]. Authorization is good until 06-12-2018.

## 2017-08-10 DIAGNOSIS — F1120 Opioid dependence, uncomplicated: Secondary | ICD-10-CM | POA: Diagnosis not present

## 2017-08-10 DIAGNOSIS — F1220 Cannabis dependence, uncomplicated: Secondary | ICD-10-CM | POA: Diagnosis not present

## 2017-08-10 DIAGNOSIS — Z5181 Encounter for therapeutic drug level monitoring: Secondary | ICD-10-CM | POA: Diagnosis not present

## 2017-08-29 DIAGNOSIS — Z5181 Encounter for therapeutic drug level monitoring: Secondary | ICD-10-CM | POA: Diagnosis not present

## 2017-08-29 DIAGNOSIS — F1220 Cannabis dependence, uncomplicated: Secondary | ICD-10-CM | POA: Diagnosis not present

## 2017-08-29 DIAGNOSIS — F1120 Opioid dependence, uncomplicated: Secondary | ICD-10-CM | POA: Diagnosis not present

## 2017-08-31 DIAGNOSIS — F1220 Cannabis dependence, uncomplicated: Secondary | ICD-10-CM | POA: Diagnosis not present

## 2017-08-31 DIAGNOSIS — Z5181 Encounter for therapeutic drug level monitoring: Secondary | ICD-10-CM | POA: Diagnosis not present

## 2017-08-31 DIAGNOSIS — F1120 Opioid dependence, uncomplicated: Secondary | ICD-10-CM | POA: Diagnosis not present

## 2017-09-04 DIAGNOSIS — F1120 Opioid dependence, uncomplicated: Secondary | ICD-10-CM | POA: Diagnosis not present

## 2017-09-04 DIAGNOSIS — F1220 Cannabis dependence, uncomplicated: Secondary | ICD-10-CM | POA: Diagnosis not present

## 2017-09-04 DIAGNOSIS — Z5181 Encounter for therapeutic drug level monitoring: Secondary | ICD-10-CM | POA: Diagnosis not present

## 2017-09-06 DIAGNOSIS — F1120 Opioid dependence, uncomplicated: Secondary | ICD-10-CM | POA: Diagnosis not present

## 2017-09-06 DIAGNOSIS — F1220 Cannabis dependence, uncomplicated: Secondary | ICD-10-CM | POA: Diagnosis not present

## 2017-09-06 DIAGNOSIS — Z5181 Encounter for therapeutic drug level monitoring: Secondary | ICD-10-CM | POA: Diagnosis not present

## 2017-09-12 DIAGNOSIS — Z5181 Encounter for therapeutic drug level monitoring: Secondary | ICD-10-CM | POA: Diagnosis not present

## 2017-09-12 DIAGNOSIS — F1120 Opioid dependence, uncomplicated: Secondary | ICD-10-CM | POA: Diagnosis not present

## 2017-09-12 DIAGNOSIS — F1220 Cannabis dependence, uncomplicated: Secondary | ICD-10-CM | POA: Diagnosis not present

## 2017-09-14 DIAGNOSIS — Z5181 Encounter for therapeutic drug level monitoring: Secondary | ICD-10-CM | POA: Diagnosis not present

## 2017-09-14 DIAGNOSIS — F1120 Opioid dependence, uncomplicated: Secondary | ICD-10-CM | POA: Diagnosis not present

## 2017-09-14 DIAGNOSIS — F1220 Cannabis dependence, uncomplicated: Secondary | ICD-10-CM | POA: Diagnosis not present

## 2017-09-20 DIAGNOSIS — F1220 Cannabis dependence, uncomplicated: Secondary | ICD-10-CM | POA: Diagnosis not present

## 2017-09-20 DIAGNOSIS — F1120 Opioid dependence, uncomplicated: Secondary | ICD-10-CM | POA: Diagnosis not present

## 2017-09-20 DIAGNOSIS — Z5181 Encounter for therapeutic drug level monitoring: Secondary | ICD-10-CM | POA: Diagnosis not present

## 2017-09-22 DIAGNOSIS — F1120 Opioid dependence, uncomplicated: Secondary | ICD-10-CM | POA: Diagnosis not present

## 2017-09-22 DIAGNOSIS — Z5181 Encounter for therapeutic drug level monitoring: Secondary | ICD-10-CM | POA: Diagnosis not present

## 2017-09-22 DIAGNOSIS — F1220 Cannabis dependence, uncomplicated: Secondary | ICD-10-CM | POA: Diagnosis not present

## 2017-09-26 DIAGNOSIS — F1220 Cannabis dependence, uncomplicated: Secondary | ICD-10-CM | POA: Diagnosis not present

## 2017-09-26 DIAGNOSIS — Z5181 Encounter for therapeutic drug level monitoring: Secondary | ICD-10-CM | POA: Diagnosis not present

## 2017-09-26 DIAGNOSIS — F1120 Opioid dependence, uncomplicated: Secondary | ICD-10-CM | POA: Diagnosis not present

## 2017-09-28 DIAGNOSIS — Z5181 Encounter for therapeutic drug level monitoring: Secondary | ICD-10-CM | POA: Diagnosis not present

## 2017-09-28 DIAGNOSIS — F1220 Cannabis dependence, uncomplicated: Secondary | ICD-10-CM | POA: Diagnosis not present

## 2017-09-28 DIAGNOSIS — F1120 Opioid dependence, uncomplicated: Secondary | ICD-10-CM | POA: Diagnosis not present

## 2017-10-02 DIAGNOSIS — F1120 Opioid dependence, uncomplicated: Secondary | ICD-10-CM | POA: Diagnosis not present

## 2017-10-02 DIAGNOSIS — F1220 Cannabis dependence, uncomplicated: Secondary | ICD-10-CM | POA: Diagnosis not present

## 2017-10-02 DIAGNOSIS — Z5181 Encounter for therapeutic drug level monitoring: Secondary | ICD-10-CM | POA: Diagnosis not present

## 2017-10-04 DIAGNOSIS — Z5181 Encounter for therapeutic drug level monitoring: Secondary | ICD-10-CM | POA: Diagnosis not present

## 2017-10-04 DIAGNOSIS — F1220 Cannabis dependence, uncomplicated: Secondary | ICD-10-CM | POA: Diagnosis not present

## 2017-10-04 DIAGNOSIS — F1120 Opioid dependence, uncomplicated: Secondary | ICD-10-CM | POA: Diagnosis not present

## 2017-10-10 DIAGNOSIS — F1220 Cannabis dependence, uncomplicated: Secondary | ICD-10-CM | POA: Diagnosis not present

## 2017-10-10 DIAGNOSIS — Z5181 Encounter for therapeutic drug level monitoring: Secondary | ICD-10-CM | POA: Diagnosis not present

## 2017-10-10 DIAGNOSIS — F1120 Opioid dependence, uncomplicated: Secondary | ICD-10-CM | POA: Diagnosis not present

## 2017-10-12 DIAGNOSIS — Z5181 Encounter for therapeutic drug level monitoring: Secondary | ICD-10-CM | POA: Diagnosis not present

## 2017-10-12 DIAGNOSIS — F1220 Cannabis dependence, uncomplicated: Secondary | ICD-10-CM | POA: Diagnosis not present

## 2017-10-12 DIAGNOSIS — F1120 Opioid dependence, uncomplicated: Secondary | ICD-10-CM | POA: Diagnosis not present

## 2017-10-16 DIAGNOSIS — Z5181 Encounter for therapeutic drug level monitoring: Secondary | ICD-10-CM | POA: Diagnosis not present

## 2017-10-16 DIAGNOSIS — F1120 Opioid dependence, uncomplicated: Secondary | ICD-10-CM | POA: Diagnosis not present

## 2017-10-16 DIAGNOSIS — F1220 Cannabis dependence, uncomplicated: Secondary | ICD-10-CM | POA: Diagnosis not present

## 2017-10-17 ENCOUNTER — Ambulatory Visit (INDEPENDENT_AMBULATORY_CARE_PROVIDER_SITE_OTHER): Payer: Medicare HMO | Admitting: Internal Medicine

## 2017-10-17 ENCOUNTER — Other Ambulatory Visit: Payer: Medicare HMO

## 2017-10-17 ENCOUNTER — Encounter: Payer: Self-pay | Admitting: Internal Medicine

## 2017-10-17 VITALS — BP 120/80 | HR 84 | Ht 69.0 in | Wt 200.0 lb

## 2017-10-17 DIAGNOSIS — Z9049 Acquired absence of other specified parts of digestive tract: Secondary | ICD-10-CM

## 2017-10-17 DIAGNOSIS — R197 Diarrhea, unspecified: Secondary | ICD-10-CM | POA: Diagnosis not present

## 2017-10-17 DIAGNOSIS — K5120 Ulcerative (chronic) proctitis without complications: Secondary | ICD-10-CM | POA: Diagnosis not present

## 2017-10-17 DIAGNOSIS — Z85038 Personal history of other malignant neoplasm of large intestine: Secondary | ICD-10-CM

## 2017-10-17 MED ORDER — MESALAMINE 1000 MG RE SUPP: 1000.0000 mg | Freq: Every day | RECTAL | 1 refills | Status: DC

## 2017-10-17 NOTE — Patient Instructions (Signed)
Your provider has requested that you go to the basement level for lab work before leaving today. Press "B" on the elevator. The lab is located at the first door on the left as you exit the elevator.  We have sent the following medications to your pharmacy for you to pick up at your convenience: canasa 1 gram suppository - 1 nightly  Please follow up with Dr Hilarie Fredrickson in 6 months.  If you are age 74 or older, your body mass index should be between 23-30. Your Body mass index is 29.53 kg/m. If this is out of the aforementioned range listed, please consider follow up with your Primary Care Provider.  If you are age 60 or younger, your body mass index should be between 19-25. Your Body mass index is 29.53 kg/m. If this is out of the aformentioned range listed, please consider follow up with your Primary Care Provider.

## 2017-10-17 NOTE — Progress Notes (Signed)
   Subjective:    Patient ID: Robert Rhodes, male    DOB: 1946-02-21, 72 y.o.   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  He was recently seen on 08/03/2017 by [name], PA-C after being treated for presumed acute infectious colitis.  Around that time he had been seen in the ER with 3 days of abdominal cramping, diarrhea and nausea.  CT showed proctitis with adenopathy.  GI pathogen panel and C. difficile test were negative but he was treated with Cipro and Flagyl.  By the time he saw [name] he had improved.  Today he reports he is feeling well though he has had increased frequency of bowel movement.  He normally has bowel movements 4-5 times per day but most recently they have been 7-8 times per day.  They have been normal appearing without blood or melena.  He has had no further abdominal pain.  Good appetite.  No blood in his stool or melena.  He has tried to recently increase his hydration and is drinking alkaline water and Gatorade on a more regular infrequent basis.  He has been using Canasa suppository 1 g every night without problem.  He denies tenesmus.  He is having issues with bilateral osteoarthritic knee pain.  His last endoscopic surveillance was with flex sig in January 2018   Review of Systems As per HPI, otherwise negative  Current Medications, Allergies, Past Medical History, Past Surgical History, Family History and Social History were reviewed in [employer] record.     Objective:   Physical Exam BP 120/80   Pulse 84   Ht 5' 9"  (1.753 m)   Wt 200 lb (90.7 kg)   BMI 29.53 kg/m  Constitutional: Well-developed and well-nourished. No distress. HEENT: Normocephalic and atraumatic. Oropharynx is clear and moist. Conjunctivae are normal.  No  scleral icterus. Neck: Neck supple. Trachea midline. Cardiovascular: Normal rate, regular rhythm and intact distal pulses. No M/R/G Pulmonary/chest: Effort normal and breath sounds normal. No wheezing, rales or rhonchi. Abdominal: Soft, nontender, nondistended. Bowel sounds active throughout. There are no masses palpable. No hepatosplenomegaly. Extremities: no clubbing, cyanosis, or edema Neurological: Alert and oriented to person place and time. Skin: Skin is warm and dry. Psychiatric: Normal mood and affect. Behavior is normal.      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   1.  Ulcerative colitis with history of colon cancer status post subtotal colectomy with ileorectal anastomosis --his episode of colitis may have been infectious versus inflammatory in February.  This has resolved with antibiotics.  He is having more bowel movements than normal for him and so I am going to check a C. difficile PCR to ensure he does not have C. difficile colitis/enteritis.  If negative, this may be secondary to his recent increase in overall fluid intake given his prior subtotal colectomy.  We will continue Canasa 1 g nightly on an ongoing basis.  Repeat flex sig is recommended in January 2020  15 minutes spent with the patient today. Greater than 50% was spent in counseling and coordination of care with the patient

## 2017-10-18 DIAGNOSIS — F1120 Opioid dependence, uncomplicated: Secondary | ICD-10-CM | POA: Diagnosis not present

## 2017-10-18 DIAGNOSIS — F1220 Cannabis dependence, uncomplicated: Secondary | ICD-10-CM | POA: Diagnosis not present

## 2017-10-18 DIAGNOSIS — Z5181 Encounter for therapeutic drug level monitoring: Secondary | ICD-10-CM | POA: Diagnosis not present

## 2017-10-18 LAB — CLOSTRIDIUM DIFFICILE TOXIN B, QUALITATIVE, REAL-TIME PCR: Toxigenic C. Difficile by PCR: NOT DETECTED

## 2017-10-23 DIAGNOSIS — F1120 Opioid dependence, uncomplicated: Secondary | ICD-10-CM | POA: Diagnosis not present

## 2017-10-23 DIAGNOSIS — Z5181 Encounter for therapeutic drug level monitoring: Secondary | ICD-10-CM | POA: Diagnosis not present

## 2017-10-23 DIAGNOSIS — F1220 Cannabis dependence, uncomplicated: Secondary | ICD-10-CM | POA: Diagnosis not present

## 2017-10-25 DIAGNOSIS — Z5181 Encounter for therapeutic drug level monitoring: Secondary | ICD-10-CM | POA: Diagnosis not present

## 2017-10-25 DIAGNOSIS — F1120 Opioid dependence, uncomplicated: Secondary | ICD-10-CM | POA: Diagnosis not present

## 2017-10-25 DIAGNOSIS — F1220 Cannabis dependence, uncomplicated: Secondary | ICD-10-CM | POA: Diagnosis not present

## 2017-10-31 DIAGNOSIS — F1120 Opioid dependence, uncomplicated: Secondary | ICD-10-CM | POA: Diagnosis not present

## 2017-10-31 DIAGNOSIS — Z5181 Encounter for therapeutic drug level monitoring: Secondary | ICD-10-CM | POA: Diagnosis not present

## 2017-10-31 DIAGNOSIS — F1220 Cannabis dependence, uncomplicated: Secondary | ICD-10-CM | POA: Diagnosis not present

## 2017-11-02 DIAGNOSIS — Z5181 Encounter for therapeutic drug level monitoring: Secondary | ICD-10-CM | POA: Diagnosis not present

## 2017-11-02 DIAGNOSIS — F1120 Opioid dependence, uncomplicated: Secondary | ICD-10-CM | POA: Diagnosis not present

## 2017-11-02 DIAGNOSIS — F1220 Cannabis dependence, uncomplicated: Secondary | ICD-10-CM | POA: Diagnosis not present

## 2017-11-08 ENCOUNTER — Encounter: Payer: Self-pay | Admitting: Family Medicine

## 2017-11-08 ENCOUNTER — Ambulatory Visit: Payer: Medicare HMO | Attending: Family Medicine | Admitting: Family Medicine

## 2017-11-08 VITALS — BP 150/79 | HR 86 | Temp 98.3°F | Ht 69.0 in | Wt 206.0 lb

## 2017-11-08 DIAGNOSIS — Z79891 Long term (current) use of opiate analgesic: Secondary | ICD-10-CM | POA: Insufficient documentation

## 2017-11-08 DIAGNOSIS — Z961 Presence of intraocular lens: Secondary | ICD-10-CM | POA: Diagnosis not present

## 2017-11-08 DIAGNOSIS — Z85038 Personal history of other malignant neoplasm of large intestine: Secondary | ICD-10-CM | POA: Diagnosis not present

## 2017-11-08 DIAGNOSIS — K5190 Ulcerative colitis, unspecified, without complications: Secondary | ICD-10-CM | POA: Insufficient documentation

## 2017-11-08 DIAGNOSIS — Z972 Presence of dental prosthetic device (complete) (partial): Secondary | ICD-10-CM

## 2017-11-08 DIAGNOSIS — K219 Gastro-esophageal reflux disease without esophagitis: Secondary | ICD-10-CM | POA: Insufficient documentation

## 2017-11-08 DIAGNOSIS — Z886 Allergy status to analgesic agent status: Secondary | ICD-10-CM | POA: Insufficient documentation

## 2017-11-08 DIAGNOSIS — Z9889 Other specified postprocedural states: Secondary | ICD-10-CM | POA: Insufficient documentation

## 2017-11-08 DIAGNOSIS — Z79899 Other long term (current) drug therapy: Secondary | ICD-10-CM | POA: Insufficient documentation

## 2017-11-08 DIAGNOSIS — Z888 Allergy status to other drugs, medicaments and biological substances status: Secondary | ICD-10-CM | POA: Diagnosis not present

## 2017-11-08 DIAGNOSIS — F1120 Opioid dependence, uncomplicated: Secondary | ICD-10-CM | POA: Diagnosis not present

## 2017-11-08 DIAGNOSIS — F1220 Cannabis dependence, uncomplicated: Secondary | ICD-10-CM | POA: Diagnosis not present

## 2017-11-08 DIAGNOSIS — M170 Bilateral primary osteoarthritis of knee: Secondary | ICD-10-CM | POA: Insufficient documentation

## 2017-11-08 DIAGNOSIS — E559 Vitamin D deficiency, unspecified: Secondary | ICD-10-CM | POA: Insufficient documentation

## 2017-11-08 DIAGNOSIS — K08409 Partial loss of teeth, unspecified cause, unspecified class: Secondary | ICD-10-CM

## 2017-11-08 DIAGNOSIS — Z5181 Encounter for therapeutic drug level monitoring: Secondary | ICD-10-CM | POA: Diagnosis not present

## 2017-11-08 DIAGNOSIS — I10 Essential (primary) hypertension: Secondary | ICD-10-CM | POA: Insufficient documentation

## 2017-11-08 MED ORDER — NAPROXEN 500 MG PO TABS: 500.0000 mg | ORAL_TABLET | Freq: Two times a day (BID) | ORAL | 1 refills | Status: DC

## 2017-11-08 NOTE — Progress Notes (Signed)
Subjective:  Patient ID: Robert Rhodes, male    DOB: [birth year]  Age: 72 y.o. MRN: [id number]  CC: Hypertension   HPI Robert Rhodes is a 72 year old male with a history of hypertension (diet-controlled), ulcerative colitis (on mesalamine, currently followed by GI-Dr. Zenovia Jarred), bilateral knee osteoarthritis who presents today to establish care with me. He complains of bilateral knee pain which is worse with ambulation and is status post cortisone shots by Dr Erlinda Hong in the past and would like to be referred for shots again.  He previously took naproxen which he has run out of. His ulcerative colitis is stable although with absence of diarrhea but he endorses soft bowel movements occurring about 6-7 times a day and he has been compliant with his medication and follow-up to GI. He complains of seeing floaters in his eyes and requests ophthalmology referral.  He was previously seen by [facility] in 2017 at which time he had posterior vitreous detachment and chorioretinitis.  He currently does not have an ophthalmologist. His blood pressure is elevated today and he has not been on medication; review of his last visit indicates his blood pressure was normal then.  Past Medical History:  Diagnosis Date  . Adenomatous colon polyp   . Arthritis   . Claustrophobia   . Colon cancer ([place])   . Eye abnormalities    right eye seen in ED 02/17/2014 wearing eye patch using eye drops  . GERD (gastroesophageal reflux disease)   . Osteoarthritis    bilat knees  needs replacement  . Osteoporosis    pt denies  . Substance abuse ([place])   . UC (ulcerative colitis) ([place])   . Vitamin D deficiency     Past Surgical History:  Procedure Laterality Date  . COLON RESECTION N/A 08/23/2013   Procedure: Laparoscopic total abdominal colectomy and hernia repair;  Surgeon: Leighton Ruff, MD;  Location: [facility];  Service: General;  Laterality: N/A;  . COLON SURGERY    . COLONOSCOPY    . EUS N/A  07/18/2013   Procedure: UPPER ENDOSCOPIC ULTRASOUND (EUS) RADIAL;  Surgeon: Milus Banister, MD;  Location: [facility];  Service: Endoscopy;  Laterality: N/A;  . EYE SURGERY    . HERNIA REPAIR    . INCISIONAL HERNIA REPAIR N/A 02/20/2014   Procedure: LAP ASSISTED INCISIONAL HERNIA REPAIR LYSIS OF ADHESIONS;  Surgeon: Leighton Ruff, MD;  Location: [facility];  Service: General;  Laterality: N/A;  converted to open @ 0935   . INCISIONAL HERNIA REPAIR N/A 02/20/2014   Procedure: HERNIA REPAIR INCISIONAL;  Surgeon: Leighton Ruff, MD;  Location: [facility];  Service: General;  Laterality: N/A;  With MESH  . INTRAOCULAR LENS INSERTION Bilateral    6 yrs ago  . VENTRAL HERNIA REPAIR      Allergies  Allergen Reactions  . Ace Inhibitors Other (See Comments)    Cant remember  . Advil [Ibuprofen] Other (See Comments)    Muscle tightness  . Aspirin Nausea And Vomiting  . Tylenol [Acetaminophen] Other (See Comments)    Muscle tightness     Outpatient Medications Prior to Visit  Medication Sig Dispense Refill  . mesalamine (CANASA) 1000 MG suppository Place 1 suppository (1,000 mg total) rectally at bedtime. 90 suppository 1  . Multiple Vitamin (MULTIVITAMIN WITH MINERALS) TABS tablet Take 1 tablet by mouth daily at 12 noon.    . Omega 3 1200 MG CAPS Take 1 capsule by mouth daily.    . traMADol Veatrice Bourbon)  50 MG tablet Take 1 tablet (50 mg total) by mouth every 8 (eight) hours as needed for severe pain. 40 tablet 0   Facility-Administered Medications Prior to Visit  Medication Dose Route Frequency Provider Last Rate Last Dose  . 0.9 %  sodium chloride infusion  500 mL Intravenous Continuous Pyrtle, Lajuan Lines, MD        ROS Review of Systems  Constitutional: Negative for activity change and appetite change.  HENT: Negative for sinus pressure and sore throat.   Eyes: Positive for visual disturbance.  Respiratory: Negative for cough, chest tightness and shortness of breath.   Cardiovascular: Negative for  chest pain and leg swelling.  Gastrointestinal: Negative for abdominal distention, abdominal pain, constipation and diarrhea.  Endocrine: Negative.   Genitourinary: Negative for dysuria.  Musculoskeletal:       See hpi  Skin: Negative for rash.  Allergic/Immunologic: Negative.   Neurological: Negative for weakness, light-headedness and numbness.  Psychiatric/Behavioral: Negative for dysphoric mood and suicidal ideas.    Objective:  BP (!) 150/79   Pulse 86   Temp 98.3 F (36.8 C) (Oral)   Ht 5' 9"  (1.753 m)   Wt 206 lb (93.4 kg)   SpO2 100%   BMI 30.42 kg/m   BP/Weight 11/08/2017 10/17/2017 2/77/4128  Systolic BP 786 767 209  Diastolic BP 79 80 60  Wt. (Lbs) 206 200 208  BMI 30.42 29.53 30.72      Physical Exam  Constitutional: He is oriented to person, place, and time. He appears well-developed and well-nourished.  Cardiovascular: Normal rate, normal heart sounds and intact distal pulses.  No murmur heard. Pulmonary/Chest: Effort normal and breath sounds normal. He has no wheezes. He has no rales. He exhibits no tenderness.  Abdominal: Soft. Bowel sounds are normal. He exhibits no distension and no mass. There is no tenderness.  Musculoskeletal: He exhibits edema (slight b/l knee edema) and tenderness (TTP of medial and lateral joint lines, associated crepitus on ROM bilaterally).  Neurological: He is alert and oriented to person, place, and time.  Skin: Skin is warm and dry.  Psychiatric: He has a normal mood and affect.     Assessment & Plan:   1. Essential hypertension, benign Elevated blood pressure His blood pressure was normal at his last office visit and he has been on dietary control Lifestyle modification plan I will reassess at next visit and determine if an antihypertensive needs to be reinitiated  2. Ulcerative colitis without complications, unspecified location ([place]) Stable Continue mesalamine Keep appointment with GI, Dr. Hilarie Fredrickson  3. Bilateral  primary osteoarthritis of knee Uncontrolled He has been out of his NSAIDs which I have refilled - naproxen (NAPROSYN) 500 MG tablet; Take 1 tablet (500 mg total) by mouth 2 (two) times daily with a meal.  Dispense: 60 tablet; Refill: 1 - Ambulatory referral to Orthopedic Surgery  4. Wears partial dentures - Ambulatory referral to Dentistry  5. Pseudophakia of both eyes History of pseudophakia previously seen by [facility] ophthalmology and now with visual concerns - Ambulatory referral to Ophthalmology    Meds ordered this encounter  Medications  . naproxen (NAPROSYN) 500 MG tablet    Sig: Take 1 tablet (500 mg total) by mouth 2 (two) times daily with a meal.    Dispense:  60 tablet    Refill:  1    Follow-up: Return in about 3 months (around 02/08/2018) for Follow-up of elevated blood pressure.   [name] MD

## 2017-11-08 NOTE — Patient Instructions (Signed)

## 2017-11-10 DIAGNOSIS — F1220 Cannabis dependence, uncomplicated: Secondary | ICD-10-CM | POA: Diagnosis not present

## 2017-11-10 DIAGNOSIS — Z5181 Encounter for therapeutic drug level monitoring: Secondary | ICD-10-CM | POA: Diagnosis not present

## 2017-11-10 DIAGNOSIS — F1120 Opioid dependence, uncomplicated: Secondary | ICD-10-CM | POA: Diagnosis not present

## 2017-11-13 DIAGNOSIS — F1120 Opioid dependence, uncomplicated: Secondary | ICD-10-CM | POA: Diagnosis not present

## 2017-11-13 DIAGNOSIS — F1220 Cannabis dependence, uncomplicated: Secondary | ICD-10-CM | POA: Diagnosis not present

## 2017-11-13 DIAGNOSIS — Z5181 Encounter for therapeutic drug level monitoring: Secondary | ICD-10-CM | POA: Diagnosis not present

## 2017-11-15 DIAGNOSIS — Z5181 Encounter for therapeutic drug level monitoring: Secondary | ICD-10-CM | POA: Diagnosis not present

## 2017-11-15 DIAGNOSIS — F1220 Cannabis dependence, uncomplicated: Secondary | ICD-10-CM | POA: Diagnosis not present

## 2017-11-15 DIAGNOSIS — F1120 Opioid dependence, uncomplicated: Secondary | ICD-10-CM | POA: Diagnosis not present

## 2017-11-21 ENCOUNTER — Encounter (INDEPENDENT_AMBULATORY_CARE_PROVIDER_SITE_OTHER): Payer: Self-pay | Admitting: Orthopaedic Surgery

## 2017-11-21 ENCOUNTER — Ambulatory Visit (INDEPENDENT_AMBULATORY_CARE_PROVIDER_SITE_OTHER): Payer: Medicare HMO | Admitting: Orthopaedic Surgery

## 2017-11-21 ENCOUNTER — Ambulatory Visit (INDEPENDENT_AMBULATORY_CARE_PROVIDER_SITE_OTHER): Payer: Medicare HMO

## 2017-11-21 ENCOUNTER — Telehealth (INDEPENDENT_AMBULATORY_CARE_PROVIDER_SITE_OTHER): Payer: Self-pay

## 2017-11-21 DIAGNOSIS — F1220 Cannabis dependence, uncomplicated: Secondary | ICD-10-CM | POA: Diagnosis not present

## 2017-11-21 DIAGNOSIS — M1712 Unilateral primary osteoarthritis, left knee: Secondary | ICD-10-CM

## 2017-11-21 DIAGNOSIS — F1120 Opioid dependence, uncomplicated: Secondary | ICD-10-CM | POA: Diagnosis not present

## 2017-11-21 DIAGNOSIS — Z5181 Encounter for therapeutic drug level monitoring: Secondary | ICD-10-CM | POA: Diagnosis not present

## 2017-11-21 DIAGNOSIS — M1711 Unilateral primary osteoarthritis, right knee: Secondary | ICD-10-CM | POA: Diagnosis not present

## 2017-11-21 NOTE — Progress Notes (Signed)
Office Visit Note   Patient: Robert Rhodes           Date of Birth: July 04, 1945           MRN: [id number] Visit Date: 11/21/2017              Requested by: [name], MD [place], [place] [address] PCP: [name], MD   Assessment & Plan: Visit Diagnoses:  1. Unilateral primary osteoarthritis, left knee   2. Unilateral primary osteoarthritis, right knee     Plan: Patient has advanced tricompartmental degenerative joint disease.  We spoke at length today about continued nonsurgical treatment versus total knee replacement and the associated risks and benefits.  For now patient would like to continue to think about this.  We will submit request for another round of Monovisc injections.  We will bring him back for those injections when his been approved. Total face to face encounter time was greater than 25 minutes and over half of this time was spent in counseling and/or coordination of care.  Follow-Up Instructions: Return if symptoms worsen or fail to improve.   Orders:  Orders Placed This Encounter  Procedures  . XR KNEE 3 VIEW LEFT  . XR KNEE 3 VIEW RIGHT   No orders of the defined types were placed in this encounter.     Procedures: No procedures performed   Clinical Data: No additional findings.   Subjective: Chief Complaint  Patient presents with  . Left Knee - Pain  . Right Knee - Pain     Patient is here for follow-up of bilateral knee degenerative joint disease.  He is walking with a cane.  He is requesting another HA injection.   Review of Systems  Constitutional: Negative.   All other systems reviewed and are negative.    Objective: Vital Signs: There were no vitals taken for this visit.  Physical Exam  Constitutional: He is oriented to person, place, and time. He appears well-developed and well-nourished.  HENT:  Head: Normocephalic and atraumatic.  Eyes: Pupils are equal, round, and reactive to light.  Neck:  Neck supple.  Pulmonary/Chest: Effort normal.  Abdominal: Soft.  Musculoskeletal: Normal range of motion.  Neurological: He is alert and oriented to person, place, and time.  Skin: Skin is warm.  Psychiatric: He has a normal mood and affect. His behavior is normal. Judgment and thought content normal.  Nursing note and vitals reviewed.   Ortho Exam Bilateral knee exam is stable. Specialty Comments:  No specialty comments available.  Imaging: Xr Knee 3 View Left  Result Date: 11/21/2017 Advanced degenerative joint disease  Xr Knee 3 View Right  Result Date: 11/21/2017 Advanced degenerative joint disease    PMFS History: Patient Active Problem List   Diagnosis Date Noted  . Osteoporosis   . Ulcerative colitis, with rectal bleeding 08/06/2015  . Gastroesophageal reflux disease without esophagitis 08/06/2015  . Visual impairment of right eye 06/18/2015  . Essential hypertension, benign 06/18/2015  . Hernia of abdominal cavity 11/27/2014  . Bipolar 1 disorder, mixed, moderate ([place]) 11/27/2014  . Protein-calorie malnutrition, severe ([place]) 08/24/2014  . Cellulitis 08/23/2014  . Medically noncompliant 08/23/2014  . Left leg cellulitis 08/23/2014  . Cellulitis of left leg   . Hypokalemia   . Diarrhea 05/06/2014  . Ulcerative colitis ([place]) 04/17/2014  . Verrucous keratosis 04/17/2014  . Nonspecific (abnormal) findings on radiological and other examination of gastrointestinal tract 07/18/2013  . Acute esophagitis 07/18/2013  . Colon  cancer ([place]) 07/16/2013  . Hypovitaminosis D 05/06/2013  . Ulcerative colitis, unspecified 04/04/2013  . Knee pain, bilateral 03/05/2013   Past Medical History:  Diagnosis Date  . Adenomatous colon polyp   . Arthritis   . Claustrophobia   . Colon cancer ([place])   . Eye abnormalities    right eye seen in ED 02/17/2014 wearing eye patch using eye drops  . GERD (gastroesophageal reflux disease)   . Osteoarthritis    bilat knees  needs  replacement  . Osteoporosis    pt denies  . Substance abuse ([place])   . UC (ulcerative colitis) ([place])   . Vitamin D deficiency     Family History  Problem Relation Age of Onset  . Cancer Mother        type unknown  . Diabetes Father   . Heart disease Father   . Heart disease Sister   . Heart attack Brother   . Colon cancer Neg Hx   . Esophageal cancer Neg Hx   . Rectal cancer Neg Hx   . Stomach cancer Neg Hx     Past Surgical History:  Procedure Laterality Date  . COLON RESECTION N/A 08/23/2013   Procedure: Laparoscopic total abdominal colectomy and hernia repair;  Surgeon: Leighton Ruff, MD;  Location: [facility];  Service: General;  Laterality: N/A;  . COLON SURGERY    . COLONOSCOPY    . EUS N/A 07/18/2013   Procedure: UPPER ENDOSCOPIC ULTRASOUND (EUS) RADIAL;  Surgeon: Milus Banister, MD;  Location: [facility];  Service: Endoscopy;  Laterality: N/A;  . EYE SURGERY    . HERNIA REPAIR    . INCISIONAL HERNIA REPAIR N/A 02/20/2014   Procedure: LAP ASSISTED INCISIONAL HERNIA REPAIR LYSIS OF ADHESIONS;  Surgeon: Leighton Ruff, MD;  Location: [facility];  Service: General;  Laterality: N/A;  converted to open @ 0935   . INCISIONAL HERNIA REPAIR N/A 02/20/2014   Procedure: HERNIA REPAIR INCISIONAL;  Surgeon: Leighton Ruff, MD;  Location: [facility];  Service: General;  Laterality: N/A;  With MESH  . INTRAOCULAR LENS INSERTION Bilateral    6 yrs ago  . VENTRAL HERNIA REPAIR     Social History   Occupational History  . Occupation: retired  Tobacco Use  . Smoking status: Former Smoker    Packs/day: 0.00    Types: Cigarettes    Last attempt to quit: 06/14/2003    Years since quitting: 14.4  . Smokeless tobacco: Never Used  . Tobacco comment: quit in July 2005  Substance and Sexual Activity  . Alcohol use: No  . Drug use: Yes    Types: Marijuana    Comment: current,06/15/16 STATED 14MONTHS CLEAN  . Sexual activity: Yes

## 2017-11-21 NOTE — Telephone Encounter (Signed)
Submitted application online for Monovisc injection, bilateral knee.

## 2017-11-23 DIAGNOSIS — F1120 Opioid dependence, uncomplicated: Secondary | ICD-10-CM | POA: Diagnosis not present

## 2017-11-23 DIAGNOSIS — F1220 Cannabis dependence, uncomplicated: Secondary | ICD-10-CM | POA: Diagnosis not present

## 2017-11-23 DIAGNOSIS — Z5181 Encounter for therapeutic drug level monitoring: Secondary | ICD-10-CM | POA: Diagnosis not present

## 2017-11-27 DIAGNOSIS — F1120 Opioid dependence, uncomplicated: Secondary | ICD-10-CM | POA: Diagnosis not present

## 2017-11-27 DIAGNOSIS — F1220 Cannabis dependence, uncomplicated: Secondary | ICD-10-CM | POA: Diagnosis not present

## 2017-11-27 DIAGNOSIS — Z5181 Encounter for therapeutic drug level monitoring: Secondary | ICD-10-CM | POA: Diagnosis not present

## 2017-11-28 ENCOUNTER — Telehealth: Payer: Self-pay | Admitting: Family Medicine

## 2017-11-28 NOTE — Telephone Encounter (Signed)
Will route to PCP 

## 2017-11-28 NOTE — Telephone Encounter (Signed)
Patient called requesting a letter form his PCP stating that because he cannot chew his food due to not having his bottom teeth he needs to see a dentist. Patient states that [facility] would only cover dentist coverage if his PCP would write a letter. Please f/u.

## 2017-11-29 DIAGNOSIS — Z5181 Encounter for therapeutic drug level monitoring: Secondary | ICD-10-CM | POA: Diagnosis not present

## 2017-11-29 DIAGNOSIS — F1120 Opioid dependence, uncomplicated: Secondary | ICD-10-CM | POA: Diagnosis not present

## 2017-11-29 DIAGNOSIS — F1220 Cannabis dependence, uncomplicated: Secondary | ICD-10-CM | POA: Diagnosis not present

## 2017-11-30 ENCOUNTER — Other Ambulatory Visit: Payer: Self-pay | Admitting: Family Medicine

## 2017-11-30 NOTE — Telephone Encounter (Signed)
Ready for pick up

## 2017-11-30 NOTE — Telephone Encounter (Signed)
Patient was called and a voicemail was left informing patient to contact office. If patient returns phone call please inform patient that letter is ready for pick up.

## 2017-12-04 DIAGNOSIS — F1220 Cannabis dependence, uncomplicated: Secondary | ICD-10-CM | POA: Diagnosis not present

## 2017-12-04 DIAGNOSIS — F1120 Opioid dependence, uncomplicated: Secondary | ICD-10-CM | POA: Diagnosis not present

## 2017-12-04 DIAGNOSIS — Z5181 Encounter for therapeutic drug level monitoring: Secondary | ICD-10-CM | POA: Diagnosis not present

## 2017-12-06 ENCOUNTER — Encounter (INDEPENDENT_AMBULATORY_CARE_PROVIDER_SITE_OTHER): Payer: Self-pay | Admitting: Radiology

## 2017-12-06 DIAGNOSIS — F1220 Cannabis dependence, uncomplicated: Secondary | ICD-10-CM | POA: Diagnosis not present

## 2017-12-06 DIAGNOSIS — Z5181 Encounter for therapeutic drug level monitoring: Secondary | ICD-10-CM | POA: Diagnosis not present

## 2017-12-06 DIAGNOSIS — F1120 Opioid dependence, uncomplicated: Secondary | ICD-10-CM | POA: Diagnosis not present

## 2017-12-06 NOTE — Progress Notes (Unsigned)
Will you please call patient and tell him that his insurance will cover the Monovisc injections for both knees?  (buy and bill)  And please schedule him for an appt with Dr Erlinda Hong for these injections?  Thanks-

## 2017-12-07 NOTE — Progress Notes (Signed)
Noted  

## 2017-12-07 NOTE — Progress Notes (Signed)
Left message on voicemail for patient to call back and schedule.

## 2017-12-11 DIAGNOSIS — F1120 Opioid dependence, uncomplicated: Secondary | ICD-10-CM | POA: Diagnosis not present

## 2017-12-11 DIAGNOSIS — Z5181 Encounter for therapeutic drug level monitoring: Secondary | ICD-10-CM | POA: Diagnosis not present

## 2017-12-11 DIAGNOSIS — F1220 Cannabis dependence, uncomplicated: Secondary | ICD-10-CM | POA: Diagnosis not present

## 2017-12-12 ENCOUNTER — Telehealth (INDEPENDENT_AMBULATORY_CARE_PROVIDER_SITE_OTHER): Payer: Self-pay | Admitting: Orthopaedic Surgery

## 2017-12-12 NOTE — Telephone Encounter (Signed)
Called and left a VM for patient to return my call to schedule for Monovisc Inj., bilateral knee.

## 2017-12-12 NOTE — Telephone Encounter (Signed)
Noted. Thank You.

## 2017-12-12 NOTE — Telephone Encounter (Signed)
Scheduled 7/9 with Dr. Erlinda Hong

## 2017-12-12 NOTE — Telephone Encounter (Signed)
Patient called stating revd message in regards to gel inj Please call patient @ [phone]

## 2017-12-13 DIAGNOSIS — F1120 Opioid dependence, uncomplicated: Secondary | ICD-10-CM | POA: Diagnosis not present

## 2017-12-13 DIAGNOSIS — Z5181 Encounter for therapeutic drug level monitoring: Secondary | ICD-10-CM | POA: Diagnosis not present

## 2017-12-13 DIAGNOSIS — F1220 Cannabis dependence, uncomplicated: Secondary | ICD-10-CM | POA: Diagnosis not present

## 2017-12-18 DIAGNOSIS — Z5181 Encounter for therapeutic drug level monitoring: Secondary | ICD-10-CM | POA: Diagnosis not present

## 2017-12-18 DIAGNOSIS — F1120 Opioid dependence, uncomplicated: Secondary | ICD-10-CM | POA: Diagnosis not present

## 2017-12-18 DIAGNOSIS — F1220 Cannabis dependence, uncomplicated: Secondary | ICD-10-CM | POA: Diagnosis not present

## 2017-12-19 ENCOUNTER — Encounter (INDEPENDENT_AMBULATORY_CARE_PROVIDER_SITE_OTHER): Payer: Self-pay | Admitting: Orthopaedic Surgery

## 2017-12-19 ENCOUNTER — Ambulatory Visit (INDEPENDENT_AMBULATORY_CARE_PROVIDER_SITE_OTHER): Payer: Medicare HMO | Admitting: Orthopaedic Surgery

## 2017-12-19 DIAGNOSIS — M1711 Unilateral primary osteoarthritis, right knee: Secondary | ICD-10-CM

## 2017-12-19 DIAGNOSIS — M1712 Unilateral primary osteoarthritis, left knee: Secondary | ICD-10-CM

## 2017-12-19 MED ORDER — HYALURONAN 88 MG/4ML IX SOSY
88.0000 mg | PREFILLED_SYRINGE | INTRA_ARTICULAR | Status: AC | PRN
  Administered 2017-12-19: 88 mg via INTRA_ARTICULAR

## 2017-12-19 NOTE — Progress Notes (Signed)
   Procedure Note  Patient: Robert Rhodes             Date of Birth: [birth year]           MRN: [id number]             Visit Date: 12/19/2017  Procedures: Visit Diagnoses: Unilateral primary osteoarthritis, left knee  Unilateral primary osteoarthritis, right knee  Large Joint Inj: bilateral knee on 12/19/2017 2:47 PM Indications: pain Details: 22 G needle  Arthrogram: No  Medications (Right): 88 mg Hyaluronan 88 MG/4ML Medications (Left): 88 mg Hyaluronan 88 MG/4ML Outcome: tolerated well, no immediate complications Patient was prepped and draped in the usual sterile fashion.

## 2017-12-20 ENCOUNTER — Telehealth (INDEPENDENT_AMBULATORY_CARE_PROVIDER_SITE_OTHER): Payer: Self-pay | Admitting: Orthopaedic Surgery

## 2017-12-20 DIAGNOSIS — Z5181 Encounter for therapeutic drug level monitoring: Secondary | ICD-10-CM | POA: Diagnosis not present

## 2017-12-20 DIAGNOSIS — F1120 Opioid dependence, uncomplicated: Secondary | ICD-10-CM | POA: Diagnosis not present

## 2017-12-20 DIAGNOSIS — F1220 Cannabis dependence, uncomplicated: Secondary | ICD-10-CM | POA: Diagnosis not present

## 2017-12-20 NOTE — Telephone Encounter (Signed)
Returned call to patient left message to return call

## 2017-12-25 DIAGNOSIS — H43813 Vitreous degeneration, bilateral: Secondary | ICD-10-CM | POA: Diagnosis not present

## 2017-12-25 DIAGNOSIS — H30001 Unspecified focal chorioretinal inflammation, right eye: Secondary | ICD-10-CM | POA: Diagnosis not present

## 2017-12-25 DIAGNOSIS — H524 Presbyopia: Secondary | ICD-10-CM | POA: Diagnosis not present

## 2017-12-25 DIAGNOSIS — H52203 Unspecified astigmatism, bilateral: Secondary | ICD-10-CM | POA: Diagnosis not present

## 2018-02-07 ENCOUNTER — Ambulatory Visit: Payer: Medicare HMO | Attending: Family Medicine | Admitting: Family Medicine

## 2018-02-07 ENCOUNTER — Encounter: Payer: Self-pay | Admitting: Family Medicine

## 2018-02-07 VITALS — BP 168/81 | HR 70 | Temp 98.4°F | Ht 69.0 in | Wt 215.8 lb

## 2018-02-07 DIAGNOSIS — E559 Vitamin D deficiency, unspecified: Secondary | ICD-10-CM | POA: Insufficient documentation

## 2018-02-07 DIAGNOSIS — K5190 Ulcerative colitis, unspecified, without complications: Secondary | ICD-10-CM | POA: Diagnosis not present

## 2018-02-07 DIAGNOSIS — I10 Essential (primary) hypertension: Secondary | ICD-10-CM | POA: Diagnosis not present

## 2018-02-07 DIAGNOSIS — Z85038 Personal history of other malignant neoplasm of large intestine: Secondary | ICD-10-CM | POA: Insufficient documentation

## 2018-02-07 DIAGNOSIS — M810 Age-related osteoporosis without current pathological fracture: Secondary | ICD-10-CM | POA: Diagnosis not present

## 2018-02-07 DIAGNOSIS — K219 Gastro-esophageal reflux disease without esophagitis: Secondary | ICD-10-CM | POA: Insufficient documentation

## 2018-02-07 DIAGNOSIS — M170 Bilateral primary osteoarthritis of knee: Secondary | ICD-10-CM | POA: Diagnosis not present

## 2018-02-07 MED ORDER — AMLODIPINE BESYLATE 5 MG PO TABS: 5.0000 mg | ORAL_TABLET | Freq: Every day | ORAL | 3 refills | Status: DC

## 2018-02-07 MED ORDER — NAPROXEN 500 MG PO TABS: 500.0000 mg | ORAL_TABLET | Freq: Two times a day (BID) | ORAL | 1 refills | Status: DC

## 2018-02-07 NOTE — Progress Notes (Signed)
Subjective:  Patient ID: Robert Rhodes, male    DOB: 26-Aug-1945  Age: 72 y.o. MRN: [id number]  CC: Hypertension   HPI Robert Rhodes presents  is a 72 year old male with a history of hypertension (diet-controlled), ulcerative colitis (on mesalamine, currently followed by GI-Dr. Zenovia Jarred), bilateral knee osteoarthritis who is here today for follow-up visit. He is status post corticosteroid injections in both knees by his orthopedics-Dr. Erlinda Hong in 12/2017 and reports significant improvement in pain which is rated as a 4/10 at this time. His blood pressure however is elevated but he informs me it has been good at home and on further questioning his blood pressures have been in the 150/88 range. His ulcerative colitis has been stable and he denies diarrhea, abdominal cramps and is doing well on mesalamine suppositories. He has no additional concerns at this time.  Past Medical History:  Diagnosis Date  . Adenomatous colon polyp   . Arthritis   . Claustrophobia   . Colon cancer ([place])   . Eye abnormalities    right eye seen in ED 02/17/2014 wearing eye patch using eye drops  . GERD (gastroesophageal reflux disease)   . Osteoarthritis    bilat knees  needs replacement  . Osteoporosis    pt denies  . Substance abuse ([place])   . UC (ulcerative colitis) ([place])   . Vitamin D deficiency     Past Surgical History:  Procedure Laterality Date  . COLON RESECTION N/A 08/23/2013   Procedure: Laparoscopic total abdominal colectomy and hernia repair;  Surgeon: Leighton Ruff, MD;  Location: [facility];  Service: General;  Laterality: N/A;  . COLON SURGERY    . COLONOSCOPY    . EUS N/A 07/18/2013   Procedure: UPPER ENDOSCOPIC ULTRASOUND (EUS) RADIAL;  Surgeon: Milus Banister, MD;  Location: [facility];  Service: Endoscopy;  Laterality: N/A;  . EYE SURGERY    . HERNIA REPAIR    . INCISIONAL HERNIA REPAIR N/A 02/20/2014   Procedure: LAP ASSISTED INCISIONAL HERNIA REPAIR LYSIS OF ADHESIONS;  Surgeon:  Leighton Ruff, MD;  Location: [facility];  Service: General;  Laterality: N/A;  converted to open @ 0935   . INCISIONAL HERNIA REPAIR N/A 02/20/2014   Procedure: HERNIA REPAIR INCISIONAL;  Surgeon: Leighton Ruff, MD;  Location: [facility];  Service: General;  Laterality: N/A;  With MESH  . INTRAOCULAR LENS INSERTION Bilateral    6 yrs ago  . VENTRAL HERNIA REPAIR      Allergies  Allergen Reactions  . Ace Inhibitors Other (See Comments)    Cant remember  . Advil [Ibuprofen] Other (See Comments)    Muscle tightness  . Aspirin Nausea And Vomiting  . Tylenol [Acetaminophen] Other (See Comments)    Muscle tightness     Outpatient Medications Prior to Visit  Medication Sig Dispense Refill  . mesalamine (CANASA) 1000 MG suppository Place 1 suppository (1,000 mg total) rectally at bedtime. 90 suppository 1  . Multiple Vitamin (MULTIVITAMIN WITH MINERALS) TABS tablet Take 1 tablet by mouth daily at 12 noon.    . Omega 3 1200 MG CAPS Take 1 capsule by mouth daily.    . traMADol (ULTRAM) 50 MG tablet Take 1 tablet (50 mg total) by mouth every 8 (eight) hours as needed for severe pain. (Patient not taking: Reported on 11/21/2017) 40 tablet 0  . naproxen (NAPROSYN) 500 MG tablet Take 1 tablet (500 mg total) by mouth 2 (two) times daily with a meal. (Patient not taking: Reported on 11/21/2017)  60 tablet 1   Facility-Administered Medications Prior to Visit  Medication Dose Route Frequency Provider Last Rate Last Dose  . 0.9 %  sodium chloride infusion  500 mL Intravenous Continuous Pyrtle, Lajuan Lines, MD        ROS Review of Systems  Constitutional: Negative for activity change and appetite change.  HENT: Negative for sinus pressure and sore throat.   Eyes: Negative for visual disturbance.  Respiratory: Negative for cough, chest tightness and shortness of breath.   Cardiovascular: Negative for chest pain and leg swelling.  Gastrointestinal: Negative for abdominal distention, abdominal pain, constipation  and diarrhea.  Endocrine: Negative.   Genitourinary: Negative for dysuria.  Musculoskeletal: Negative for joint swelling and myalgias.  Skin: Negative for rash.  Allergic/Immunologic: Negative.   Neurological: Negative for weakness, light-headedness and numbness.  Psychiatric/Behavioral: Negative for dysphoric mood and suicidal ideas.    Objective:  BP (!) 168/81   Pulse 70   Temp 98.4 F (36.9 C) (Oral)   Ht 5' 9"  (1.753 m)   Wt 215 lb 12.8 oz (97.9 kg)   SpO2 100%   BMI 31.87 kg/m   BP/Weight 02/07/2018 8/41/3244 0/06/270  Systolic BP 536 644 034  Diastolic BP 81 79 80  Wt. (Lbs) 215.8 206 200  BMI 31.87 30.42 29.53      Physical Exam  Constitutional: He is oriented to person, place, and time. He appears well-developed and well-nourished.  Cardiovascular: Normal rate, normal heart sounds and intact distal pulses.  No murmur heard. Pulmonary/Chest: Effort normal and breath sounds normal. He has no wheezes. He has no rales. He exhibits no tenderness.  Abdominal: Soft. Bowel sounds are normal. He exhibits no distension and no mass. There is no tenderness.  Musculoskeletal: Normal range of motion.  No edema of both knees, no medial or lateral joint tenderness on exam Crepitus on range of motion, FROM  Neurological: He is alert and oriented to person, place, and time.  Skin: Skin is warm and dry.  Psychiatric: He has a normal mood and affect.    BMET    Component Value Date/Time   NA 134 (L) 07/28/2017 1245   K 4.0 07/28/2017 1245   CL 101 07/28/2017 1245   CO2 22 07/28/2017 1245   GLUCOSE 95 07/28/2017 1245   BUN 13 07/28/2017 1245   CREATININE 1.07 07/28/2017 1245   CREATININE 0.82 11/27/2014 1558   CALCIUM 8.6 (L) 07/28/2017 1245   GFRNONAA >60 07/28/2017 1245   GFRAA >60 07/28/2017 1245    Assessment & Plan:   1. Bilateral primary osteoarthritis of knee Controlled - naproxen (NAPROSYN) 500 MG tablet; Take 1 tablet (500 mg total) by mouth 2 (two) times  daily with a meal.  Dispense: 60 tablet; Refill: 1  2. Ulcerative colitis without complications, unspecified location ([place]) Controlled on mesalamine Followed by GI  3. Essential hypertension, benign Uncontrolled Commenced on amlodipine Counseled on blood pressure goal of less than 130/80, low-sodium, DASH diet, medication compliance, 150 minutes of moderate intensity exercise per week. Discussed medication compliance, adverse effects. - amLODipine (NORVASC) 5 MG tablet; Take 1 tablet (5 mg total) by mouth daily.  Dispense: 30 tablet; Refill: 3 - Basic Metabolic Panel   Meds ordered this encounter  Medications  . amLODipine (NORVASC) 5 MG tablet    Sig: Take 1 tablet (5 mg total) by mouth daily.    Dispense:  30 tablet    Refill:  3  . naproxen (NAPROSYN) 500 MG tablet    Sig:  Take 1 tablet (500 mg total) by mouth 2 (two) times daily with a meal.    Dispense:  60 tablet    Refill:  1    Follow-up: Return in about 3 months (around 05/10/2018) for Follow-up of chronic medical conditions.   [name] MD

## 2018-02-07 NOTE — Patient Instructions (Signed)

## 2018-02-08 ENCOUNTER — Telehealth: Payer: Self-pay

## 2018-02-08 LAB — BASIC METABOLIC PANEL
BUN/Creatinine Ratio: 11 (ref 10–24)
BUN: 11 mg/dL (ref 8–27)
CO2: 25 mmol/L (ref 20–29)
Calcium: 9.7 mg/dL (ref 8.6–10.2)
Chloride: 98 mmol/L (ref 96–106)
Creatinine, Ser: 0.98 mg/dL (ref 0.76–1.27)
GFR, EST AFRICAN AMERICAN: 87 mL/min/{1.73_m2} (ref >59)
GFR, EST NON AFRICAN AMERICAN: 76 mL/min/{1.73_m2} (ref >59)
Glucose: 74 mg/dL (ref 65–99)
Potassium: 4.3 mmol/L (ref 3.5–5.2)
Sodium: 136 mmol/L (ref 134–144)

## 2018-02-08 NOTE — Telephone Encounter (Signed)
-----   Message from [name], MD sent at 02/08/2018  8:50 AM EDT ----- Please inform the patient that labs are normal. Thank you.

## 2018-02-08 NOTE — Telephone Encounter (Signed)
Patient was called and informed of lab results. 

## 2018-06-13 HISTORY — PX: FLEXIBLE SIGMOIDOSCOPY: SHX1649

## 2018-06-14 ENCOUNTER — Ambulatory Visit: Payer: Self-pay | Admitting: Family Medicine

## 2018-07-10 ENCOUNTER — Encounter: Payer: Self-pay | Admitting: Family Medicine

## 2018-07-10 ENCOUNTER — Ambulatory Visit: Payer: Medicare HMO | Attending: Family Medicine | Admitting: Family Medicine

## 2018-07-10 VITALS — BP 155/73 | HR 60 | Temp 98.0°F | Ht 69.0 in | Wt 218.2 lb

## 2018-07-10 DIAGNOSIS — R05 Cough: Secondary | ICD-10-CM | POA: Diagnosis not present

## 2018-07-10 DIAGNOSIS — R0981 Nasal congestion: Secondary | ICD-10-CM

## 2018-07-10 DIAGNOSIS — K5190 Ulcerative colitis, unspecified, without complications: Secondary | ICD-10-CM | POA: Diagnosis not present

## 2018-07-10 DIAGNOSIS — I10 Essential (primary) hypertension: Secondary | ICD-10-CM | POA: Diagnosis not present

## 2018-07-10 DIAGNOSIS — R059 Cough, unspecified: Secondary | ICD-10-CM

## 2018-07-10 MED ORDER — CETIRIZINE HCL 10 MG PO TABS: 10.0000 mg | ORAL_TABLET | Freq: Every day | ORAL | 1 refills | Status: DC

## 2018-07-10 MED ORDER — HYDROCHLOROTHIAZIDE 25 MG PO TABS: 25.0000 mg | ORAL_TABLET | Freq: Every day | ORAL | 1 refills | Status: DC

## 2018-07-10 NOTE — Progress Notes (Signed)
Subjective:  Patient ID: Robert Rhodes, male    DOB: [birth year]  Age: 73 y.o. MRN: [id number]  CC: Hypertension and Cough   HPI Robert Rhodes  is a 73 year old male with a history of hypertension , ulcerative colitis (on mesalamine, currently followed by GI-Dr. Zenovia Jarred), bilateral knee osteoarthritis who is here today for follow-up visit. I had started him on Amlodipine for mangement of Hypertension however he discontinued it due to muscle spasms which he associated with Amlodipine and these resolved on discontinuation of medications.  His Ulcerative colitis has been stable with no recent flares and he has been compliant with his medications.  He complains of a "cold' which he dry cough for the last 8 months, mucus production from his nose but no myalgias,night sweats,fever and has not used any OTC meds. . Past Medical History:  Diagnosis Date  . Adenomatous colon polyp   . Arthritis   . Claustrophobia   . Colon cancer ([place])   . Eye abnormalities    right eye seen in ED 02/17/2014 wearing eye patch using eye drops  . GERD (gastroesophageal reflux disease)   . Osteoarthritis    bilat knees  needs replacement  . Osteoporosis    pt denies  . Substance abuse ([place])   . UC (ulcerative colitis) ([place])   . Vitamin D deficiency     Past Surgical History:  Procedure Laterality Date  . COLON RESECTION N/A 08/23/2013   Procedure: Laparoscopic total abdominal colectomy and hernia repair;  Surgeon: Leighton Ruff, MD;  Location: [facility];  Service: General;  Laterality: N/A;  . COLON SURGERY    . COLONOSCOPY    . EUS N/A 07/18/2013   Procedure: UPPER ENDOSCOPIC ULTRASOUND (EUS) RADIAL;  Surgeon: Milus Banister, MD;  [facility];  Service: Endoscopy;  Laterality: N/A;  . EYE SURGERY    . HERNIA REPAIR    . INCISIONAL HERNIA REPAIR N/A 02/20/2014   Procedure: LAP ASSISTED INCISIONAL HERNIA REPAIR LYSIS OF ADHESIONS;  Surgeon: Leighton Ruff, MD;  Location: [facility];  Service:  General;  Laterality: N/A;  converted to open @ 0935   . INCISIONAL HERNIA REPAIR N/A 02/20/2014   Procedure: HERNIA REPAIR INCISIONAL;  Surgeon: Leighton Ruff, MD;  Location: [facility];  Service: General;  Laterality: N/A;  With MESH  . INTRAOCULAR LENS INSERTION Bilateral    6 yrs ago  . VENTRAL HERNIA REPAIR      Allergies  Allergen Reactions  . Ace Inhibitors Other (See Comments)    Cant remember  . Advil [Ibuprofen] Other (See Comments)    Muscle tightness  . Aspirin Nausea And Vomiting  . Tylenol [Acetaminophen] Other (See Comments)    Muscle tightness     Outpatient Medications Prior to Visit  Medication Sig Dispense Refill  . mesalamine (CANASA) 1000 MG suppository Place 1 suppository (1,000 mg total) rectally at bedtime. 90 suppository 1  . Multiple Vitamin (MULTIVITAMIN WITH MINERALS) TABS tablet Take 1 tablet by mouth daily at 12 noon.    . Omega 3 1200 MG CAPS Take 1 capsule by mouth daily.    . naproxen (NAPROSYN) 500 MG tablet Take 1 tablet (500 mg total) by mouth 2 (two) times daily with a meal. (Patient not taking: Reported on 07/10/2018) 60 tablet 1  . traMADol (ULTRAM) 50 MG tablet Take 1 tablet (50 mg total) by mouth every 8 (eight) hours as needed for severe pain. (Patient not taking: Reported on 11/21/2017) 40 tablet 0  .  amLODipine (NORVASC) 5 MG tablet Take 1 tablet (5 mg total) by mouth daily. (Patient not taking: Reported on 07/10/2018) 30 tablet 3   Facility-Administered Medications Prior to Visit  Medication Dose Route Frequency Provider Last Rate Last Dose  . 0.9 %  sodium chloride infusion  500 mL Intravenous Continuous Pyrtle, Lajuan Lines, MD        ROS Review of Systems  Constitutional: Negative for activity change and appetite change.  HENT: Positive for congestion. Negative for sinus pressure and sore throat.   Eyes: Negative for visual disturbance.  Respiratory: Positive for cough. Negative for chest tightness and shortness of breath.   Cardiovascular:  Negative for chest pain and leg swelling.  Gastrointestinal: Negative for abdominal distention, abdominal pain, constipation and diarrhea.  Endocrine: Negative.   Genitourinary: Negative for dysuria.  Musculoskeletal: Negative for joint swelling and myalgias.  Skin: Negative for rash.  Allergic/Immunologic: Negative.   Neurological: Negative for weakness, light-headedness and numbness.  Psychiatric/Behavioral: Negative for dysphoric mood and suicidal ideas.    Objective:  BP (!) 155/73   Pulse 60   Temp 98 F (36.7 C) (Oral)   Ht _0  (1.753 m)   Wt 218 lb 3.2 oz (99 kg)   SpO2 99%   BMI 32.22 kg/m   BP/Weight 07/10/2018 02/07/2018 02/14/8332  Systolic BP 832 919 166  Diastolic BP 73 81 79  Wt. (Lbs) 218.2 215.8 206  BMI 32.22 31.87 30.42      Physical Exam Constitutional:      Appearance: He is well-developed.  Cardiovascular:     Rate and Rhythm: Normal rate.     Heart sounds: Normal heart sounds. No murmur.  Pulmonary:     Effort: Pulmonary effort is normal.     Breath sounds: Normal breath sounds. No wheezing or rales.  Chest:     Chest wall: No tenderness.  Abdominal:     General: Bowel sounds are normal. There is no distension.     Palpations: Abdomen is soft. There is no mass.     Tenderness: There is no abdominal tenderness.     Comments: Scars from previous surgery  Musculoskeletal: Normal range of motion.  Neurological:     Mental Status: He is alert and oriented to person, place, and time.      CMP Latest Ref Rng & Units 02/07/2018 07/28/2017 08/21/2015  Glucose 65 - 99 mg/dL 74 95 60(L)  BUN 8 - 27 mg/dL _1 Creatinine 0.76 - 1.27 mg/dL 0.98 1.07 0.94  Sodium 134 - 144 mmol/L 136 134(L) 140  Potassium 3.5 - 5.2 mmol/L 4.3 4.0 3.7  Chloride 96 - 106 mmol/L 98 101 103  CO2 20 - 29 mmol/L _2 Calcium 8.6 - 10.2 mg/dL 9.7 8.6(L) 9.5  Total Protein 6.5 - 8.1 g/dL - 8.0 7.7  Total Bilirubin 0.3 - 1.2 mg/dL - 0.7 0.4  Alkaline Phos 38 - 126  U/L - 82 72  AST 15 - 41 U/L - 74(H) 18  ALT 17 - 63 U/L - 44 20     Assessment & Plan:   1. Essential hypertension, benign Uncontrolled Not taking Amlodipine due to intolerance Commence HCTZ Counseled on blood pressure goal of less than 130/80, low-sodium, DASH diet, medication compliance, 150 minutes of moderate intensity exercise per week. Discussed medication compliance, adverse effects. - hydrochlorothiazide (HYDRODIURIL) 25 MG tablet; Take 1 tablet (25 mg total) by mouth daily.  Dispense: 90 tablet; Refill: 1 - CMP14+EGFR; Future  2. Ulcerative colitis without complications, unspecified location ([place]) No acute flare Continue Mesalamine Follow up with GI  3. Cough Placed on antihistamine - cetirizine (ZYRTEC) 10 MG tablet; Take 1 tablet (10 mg total) by mouth daily.  Dispense: 30 tablet; Refill: 1   Meds ordered this encounter  Medications  . hydrochlorothiazide (HYDRODIURIL) 25 MG tablet    Sig: Take 1 tablet (25 mg total) by mouth daily.    Dispense:  90 tablet    Refill:  1  . cetirizine (ZYRTEC) 10 MG tablet    Sig: Take 1 tablet (10 mg total) by mouth daily.    Dispense:  30 tablet    Refill:  1    Follow-up: Return in about 6 months (around 01/08/2019) for Follow-up of chronic medical conditions.   [name] MD

## 2018-07-10 NOTE — Patient Instructions (Signed)

## 2018-07-12 ENCOUNTER — Telehealth: Payer: Self-pay | Admitting: Family Medicine

## 2018-07-12 ENCOUNTER — Encounter: Payer: Self-pay | Admitting: Internal Medicine

## 2018-07-12 NOTE — Telephone Encounter (Signed)
Pt called to request Flexogenix, instead of surgery, please follow up as soon as possible. Pt also states he needs a BP monitor sent to his [facility].

## 2018-07-13 MED ORDER — MISC. DEVICES MISC: 0 refills | Status: DC

## 2018-07-13 NOTE — Telephone Encounter (Signed)
Will route to PCP for review. 

## 2018-07-13 NOTE — Telephone Encounter (Signed)
He will have to look up flexogenic locations himself as I am unable to refer him; I referred him to Orthopedics.  I have sent a prescription for a blood pressure monitor to his pharmacy.

## 2018-07-16 NOTE — Telephone Encounter (Signed)
Patient was called and a voicemail was left informing patient to return phone call. 

## 2018-07-19 ENCOUNTER — Telehealth: Payer: Self-pay

## 2018-07-19 NOTE — Telephone Encounter (Signed)
Patient No Showed for PV. Patient was called and rescheduled for 07/25/18 @ 3:30 Pm. Patient was told to be here by 3:15.

## 2018-07-24 ENCOUNTER — Ambulatory Visit: Payer: Medicare HMO | Attending: Family Medicine

## 2018-07-24 DIAGNOSIS — I10 Essential (primary) hypertension: Secondary | ICD-10-CM | POA: Diagnosis not present

## 2018-07-25 ENCOUNTER — Ambulatory Visit (AMBULATORY_SURGERY_CENTER): Payer: Self-pay

## 2018-07-25 ENCOUNTER — Telehealth: Payer: Self-pay | Admitting: Internal Medicine

## 2018-07-25 VITALS — Ht 69.0 in | Wt 217.4 lb

## 2018-07-25 DIAGNOSIS — K5190 Ulcerative colitis, unspecified, without complications: Secondary | ICD-10-CM

## 2018-07-25 LAB — CMP14+EGFR
A/G RATIO: 1.1 — AB (ref 1.2–2.2)
ALBUMIN: 3.7 g/dL (ref 3.7–4.7)
ALT: 19 IU/L (ref 0–44)
AST: 24 IU/L (ref 0–40)
Alkaline Phosphatase: 87 IU/L (ref 39–117)
BUN / CREAT RATIO: 7 — AB (ref 10–24)
BUN: 7 mg/dL — AB (ref 8–27)
Bilirubin Total: 0.3 mg/dL (ref 0.0–1.2)
CALCIUM: 9.3 mg/dL (ref 8.6–10.2)
CO2: 23 mmol/L (ref 20–29)
Chloride: 105 mmol/L (ref 96–106)
Creatinine, Ser: 0.96 mg/dL (ref 0.76–1.27)
GFR, EST AFRICAN AMERICAN: 90 mL/min/{1.73_m2} (ref >59)
GFR, EST NON AFRICAN AMERICAN: 78 mL/min/{1.73_m2} (ref >59)
Globulin, Total: 3.3 g/dL (ref 1.5–4.5)
Glucose: 75 mg/dL (ref 65–99)
Potassium: 4.3 mmol/L (ref 3.5–5.2)
Sodium: 136 mmol/L (ref 134–144)
TOTAL PROTEIN: 7 g/dL (ref 6.0–8.5)

## 2018-07-25 NOTE — Progress Notes (Signed)
Per pt, no allergies to soy or egg products.Pt not taking any weight loss meds or using  O2 at home.  Pt refused emmi video. 

## 2018-07-26 ENCOUNTER — Telehealth: Payer: Self-pay

## 2018-07-26 MED ORDER — MESALAMINE 1000 MG RE SUPP: 1000.0000 mg | Freq: Every day | RECTAL | 0 refills | Status: DC

## 2018-07-26 NOTE — Telephone Encounter (Signed)
Rx sent 

## 2018-07-26 NOTE — Telephone Encounter (Signed)
Patient was called and a voicemail was left informing patient to return phone call for lab results. 

## 2018-07-26 NOTE — Telephone Encounter (Signed)
-----   Message from [name], MD sent at 07/25/2018  2:10 PM EST ----- Please inform the patient that labs are normal. Thank you.

## 2018-07-27 NOTE — Telephone Encounter (Signed)
Patient was called and a voicemail was left informing patient to contact office for lab results.

## 2018-08-03 ENCOUNTER — Ambulatory Visit (AMBULATORY_SURGERY_CENTER): Payer: Medicare HMO | Admitting: Internal Medicine

## 2018-08-03 ENCOUNTER — Encounter: Payer: Self-pay | Admitting: Internal Medicine

## 2018-08-03 VITALS — BP 97/64 | HR 77 | Temp 97.1°F | Resp 16 | Ht 69.0 in | Wt 217.0 lb

## 2018-08-03 DIAGNOSIS — K529 Noninfective gastroenteritis and colitis, unspecified: Secondary | ICD-10-CM | POA: Diagnosis not present

## 2018-08-03 DIAGNOSIS — K51919 Ulcerative colitis, unspecified with unspecified complications: Secondary | ICD-10-CM

## 2018-08-03 DIAGNOSIS — Z85038 Personal history of other malignant neoplasm of large intestine: Secondary | ICD-10-CM | POA: Diagnosis not present

## 2018-08-03 MED ORDER — SODIUM CHLORIDE 0.9 % IV SOLN: 500.0000 mL | Freq: Once | INTRAVENOUS | Status: DC

## 2018-08-03 MED ORDER — PANTOPRAZOLE SODIUM 40 MG PO TBEC: 40.0000 mg | DELAYED_RELEASE_TABLET | Freq: Every day | ORAL | 3 refills | Status: DC

## 2018-08-03 MED ORDER — MESALAMINE 1000 MG RE SUPP: 1000.0000 mg | Freq: Every day | RECTAL | 4 refills | Status: DC

## 2018-08-03 NOTE — Progress Notes (Signed)
Pt's states no medical or surgical changes since previsit or office visit. 

## 2018-08-03 NOTE — Progress Notes (Signed)
Pt awake. VSS. Report given to RN. No anesthetic complications noted

## 2018-08-03 NOTE — Progress Notes (Signed)
Called to room to assist during endoscopic procedure.  Patient ID and intended procedure confirmed with present staff. Received instructions for my participation in the procedure from the performing physician.  

## 2018-08-03 NOTE — Patient Instructions (Addendum)
Biopsies were taken today to test for UC (ulcerative colitis). Start with a small meal. Here are some suggestions as to what to eat for this first meal: Eggs Grits Toast Pancakes/waffles Lean meat such as a sandwich  Start Pantoprazole 51m by mouth daily.  Take on an empty stomach 373mutes before breakfast EVERYDAY. Canasa suppository 1 gram nightly and plan indefinite therapy.     [facility]   Refer to the procedure report that was given to you for any specific questions about what was found during the examination.  If the procedure report does not answer your questions, please call your gastroenterologist to clarify.  If you requested that your care partner not be given the details of your procedure findings, then the procedure report has been included in a sealed envelope for you to review at your convenience later.  YOU SHOULD EXPECT: Some feelings of bloating in the abdomen. Passage of more gas than usual.  Walking can help get rid of the air that was put into your GI tract during the procedure and reduce the bloating. If you had a lower endoscopy (such as a colonoscopy or flexible sigmoidoscopy) you may notice spotting of blood in your stool or on the toilet paper. If you underwent a bowel prep for your procedure, you may not have a normal bowel movement for a few days.  Please Note:  You might notice some irritation and congestion in your nose or some drainage.  This is from the oxygen used during your procedure.  There is no need for concern and it should clear up in a day or so.  SYMPTOMS TO REPORT IMMEDIATELY:   Following lower endoscopy (colonoscopy or flexible sigmoidoscopy):  Excessive amounts of blood in the stool  Significant tenderness or worsening of abdominal pains  Swelling of the abdomen that is new, acute  Fever of 100F or higher   For urgent or emergent issues, a gastroenterologist can be reached at  any hour by calling [phone]  DIET:  We do recommend a small meal at first, but then you may proceed to your regular diet.  Drink plenty of fluids but you should avoid alcoholic beverages for 24 hours.  ACTIVITY:  You should plan to take it easy for the rest of today and you should NOT DRIVE or use heavy machinery until tomorrow (because of the sedation medicines used during the test).    FOLLOW UP: Our staff will call the number listed on your records the next business day following your procedure to check on you and address any questions or concerns that you may have regarding the information given to you following your procedure. If we do not reach you, we will leave a message.  However, if you are feeling well and you are not experiencing any problems, there is no need to return our call.  We will assume that you have returned to your regular daily activities without incident.  If any biopsies were taken you will be contacted by phone or by letter within the next 1-3 weeks.  Please call [facility] ([phone] you have not heard about the biopsies in 3 weeks.    SIGNATURES/CONFIDENTIALITY: You and/or your care partner have signed paperwork which will be entered into your electronic medical record.  These signatures attest to the fact that that the information above on your After Visit Summary has been reviewed and is understood.  Full responsibility of  the confidentiality of this discharge information lies with you and/or your care-partner.

## 2018-08-03 NOTE — Op Note (Signed)
[place] Patient Name: Robert Rhodes Procedure Date: 08/03/2018 2:17 PM MRN: [id number] Endoscopist: Jerene Bears , MD Age: 73 Referring MD:  Date of Birth: 11/20/1945 Gender: Male Account #: [id number] Procedure:                Flexible Sigmoidoscopy Indications:              High risk colon cancer surveillance: Personal                            history of colon cancer, High risk colon cancer                            surveillance: Ulcerative pancolitis of 8 (or more)                            years duration, status post subtotal colectomy with                            ileorectal anastomosis in 2015, surveillance                            flexible sigmoidoscopy 2016 and 2018 Medicines:                Monitored Anesthesia Care Procedure:                Pre-Anesthesia Assessment:                           - Prior to the procedure, a History and Physical                            was performed, and patient medications and                            allergies were reviewed. The patient's tolerance of                            previous anesthesia was also reviewed. The risks                            and benefits of the procedure and the sedation                            options and risks were discussed with the patient.                            All questions were answered, and informed consent                            was obtained. Prior Anticoagulants: The patient has                            taken no previous anticoagulant or antiplatelet  agents. ASA Grade Assessment: II - A patient with                            mild systemic disease. After reviewing the risks                            and benefits, the patient was deemed in                            satisfactory condition to undergo the procedure.                           After obtaining informed consent, the scope was                            passed under direct  vision. The Model PCF-H190DL                            [id number]) scope was introduced through the anus                            and advanced to the ileo-rectal anastomosis. The                            flexible sigmoidoscopy was accomplished without                            difficulty. The patient tolerated the procedure                            well. The quality of the bowel preparation was good. Scope In: 2:30:16 PM Scope Out: 2:36:02 PM Scope Withdrawal Time: 0 hours 4 minutes 46 seconds  Total Procedure Duration: 0 hours 5 minutes 46 seconds  Findings:                 The digital rectal exam was normal.                           There was evidence of a prior end-to-side                            ileo-rectal anastomosis in the proximal rectum.                            This was patent and was characterized by healthy                            appearing mucosa. The anastomosis was traversed.                            The examined neoterminal ileum is normal.                           Inflammation was found in a continuous and  circumferential pattern in the rectum (from dentate                            line to ileorectal anastomosis). This was graded as                            Mayo Score 2 (moderate, with marked erythema,                            absent vascular pattern, friability, erosions), and                            when compared to the previous examination, the                            findings are worsened. Biopsies were taken with a                            cold forceps from the rectum (4-quadrant every 4-5                            cm) for dysplasia surveillance and ulcerative                            colitis surveillance. These biopsy specimens from                            the proximal and distal rectum were sent to                            Pathology.                           Retroflexion in the rectum was not  performed due to                            inflammation and narrow rectal vault. Complications:            No immediate complications. Estimated Blood Loss:     Estimated blood loss was minimal. Impression:               - Patent end-to-side ileo-rectal anastomosis,                            characterized by healthy appearing mucosa.                           - Moderately active (Mayo Score 2) ulcerative                            colitis, worsened since the last examination.                            Biopsied. Recommendation:           - Patient has a contact number  available for                            emergencies. The signs and symptoms of potential                            delayed complications were discussed with the                            patient. Return to normal activities tomorrow.                            Written discharge instructions were provided to the                            patient.                           - Resume previous diet.                           - Continue present medications. Patient has been                            out of Canasa suppositories which likely explains                            increased proctitis today. Resume Canasa 1 g                            nightly and plan indefinite therapy.                           - Await pathology results.                           - Repeat flexible sigmoidoscopy for surveillance                            based on pathology results.                           - At time of procedure patient reporting increased                            indigestion and heartburn. Begin pantoprazole 40 mg                            daily. Office follow-up in 4 to 6 weeks with me or                            APP for reassessment. Jerene Bears, MD 08/03/2018 2:51:37 PM This report has been signed electronically.

## 2018-08-06 ENCOUNTER — Telehealth: Payer: Self-pay

## 2018-08-06 ENCOUNTER — Telehealth: Payer: Self-pay | Admitting: *Deleted

## 2018-08-06 NOTE — Telephone Encounter (Signed)
Called [phone] and left a messaged we tried to reach pt for a follow up call. maw

## 2018-08-06 NOTE — Telephone Encounter (Signed)
First attempt, left VM.

## 2018-08-10 ENCOUNTER — Encounter: Payer: Self-pay | Admitting: Internal Medicine

## 2018-08-16 ENCOUNTER — Telehealth: Payer: Self-pay | Admitting: Family Medicine

## 2018-08-16 NOTE — Telephone Encounter (Signed)
Pt called back regarding VM left about his results would like a follow up he also states mon/wed he has his phone turned off from 9am-1pm anytime after that would be good

## 2018-08-20 NOTE — Telephone Encounter (Signed)
Patient was called and informed of lab results. 

## 2018-09-06 ENCOUNTER — Other Ambulatory Visit: Payer: Self-pay

## 2018-09-06 ENCOUNTER — Telehealth: Payer: Self-pay | Admitting: *Deleted

## 2018-09-06 ENCOUNTER — Telehealth: Payer: Medicare HMO | Admitting: Internal Medicine

## 2018-09-06 DIAGNOSIS — K51919 Ulcerative colitis, unspecified with unspecified complications: Secondary | ICD-10-CM

## 2018-09-06 MED ORDER — MESALAMINE 1000 MG RE SUPP: 1000.0000 mg | Freq: Every day | RECTAL | 1 refills | Status: DC

## 2018-09-06 NOTE — Telephone Encounter (Signed)
I have spoken to patient at the request of Dr Hilarie Fredrickson. Patient has been advised that his biopsies from colonoscopy did show active inflammation. However, at that time, we realize he was not on canasa. I asked if he has since restarted and he assures me that he has done so. He indicates that on a scale of 0 to 100% (100% being the best), that he feels about 90% better. He denies any rectal bleeding or pain at this time. He does need a refill on canasa and I assured him that we would send that for him.   I have advised Dr Hilarie Fredrickson of this information as well.

## 2018-09-10 ENCOUNTER — Telehealth: Payer: Self-pay | Admitting: *Deleted

## 2018-09-10 NOTE — Telephone Encounter (Signed)
[employer] insurance has approved patient's mesalamine 1,000 mg suppositories until 06/13/19.   Patient has ulcerative colitis with recent biopsies showing active proctitis.   He has tried rowasa enemas, Lialda and sulfasalazine in the past.

## 2018-12-31 DIAGNOSIS — H52203 Unspecified astigmatism, bilateral: Secondary | ICD-10-CM | POA: Diagnosis not present

## 2018-12-31 DIAGNOSIS — H30001 Unspecified focal chorioretinal inflammation, right eye: Secondary | ICD-10-CM | POA: Diagnosis not present

## 2018-12-31 DIAGNOSIS — H5203 Hypermetropia, bilateral: Secondary | ICD-10-CM | POA: Diagnosis not present

## 2018-12-31 DIAGNOSIS — H43393 Other vitreous opacities, bilateral: Secondary | ICD-10-CM | POA: Diagnosis not present

## 2018-12-31 DIAGNOSIS — H40013 Open angle with borderline findings, low risk, bilateral: Secondary | ICD-10-CM | POA: Diagnosis not present

## 2018-12-31 DIAGNOSIS — H524 Presbyopia: Secondary | ICD-10-CM | POA: Diagnosis not present

## 2018-12-31 DIAGNOSIS — H3554 Dystrophies primarily involving the retinal pigment epithelium: Secondary | ICD-10-CM | POA: Diagnosis not present

## 2018-12-31 DIAGNOSIS — H3589 Other specified retinal disorders: Secondary | ICD-10-CM | POA: Diagnosis not present

## 2018-12-31 DIAGNOSIS — H43813 Vitreous degeneration, bilateral: Secondary | ICD-10-CM | POA: Diagnosis not present

## 2019-04-19 ENCOUNTER — Other Ambulatory Visit: Payer: Self-pay

## 2019-04-19 ENCOUNTER — Emergency Department (HOSPITAL_COMMUNITY): Payer: Medicare HMO

## 2019-04-19 ENCOUNTER — Encounter (HOSPITAL_COMMUNITY): Payer: Self-pay

## 2019-04-19 ENCOUNTER — Emergency Department (HOSPITAL_COMMUNITY)
Admission: EM | Admit: 2019-04-19 | Discharge: 2019-04-19 | Disposition: A | Discharge status: Home / Self Care | Payer: Medicare HMO | Source: Home / Self Care | Attending: Emergency Medicine | Admitting: Emergency Medicine

## 2019-04-19 DIAGNOSIS — F3162 Bipolar disorder, current episode mixed, moderate: Secondary | ICD-10-CM | POA: Diagnosis present

## 2019-04-19 DIAGNOSIS — R1030 Lower abdominal pain, unspecified: Secondary | ICD-10-CM | POA: Insufficient documentation

## 2019-04-19 DIAGNOSIS — Z85038 Personal history of other malignant neoplasm of large intestine: Secondary | ICD-10-CM | POA: Diagnosis not present

## 2019-04-19 DIAGNOSIS — J1289 Other viral pneumonia: Secondary | ICD-10-CM | POA: Diagnosis present

## 2019-04-19 DIAGNOSIS — Z8719 Personal history of other diseases of the digestive system: Secondary | ICD-10-CM | POA: Diagnosis not present

## 2019-04-19 DIAGNOSIS — R519 Headache, unspecified: Secondary | ICD-10-CM | POA: Diagnosis not present

## 2019-04-19 DIAGNOSIS — A0839 Other viral enteritis: Secondary | ICD-10-CM | POA: Diagnosis present

## 2019-04-19 DIAGNOSIS — M810 Age-related osteoporosis without current pathological fracture: Secondary | ICD-10-CM | POA: Diagnosis present

## 2019-04-19 DIAGNOSIS — K9189 Other postprocedural complications and disorders of digestive system: Secondary | ICD-10-CM | POA: Diagnosis not present

## 2019-04-19 DIAGNOSIS — N179 Acute kidney failure, unspecified: Secondary | ICD-10-CM | POA: Diagnosis present

## 2019-04-19 DIAGNOSIS — I951 Orthostatic hypotension: Secondary | ICD-10-CM | POA: Diagnosis not present

## 2019-04-19 DIAGNOSIS — E860 Dehydration: Secondary | ICD-10-CM | POA: Diagnosis present

## 2019-04-19 DIAGNOSIS — L89152 Pressure ulcer of sacral region, stage 2: Secondary | ICD-10-CM | POA: Diagnosis present

## 2019-04-19 DIAGNOSIS — U071 COVID-19: Secondary | ICD-10-CM | POA: Diagnosis present

## 2019-04-19 DIAGNOSIS — Z9049 Acquired absence of other specified parts of digestive tract: Secondary | ICD-10-CM | POA: Diagnosis not present

## 2019-04-19 DIAGNOSIS — R197 Diarrhea, unspecified: Secondary | ICD-10-CM | POA: Diagnosis not present

## 2019-04-19 DIAGNOSIS — R42 Dizziness and giddiness: Secondary | ICD-10-CM | POA: Diagnosis present

## 2019-04-19 DIAGNOSIS — I10 Essential (primary) hypertension: Secondary | ICD-10-CM | POA: Diagnosis present

## 2019-04-19 DIAGNOSIS — K219 Gastro-esophageal reflux disease without esophagitis: Secondary | ICD-10-CM | POA: Diagnosis present

## 2019-04-19 DIAGNOSIS — E669 Obesity, unspecified: Secondary | ICD-10-CM | POA: Diagnosis present

## 2019-04-19 DIAGNOSIS — R918 Other nonspecific abnormal finding of lung field: Secondary | ICD-10-CM | POA: Diagnosis not present

## 2019-04-19 DIAGNOSIS — Z6830 Body mass index (BMI) 30.0-30.9, adult: Secondary | ICD-10-CM | POA: Diagnosis not present

## 2019-04-19 DIAGNOSIS — E861 Hypovolemia: Secondary | ICD-10-CM | POA: Diagnosis present

## 2019-04-19 DIAGNOSIS — Z980 Intestinal bypass and anastomosis status: Secondary | ICD-10-CM | POA: Diagnosis not present

## 2019-04-19 DIAGNOSIS — E876 Hypokalemia: Secondary | ICD-10-CM | POA: Diagnosis present

## 2019-04-19 DIAGNOSIS — E872 Acidosis: Secondary | ICD-10-CM | POA: Diagnosis present

## 2019-04-19 DIAGNOSIS — M170 Bilateral primary osteoarthritis of knee: Secondary | ICD-10-CM | POA: Diagnosis present

## 2019-04-19 DIAGNOSIS — K5190 Ulcerative colitis, unspecified, without complications: Secondary | ICD-10-CM | POA: Diagnosis present

## 2019-04-19 DIAGNOSIS — R5381 Other malaise: Secondary | ICD-10-CM | POA: Diagnosis not present

## 2019-04-19 DIAGNOSIS — Z79899 Other long term (current) drug therapy: Secondary | ICD-10-CM | POA: Insufficient documentation

## 2019-04-19 DIAGNOSIS — Z87891 Personal history of nicotine dependence: Secondary | ICD-10-CM | POA: Diagnosis not present

## 2019-04-19 DIAGNOSIS — E871 Hypo-osmolality and hyponatremia: Secondary | ICD-10-CM | POA: Diagnosis present

## 2019-04-19 LAB — I-STAT CHEM 8, ED
BUN: 10 mg/dL (ref 8–23)
Calcium, Ion: 1.14 mmol/L — ABNORMAL LOW (ref 1.15–1.40)
Chloride: 100 mmol/L (ref 98–111)
Creatinine, Ser: 0.9 mg/dL (ref 0.61–1.24)
Glucose, Bld: 104 mg/dL — ABNORMAL HIGH (ref 70–99)
HCT: 46 % (ref 39.0–52.0)
Hemoglobin: 15.6 g/dL (ref 13.0–17.0)
Potassium: 3.9 mmol/L (ref 3.5–5.1)
Sodium: 137 mmol/L (ref 135–145)
TCO2: 25 mmol/L (ref 22–32)

## 2019-04-19 LAB — URINALYSIS, ROUTINE W REFLEX MICROSCOPIC
Bacteria, UA: NONE SEEN
Bilirubin Urine: NEGATIVE
Glucose, UA: NEGATIVE mg/dL
Ketones, ur: NEGATIVE mg/dL
Leukocytes,Ua: NEGATIVE
Nitrite: NEGATIVE
Protein, ur: NEGATIVE mg/dL
Specific Gravity, Urine: 1.024 (ref 1.005–1.030)
pH: 5 (ref 5.0–8.0)

## 2019-04-19 LAB — COMPREHENSIVE METABOLIC PANEL
ALT: 34 U/L (ref 0–44)
AST: 47 U/L — ABNORMAL HIGH (ref 15–41)
Albumin: 3.1 g/dL — ABNORMAL LOW (ref 3.5–5.0)
Alkaline Phosphatase: 69 U/L (ref 38–126)
Anion gap: 9 (ref 5–15)
BUN: 8 mg/dL (ref 8–23)
CO2: 24 mmol/L (ref 22–32)
Calcium: 8.3 mg/dL — ABNORMAL LOW (ref 8.9–10.3)
Chloride: 103 mmol/L (ref 98–111)
Creatinine, Ser: 1.01 mg/dL (ref 0.61–1.24)
GFR calc Af Amer: >60 mL/min (ref >60)
GFR calc non Af Amer: >60 mL/min (ref >60)
Glucose, Bld: 106 mg/dL — ABNORMAL HIGH (ref 70–99)
Potassium: 3.8 mmol/L (ref 3.5–5.1)
Sodium: 136 mmol/L (ref 135–145)
Total Bilirubin: 0.6 mg/dL (ref 0.3–1.2)
Total Protein: 7.2 g/dL (ref 6.5–8.1)

## 2019-04-19 LAB — ETHANOL: Alcohol, Ethyl (B): <10 mg/dL (ref <10)

## 2019-04-19 LAB — RAPID URINE DRUG SCREEN, HOSP PERFORMED
Amphetamines: NOT DETECTED
Barbiturates: NOT DETECTED
Benzodiazepines: NOT DETECTED
Cocaine: NOT DETECTED
Opiates: NOT DETECTED
Tetrahydrocannabinol: NOT DETECTED

## 2019-04-19 LAB — CBC WITH DIFFERENTIAL/PLATELET
Abs Immature Granulocytes: 0.01 10*3/uL (ref 0.00–0.07)
Basophils Absolute: 0 10*3/uL (ref 0.0–0.1)
Basophils Relative: 0 %
Eosinophils Absolute: 0 10*3/uL (ref 0.0–0.5)
Eosinophils Relative: 0 %
HCT: 41.6 % (ref 39.0–52.0)
Hemoglobin: 13.3 g/dL (ref 13.0–17.0)
Immature Granulocytes: 0 %
Lymphocytes Relative: 25 %
Lymphs Abs: 1.6 10*3/uL (ref 0.7–4.0)
MCH: 27.7 pg (ref 26.0–34.0)
MCHC: 32 g/dL (ref 30.0–36.0)
MCV: 86.7 fL (ref 80.0–100.0)
Monocytes Absolute: 0.5 10*3/uL (ref 0.1–1.0)
Monocytes Relative: 7 %
Neutro Abs: 4.2 10*3/uL (ref 1.7–7.7)
Neutrophils Relative %: 68 %
Platelets: 349 10*3/uL (ref 150–400)
RBC: 4.8 MIL/uL (ref 4.22–5.81)
RDW: 14.3 % (ref 11.5–15.5)
WBC: 6.3 10*3/uL (ref 4.0–10.5)
nRBC: 0 % (ref 0.0–0.2)

## 2019-04-19 LAB — TROPONIN I (HIGH SENSITIVITY)
Troponin I (High Sensitivity): 7 ng/L (ref <18)
Troponin I (High Sensitivity): 6 ng/L (ref <18)

## 2019-04-19 LAB — PROTIME-INR
INR: 0.9 (ref 0.8–1.2)
Prothrombin Time: 12.2 seconds (ref 11.4–15.2)

## 2019-04-19 LAB — CBG MONITORING, ED: Glucose-Capillary: 103 mg/dL — ABNORMAL HIGH (ref 70–99)

## 2019-04-19 LAB — LIPASE, BLOOD: Lipase: 21 U/L (ref 11–51)

## 2019-04-19 LAB — APTT: aPTT: 23 seconds — ABNORMAL LOW (ref 24–36)

## 2019-04-19 LAB — SARS CORONAVIRUS 2 (TAT 6-24 HRS): SARS Coronavirus 2: POSITIVE — AB

## 2019-04-19 MED ORDER — IOHEXOL 300 MG/ML  SOLN
100.0000 mL | Freq: Once | INTRAMUSCULAR | Status: AC | PRN
  Administered 2019-04-19: 100 mL via INTRAVENOUS

## 2019-04-19 MED ORDER — METRONIDAZOLE 500 MG PO TABS: 500.0000 mg | ORAL_TABLET | Freq: Two times a day (BID) | ORAL | 0 refills | Status: DC

## 2019-04-19 MED ORDER — METRONIDAZOLE IN NACL 5-0.79 MG/ML-% IV SOLN
500.0000 mg | Freq: Once | INTRAVENOUS | Status: AC
  Administered 2019-04-19: 14:00:00 500 mg via INTRAVENOUS
  Filled 2019-04-19: qty 100

## 2019-04-19 MED ORDER — CIPROFLOXACIN IN D5W 400 MG/200ML IV SOLN
400.0000 mg | Freq: Once | INTRAVENOUS | Status: AC
  Administered 2019-04-19: 400 mg via INTRAVENOUS
  Filled 2019-04-19: qty 200

## 2019-04-19 MED ORDER — CIPROFLOXACIN HCL 500 MG PO TABS: 500.0000 mg | ORAL_TABLET | Freq: Two times a day (BID) | ORAL | 0 refills | Status: DC

## 2019-04-19 NOTE — ED Triage Notes (Signed)
Pt brought in by EMS with a chief complaint of dizziness. EMS states his BP was initially above 170 and at the time of arrival EMS reports a  SBP of 146. Pt states he started to feel dizzy last night and it has not stopped.

## 2019-04-19 NOTE — ED Notes (Signed)
PT REFUSED MRI

## 2019-04-19 NOTE — ED Provider Notes (Signed)
[facility] EMERGENCY DEPARTMENT Provider Note   CSN: [id number] Arrival date & time: 04/19/19  0626     History   Chief Complaint Chief Complaint  Patient presents with  . Dizziness    HPI Robert Rhodes is a 73 y.o. male.     Patient with history of ulcerative colitis and colon cancer status post bowel resection, acid reflux, osteoarthritis presenting from home with "dizziness" and near syncope.  Patient states he was last normal about 2 AM.  He states has been having a large episodes of diarrhea for the past several days that has been nonbloody.  He believes he might of lost too much fluid.  He describes a spinning sensation as well as a dizziness and lightheadedness that is worse when he gets up.  States he feels off balance and feels like he is going to fall over.  He did not fall or pass out.  Did not hit his head.  Denies any focal weakness in his arms or legs.  Denies any difficulty speaking or difficulty swallowing.  Denies any chest pain or shortness of breath.  Since he is gone to the ED he is having "cramps" in his lower abdomen that are uncomfortable and coming and going.  He states he does not feel dizzy until he gets up and tries moving around.  Then he begins to feel lightheaded and has room spinning as well.  Ulcerative colitis with history of colon cancer status post subtotal colectomy with ileorectal anastomosis   The history is provided by the patient and the EMS personnel.  Dizziness Associated symptoms: diarrhea and weakness   Associated symptoms: no chest pain, no headaches, no nausea, no shortness of breath and no vomiting     Past Medical History:  Diagnosis Date  . Adenomatous colon polyp   . Arthritis   . Claustrophobia   . Colon cancer ([place]) 2015  . Eye abnormalities    right eye seen in ED 02/17/2014 wearing eye patch using eye drops  . GERD (gastroesophageal reflux disease)   . Hypertension   . Irregular heartbeat    per  pt/doesn't know what it does  . Osteoarthritis    bilat knees  needs replacement  . Osteoporosis    pt denies  . Substance abuse ([place])    stopped over 3 years ago  . UC (ulcerative colitis) ([place])   . Vitamin D deficiency     Patient Active Problem List   Diagnosis Date Noted  . Osteoporosis   . Ulcerative colitis, with rectal bleeding 08/06/2015  . Gastroesophageal reflux disease without esophagitis 08/06/2015  . Visual impairment of right eye 06/18/2015  . Essential hypertension, benign 06/18/2015  . Hernia of abdominal cavity 11/27/2014  . Bipolar 1 disorder, mixed, moderate ([place]) 11/27/2014  . Protein-calorie malnutrition, severe ([place]) 08/24/2014  . Cellulitis 08/23/2014  . Medically noncompliant 08/23/2014  . Left leg cellulitis 08/23/2014  . Cellulitis of left leg   . Hypokalemia   . Diarrhea 05/06/2014  . Ulcerative colitis ([place]) 04/17/2014  . Verrucous keratosis 04/17/2014  . Nonspecific (abnormal) findings on radiological and other examination of gastrointestinal tract 07/18/2013  . Acute esophagitis 07/18/2013  . Colon cancer ([place]) 07/16/2013  . Hypovitaminosis D 05/06/2013  . Ulcerative colitis, unspecified 04/04/2013  . Knee pain, bilateral 03/05/2013    Past Surgical History:  Procedure Laterality Date  . COLON RESECTION N/A 08/23/2013   Procedure: Laparoscopic total abdominal colectomy and hernia repair;  Surgeon: Leighton Ruff, MD;  Location: [facility];  Service: General;  Laterality: N/A;  . COLON SURGERY  08/2013  . COLONOSCOPY    . EUS N/A 07/18/2013   Procedure: UPPER ENDOSCOPIC ULTRASOUND (EUS) RADIAL;  Surgeon: Milus Banister, MD;  Location: [facility];  Service: Endoscopy;  Laterality: N/A;  . EYE SURGERY    . HERNIA REPAIR  2005   In [place]/put in a mesh  . INCISIONAL HERNIA REPAIR N/A 02/20/2014   Procedure: LAP ASSISTED INCISIONAL HERNIA REPAIR LYSIS OF ADHESIONS;  Surgeon: Leighton Ruff, MD;  Location: [facility];  Service: General;  Laterality: N/A;   converted to open @ 0935  . INCISIONAL HERNIA REPAIR N/A 02/20/2014   Procedure: HERNIA REPAIR INCISIONAL;  Surgeon: Leighton Ruff, MD;  Location: [facility];  Service: General;  Laterality: N/A;  With MESH  . INTRAOCULAR LENS INSERTION Bilateral    6 yrs ago  . VENTRAL HERNIA REPAIR          Home Medications    Prior to Admission medications   Medication Sig Start Date End Date Taking? Authorizing Provider  cetirizine (ZYRTEC) 10 MG tablet Take 1 tablet (10 mg total) by mouth daily. 07/10/18   [name], MD  hydrochlorothiazide (HYDRODIURIL) 25 MG tablet Take 1 tablet (25 mg total) by mouth daily. 07/10/18   [name], MD  mesalamine (CANASA) 1000 MG suppository Place 1 suppository (1,000 mg total) rectally at bedtime. 09/06/18   Pyrtle, Lajuan Lines, MD  Misc. Devices MISC Blood pressure machine.  Diagnosis-hypertension Patient not taking: Reported on 07/25/2018 07/13/18   [name], MD  Multiple Vitamin (MULTIVITAMIN WITH MINERALS) TABS tablet Take 1 tablet by mouth daily at 12 noon. 08/26/14   Ghimire, Henreitta Leber, MD  naproxen (NAPROSYN) 500 MG tablet Take 1 tablet (500 mg total) by mouth 2 (two) times daily with a meal. Patient not taking: Reported on 07/10/2018 02/07/18   [name], MD  NON FORMULARY Fleets Enema-one bottle-Take as directed    [provider]  Omega 3 1200 MG CAPS Take 1 capsule by mouth daily.    [provider]  pantoprazole (PROTONIX) 40 MG tablet Take 1 tablet (40 mg total) by mouth daily. 08/03/18   Pyrtle, Lajuan Lines, MD    Family History Family History  Problem Relation Age of Onset  . Cancer Mother        type unknown  . Diabetes Father   . Heart disease Father   . Heart disease Sister   . Heart attack Brother   . Healthy Sister   . Colon cancer Neg Hx   . Esophageal cancer Neg Hx   . Rectal cancer Neg Hx   . Stomach cancer Neg Hx     Social History Social History   Tobacco Use  . Smoking status: Former Smoker     Packs/day: 0.00    Types: Cigarettes    Quit date: 06/14/2003    Years since quitting: 15.8  . Smokeless tobacco: Never Used  . Tobacco comment: quit in July 2005  Substance Use Topics  . Alcohol use: No  . Drug use: Not Currently    Types: 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     Allergies   Ace inhibitors, Advil [ibuprofen], Aspirin, and Tylenol [acetaminophen]   Review of Systems Review of Systems  Constitutional: Negative for activity change, appetite change, fatigue and fever.  Respiratory: Negative for cough, chest tightness and shortness of breath.   Cardiovascular: Negative for chest pain and  leg swelling.  Gastrointestinal: Positive for abdominal pain and diarrhea. Negative for nausea and vomiting.  Genitourinary: Negative for dysuria and hematuria.  Neurological: Positive for dizziness, weakness and light-headedness. Negative for syncope, numbness and headaches.    all other systems are negative except as noted in the HPI and PMH. \  Physical Exam Updated Vital Signs BP 124/77 (BP Location: Right Arm)   Pulse 76   Temp 98 F (36.7 C) (Oral)   Resp 16   Ht 5' 9"  (1.753 m)   Wt 93.4 kg   SpO2 96%   BMI 30.42 kg/m   Physical Exam Vitals signs and nursing note reviewed.  Constitutional:      General: He is not in acute distress.    Appearance: He is well-developed.  HENT:     Head: Normocephalic and atraumatic.     Mouth/Throat:     Pharynx: No oropharyngeal exudate.  Eyes:     Conjunctiva/sclera: Conjunctivae normal.     Pupils: Pupils are equal, round, and reactive to light.  Neck:     Musculoskeletal: Normal range of motion and neck supple.     Comments: No meningismus. Cardiovascular:     Rate and Rhythm: Normal rate and regular rhythm.     Heart sounds: Normal heart sounds. No murmur.  Pulmonary:     Effort: Pulmonary effort is normal. No respiratory distress.     Breath sounds: Normal breath sounds.  Abdominal:     Palpations:  Abdomen is soft.     Tenderness: There is abdominal tenderness. There is no guarding or rebound.     Comments: Mild diffuse abdominal tenderness, no guarding or rebound, soft  Musculoskeletal: Normal range of motion.        General: No tenderness.  Skin:    General: Skin is warm.  Neurological:     Mental Status: He is alert and oriented to person, place, and time.     Cranial Nerves: No cranial nerve deficit.     Motor: No abnormal muscle tone.     Coordination: Coordination normal.     Comments: No ataxia on finger to nose bilaterally. No pronator drift. 5/5 strength throughout. CN 2-12 intact.Equal grip strength. Sensation intact.  Romberg is positive, unable to stand unassisted.  Complains of abdominal cramping when he gets up. No appreciable nystagmus.  Psychiatric:        Behavior: Behavior normal.      ED Treatments / Results  Labs (all labs ordered are listed, but only abnormal results are displayed) Labs Reviewed  COMPREHENSIVE METABOLIC PANEL - Abnormal; Notable for the following components:      Result Value   Glucose, Bld 106 (*)    Calcium 8.3 (*)    Albumin 3.1 (*)    AST 47 (*)    All other components within normal limits  URINALYSIS, ROUTINE W REFLEX MICROSCOPIC - Abnormal; Notable for the following components:   Hgb urine dipstick SMALL (*)    All other components within normal limits  APTT - Abnormal; Notable for the following components:   aPTT 23 (*)    All other components within normal limits  CBG MONITORING, ED - Abnormal; Notable for the following components:   Glucose-Capillary 103 (*)    All other components within normal limits  I-STAT CHEM 8, ED - Abnormal; Notable for the following components:   Glucose, Bld 104 (*)    Calcium, Ion 1.14 (*)    All other components within normal limits  SARS  CORONAVIRUS 2 (TAT 6-24 HRS)  LIPASE, BLOOD  ETHANOL  PROTIME-INR  RAPID URINE DRUG SCREEN, HOSP PERFORMED  CBC WITH DIFFERENTIAL/PLATELET  CBC WITH  DIFFERENTIAL/PLATELET  TROPONIN I (HIGH SENSITIVITY)  TROPONIN I (HIGH SENSITIVITY)    EKG EKG Interpretation  Date/Time:  Friday April 19 2019 08:19:00 EST Ventricular Rate:  75 PR Interval:    QRS Duration: 80 QT Interval:  360 QTC Calculation: 402 R Axis:   33 Text Interpretation: Sinus rhythm Borderline T abnormalities, diffuse leads Nonspecific T wave abnormality Confirmed by Ezequiel Essex [id number]) on 04/19/2019 8:37:52 AM   Radiology Ct Angio Head W Or Wo Contrast  Result Date: 04/19/2019 CLINICAL DATA:  Dizziness. Body aches. EXAM: CT ANGIOGRAPHY HEAD AND NECK TECHNIQUE: Multidetector CT imaging of the head and neck was performed using the standard protocol during bolus administration of intravenous contrast. Multiplanar CT image reconstructions and MIPs were obtained to evaluate the vascular anatomy. Carotid stenosis measurements (when applicable) are obtained utilizing NASCET criteria, using the distal internal carotid diameter as the denominator. CONTRAST:  75 mL Omnipaque 350 COMPARISON:  None. FINDINGS: CT HEAD FINDINGS Brain: There is no evidence of acute infarct, intracranial hemorrhage, mass, midline shift, or extra-axial fluid collection. The ventricles and sulci are within normal limits for age. Vascular: Calcified atherosclerosis at the skull base. Skull: No fracture or focal osseous lesion. Sinuses: Mild mucosal thickening in the paranasal sinuses. Clear mastoid air cells. Orbits: Bilateral cataract extraction. Review of the MIP images confirms the above findings CTA NECK FINDINGS Aortic arch: Normal variant aortic arch branching pattern with common origin of the brachiocephalic and left common carotid arteries. Mild arch atherosclerosis without arch vessel origin stenosis. Right carotid system: Patent with minimal calcified plaque at the carotid bifurcation. No evidence of dissection or stenosis. Tortuous distal cervical ICA. Left carotid system: Patent without evidence  of dissection or stenosis. Tortuous distal cervical ICA. Vertebral arteries: Patent with the left being strongly dominant. No evidence of dissection or stenosis. Skeleton: Advanced cervical disc degeneration with mild multilevel listhesis. Other neck: No evidence of cervical lymphadenopathy or mass. Upper chest: Mild centrilobular emphysema. Review of the MIP images confirms the above findings CTA HEAD FINDINGS Anterior circulation: The internal carotid arteries are patent from skull base to carotid termini with minimal nonstenotic atherosclerosis. ACAs and MCAs are patent with mild branch vessel irregularity but no evidence of proximal branch occlusion or significant proximal stenosis. No aneurysm is identified. Posterior circulation: The intracranial vertebral arteries are widely patent to the basilar. Patent PICA, AICA, and SCA origins are seen bilaterally. The basilar artery is widely patent. There are left larger than right posterior communicating arteries. The PCAs are patent with mild irregularity but no significant proximal stenosis. No aneurysm is identified. Venous sinuses: As permitted by contrast timing, patent. Anatomic variants: None. Review of the MIP images confirms the above findings IMPRESSION: 1. Unremarkable CT appearance of the brain. No evidence of acute intracranial abnormality. 2. Minimal atherosclerosis in the head and neck without large vessel occlusion or significant stenosis. 3.  Aortic Atherosclerosis (ICD10-I70.0). Electronically Signed   By: Logan Bores M.D.   On: 04/19/2019 12:13   Ct Angio Neck W And/or Wo Contrast  Result Date: 04/19/2019 CLINICAL DATA:  Dizziness. Body aches. EXAM: CT ANGIOGRAPHY HEAD AND NECK TECHNIQUE: Multidetector CT imaging of the head and neck was performed using the standard protocol during bolus administration of intravenous contrast. Multiplanar CT image reconstructions and MIPs were obtained to evaluate the vascular anatomy. Carotid stenosis  measurements (  when applicable) are obtained utilizing NASCET criteria, using the distal internal carotid diameter as the denominator. CONTRAST:  75 mL Omnipaque 350 COMPARISON:  None. FINDINGS: CT HEAD FINDINGS Brain: There is no evidence of acute infarct, intracranial hemorrhage, mass, midline shift, or extra-axial fluid collection. The ventricles and sulci are within normal limits for age. Vascular: Calcified atherosclerosis at the skull base. Skull: No fracture or focal osseous lesion. Sinuses: Mild mucosal thickening in the paranasal sinuses. Clear mastoid air cells. Orbits: Bilateral cataract extraction. Review of the MIP images confirms the above findings CTA NECK FINDINGS Aortic arch: Normal variant aortic arch branching pattern with common origin of the brachiocephalic and left common carotid arteries. Mild arch atherosclerosis without arch vessel origin stenosis. Right carotid system: Patent with minimal calcified plaque at the carotid bifurcation. No evidence of dissection or stenosis. Tortuous distal cervical ICA. Left carotid system: Patent without evidence of dissection or stenosis. Tortuous distal cervical ICA. Vertebral arteries: Patent with the left being strongly dominant. No evidence of dissection or stenosis. Skeleton: Advanced cervical disc degeneration with mild multilevel listhesis. Other neck: No evidence of cervical lymphadenopathy or mass. Upper chest: Mild centrilobular emphysema. Review of the MIP images confirms the above findings CTA HEAD FINDINGS Anterior circulation: The internal carotid arteries are patent from skull base to carotid termini with minimal nonstenotic atherosclerosis. ACAs and MCAs are patent with mild branch vessel irregularity but no evidence of proximal branch occlusion or significant proximal stenosis. No aneurysm is identified. Posterior circulation: The intracranial vertebral arteries are widely patent to the basilar. Patent PICA, AICA, and SCA origins are seen  bilaterally. The basilar artery is widely patent. There are left larger than right posterior communicating arteries. The PCAs are patent with mild irregularity but no significant proximal stenosis. No aneurysm is identified. Venous sinuses: As permitted by contrast timing, patent. Anatomic variants: None. Review of the MIP images confirms the above findings IMPRESSION: 1. Unremarkable CT appearance of the brain. No evidence of acute intracranial abnormality. 2. Minimal atherosclerosis in the head and neck without large vessel occlusion or significant stenosis. 3.  Aortic Atherosclerosis (ICD10-I70.0). Electronically Signed   By: Logan Bores M.D.   On: 04/19/2019 12:13   Ct Abdomen Pelvis W Contrast  Result Date: 04/19/2019 CLINICAL DATA:  Dizziness body aches. EXAM: CT ABDOMEN AND PELVIS WITH CONTRAST TECHNIQUE: Multidetector CT imaging of the abdomen and pelvis was performed using the standard protocol following bolus administration of intravenous contrast. CONTRAST:  145m OMNIPAQUE IOHEXOL 300 MG/ML  SOLN COMPARISON:  07/28/2017 and 06/28/2013 FINDINGS: Lower chest: Subtle ground-glass and interstitial thickening noted at the right lung base not present in 2019. No signs of effusion or basilar consolidation. Hepatobiliary: Caudate cyst. No suspicious focal lesion in the liver. Pancreas: Unremarkable. No pancreatic ductal dilatation or surrounding inflammatory changes. Spleen: Normal in size without focal abnormality. Adrenals/Urinary Tract: Normal appearance of bilateral adrenal glands. Symmetric enhancement of the bilateral kidneys. Small low-density renal lesions likely cysts. No signs of hydronephrosis. Symmetric renal enhancement. Stomach/Bowel: No signs of bowel obstruction or acute bowel process. Post subtotal colectomy with ileal rectal anastomosis. Signs of mural stratification of the rectum with perirectal lymph nodes and high superior rectal lymph nodes unchanged from previous exam. No signs of  bowel obstruction. Vascular/Lymphatic: Vascular structures are patent. There is atherosclerotic change within the abdominal aorta tracking in the iliac vessels. No signs of abdominal or pelvic lymphadenopathy aside from mildly enlarged but stable nodes in the rectal mesentery and low abdomen. Reproductive: Prostate heterogeneity, nonspecific  by CT. Urinary bladder under distended but otherwise unremarkable. Other: Moderate fat containing right inguinal hernia. No signs of free air. No signs of ascites. Musculoskeletal: Extensive spinal degenerative changes without acute or destructive bone process. IMPRESSION: 1. Signs of mural stratification of the rectum with perirectal lymph nodes and high superior rectal lymph nodes are unchanged from previous exam. Compatible with proctitis. 2. Ground-glass and interstitial thickening at the right lung base may represent pneumonitis. Consider follow-up to ensure resolution at 3 month interval, signs of mild parenchymal distortion and subpleural reticulation also raising the question of early interstitial lung disease. This is new compared to the study of 07/28/2017. 3. Atherosclerotic change in the abdominal aorta and iliac vessels. Aortic Atherosclerosis (ICD10-I70.0). Electronically Signed   By: Zetta Bills M.D.   On: 04/19/2019 11:55    Procedures Procedures (including critical care time)  Medications Ordered in ED Medications - No data to display   Initial Impression / Assessment and Plan / ED Course  I have reviewed the triage vital signs and the nursing notes.  Pertinent labs & imaging results that were available during my care of the patient were reviewed by me and considered in my medical decision making (see chart for details).       Patient with diarrhea and abdominal cramping since last night now with worsening dizziness, lightheadedness and near syncope.  Patient is not a TPA candidate due to delay in presentation.  He may be a candidate for  intervention if he has a large vessel occlusion.  Discussed with Dr. Malen Gauze of neurology who recommends MRI and MRA.  Orthostatics are negative.  Labs show normal kidney function so we will proceed with CTA of head and neck to evaluate for his vertigo.  CTA is negative for large vessel occlusion.  Labs are reassuring with troponin negative x2. MRI pending to evaluate for stroke.  CT abdomen results show evidence of proctocolitis similar to previous episode.  Patient is given IV Flagyl and Cipro.  Ground glass opacities in lungs will need 3 month followup. No current cough or fever. Will send COVID.  D/w PA-C Trixie Deis gastroenterology.  She agrees with outpatient follow-up and will prescribe antibiotics but no steroids at this time.  MRI still pending at shift change.  Anticipate discharge home if reassuring.  Patient prescribed antibiotics for his proctitis and given GI follow-up. He understands he needs a repeat scan of his lungs in 3 months to assess the groundglass changes seen.  Covid swab is pending. Dr. Regenia Skeeter to assume care at shift change.   Final Clinical Impressions(s) / ED Diagnoses   Final diagnoses:  Vertigo  Diarrhea, unspecified type    ED Discharge Orders    None       Cem Kosman, Annie Main, MD 04/19/19 1552

## 2019-04-19 NOTE — ED Notes (Signed)
Irregular heart rate is normal for patient

## 2019-04-19 NOTE — Discharge Instructions (Addendum)
Keep yourself hydrated.  Take the antibiotics as prescribed for inflammation of your colon follow-up with your stomach doctor.  You should have a repeat CT scan of your lungs in 3 months to ensure that the changes seen on the scan today are resolving.  Return to the ED if you develop new or worsening symptoms.  We discussed that by not getting an MRI today we cannot fully rule out stroke.  If you develop any new or worsening symptoms such as dizziness, blurry vision or double vision, or feeling off balance/trouble walking, you need to return to the ER or call 911 immediately.

## 2019-04-19 NOTE — ED Provider Notes (Signed)
Patient is asking to leave.  He states he cannot do an MRI.  He has been put to sleep for these before but never can take antianxiety medicines as it never works.  He is adamantly against MRI.  He has been walking back and forth to the bathroom with no trouble.  My suspicion of this being a cerebellar stroke is lower.  I discussed however that we cannot fully rule this out without the MRI itself.  He seems understand this and still wants to go home.  Likely his dizziness was from his diarrhea and ulcerative colitis problems.  Return precautions.   Sherwood Gambler, MD 04/19/19 [phone]

## 2019-04-19 NOTE — ED Notes (Signed)
Patient transported to CT 

## 2019-04-19 NOTE — ED Notes (Signed)
ED Provider at bedside. 

## 2019-04-19 NOTE — ED Notes (Signed)
PT HAS S;POKEN TO THE DOTOR

## 2019-04-19 NOTE — ED Notes (Signed)
Patient come back from CT

## 2019-04-20 ENCOUNTER — Other Ambulatory Visit: Payer: Self-pay

## 2019-04-20 ENCOUNTER — Inpatient Hospital Stay (HOSPITAL_COMMUNITY)
Admission: EM | Admit: 2019-04-20 | Discharge: 2019-04-25 | DRG: 177 | Disposition: A | Discharge status: Home / Self Care | Payer: Medicare HMO | Attending: Internal Medicine | Admitting: Internal Medicine

## 2019-04-20 ENCOUNTER — Inpatient Hospital Stay (HOSPITAL_COMMUNITY): Payer: Medicare HMO

## 2019-04-20 ENCOUNTER — Emergency Department (HOSPITAL_COMMUNITY): Payer: Medicare HMO

## 2019-04-20 ENCOUNTER — Encounter (HOSPITAL_COMMUNITY): Payer: Self-pay | Admitting: Emergency Medicine

## 2019-04-20 DIAGNOSIS — R197 Diarrhea, unspecified: Secondary | ICD-10-CM | POA: Diagnosis not present

## 2019-04-20 DIAGNOSIS — I951 Orthostatic hypotension: Secondary | ICD-10-CM | POA: Diagnosis not present

## 2019-04-20 DIAGNOSIS — Z888 Allergy status to other drugs, medicaments and biological substances status: Secondary | ICD-10-CM

## 2019-04-20 DIAGNOSIS — E860 Dehydration: Secondary | ICD-10-CM | POA: Diagnosis present

## 2019-04-20 DIAGNOSIS — K5190 Ulcerative colitis, unspecified, without complications: Secondary | ICD-10-CM | POA: Diagnosis present

## 2019-04-20 DIAGNOSIS — I10 Essential (primary) hypertension: Secondary | ICD-10-CM | POA: Diagnosis present

## 2019-04-20 DIAGNOSIS — Z87891 Personal history of nicotine dependence: Secondary | ICD-10-CM

## 2019-04-20 DIAGNOSIS — Z6830 Body mass index (BMI) 30.0-30.9, adult: Secondary | ICD-10-CM

## 2019-04-20 DIAGNOSIS — E669 Obesity, unspecified: Secondary | ICD-10-CM | POA: Diagnosis present

## 2019-04-20 DIAGNOSIS — K219 Gastro-esophageal reflux disease without esophagitis: Secondary | ICD-10-CM | POA: Diagnosis present

## 2019-04-20 DIAGNOSIS — E871 Hypo-osmolality and hyponatremia: Secondary | ICD-10-CM | POA: Diagnosis present

## 2019-04-20 DIAGNOSIS — J1282 Pneumonia due to coronavirus disease 2019: Secondary | ICD-10-CM

## 2019-04-20 DIAGNOSIS — A0839 Other viral enteritis: Secondary | ICD-10-CM | POA: Diagnosis present

## 2019-04-20 DIAGNOSIS — F3162 Bipolar disorder, current episode mixed, moderate: Secondary | ICD-10-CM | POA: Diagnosis present

## 2019-04-20 DIAGNOSIS — N179 Acute kidney failure, unspecified: Secondary | ICD-10-CM | POA: Diagnosis present

## 2019-04-20 DIAGNOSIS — E876 Hypokalemia: Secondary | ICD-10-CM | POA: Diagnosis present

## 2019-04-20 DIAGNOSIS — Z85038 Personal history of other malignant neoplasm of large intestine: Secondary | ICD-10-CM | POA: Diagnosis not present

## 2019-04-20 DIAGNOSIS — M170 Bilateral primary osteoarthritis of knee: Secondary | ICD-10-CM | POA: Diagnosis present

## 2019-04-20 DIAGNOSIS — Z79899 Other long term (current) drug therapy: Secondary | ICD-10-CM

## 2019-04-20 DIAGNOSIS — Z980 Intestinal bypass and anastomosis status: Secondary | ICD-10-CM

## 2019-04-20 DIAGNOSIS — M810 Age-related osteoporosis without current pathological fracture: Secondary | ICD-10-CM | POA: Diagnosis present

## 2019-04-20 DIAGNOSIS — Z8249 Family history of ischemic heart disease and other diseases of the circulatory system: Secondary | ICD-10-CM

## 2019-04-20 DIAGNOSIS — E861 Hypovolemia: Secondary | ICD-10-CM | POA: Diagnosis present

## 2019-04-20 DIAGNOSIS — Z9049 Acquired absence of other specified parts of digestive tract: Secondary | ICD-10-CM | POA: Diagnosis not present

## 2019-04-20 DIAGNOSIS — Z833 Family history of diabetes mellitus: Secondary | ICD-10-CM

## 2019-04-20 DIAGNOSIS — R42 Dizziness and giddiness: Secondary | ICD-10-CM | POA: Diagnosis present

## 2019-04-20 DIAGNOSIS — E872 Acidosis: Secondary | ICD-10-CM | POA: Diagnosis present

## 2019-04-20 DIAGNOSIS — L89152 Pressure ulcer of sacral region, stage 2: Secondary | ICD-10-CM | POA: Diagnosis present

## 2019-04-20 DIAGNOSIS — Z8719 Personal history of other diseases of the digestive system: Secondary | ICD-10-CM | POA: Diagnosis not present

## 2019-04-20 DIAGNOSIS — J1289 Other viral pneumonia: Secondary | ICD-10-CM | POA: Diagnosis present

## 2019-04-20 DIAGNOSIS — U071 COVID-19: Secondary | ICD-10-CM | POA: Diagnosis present

## 2019-04-20 DIAGNOSIS — Z886 Allergy status to analgesic agent status: Secondary | ICD-10-CM

## 2019-04-20 DIAGNOSIS — R918 Other nonspecific abnormal finding of lung field: Secondary | ICD-10-CM | POA: Diagnosis not present

## 2019-04-20 HISTORY — DX: COVID-19: U07.1

## 2019-04-20 LAB — FERRITIN: Ferritin: 109 ng/mL (ref 24–336)

## 2019-04-20 LAB — CBC WITH DIFFERENTIAL/PLATELET
Abs Immature Granulocytes: 0.01 10*3/uL (ref 0.00–0.07)
Basophils Absolute: 0 10*3/uL (ref 0.0–0.1)
Basophils Relative: 0 %
Eosinophils Absolute: 0 10*3/uL (ref 0.0–0.5)
Eosinophils Relative: 0 %
HCT: 45.4 % (ref 39.0–52.0)
Hemoglobin: 14.9 g/dL (ref 13.0–17.0)
Immature Granulocytes: 0 %
Lymphocytes Relative: 34 %
Lymphs Abs: 1.8 10*3/uL (ref 0.7–4.0)
MCH: 27.5 pg (ref 26.0–34.0)
MCHC: 32.8 g/dL (ref 30.0–36.0)
MCV: 83.9 fL (ref 80.0–100.0)
Monocytes Absolute: 0.5 10*3/uL (ref 0.1–1.0)
Monocytes Relative: 9 %
Neutro Abs: 3 10*3/uL (ref 1.7–7.7)
Neutrophils Relative %: 57 %
Platelets: 372 10*3/uL (ref 150–400)
RBC: 5.41 MIL/uL (ref 4.22–5.81)
RDW: 14.1 % (ref 11.5–15.5)
WBC: 5.3 10*3/uL (ref 4.0–10.5)
nRBC: 0 % (ref 0.0–0.2)

## 2019-04-20 LAB — COMPREHENSIVE METABOLIC PANEL
ALT: 51 U/L — ABNORMAL HIGH (ref 0–44)
AST: 71 U/L — ABNORMAL HIGH (ref 15–41)
Albumin: 3.8 g/dL (ref 3.5–5.0)
Alkaline Phosphatase: 86 U/L (ref 38–126)
Anion gap: 15 (ref 5–15)
BUN: 11 mg/dL (ref 8–23)
CO2: 21 mmol/L — ABNORMAL LOW (ref 22–32)
Calcium: 8.6 mg/dL — ABNORMAL LOW (ref 8.9–10.3)
Chloride: 93 mmol/L — ABNORMAL LOW (ref 98–111)
Creatinine, Ser: 1.21 mg/dL (ref 0.61–1.24)
GFR calc Af Amer: >60 mL/min (ref >60)
GFR calc non Af Amer: 58 mL/min — ABNORMAL LOW (ref >60)
Glucose, Bld: 99 mg/dL (ref 70–99)
Potassium: 3.5 mmol/L (ref 3.5–5.1)
Sodium: 129 mmol/L — ABNORMAL LOW (ref 135–145)
Total Bilirubin: 1.1 mg/dL (ref 0.3–1.2)
Total Protein: 8.7 g/dL — ABNORMAL HIGH (ref 6.5–8.1)

## 2019-04-20 LAB — LACTATE DEHYDROGENASE: LDH: 214 U/L — ABNORMAL HIGH (ref 98–192)

## 2019-04-20 LAB — MAGNESIUM: Magnesium: 2.2 mg/dL (ref 1.7–2.4)

## 2019-04-20 LAB — D-DIMER, QUANTITATIVE: D-Dimer, Quant: 0.62 ug/mL-FEU — ABNORMAL HIGH (ref 0.00–0.50)

## 2019-04-20 LAB — C-REACTIVE PROTEIN: CRP: 3.3 mg/dL — ABNORMAL HIGH (ref <1.0)

## 2019-04-20 LAB — PHOSPHORUS: Phosphorus: 2.7 mg/dL (ref 2.5–4.6)

## 2019-04-20 MED ORDER — CIPROFLOXACIN IN D5W 400 MG/200ML IV SOLN
400.0000 mg | Freq: Once | INTRAVENOUS | Status: DC
  Filled 2019-04-20: qty 200

## 2019-04-20 MED ORDER — FAMOTIDINE 20 MG PO TABS
20.0000 mg | ORAL_TABLET | Freq: Two times a day (BID) | ORAL | Status: DC
  Administered 2019-04-20 – 2019-04-25 (×10): 20 mg via ORAL
  Filled 2019-04-20 (×10): qty 1

## 2019-04-20 MED ORDER — METRONIDAZOLE IN NACL 5-0.79 MG/ML-% IV SOLN
500.0000 mg | Freq: Once | INTRAVENOUS | Status: DC
  Filled 2019-04-20: qty 100

## 2019-04-20 MED ORDER — LACTATED RINGERS IV BOLUS
1000.0000 mL | Freq: Once | INTRAVENOUS | Status: AC
  Administered 2019-04-20: 1000 mL via INTRAVENOUS

## 2019-04-20 MED ORDER — PIPERACILLIN-TAZOBACTAM 3.375 G IVPB
3.3750 g | Freq: Three times a day (TID) | INTRAVENOUS | Status: DC
  Administered 2019-04-20 – 2019-04-21 (×4): 3.375 g via INTRAVENOUS
  Filled 2019-04-20 (×4): qty 50

## 2019-04-20 MED ORDER — SODIUM CHLORIDE 0.9 % IV SOLN
INTRAVENOUS | Status: DC
  Administered 2019-04-20 – 2019-04-21 (×2): via INTRAVENOUS

## 2019-04-20 MED ORDER — NAPROXEN 250 MG PO TABS
500.0000 mg | ORAL_TABLET | Freq: Two times a day (BID) | ORAL | Status: DC | PRN
  Filled 2019-04-20: qty 2

## 2019-04-20 MED ORDER — ENOXAPARIN SODIUM 40 MG/0.4ML ~~LOC~~ SOLN
40.0000 mg | SUBCUTANEOUS | Status: DC
  Administered 2019-04-20 – 2019-04-25 (×6): 40 mg via SUBCUTANEOUS
  Filled 2019-04-20 (×6): qty 0.4

## 2019-04-20 MED ORDER — MESALAMINE 1000 MG RE SUPP
1000.0000 mg | Freq: Every day | RECTAL | Status: DC
  Administered 2019-04-20 – 2019-04-24 (×5): 1000 mg via RECTAL
  Filled 2019-04-20 (×6): qty 1

## 2019-04-20 MED ORDER — HYDROCODONE-ACETAMINOPHEN 5-325 MG PO TABS
1.0000 | ORAL_TABLET | Freq: Once | ORAL | Status: AC
  Administered 2019-04-20: 1 via ORAL
  Filled 2019-04-20: qty 1

## 2019-04-20 NOTE — H&P (Signed)
History and Physical  Robert Rhodes PTW:[id number] DOB: 1946-03-01 DOA: 04/20/2019  PCP: [name], MD   Chief Complaint: Diarrhea  HPI:  31yrblk m colon cancer + right-sided ileorectal anastomosis, ventral hernia repair 2015, ulcerative colitis, prior psychosis 2016, HTN, reflux Came to ED 11/6 worked up for dizziness found to be coronavirus positive-work-up inclusive of CTs etc. 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 Coronavirus +11/6  ED Course: Given Norco, bolus 1000 cc IV fluid support Flagyl was discontinued by me in favor 3 times a day medication Zosyn No oxygen requirement  Review of Systems:  No fever no hypoxia no shortness of breath no chest pain No dark stool no tarry stool no vomiting Past Medical History:  Diagnosis Date  . Adenomatous colon polyp   . Arthritis   . Claustrophobia   . [place]  . COVID-19   . Eye abnormalities    right eye seen in ED 02/17/2014 wearing eye patch using eye drops  . GERD (gastroesophageal reflux disease)   . Hypertension   . Irregular heartbeat    per pt/doesn't know what it does  . Osteoarthritis    bilat knees  needs replacement  . Osteoporosis    pt denies  . Substance abuse [place]    stopped over 3 years ago  . [place]   . Vitamin D deficiency     Past Surgical History:  Procedure Laterality Date  . COLON RESECTION N/A 08/23/2013   Procedure: Laparoscopic total abdominal colectomy and hernia repair;   Surgeon: ALeighton Ruff MD;  Location: [facility];  Service: General;  Laterality: N/A;  . COLON SURGERY  08/2013  . COLONOSCOPY    . EUS N/A 07/18/2013   Procedure: UPPER ENDOSCOPIC ULTRASOUND (EUS) RADIAL;  Surgeon: DMilus Banister MD;  Location: [facility];  Service: Endoscopy;  Laterality: N/A;  . EYE SURGERY    . HERNIA REPAIR  2005   In [place]/put in a mesh  . INCISIONAL HERNIA REPAIR N/A 02/20/2014   Procedure: LAP ASSISTED INCISIONAL HERNIA REPAIR LYSIS OF ADHESIONS;  Surgeon: ALeighton Ruff MD;  Location: [facility];  Service: General;  Laterality: N/A;  converted to open @ 0935  . INCISIONAL HERNIA REPAIR N/A 02/20/2014   Procedure: HERNIA REPAIR INCISIONAL;  Surgeon: ALeighton Ruff MD;  Location: [facility];  Service: General;  Laterality: N/A;  With MESH  . INTRAOCULAR LENS INSERTION Bilateral    6 yrs ago  . VENTRAL HERNIA REPAIR       reports that he quit smoking about 15 years ago. His smoking use included cigarettes. He smoked 0.00 packs per day. He has never used smokeless tobacco. He reports previous drug use. Drug: Marijuana. He reports that he does not drink alcohol. Mobility: Independent  Allergies  Allergen Reactions  . Ace Inhibitors Other (See Comments)    Unknown reaction  . Advil [Ibuprofen] Other (See Comments)    Muscle tightness  . Aspirin Nausea And Vomiting  . Tylenol [Acetaminophen] Other (See Comments)    Muscle tightness  Family History  Problem Relation Age of Onset  . Cancer Mother        type unknown  . Diabetes Father   . Heart disease Father   . Heart disease Sister   . Heart attack Brother   . Healthy Sister   . Colon cancer Neg Hx   . Esophageal cancer Neg Hx   . Rectal cancer Neg Hx   . Stomach cancer Neg Hx      Prior to Admission medications   Medication Sig Start Date End Date Taking? Authorizing Provider  ciprofloxacin (CIPRO) 500 MG tablet Take 1 tablet (500 mg total) by mouth 2 (two) times daily. 04/19/19  Yes Rancour, Annie Main, MD   hydrochlorothiazide (HYDRODIURIL) 25 MG tablet Take 1 tablet (25 mg total) by mouth daily. 07/10/18  Yes [name], MD  mesalamine (CANASA) 1000 MG suppository Place 1 suppository (1,000 mg total) rectally at bedtime. 09/06/18  Yes Pyrtle, Lajuan Lines, MD  metroNIDAZOLE (FLAGYL) 500 MG tablet Take 1 tablet (500 mg total) by mouth 2 (two) times daily. 04/19/19  Yes Rancour, Annie Main, MD  Multiple Vitamin (MULTIVITAMIN WITH MINERALS) TABS tablet Take 1 tablet by mouth daily at 12 noon. Patient taking differently: Take 1 tablet by mouth at bedtime.  08/26/14  Yes Ghimire, Henreitta Leber, MD  Naproxen Sodium (ALEVE) 220 MG CAPS Take 440 mg by mouth 2 (two) times daily as needed (arthritis pain).   Yes [provider]  Omega 3 1200 MG CAPS Take 1,200 mg by mouth daily.    Yes [provider]  cetirizine (ZYRTEC) 10 MG tablet Take 1 tablet (10 mg total) by mouth daily. Patient not taking: Reported on 04/20/2019 07/10/18   [name], MD  Misc. Devices MISC Blood pressure machine.  Diagnosis-hypertension 07/13/18   [name], MD  naproxen (NAPROSYN) 500 MG tablet Take 1 tablet (500 mg total) by mouth 2 (two) times daily with a meal. Patient not taking: Reported on 07/10/2018 02/07/18   [name], MD  pantoprazole (PROTONIX) 40 MG tablet Take 1 tablet (40 mg total) by mouth daily. Patient not taking: Reported on 04/20/2019 08/03/18   Jerene Bears, MD    Physical Exam: Vitals:   04/20/19 1500 04/20/19 1615  BP: 114/77 139/78  Pulse: 80 93  Resp:  16  Temp:    SpO2: 96% 96%   Awake coherent pleasant African-American male no distress noted on oxygen EOMI NCAT moving all 4 limbs equally S1-S2 no murmur rub or gallop chest clear no added sound no rales no rhonchi abdomen soft nontender no rebound no guarding moving all 4 limbs equally he has boots and shoes on I did not examine his feet euthymic and congruent   I have personally reviewed following labs and imaging studies  Labs:   Coronavirus specific labs not performed but ordered BUN/creatinine elevated slightly above his baseline of 8/1.01 211/1.2 with a mild metabolic acidosis of 21 Sodium was 129 AST ALT elevated 7151 > 47/34  Imaging studies:   none   Medical tests:   EKG independently reviewed: none    Active Problems:   Diarrhea   Assessment/Plan Possible ulcerative colitis CT scan performed 11/6 showed proctitis-I feel this very well could represent ulcerative colitis so it is reasonable to use Zosyn although Covid can present the same way I have ordered Zosyn 3 times daily instead of Cipro Flagyl which are multi dose regimens  If slows diarrhea this can empirically be stopped after couple of days or transition  to Augmentin for treatment.  Above may be 3 to 5 days Can continue [facility] suppositories GI was consulted when he came to emergency room 11/6 and recommended only antibiotics do not think they need to be involved at this juncture Coronavirus positive Would get CRP ferritin dimer now Would check oxygen saturations carefully and get a.m. CXR ensure --- not hypoxic at this time Do not think currently would benefit from steroids or remdesivir but if there is clinical worsening very reasonable to use Mild AKI plus metabolic acidosis probably from loss of GI hydration Hyponatremia hypovolemic 1000 cc bolus given, continue rate 75 cc/H, repeat labs in a.m., expect both will resolve Holding at this time home Naprosyn as well as HCTZ Dizziness Orthostatics are negative Had a work-up including CT and CTA of head No focal deficits on my exam so holding MRIs patient was refusing this in any case Monitor trends Prior psychosis Not currently on any meds does use marijuana however Reflux Continue Protonix   Severity of Illness: The appropriate patient status for this patient is INPATIENT. Inpatient status is judged to be reasonable and necessary in order to provide the required intensity of service  to ensure the patient's safety. The patient's presenting symptoms, physical exam findings, and initial radiographic and laboratory data in the context of their chronic comorbidities is felt to place them at high risk for further clinical deterioration. Furthermore, it is not anticipated that the patient will be medically stable for discharge from the hospital within 2 midnights of admission. The following factors support the patient status of inpatient.   " The patient's presenting symptoms include coronavirus. " The worrisome physical exam findings include some abdominal discomfort. " The initial radiographic and laboratory data are worrisome because of volume depletion Covid. " The chronic co-morbidities include advanced age.   * I certify that at the point of admission it is my clinical judgment that the patient will require inpatient hospital care spanning beyond 2 midnights from the point of admission due to high intensity of service, high risk for further deterioration and high frequency of surveillance required.*     DVT prophylaxis: Lovenox Code Status: Full confirmed Family Communication: called Robert Rhodes [phone] brother and updated Consults called: None  Time spent: 47 minutes  Verneita Griffes, MD [facility] 5:47 PM  04/20/2019, 5:47 PM

## 2019-04-20 NOTE — ED Notes (Signed)
Robert Rhodes call when pt is D/C or [phone]

## 2019-04-20 NOTE — ED Notes (Signed)
ED TO INPATIENT HANDOFF REPORT  ED Nurse Name and Phone #: Lovell Sheehan [phone]  S Name/Age/Gender Robert Rhodes 73 y.o. male Room/Bed: 023C/023C  Code Status   Code Status: Full Code  Home/SNF/Other Home Patient oriented to: self, place, time and situation Is this baseline? Yes   Triage Complete: Triage complete  Chief Complaint covid positive  Triage Note Pt states he was diagnosed with COVID yesterday.  C/o diarrhea.   Allergies Allergies  Allergen Reactions  . Ace Inhibitors Other (See Comments)    Unknown reaction  . Advil [Ibuprofen] Other (See Comments)    Muscle tightness  . Aspirin Nausea And Vomiting  . Tylenol [Acetaminophen] Other (See Comments)    Muscle tightness    Level of Care/Admitting Diagnosis ED Disposition    ED Disposition Condition [facility] Area: [place] [[id number]]  Level of Care: Telemetry [5]  Covid Evaluation: Confirmed COVID Positive  Diagnosis: Diarrhea [787.91.ICD-9-CM]  Admitting Physician: Nita Sells [phone]  Attending Physician: Nita Sells [phone]  Estimated length of stay: 3 - 4 days  Certification:: I certify this patient will need inpatient services for at least 2 midnights  PT Class (Do Not Modify): Inpatient [101]  PT Acc Code (Do Not Modify): Private [1]       B Medical/Surgery History Past Medical History:  Diagnosis Date  . Adenomatous colon polyp   . Arthritis   . Claustrophobia   . Colon cancer ([place]) 2015  . COVID-19   . Eye abnormalities    right eye seen in ED 02/17/2014 wearing eye patch using eye drops  . GERD (gastroesophageal reflux disease)   . Hypertension   . Irregular heartbeat    per pt/doesn't know what it does  . Osteoarthritis    bilat knees  needs replacement  . Osteoporosis    pt denies  . Substance abuse ([place])    stopped over 3 years ago  . UC (ulcerative colitis) ([place])   . Vitamin D deficiency    Past Surgical History:   Procedure Laterality Date  . COLON RESECTION N/A 08/23/2013   Procedure: Laparoscopic total abdominal colectomy and hernia repair;  Surgeon: Leighton Ruff, MD;  Location: [facility];  Service: General;  Laterality: N/A;  . COLON SURGERY  08/2013  . COLONOSCOPY    . EUS N/A 07/18/2013   Procedure: UPPER ENDOSCOPIC ULTRASOUND (EUS) RADIAL;  Surgeon: Milus Banister, MD;  Location: [facility];  Service: Endoscopy;  Laterality: N/A;  . EYE SURGERY    . HERNIA REPAIR  2005   In [place]/put in a mesh  . INCISIONAL HERNIA REPAIR N/A 02/20/2014   Procedure: LAP ASSISTED INCISIONAL HERNIA REPAIR LYSIS OF ADHESIONS;  Surgeon: Leighton Ruff, MD;  Location: [facility];  Service: General;  Laterality: N/A;  converted to open @ 0935  . INCISIONAL HERNIA REPAIR N/A 02/20/2014   Procedure: HERNIA REPAIR INCISIONAL;  Surgeon: Leighton Ruff, MD;  Location: [facility];  Service: General;  Laterality: N/A;  With MESH  . INTRAOCULAR LENS INSERTION Bilateral    6 yrs ago  . VENTRAL HERNIA REPAIR       A IV Location/Drains/Wounds Patient Lines/Drains/Airways Status   Active Line/Drains/Airways    Name:   Placement date:   Placement time:   Site:   Days:   Peripheral IV 04/20/19 Right Antecubital   04/20/19    1547    Antecubital   less than 1   Incision (Closed) 02/20/14 Abdomen Other (Comment)  02/20/14    1130     1885   Incision - 5 Ports Abdomen Lower;Mid Right;Upper Right;Lower Left;Upper Left;Lower   02/20/14    0915     1885   Pressure Ulcer 08/23/14 Stage II -  Partial thickness loss of dermis presenting as a shallow open ulcer with a red, pink wound bed without slough.   08/23/14    2200     1701          Intake/Output Last 24 hours No intake or output data in the 24 hours ending 04/20/19 1856  Labs/Imaging Results for orders placed or performed during the hospital encounter of 04/20/19 (from the past 48 hour(s))  Magnesium     Status: None   Collection Time: 04/20/19  3:52 PM  Result Value Ref Range    Magnesium 2.2 1.7 - 2.4 mg/dL    Comment: Performed at [facility], [place] [address], [place], [place] [address]  Phosphorus     Status: None   Collection Time: 04/20/19  3:52 PM  Result Value Ref Range   Phosphorus 2.7 2.5 - 4.6 mg/dL    Comment: Performed at [facility] [address], [place], [place] [address]  Comprehensive metabolic panel     Status: Abnormal   Collection Time: 04/20/19  4:05 PM  Result Value Ref Range   Sodium 129 (L) 135 - 145 mmol/L    Comment: REPEATED TO VERIFY DELTA CHECK NOTED    Potassium 3.5 3.5 - 5.1 mmol/L   Chloride 93 (L) 98 - 111 mmol/L   CO2 21 (L) 22 - 32 mmol/L   Glucose, Bld 99 70 - 99 mg/dL   BUN 11 8 - 23 mg/dL   Creatinine, Ser 1.21 0.61 - 1.24 mg/dL   Calcium 8.6 (L) 8.9 - 10.3 mg/dL   Total Protein 8.7 (H) 6.5 - 8.1 g/dL   Albumin 3.8 3.5 - 5.0 g/dL   AST 71 (H) 15 - 41 U/L   ALT 51 (H) 0 - 44 U/L   Alkaline Phosphatase 86 38 - 126 U/L   Total Bilirubin 1.1 0.3 - 1.2 mg/dL   GFR calc non Af Amer 58 (L) >60 mL/min   GFR calc Af Amer >60 >60 mL/min   Anion gap 15 5 - 15    Comment: Performed at [facility], [place] [address] [place], [place] [address]  CBC with Differential     Status: None   Collection Time: 04/20/19  4:05 PM  Result Value Ref Range   WBC 5.3 4.0 - 10.5 K/uL   RBC 5.41 4.22 - 5.81 MIL/uL   Hemoglobin 14.9 13.0 - 17.0 g/dL   HCT 45.4 39.0 - 52.0 %   MCV 83.9 80.0 - 100.0 fL   MCH 27.5 26.0 - 34.0 pg   MCHC 32.8 30.0 - 36.0 g/dL   RDW 14.1 11.5 - 15.5 %   Platelets 372 150 - 400 K/uL   nRBC 0.0 0.0 - 0.2 %   Neutrophils Relative % 57 %   Neutro Abs 3.0 1.7 - 7.7 K/uL   Lymphocytes Relative 34 %   Lymphs Abs 1.8 0.7 - 4.0 K/uL   Monocytes Relative 9 %   Monocytes Absolute 0.5 0.1 - 1.0 K/uL   Eosinophils Relative 0 %   Eosinophils Absolute 0.0 0.0 - 0.5 K/uL   Basophils Relative 0 %   Basophils Absolute 0.0 0.0 - 0.1 K/uL   Immature Granulocytes 0 %  Abs Immature Granulocytes 0.01 0.00  - 0.07 K/uL    Comment: Performed at [facility], [place] [address] [place], [place] [address]   Ct Angio Head W Or Wo Contrast  Result Date: 04/19/2019 CLINICAL DATA:  Dizziness. Body aches. EXAM: CT ANGIOGRAPHY HEAD AND NECK TECHNIQUE: Multidetector CT imaging of the head and neck was performed using the standard protocol during bolus administration of intravenous contrast. Multiplanar CT image reconstructions and MIPs were obtained to evaluate the vascular anatomy. Carotid stenosis measurements (when applicable) are obtained utilizing NASCET criteria, using the distal internal carotid diameter as the denominator. CONTRAST:  75 mL Omnipaque 350 COMPARISON:  None. FINDINGS: CT HEAD FINDINGS Brain: There is no evidence of acute infarct, intracranial hemorrhage, mass, midline shift, or extra-axial fluid collection. The ventricles and sulci are within normal limits for age. Vascular: Calcified atherosclerosis at the skull base. Skull: No fracture or focal osseous lesion. Sinuses: Mild mucosal thickening in the paranasal sinuses. Clear mastoid air cells. Orbits: Bilateral cataract extraction. Review of the MIP images confirms the above findings CTA NECK FINDINGS Aortic arch: Normal variant aortic arch branching pattern with common origin of the brachiocephalic and left common carotid arteries. Mild arch atherosclerosis without arch vessel origin stenosis. Right carotid system: Patent with minimal calcified plaque at the carotid bifurcation. No evidence of dissection or stenosis. Tortuous distal cervical ICA. Left carotid system: Patent without evidence of dissection or stenosis. Tortuous distal cervical ICA. Vertebral arteries: Patent with the left being strongly dominant. No evidence of dissection or stenosis. Skeleton: Advanced cervical disc degeneration with mild multilevel listhesis. Other neck: No evidence of cervical lymphadenopathy or mass. Upper chest: Mild centrilobular emphysema. Review of the MIP  images confirms the above findings CTA HEAD FINDINGS Anterior circulation: The internal carotid arteries are patent from skull base to carotid termini with minimal nonstenotic atherosclerosis. ACAs and MCAs are patent with mild branch vessel irregularity but no evidence of proximal branch occlusion or significant proximal stenosis. No aneurysm is identified. Posterior circulation: The intracranial vertebral arteries are widely patent to the basilar. Patent PICA, AICA, and SCA origins are seen bilaterally. The basilar artery is widely patent. There are left larger than right posterior communicating arteries. The PCAs are patent with mild irregularity but no significant proximal stenosis. No aneurysm is identified. Venous sinuses: As permitted by contrast timing, patent. Anatomic variants: None. Review of the MIP images confirms the above findings IMPRESSION: 1. Unremarkable CT appearance of the brain. No evidence of acute intracranial abnormality. 2. Minimal atherosclerosis in the head and neck without large vessel occlusion or significant stenosis. 3.  Aortic Atherosclerosis (ICD10-I70.0). Electronically Signed   By: Logan Bores M.D.   On: 04/19/2019 12:13   Ct Angio Neck W And/or Wo Contrast  Result Date: 04/19/2019 CLINICAL DATA:  Dizziness. Body aches. EXAM: CT ANGIOGRAPHY HEAD AND NECK TECHNIQUE: Multidetector CT imaging of the head and neck was performed using the standard protocol during bolus administration of intravenous contrast. Multiplanar CT image reconstructions and MIPs were obtained to evaluate the vascular anatomy. Carotid stenosis measurements (when applicable) are obtained utilizing NASCET criteria, using the distal internal carotid diameter as the denominator. CONTRAST:  75 mL Omnipaque 350 COMPARISON:  None. FINDINGS: CT HEAD FINDINGS Brain: There is no evidence of acute infarct, intracranial hemorrhage, mass, midline shift, or extra-axial fluid collection. The ventricles and sulci are  within normal limits for age. Vascular: Calcified atherosclerosis at the skull base. Skull: No fracture or focal osseous lesion. Sinuses: Mild mucosal thickening  in the paranasal sinuses. Clear mastoid air cells. Orbits: Bilateral cataract extraction. Review of the MIP images confirms the above findings CTA NECK FINDINGS Aortic arch: Normal variant aortic arch branching pattern with common origin of the brachiocephalic and left common carotid arteries. Mild arch atherosclerosis without arch vessel origin stenosis. Right carotid system: Patent with minimal calcified plaque at the carotid bifurcation. No evidence of dissection or stenosis. Tortuous distal cervical ICA. Left carotid system: Patent without evidence of dissection or stenosis. Tortuous distal cervical ICA. Vertebral arteries: Patent with the left being strongly dominant. No evidence of dissection or stenosis. Skeleton: Advanced cervical disc degeneration with mild multilevel listhesis. Other neck: No evidence of cervical lymphadenopathy or mass. Upper chest: Mild centrilobular emphysema. Review of the MIP images confirms the above findings CTA HEAD FINDINGS Anterior circulation: The internal carotid arteries are patent from skull base to carotid termini with minimal nonstenotic atherosclerosis. ACAs and MCAs are patent with mild branch vessel irregularity but no evidence of proximal branch occlusion or significant proximal stenosis. No aneurysm is identified. Posterior circulation: The intracranial vertebral arteries are widely patent to the basilar. Patent PICA, AICA, and SCA origins are seen bilaterally. The basilar artery is widely patent. There are left larger than right posterior communicating arteries. The PCAs are patent with mild irregularity but no significant proximal stenosis. No aneurysm is identified. Venous sinuses: As permitted by contrast timing, patent. Anatomic variants: None. Review of the MIP images confirms the above findings  IMPRESSION: 1. Unremarkable CT appearance of the brain. No evidence of acute intracranial abnormality. 2. Minimal atherosclerosis in the head and neck without large vessel occlusion or significant stenosis. 3.  Aortic Atherosclerosis (ICD10-I70.0). Electronically Signed   By: Logan Bores M.D.   On: 04/19/2019 12:13   Ct Abdomen Pelvis W Contrast  Result Date: 04/19/2019 CLINICAL DATA:  Dizziness body aches. EXAM: CT ABDOMEN AND PELVIS WITH CONTRAST TECHNIQUE: Multidetector CT imaging of the abdomen and pelvis was performed using the standard protocol following bolus administration of intravenous contrast. CONTRAST:  155m OMNIPAQUE IOHEXOL 300 MG/ML  SOLN COMPARISON:  07/28/2017 and 06/28/2013 FINDINGS: Lower chest: Subtle ground-glass and interstitial thickening noted at the right lung base not present in 2019. No signs of effusion or basilar consolidation. Hepatobiliary: Caudate cyst. No suspicious focal lesion in the liver. Pancreas: Unremarkable. No pancreatic ductal dilatation or surrounding inflammatory changes. Spleen: Normal in size without focal abnormality. Adrenals/Urinary Tract: Normal appearance of bilateral adrenal glands. Symmetric enhancement of the bilateral kidneys. Small low-density renal lesions likely cysts. No signs of hydronephrosis. Symmetric renal enhancement. Stomach/Bowel: No signs of bowel obstruction or acute bowel process. Post subtotal colectomy with ileal rectal anastomosis. Signs of mural stratification of the rectum with perirectal lymph nodes and high superior rectal lymph nodes unchanged from previous exam. No signs of bowel obstruction. Vascular/Lymphatic: Vascular structures are patent. There is atherosclerotic change within the abdominal aorta tracking in the iliac vessels. No signs of abdominal or pelvic lymphadenopathy aside from mildly enlarged but stable nodes in the rectal mesentery and low abdomen. Reproductive: Prostate heterogeneity, nonspecific by CT. Urinary  bladder under distended but otherwise unremarkable. Other: Moderate fat containing right inguinal hernia. No signs of free air. No signs of ascites. Musculoskeletal: Extensive spinal degenerative changes without acute or destructive bone process. IMPRESSION: 1. Signs of mural stratification of the rectum with perirectal lymph nodes and high superior rectal lymph nodes are unchanged from previous exam. Compatible with proctitis. 2. Ground-glass and interstitial thickening at the right lung base may  represent pneumonitis. Consider follow-up to ensure resolution at 3 month interval, signs of mild parenchymal distortion and subpleural reticulation also raising the question of early interstitial lung disease. This is new compared to the study of 07/28/2017. 3. Atherosclerotic change in the abdominal aorta and iliac vessels. Aortic Atherosclerosis (ICD10-I70.0). Electronically Signed   By: Zetta Bills M.D.   On: 04/19/2019 11:55    Pending Labs Unresulted Labs (From admission, onward)    Start     Ordered   04/27/19 0500  Creatinine, serum  (enoxaparin (LOVENOX)    CrCl >/= 30 ml/min)  Weekly,   R    Comments: while on enoxaparin therapy    04/20/19 1755   04/21/19 0500  Comprehensive metabolic panel  Daily,   R     04/20/19 1755   04/21/19 0500  C-reactive protein  Daily,   R     04/20/19 1755   04/21/19 0500  Ferritin  Daily,   R     04/20/19 1755   04/21/19 0500  CBC with Differential/Platelet  Daily,   R     04/20/19 1755   04/20/19 1755  C-reactive protein  Once,   STAT     04/20/19 1755   04/20/19 1755  D-dimer, quantitative (not at [facility])  Once,   STAT     04/20/19 1755   04/20/19 1755  Lactate dehydrogenase  Once,   STAT     04/20/19 1755   04/20/19 1755  Ferritin  Once,   STAT     04/20/19 1755          Vitals/Pain Today's Vitals   04/20/19 1434 04/20/19 1445 04/20/19 1500 04/20/19 1615  BP:  112/74 114/77 139/78  Pulse:  80 80 93  Resp:    16  Temp:      TempSrc:       SpO2:  97% 96% 96%  Weight:      Height:      PainSc: 2        Isolation Precautions Airborne and Contact precautions  Medications Medications  enoxaparin (LOVENOX) injection 40 mg (has no administration in time range)  0.9 %  sodium chloride infusion (has no administration in time range)  famotidine (PEPCID) tablet 20 mg (has no administration in time range)  piperacillin-tazobactam (ZOSYN) IVPB 3.375 g (has no administration in time range)  mesalamine (CANASA) suppository 1,000 mg (has no administration in time range)  Naproxen Sodium CAPS 440 mg (has no administration in time range)  lactated ringers bolus 1,000 mL (1,000 mLs Intravenous New Bag/Given 04/20/19 1611)  HYDROcodone-acetaminophen (NORCO/VICODIN) 5-325 MG per tablet 1 tablet (1 tablet Oral Given 04/20/19 1633)    Mobility walks with device Moderate fall risk   Focused Assessments    R Recommendations: See Admitting Provider Note  Report given to:   Additional Notes:

## 2019-04-20 NOTE — ED Notes (Signed)
[facility] advise they are coming to get patient but do not have an ETA--Robert Rhodes

## 2019-04-20 NOTE — ED Triage Notes (Signed)
Pt states he was diagnosed with COVID yesterday.  C/o diarrhea.

## 2019-04-20 NOTE — ED Provider Notes (Signed)
[facility] EMERGENCY DEPARTMENT Provider Note   CSN: [id number] Arrival date & time: 04/20/19  1351     History   Chief Complaint Chief Complaint  Patient presents with  . COVID  . Diarrhea    HPI Robert Rhodes is a 73 y.o. male.      Diarrhea Quality:  Copious and watery Severity:  Moderate Onset quality:  Gradual Duration:  3 days Timing:  Constant Progression:  Worsening Relieved by:  Nothing Worsened by:  Nothing Ineffective treatments:  None tried Associated symptoms: headaches   Associated symptoms: no abdominal pain, no arthralgias, no chills, no recent cough, no diaphoresis, no fever and no vomiting   Risk factors comment:  Patient tested positive for COVID-19 yesterday. Patient also has a history of ulcerative colitis as well as colon cancer.  He was evaluated in the emergency department yesterday for presyncopal episode in the setting of profuse diarrhea.  Notably the plan had initially been to obtain an MRI for further neurovascular evaluation however patient was unable to tolerate MRI therefore he was discharged without it.  He was given IV Flagyl and Cipro yesterday for his colitis.  He did not fill his prescription and has not taken additional medication since yesterday.  Past Medical History:  Diagnosis Date  . Adenomatous colon polyp   . Arthritis   . Claustrophobia   . Colon cancer ([place]) 2015  . COVID-19   . Eye abnormalities    right eye seen in ED 02/17/2014 wearing eye patch using eye drops  . GERD (gastroesophageal reflux disease)   . Hypertension   . Irregular heartbeat    per pt/doesn't know what it does  . Osteoarthritis    bilat knees  needs replacement  . Osteoporosis    pt denies  . Substance abuse ([place])    stopped over 3 years ago  . UC (ulcerative colitis) ([place])   . Vitamin D deficiency     Patient Active Problem List   Diagnosis Date Noted  . Osteoporosis   . Ulcerative colitis, with rectal bleeding  08/06/2015  . Gastroesophageal reflux disease without esophagitis 08/06/2015  . Visual impairment of right eye 06/18/2015  . Essential hypertension, benign 06/18/2015  . Hernia of abdominal cavity 11/27/2014  . Bipolar 1 disorder, mixed, moderate ([place]) 11/27/2014  . Protein-calorie malnutrition, severe ([place]) 08/24/2014  . Cellulitis 08/23/2014  . Medically noncompliant 08/23/2014  . Left leg cellulitis 08/23/2014  . Cellulitis of left leg   . Hypokalemia   . Diarrhea 05/06/2014  . Ulcerative colitis ([place]) 04/17/2014  . Verrucous keratosis 04/17/2014  . Nonspecific (abnormal) findings on radiological and other examination of gastrointestinal tract 07/18/2013  . Acute esophagitis 07/18/2013  . Colon cancer ([place]) 07/16/2013  . Hypovitaminosis D 05/06/2013  . Ulcerative colitis, unspecified 04/04/2013  . Knee pain, bilateral 03/05/2013    Past Surgical History:  Procedure Laterality Date  . COLON RESECTION N/A 08/23/2013   Procedure: Laparoscopic total abdominal colectomy and hernia repair;  Surgeon: Leighton Ruff, MD;  Location: [facility];  Service: General;  Laterality: N/A;  . COLON SURGERY  08/2013  . COLONOSCOPY    . EUS N/A 07/18/2013   Procedure: UPPER ENDOSCOPIC ULTRASOUND (EUS) RADIAL;  Surgeon: Milus Banister, MD;  Location: [facility];  Service: Endoscopy;  Laterality: N/A;  . EYE SURGERY    . HERNIA REPAIR  2005   In [place]/put in a mesh  . INCISIONAL HERNIA REPAIR N/A 02/20/2014   Procedure: LAP ASSISTED INCISIONAL  HERNIA REPAIR LYSIS OF ADHESIONS;  Surgeon: Leighton Ruff, MD;  Location: [facility];  Service: General;  Laterality: N/A;  converted to open @ 0935  . INCISIONAL HERNIA REPAIR N/A 02/20/2014   Procedure: HERNIA REPAIR INCISIONAL;  Surgeon: Leighton Ruff, MD;  Location: [facility];  Service: General;  Laterality: N/A;  With MESH  . INTRAOCULAR LENS INSERTION Bilateral    6 yrs ago  . VENTRAL HERNIA REPAIR          Home Medications    Prior to Admission medications    Medication Sig Start Date End Date Taking? Authorizing Provider  ciprofloxacin (CIPRO) 500 MG tablet Take 1 tablet (500 mg total) by mouth 2 (two) times daily. 04/19/19  Yes Rancour, Annie Main, MD  hydrochlorothiazide (HYDRODIURIL) 25 MG tablet Take 1 tablet (25 mg total) by mouth daily. 07/10/18  Yes [name], MD  mesalamine (CANASA) 1000 MG suppository Place 1 suppository (1,000 mg total) rectally at bedtime. 09/06/18  Yes Pyrtle, Lajuan Lines, MD  metroNIDAZOLE (FLAGYL) 500 MG tablet Take 1 tablet (500 mg total) by mouth 2 (two) times daily. 04/19/19  Yes Rancour, Annie Main, MD  Multiple Vitamin (MULTIVITAMIN WITH MINERALS) TABS tablet Take 1 tablet by mouth daily at 12 noon. Patient taking differently: Take 1 tablet by mouth at bedtime.  08/26/14  Yes Ghimire, Henreitta Leber, MD  Naproxen Sodium (ALEVE) 220 MG CAPS Take 440 mg by mouth 2 (two) times daily as needed (arthritis pain).   Yes [provider]  Omega 3 1200 MG CAPS Take 1,200 mg by mouth daily.    Yes [provider]  cetirizine (ZYRTEC) 10 MG tablet Take 1 tablet (10 mg total) by mouth daily. Patient not taking: Reported on 04/20/2019 07/10/18   [name], MD  Misc. Devices MISC Blood pressure machine.  Diagnosis-hypertension 07/13/18   [name], MD  naproxen (NAPROSYN) 500 MG tablet Take 1 tablet (500 mg total) by mouth 2 (two) times daily with a meal. Patient not taking: Reported on 07/10/2018 02/07/18   [name], MD  pantoprazole (PROTONIX) 40 MG tablet Take 1 tablet (40 mg total) by mouth daily. Patient not taking: Reported on 04/20/2019 08/03/18   Pyrtle, Lajuan Lines, MD    Family History Family History  Problem Relation Age of Onset  . Cancer Mother        type unknown  . Diabetes Father   . Heart disease Father   . Heart disease Sister   . Heart attack Brother   . Healthy Sister   . Colon cancer Neg Hx   . Esophageal cancer Neg Hx   . Rectal cancer Neg Hx   . Stomach cancer Neg Hx     Social  History Social History   Tobacco Use  . Smoking status: Former Smoker    Packs/day: 0.00    Types: Cigarettes    Quit date: 06/14/2003    Years since quitting: 15.8  . Smokeless tobacco: Never Used  . Tobacco comment: quit in July 2005  Substance Use Topics  . Alcohol use: No  . Drug use: Not Currently    Types: 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     Allergies   Ace inhibitors, Advil [ibuprofen], Aspirin, and Tylenol [acetaminophen]   Review of Systems Review of Systems  Constitutional: Negative for chills, diaphoresis and fever.  HENT: Negative for ear pain and sore throat.   Eyes: Negative for pain and visual disturbance.  Respiratory: Negative for cough and shortness  of breath.   Cardiovascular: Negative for chest pain and palpitations.  Gastrointestinal: Positive for abdominal distention, diarrhea and nausea. Negative for abdominal pain, anal bleeding, blood in stool and vomiting.  Genitourinary: Negative for dysuria and hematuria.  Musculoskeletal: Negative for arthralgias and back pain.  Skin: Negative for color change and rash.  Neurological: Positive for headaches. Negative for seizures and syncope.  All other systems reviewed and are negative.    Physical Exam Updated Vital Signs BP 136/77 (BP Location: Left Arm)   Pulse 77   Temp 97.8 F (36.6 C) (Oral)   Resp (!) 23   Ht 5' 9"  (1.753 m)   Wt 93.2 kg   SpO2 98%   BMI 30.34 kg/m   Physical Exam Vitals signs and nursing note reviewed.  Constitutional:      Appearance: He is well-developed. He is ill-appearing.  HENT:     Head: Normocephalic and atraumatic.     Mouth/Throat:     Mouth: Mucous membranes are dry.  Eyes:     Conjunctiva/sclera: Conjunctivae normal.  Neck:     Musculoskeletal: Normal range of motion and neck supple. No neck rigidity.  Cardiovascular:     Rate and Rhythm: Normal rate and regular rhythm.     Heart sounds: No murmur.  Pulmonary:     Effort: Pulmonary  effort is normal. No respiratory distress.     Breath sounds: Normal breath sounds.  Abdominal:     Palpations: Abdomen is soft.     Tenderness: There is no abdominal tenderness.     Comments: Patient has a mildly distended abdomen.  Notably he has a ventral hernia that is present which is status post mesh repair.  There is no evidence concerning for incarceration or strangulation of this hernia.  Genitourinary:    Penis: Normal.      Scrotum/Testes: Normal.  Musculoskeletal: Normal range of motion.        General: No tenderness.     Comments: Patient is complaining of cramping discomfort and has left arm as well as his abdomen and his right leg.  No abnormal range of motion in those areas.  Strength 5 out of 5 in all extremities.  Skin:    General: Skin is warm and dry.  Neurological:     General: No focal deficit present.     Mental Status: He is alert.      ED Treatments / Results  Labs (all labs ordered are listed, but only abnormal results are displayed) Labs Reviewed  COMPREHENSIVE METABOLIC PANEL - Abnormal; Notable for the following components:      Result Value   Sodium 129 (*)    Chloride 93 (*)    CO2 21 (*)    Calcium 8.6 (*)    Total Protein 8.7 (*)    AST 71 (*)    ALT 51 (*)    GFR calc non Af Amer 58 (*)    All other components within normal limits  C-REACTIVE PROTEIN - Abnormal; Notable for the following components:   CRP 3.3 (*)    All other components within normal limits  D-DIMER, QUANTITATIVE (NOT AT [facility]) - Abnormal; Notable for the following components:   D-Dimer, Quant 0.62 (*)    All other components within normal limits  LACTATE DEHYDROGENASE - Abnormal; Notable for the following components:   LDH 214 (*)    All other components within normal limits  CBC WITH DIFFERENTIAL/PLATELET  MAGNESIUM  PHOSPHORUS  FERRITIN  COMPREHENSIVE METABOLIC PANEL  C-REACTIVE PROTEIN  FERRITIN  CBC WITH DIFFERENTIAL/PLATELET    EKG None  Radiology Ct  Angio Head W Or Wo Contrast  Result Date: 04/19/2019 CLINICAL DATA:  Dizziness. Body aches. EXAM: CT ANGIOGRAPHY HEAD AND NECK TECHNIQUE: Multidetector CT imaging of the head and neck was performed using the standard protocol during bolus administration of intravenous contrast. Multiplanar CT image reconstructions and MIPs were obtained to evaluate the vascular anatomy. Carotid stenosis measurements (when applicable) are obtained utilizing NASCET criteria, using the distal internal carotid diameter as the denominator. CONTRAST:  75 mL Omnipaque 350 COMPARISON:  None. FINDINGS: CT HEAD FINDINGS Brain: There is no evidence of acute infarct, intracranial hemorrhage, mass, midline shift, or extra-axial fluid collection. The ventricles and sulci are within normal limits for age. Vascular: Calcified atherosclerosis at the skull base. Skull: No fracture or focal osseous lesion. Sinuses: Mild mucosal thickening in the paranasal sinuses. Clear mastoid air cells. Orbits: Bilateral cataract extraction. Review of the MIP images confirms the above findings CTA NECK FINDINGS Aortic arch: Normal variant aortic arch branching pattern with common origin of the brachiocephalic and left common carotid arteries. Mild arch atherosclerosis without arch vessel origin stenosis. Right carotid system: Patent with minimal calcified plaque at the carotid bifurcation. No evidence of dissection or stenosis. Tortuous distal cervical ICA. Left carotid system: Patent without evidence of dissection or stenosis. Tortuous distal cervical ICA. Vertebral arteries: Patent with the left being strongly dominant. No evidence of dissection or stenosis. Skeleton: Advanced cervical disc degeneration with mild multilevel listhesis. Other neck: No evidence of cervical lymphadenopathy or mass. Upper chest: Mild centrilobular emphysema. Review of the MIP images confirms the above findings CTA HEAD FINDINGS Anterior circulation: The internal carotid arteries are  patent from skull base to carotid termini with minimal nonstenotic atherosclerosis. ACAs and MCAs are patent with mild branch vessel irregularity but no evidence of proximal branch occlusion or significant proximal stenosis. No aneurysm is identified. Posterior circulation: The intracranial vertebral arteries are widely patent to the basilar. Patent PICA, AICA, and SCA origins are seen bilaterally. The basilar artery is widely patent. There are left larger than right posterior communicating arteries. The PCAs are patent with mild irregularity but no significant proximal stenosis. No aneurysm is identified. Venous sinuses: As permitted by contrast timing, patent. Anatomic variants: None. Review of the MIP images confirms the above findings IMPRESSION: 1. Unremarkable CT appearance of the brain. No evidence of acute intracranial abnormality. 2. Minimal atherosclerosis in the head and neck without large vessel occlusion or significant stenosis. 3.  Aortic Atherosclerosis (ICD10-I70.0). Electronically Signed   By: Logan Bores M.D.   On: 04/19/2019 12:13   Ct Angio Neck W And/or Wo Contrast  Result Date: 04/19/2019 CLINICAL DATA:  Dizziness. Body aches. EXAM: CT ANGIOGRAPHY HEAD AND NECK TECHNIQUE: Multidetector CT imaging of the head and neck was performed using the standard protocol during bolus administration of intravenous contrast. Multiplanar CT image reconstructions and MIPs were obtained to evaluate the vascular anatomy. Carotid stenosis measurements (when applicable) are obtained utilizing NASCET criteria, using the distal internal carotid diameter as the denominator. CONTRAST:  75 mL Omnipaque 350 COMPARISON:  None. FINDINGS: CT HEAD FINDINGS Brain: There is no evidence of acute infarct, intracranial hemorrhage, mass, midline shift, or extra-axial fluid collection. The ventricles and sulci are within normal limits for age. Vascular: Calcified atherosclerosis at the skull base. Skull: No fracture or focal  osseous lesion. Sinuses: Mild mucosal thickening in the paranasal sinuses. Clear mastoid air cells. Orbits: Bilateral cataract extraction.  Review of the MIP images confirms the above findings CTA NECK FINDINGS Aortic arch: Normal variant aortic arch branching pattern with common origin of the brachiocephalic and left common carotid arteries. Mild arch atherosclerosis without arch vessel origin stenosis. Right carotid system: Patent with minimal calcified plaque at the carotid bifurcation. No evidence of dissection or stenosis. Tortuous distal cervical ICA. Left carotid system: Patent without evidence of dissection or stenosis. Tortuous distal cervical ICA. Vertebral arteries: Patent with the left being strongly dominant. No evidence of dissection or stenosis. Skeleton: Advanced cervical disc degeneration with mild multilevel listhesis. Other neck: No evidence of cervical lymphadenopathy or mass. Upper chest: Mild centrilobular emphysema. Review of the MIP images confirms the above findings CTA HEAD FINDINGS Anterior circulation: The internal carotid arteries are patent from skull base to carotid termini with minimal nonstenotic atherosclerosis. ACAs and MCAs are patent with mild branch vessel irregularity but no evidence of proximal branch occlusion or significant proximal stenosis. No aneurysm is identified. Posterior circulation: The intracranial vertebral arteries are widely patent to the basilar. Patent PICA, AICA, and SCA origins are seen bilaterally. The basilar artery is widely patent. There are left larger than right posterior communicating arteries. The PCAs are patent with mild irregularity but no significant proximal stenosis. No aneurysm is identified. Venous sinuses: As permitted by contrast timing, patent. Anatomic variants: None. Review of the MIP images confirms the above findings IMPRESSION: 1. Unremarkable CT appearance of the brain. No evidence of acute intracranial abnormality. 2. Minimal  atherosclerosis in the head and neck without large vessel occlusion or significant stenosis. 3.  Aortic Atherosclerosis (ICD10-I70.0). Electronically Signed   By: Logan Bores M.D.   On: 04/19/2019 12:13   Ct Abdomen Pelvis W Contrast  Result Date: 04/19/2019 CLINICAL DATA:  Dizziness body aches. EXAM: CT ABDOMEN AND PELVIS WITH CONTRAST TECHNIQUE: Multidetector CT imaging of the abdomen and pelvis was performed using the standard protocol following bolus administration of intravenous contrast. CONTRAST:  190m OMNIPAQUE IOHEXOL 300 MG/ML  SOLN COMPARISON:  07/28/2017 and 06/28/2013 FINDINGS: Lower chest: Subtle ground-glass and interstitial thickening noted at the right lung base not present in 2019. No signs of effusion or basilar consolidation. Hepatobiliary: Caudate cyst. No suspicious focal lesion in the liver. Pancreas: Unremarkable. No pancreatic ductal dilatation or surrounding inflammatory changes. Spleen: Normal in size without focal abnormality. Adrenals/Urinary Tract: Normal appearance of bilateral adrenal glands. Symmetric enhancement of the bilateral kidneys. Small low-density renal lesions likely cysts. No signs of hydronephrosis. Symmetric renal enhancement. Stomach/Bowel: No signs of bowel obstruction or acute bowel process. Post subtotal colectomy with ileal rectal anastomosis. Signs of mural stratification of the rectum with perirectal lymph nodes and high superior rectal lymph nodes unchanged from previous exam. No signs of bowel obstruction. Vascular/Lymphatic: Vascular structures are patent. There is atherosclerotic change within the abdominal aorta tracking in the iliac vessels. No signs of abdominal or pelvic lymphadenopathy aside from mildly enlarged but stable nodes in the rectal mesentery and low abdomen. Reproductive: Prostate heterogeneity, nonspecific by CT. Urinary bladder under distended but otherwise unremarkable. Other: Moderate fat containing right inguinal hernia. No signs of  free air. No signs of ascites. Musculoskeletal: Extensive spinal degenerative changes without acute or destructive bone process. IMPRESSION: 1. Signs of mural stratification of the rectum with perirectal lymph nodes and high superior rectal lymph nodes are unchanged from previous exam. Compatible with proctitis. 2. Ground-glass and interstitial thickening at the right lung base may represent pneumonitis. Consider follow-up to ensure resolution at 3 month interval, signs  of mild parenchymal distortion and subpleural reticulation also raising the question of early interstitial lung disease. This is new compared to the study of 07/28/2017. 3. Atherosclerotic change in the abdominal aorta and iliac vessels. Aortic Atherosclerosis (ICD10-I70.0). Electronically Signed   By: Zetta Bills M.D.   On: 04/19/2019 11:55   Dg Chest Port 1 View  Result Date: 04/20/2019 CLINICAL DATA:  Covid positive EXAM: PORTABLE CHEST 1 VIEW COMPARISON:  February 27, 2014 FINDINGS: The heart size and mediastinal contours are within normal limits. There is subtle patchy airspace opacity seen at the right lung base. The left lung is clear. No pleural effusion. No acute osseous abnormality. IMPRESSION: Patchy airspace opacity at the right lung base. The findings in the lungs are nonspecific, but concerning for atypical infection, which includes viral pneumonia. Electronically Signed   By: Prudencio Pair M.D.   On: 04/20/2019 19:29    Procedures Procedures (including critical care time)  Medications Ordered in ED Medications  enoxaparin (LOVENOX) injection 40 mg (40 mg Subcutaneous Given 04/20/19 1923)  0.9 %  sodium chloride infusion ( Intravenous New Bag/Given 04/21/19 0030)  famotidine (PEPCID) tablet 20 mg (20 mg Oral Given 04/20/19 2252)  piperacillin-tazobactam (ZOSYN) IVPB 3.375 g (3.375 g Intravenous New Bag/Given 04/20/19 2308)  mesalamine (CANASA) suppository 1,000 mg (1,000 mg Rectal Given 04/20/19 2253)  pneumococcal 23  valent vaccine (PNU-IMMUNE) injection 0.5 mL (has no administration in time range)  lactated ringers bolus 1,000 mL (0 mLs Intravenous Stopped 04/20/19 1800)  HYDROcodone-acetaminophen (NORCO/VICODIN) 5-325 MG per tablet 1 tablet (1 tablet Oral Given 04/20/19 1633)     Initial Impression / Assessment and Plan / ED Course  I have reviewed the triage vital signs and the nursing notes.  Pertinent labs & imaging results that were available during my care of the patient were reviewed by me and considered in my medical decision making (see chart for details).        Patient is a 73 year old male with history of physical exam as above presents emergency department for evaluation of profuse diarrhea in the setting of diagnosis of COVID-19 yesterday.  Patient is resting comfortably at this time and his ambulatory in the emergency department without desaturation however he appears clinically volume down.  Skin turgor poor.  Mild oral dehydration noted.  Labs were obtained including CBC, metabolic panel which demonstrated significant hyponatremia at 129 however his BUN and creatinine are within normal limits.  Patient was seen in the emergency department for presyncope yesterday as well as a profuse diarrhea.  He had a CT scan of his abdomen pelvis at the time which was notable for proctocolitis.  He was started on Cipro and Flagyl IV but did not pick up these prescriptions and has not taken the medication since he was discharged yesterday.  I gave him an additional dose in the emergency department today.  Given his decreased p.o. intake, profuse diarrhea, COVID-19 positivity, and advanced age patient will be admitted to inpatient service for further work-up and management.  Patient was seen in conjunction with my attending physician.  Final Clinical Impressions(s) / ED Diagnoses   Final diagnoses:  [id number]    ED Discharge Orders    None       Romona Curls, MD 04/21/19 [id number]    Isla Pence, MD 04/21/19 1614

## 2019-04-21 DIAGNOSIS — F3162 Bipolar disorder, current episode mixed, moderate: Secondary | ICD-10-CM

## 2019-04-21 DIAGNOSIS — N179 Acute kidney failure, unspecified: Secondary | ICD-10-CM

## 2019-04-21 DIAGNOSIS — A0839 Other viral enteritis: Secondary | ICD-10-CM

## 2019-04-21 DIAGNOSIS — U071 COVID-19: Principal | ICD-10-CM

## 2019-04-21 DIAGNOSIS — I10 Essential (primary) hypertension: Secondary | ICD-10-CM

## 2019-04-21 LAB — CBC WITH DIFFERENTIAL/PLATELET
Abs Immature Granulocytes: 0.02 10*3/uL (ref 0.00–0.07)
Basophils Absolute: 0 10*3/uL (ref 0.0–0.1)
Basophils Relative: 0 %
Eosinophils Absolute: 0 10*3/uL (ref 0.0–0.5)
Eosinophils Relative: 0 %
HCT: 42 % (ref 39.0–52.0)
Hemoglobin: 13.4 g/dL (ref 13.0–17.0)
Immature Granulocytes: 0 %
Lymphocytes Relative: 30 %
Lymphs Abs: 1.4 10*3/uL (ref 0.7–4.0)
MCH: 27.1 pg (ref 26.0–34.0)
MCHC: 31.9 g/dL (ref 30.0–36.0)
MCV: 84.8 fL (ref 80.0–100.0)
Monocytes Absolute: 0.6 10*3/uL (ref 0.1–1.0)
Monocytes Relative: 12 %
Neutro Abs: 2.7 10*3/uL (ref 1.7–7.7)
Neutrophils Relative %: 58 %
Platelets: 314 10*3/uL (ref 150–400)
RBC: 4.95 MIL/uL (ref 4.22–5.81)
RDW: 14.1 % (ref 11.5–15.5)
WBC: 4.8 10*3/uL (ref 4.0–10.5)
nRBC: 0 % (ref 0.0–0.2)

## 2019-04-21 LAB — COMPREHENSIVE METABOLIC PANEL
ALT: 46 U/L — ABNORMAL HIGH (ref 0–44)
AST: 60 U/L — ABNORMAL HIGH (ref 15–41)
Albumin: 3.7 g/dL (ref 3.5–5.0)
Alkaline Phosphatase: 75 U/L (ref 38–126)
Anion gap: 10 (ref 5–15)
BUN: 13 mg/dL (ref 8–23)
CO2: 27 mmol/L (ref 22–32)
Calcium: 8 mg/dL — ABNORMAL LOW (ref 8.9–10.3)
Chloride: 97 mmol/L — ABNORMAL LOW (ref 98–111)
Creatinine, Ser: 1.28 mg/dL — ABNORMAL HIGH (ref 0.61–1.24)
GFR calc Af Amer: >60 mL/min (ref >60)
GFR calc non Af Amer: 54 mL/min — ABNORMAL LOW (ref >60)
Glucose, Bld: 91 mg/dL (ref 70–99)
Potassium: 3.7 mmol/L (ref 3.5–5.1)
Sodium: 134 mmol/L — ABNORMAL LOW (ref 135–145)
Total Bilirubin: 1.4 mg/dL — ABNORMAL HIGH (ref 0.3–1.2)
Total Protein: 7.8 g/dL (ref 6.5–8.1)

## 2019-04-21 LAB — SAMPLE TO BLOOD BANK

## 2019-04-21 LAB — C-REACTIVE PROTEIN: CRP: 2.8 mg/dL — ABNORMAL HIGH (ref <1.0)

## 2019-04-21 LAB — PROCALCITONIN: Procalcitonin: <0.1 ng/mL

## 2019-04-21 MED ORDER — PNEUMOCOCCAL VAC POLYVALENT 25 MCG/0.5ML IJ INJ
0.5000 mL | INJECTION | INTRAMUSCULAR | Status: DC | PRN
  Filled 2019-04-21: qty 0.5

## 2019-04-21 MED ORDER — SODIUM CHLORIDE 0.9 % IV SOLN
100.0000 mg | INTRAVENOUS | Status: AC
  Administered 2019-04-22 – 2019-04-25 (×4): 100 mg via INTRAVENOUS
  Filled 2019-04-21 (×4): qty 20

## 2019-04-21 MED ORDER — SODIUM CHLORIDE 0.9 % IV SOLN
INTRAVENOUS | Status: AC
  Administered 2019-04-21: 11:00:00 via INTRAVENOUS

## 2019-04-21 MED ORDER — MECLIZINE HCL 25 MG PO TABS
25.0000 mg | ORAL_TABLET | Freq: Three times a day (TID) | ORAL | Status: DC | PRN
  Filled 2019-04-21: qty 1

## 2019-04-21 MED ORDER — ALPRAZOLAM 0.5 MG PO TABS
0.2500 mg | ORAL_TABLET | Freq: Once | ORAL | Status: AC
  Administered 2019-04-21: 0.25 mg via ORAL
  Filled 2019-04-21: qty 1

## 2019-04-21 MED ORDER — CLONAZEPAM 0.5 MG PO TABS
0.5000 mg | ORAL_TABLET | Freq: Two times a day (BID) | ORAL | Status: DC | PRN
  Administered 2019-04-21 – 2019-04-25 (×5): 0.5 mg via ORAL
  Filled 2019-04-21 (×5): qty 1

## 2019-04-21 MED ORDER — SODIUM CHLORIDE 0.9 % IV SOLN
200.0000 mg | Freq: Once | INTRAVENOUS | Status: AC
  Administered 2019-04-21: 200 mg via INTRAVENOUS
  Filled 2019-04-21: qty 40

## 2019-04-21 NOTE — Progress Notes (Signed)
Patient admitted to unit, tele applied. Patient oriented to unit and room. Education provided on safe ambulation, IS use, flutter valve and self proneing. Patient states he will update his brother via the phone in the room. Will continue with POC.

## 2019-04-21 NOTE — Progress Notes (Signed)
Remdesivir - Pharmacy Brief Note   O:  ALT: 46 CXR: Patchy airspace opacity at the right lung base SpO2: 97% on room air   A/P:  Remdesivir 200 mg once followed by 100 mg daily x 4 days.   Gretta Arab PharmD, BCPS Clinical pharmacist phone 7am- 5pm: [phone] 04/21/2019 9:50 AM

## 2019-04-21 NOTE — Progress Notes (Signed)
[facility] PROGRESS NOTE    Progress Note  Robert Rhodes  DEY:[id number] DOB: December 26, 1945 DOA: 04/20/2019 PCP: [name], MD     Brief Narrative:   Robert Rhodes is an 73 y.o. male past medical history of colon cancer, with an ileorectal anastomosis, ventral hernia repair in 2015, ulcerative colitis essential hypertension comes into the ED on 04/19/2019 for dizziness and profound diarrhea was found to be COVID-19 positive, and MRI was recommended to rule out a cerebellar stroke. The patient relates that he is lightheaded upon standing.  Orthostatics were negative, CTA of the head was negative for large vessel occlusion, CT scan of the abdomen show evidence of proctocolitis he was started on IV ciprofloxacin and Flagyl, there was also groundglass opacities bilaterally in his lung he denies any cough or fever.  GI was consulted and recommended empiric antibiotics and follow-up as an outpatient.  After the patient was repeatedly informed of the need of an MRI he left [place] had he relate he could not tolerate it returns back on 04/20/2019 with persistent symptoms and diarrhea, he was unable to fill his prescription.  Orthostatics were negative he was not hypoxic with ambulation.  Assessment/Plan:   Acute nonbloody severe diarrhea : He does not have a leukocytosis, will check a procalcitonin.  CT scan of the abdomen and pelvis shows edematous rectum with perirectal lymph node unchanged from previous CT scan, there is likely proctitis which is not new as it was seen previously on CT scan, also showed groundglass and interstitial thickening of the right lung base. We will discontinue empiric antibiotics check a procalcitonin. His physical exam is benign he denies any abdominal pain tolerating his diet well. I believe this is more of an COVID-19 prostatitis which is causing his diarrhea then an infectious or an onset of colitis. Opioid and start him on IV remdesivir  continue to monitor inflammatory markers.  COVID-19 viral pneumonitis: He is currently satting greater 95% on room air. His inflammatory markers are slightly elevated. We will check a procalcitonin. Continue to monitor saturations closely.  Acute kidney injury: Likely due to profound diarrhea his baseline creatinine is around 0.8 we will start IV fluid hydration and recheck basic metabolic panel in the morning. Encourage him to drink as much water as possible.  Hypovolemic hyponatremia: Slowly improving with IV fluid hydration and encourage him to increase his oral intake his sodium is improved today.  Mild metabolic acidosis: Likely due to diarrhea.  Continue IV fluid hydration, his sodium is slowly improving.  Orthostatic hypotension: He nicely describes dizziness upon standing, orthostatics were positive lying flat 140/74 and sitting 117/74.  He is nonfocal on physical exam doubt stroke.  We will continue IV fluid hydration. Recheck orthostatics tomorrow morning.  Bipolar 1 disorder, mixed, moderate ([place]): Currently on no medications.  Essential hypertension, benign: Blood pressure is currently stable. Continue to hold antihypertensive medication.  Sacral decubitus ulcer stage II present on admission: Continue wound care.  RN Pressure Injury Documentation: Pressure Ulcer 08/23/14 Stage II -  Partial thickness loss of dermis presenting as a shallow open ulcer with a red, pink wound bed without slough. (Active)  08/23/14 2200  Location: Buttocks  Location Orientation: Right  Staging: Stage II -  Partial thickness loss of dermis presenting as a shallow open ulcer with a red, pink wound bed without slough.  Wound Description (Comments):   Present on Admission: Yes      DVT prophylaxis: lovenox Family Communication:none Disposition Plan/Barrier to  D/C: home in 5 days Code Status:     Code Status Orders  (From admission, onward)         Start     Ordered    04/20/19 1754  Full code  Continuous     04/20/19 1755        Code Status History    Date Active Date Inactive Code Status Order ID Comments User Context   08/23/2014 0203 08/27/2014 2220 Full Code [id number]  Toy Baker, MD Inpatient   02/20/2014 1317 02/24/2014 1433 Full Code [id number]  Leighton Ruff, MD Inpatient   08/23/2013 1526 09/03/2013 1658 Full Code [id number]  Leighton Ruff, MD Inpatient   Advance Care Planning Activity        IV Access:    Peripheral IV   Procedures and diagnostic studies:   Ct Angio Head W Or Wo Contrast  Result Date: 04/19/2019 CLINICAL DATA:  Dizziness. Body aches. EXAM: CT ANGIOGRAPHY HEAD AND NECK TECHNIQUE: Multidetector CT imaging of the head and neck was performed using the standard protocol during bolus administration of intravenous contrast. Multiplanar CT image reconstructions and MIPs were obtained to evaluate the vascular anatomy. Carotid stenosis measurements (when applicable) are obtained utilizing NASCET criteria, using the distal internal carotid diameter as the denominator. CONTRAST:  75 mL Omnipaque 350 COMPARISON:  None. FINDINGS: CT HEAD FINDINGS Brain: There is no evidence of acute infarct, intracranial hemorrhage, mass, midline shift, or extra-axial fluid collection. The ventricles and sulci are within normal limits for age. Vascular: Calcified atherosclerosis at the skull base. Skull: No fracture or focal osseous lesion. Sinuses: Mild mucosal thickening in the paranasal sinuses. Clear mastoid air cells. Orbits: Bilateral cataract extraction. Review of the MIP images confirms the above findings CTA NECK FINDINGS Aortic arch: Normal variant aortic arch branching pattern with common origin of the brachiocephalic and left common carotid arteries. Mild arch atherosclerosis without arch vessel origin stenosis. Right carotid system: Patent with minimal calcified plaque at the carotid bifurcation. No evidence of dissection or stenosis.  Tortuous distal cervical ICA. Left carotid system: Patent without evidence of dissection or stenosis. Tortuous distal cervical ICA. Vertebral arteries: Patent with the left being strongly dominant. No evidence of dissection or stenosis. Skeleton: Advanced cervical disc degeneration with mild multilevel listhesis. Other neck: No evidence of cervical lymphadenopathy or mass. Upper chest: Mild centrilobular emphysema. Review of the MIP images confirms the above findings CTA HEAD FINDINGS Anterior circulation: The internal carotid arteries are patent from skull base to carotid termini with minimal nonstenotic atherosclerosis. ACAs and MCAs are patent with mild branch vessel irregularity but no evidence of proximal branch occlusion or significant proximal stenosis. No aneurysm is identified. Posterior circulation: The intracranial vertebral arteries are widely patent to the basilar. Patent PICA, AICA, and SCA origins are seen bilaterally. The basilar artery is widely patent. There are left larger than right posterior communicating arteries. The PCAs are patent with mild irregularity but no significant proximal stenosis. No aneurysm is identified. Venous sinuses: As permitted by contrast timing, patent. Anatomic variants: None. Review of the MIP images confirms the above findings IMPRESSION: 1. Unremarkable CT appearance of the brain. No evidence of acute intracranial abnormality. 2. Minimal atherosclerosis in the head and neck without large vessel occlusion or significant stenosis. 3.  Aortic Atherosclerosis (ICD10-I70.0). Electronically Signed   By: Logan Bores M.D.   On: 04/19/2019 12:13   Ct Angio Neck W And/or Wo Contrast  Result Date: 04/19/2019 CLINICAL DATA:  Dizziness. Body aches. EXAM: CT ANGIOGRAPHY HEAD  AND NECK TECHNIQUE: Multidetector CT imaging of the head and neck was performed using the standard protocol during bolus administration of intravenous contrast. Multiplanar CT image reconstructions and  MIPs were obtained to evaluate the vascular anatomy. Carotid stenosis measurements (when applicable) are obtained utilizing NASCET criteria, using the distal internal carotid diameter as the denominator. CONTRAST:  75 mL Omnipaque 350 COMPARISON:  None. FINDINGS: CT HEAD FINDINGS Brain: There is no evidence of acute infarct, intracranial hemorrhage, mass, midline shift, or extra-axial fluid collection. The ventricles and sulci are within normal limits for age. Vascular: Calcified atherosclerosis at the skull base. Skull: No fracture or focal osseous lesion. Sinuses: Mild mucosal thickening in the paranasal sinuses. Clear mastoid air cells. Orbits: Bilateral cataract extraction. Review of the MIP images confirms the above findings CTA NECK FINDINGS Aortic arch: Normal variant aortic arch branching pattern with common origin of the brachiocephalic and left common carotid arteries. Mild arch atherosclerosis without arch vessel origin stenosis. Right carotid system: Patent with minimal calcified plaque at the carotid bifurcation. No evidence of dissection or stenosis. Tortuous distal cervical ICA. Left carotid system: Patent without evidence of dissection or stenosis. Tortuous distal cervical ICA. Vertebral arteries: Patent with the left being strongly dominant. No evidence of dissection or stenosis. Skeleton: Advanced cervical disc degeneration with mild multilevel listhesis. Other neck: No evidence of cervical lymphadenopathy or mass. Upper chest: Mild centrilobular emphysema. Review of the MIP images confirms the above findings CTA HEAD FINDINGS Anterior circulation: The internal carotid arteries are patent from skull base to carotid termini with minimal nonstenotic atherosclerosis. ACAs and MCAs are patent with mild branch vessel irregularity but no evidence of proximal branch occlusion or significant proximal stenosis. No aneurysm is identified. Posterior circulation: The intracranial vertebral arteries are widely  patent to the basilar. Patent PICA, AICA, and SCA origins are seen bilaterally. The basilar artery is widely patent. There are left larger than right posterior communicating arteries. The PCAs are patent with mild irregularity but no significant proximal stenosis. No aneurysm is identified. Venous sinuses: As permitted by contrast timing, patent. Anatomic variants: None. Review of the MIP images confirms the above findings IMPRESSION: 1. Unremarkable CT appearance of the brain. No evidence of acute intracranial abnormality. 2. Minimal atherosclerosis in the head and neck without large vessel occlusion or significant stenosis. 3.  Aortic Atherosclerosis (ICD10-I70.0). Electronically Signed   By: Logan Bores M.D.   On: 04/19/2019 12:13   Ct Abdomen Pelvis W Contrast  Result Date: 04/19/2019 CLINICAL DATA:  Dizziness body aches. EXAM: CT ABDOMEN AND PELVIS WITH CONTRAST TECHNIQUE: Multidetector CT imaging of the abdomen and pelvis was performed using the standard protocol following bolus administration of intravenous contrast. CONTRAST:  168m OMNIPAQUE IOHEXOL 300 MG/ML  SOLN COMPARISON:  07/28/2017 and 06/28/2013 FINDINGS: Lower chest: Subtle ground-glass and interstitial thickening noted at the right lung base not present in 2019. No signs of effusion or basilar consolidation. Hepatobiliary: Caudate cyst. No suspicious focal lesion in the liver. Pancreas: Unremarkable. No pancreatic ductal dilatation or surrounding inflammatory changes. Spleen: Normal in size without focal abnormality. Adrenals/Urinary Tract: Normal appearance of bilateral adrenal glands. Symmetric enhancement of the bilateral kidneys. Small low-density renal lesions likely cysts. No signs of hydronephrosis. Symmetric renal enhancement. Stomach/Bowel: No signs of bowel obstruction or acute bowel process. Post subtotal colectomy with ileal rectal anastomosis. Signs of mural stratification of the rectum with perirectal lymph nodes and high  superior rectal lymph nodes unchanged from previous exam. No signs of bowel obstruction. Vascular/Lymphatic: Vascular structures  are patent. There is atherosclerotic change within the abdominal aorta tracking in the iliac vessels. No signs of abdominal or pelvic lymphadenopathy aside from mildly enlarged but stable nodes in the rectal mesentery and low abdomen. Reproductive: Prostate heterogeneity, nonspecific by CT. Urinary bladder under distended but otherwise unremarkable. Other: Moderate fat containing right inguinal hernia. No signs of free air. No signs of ascites. Musculoskeletal: Extensive spinal degenerative changes without acute or destructive bone process. IMPRESSION: 1. Signs of mural stratification of the rectum with perirectal lymph nodes and high superior rectal lymph nodes are unchanged from previous exam. Compatible with proctitis. 2. Ground-glass and interstitial thickening at the right lung base may represent pneumonitis. Consider follow-up to ensure resolution at 3 month interval, signs of mild parenchymal distortion and subpleural reticulation also raising the question of early interstitial lung disease. This is new compared to the study of 07/28/2017. 3. Atherosclerotic change in the abdominal aorta and iliac vessels. Aortic Atherosclerosis (ICD10-I70.0). Electronically Signed   By: Zetta Bills M.D.   On: 04/19/2019 11:55   Dg Chest Port 1 View  Result Date: 04/20/2019 CLINICAL DATA:  Covid positive EXAM: PORTABLE CHEST 1 VIEW COMPARISON:  February 27, 2014 FINDINGS: The heart size and mediastinal contours are within normal limits. There is subtle patchy airspace opacity seen at the right lung base. The left lung is clear. No pleural effusion. No acute osseous abnormality. IMPRESSION: Patchy airspace opacity at the right lung base. The findings in the lungs are nonspecific, but concerning for atypical infection, which includes viral pneumonia. Electronically Signed   By: Prudencio Pair  M.D.   On: 04/20/2019 19:29     Medical Consultants:    None.  Anti-Infectives:   IV remdesivir.*  Subjective:    Robert Rhodes relates he has been having nonstop acute watery diarrhea this morning he has had already 4 bowel movements.  Objective:    Vitals:   04/21/19 0526 04/21/19 0533 04/21/19 0542 04/21/19 0559  BP:      Pulse: 98 94 74 66  Resp: 15 15 10 13   Temp:      TempSrc:      SpO2: 94% 96% 100% 97%  Weight:      Height:       SpO2: 97 %   Intake/Output Summary (Last 24 hours) at 04/21/2019 0727 Last data filed at 04/21/2019 0500 Gross per 24 hour  Intake 1833.48 ml  Output 400 ml  Net 1433.48 ml   Filed Weights   04/20/19 1430 04/21/19 0030  Weight: 93.4 kg 93.2 kg    Exam: General exam: In no acute distress. Respiratory system: Good air movement and clear to auscultation. Cardiovascular system: S1 & S2 heard, RRR. No JVD. Gastrointestinal system: Positive bowel sounds soft nontender nondistended Central nervous system: Alert and oriented. No focal neurological deficits. Extremities: No pedal edema. Skin: No rashes, lesions or ulcers Psychiatry: Judgement and insight appear normal. Mood & affect appropriate.    Data Reviewed:    Labs: Basic Metabolic Panel: Recent Labs  Lab 04/19/19 0829 04/19/19 0840 04/20/19 1552 04/20/19 1605  NA 136 137  --  129*  K 3.8 3.9  --  3.5  CL 103 100  --  93*  CO2 24  --   --  21*  GLUCOSE 106* 104*  --  99  BUN 8 10  --  11  CREATININE 1.01 0.90  --  1.21  CALCIUM 8.3*  --   --  8.6*  MG  --   --  2.2  --   PHOS  --   --  2.7  --    GFR Estimated Creatinine Clearance: 59.5 mL/min (by C-G formula based on SCr of 1.21 mg/dL). Liver Function Tests: Recent Labs  Lab 04/19/19 0829 04/20/19 1605  AST 47* 71*  ALT 34 51*  ALKPHOS 69 86  BILITOT 0.6 1.1  PROT 7.2 8.7*  ALBUMIN 3.1* 3.8   Recent Labs  Lab 04/19/19 0829  LIPASE 21   No results for input(s): AMMONIA in the last 168  hours. Coagulation profile Recent Labs  Lab 04/19/19 0829  INR 0.9   COVID-19 Labs  Recent Labs    04/20/19 1919  DDIMER 0.62*  FERRITIN 109  LDH 214*  CRP 3.3*    Lab Results  Component Value Date   SARSCOV2NAA POSITIVE (A) 04/19/2019    CBC: Recent Labs  Lab 04/19/19 0840 04/19/19 0955 04/20/19 1605  WBC  --  6.3 5.3  NEUTROABS  --  4.2 3.0  HGB 15.6 13.3 14.9  HCT 46.0 41.6 45.4  MCV  --  86.7 83.9  PLT  --  349 372   Cardiac Enzymes: No results for input(s): CKTOTAL, CKMB, CKMBINDEX, TROPONINI in the last 168 hours. BNP (last 3 results) No results for input(s): PROBNP in the last 8760 hours. CBG: Recent Labs  Lab 04/19/19 0824  GLUCAP 103*   D-Dimer: Recent Labs    04/20/19 1919  DDIMER 0.62*   Hgb A1c: No results for input(s): HGBA1C in the last 72 hours. Lipid Profile: No results for input(s): CHOL, HDL, LDLCALC, TRIG, CHOLHDL, LDLDIRECT in the last 72 hours. Thyroid function studies: No results for input(s): TSH, T4TOTAL, T3FREE, THYROIDAB in the last 72 hours.  Invalid input(s): FREET3 Anemia work up: Recent Labs    04/20/19 1919  FERRITIN 109   Sepsis Labs: Recent Labs  Lab 04/19/19 0955 04/20/19 1605  WBC 6.3 5.3   Microbiology Recent Results (from the past 240 hour(s))  SARS CORONAVIRUS 2 (TAT 6-24 HRS) Nasopharyngeal Nasopharyngeal Swab     Status: Abnormal   Collection Time: 04/19/19  3:50 PM   Specimen: Nasopharyngeal Swab  Result Value Ref Range Status   SARS Coronavirus 2 POSITIVE (A) NEGATIVE Final    Comment: EMAILED TO [facility] 0240 04/19/2019 [place] (NOTE) SARS-CoV-2 target nucleic acids are DETECTED. The SARS-CoV-2 RNA is generally detectable in upper and lower respiratory specimens during the acute phase of infection. Positive results are indicative of active infection with SARS-CoV-2. Clinical  correlation with patient history and other diagnostic information is necessary to determine patient  infection status. Positive results do  not rule out bacterial infection or co-infection with other viruses. The expected result is Negative. Fact Sheet for Patients: SugarRoll.be Fact Sheet for Healthcare Providers: https://www.woods-mathews.com/ This test is not yet approved or cleared by the [place] FDA and  has been authorized for detection and/or diagnosis of SARS-CoV-2 by FDA under an Emergency Use Authorization (EUA). This EUA will remain  in effect (meaning this test can be used) for the duration of the COVID-19 declara tion under Section 564(b)(1) of the Act, 21 U.S.C. section 360bbb-3(b)(1), unless the authorization is terminated or revoked sooner. Performed at [facility], [place] [address] [place], [place] [address]      Medications:   . enoxaparin (LOVENOX) injection  40 mg Subcutaneous Q24H  . famotidine  20 mg Oral BID  . mesalamine  1,000 mg Rectal QHS   Continuous Infusions: . sodium chloride  75 mL/hr at 04/21/19 0500  . piperacillin-tazobactam (ZOSYN)  IV 12.5 mL/hr at 04/21/19 0500      LOS: 1 day   Charlynne Cousins  [facility]  04/21/2019, 7:27 AM

## 2019-04-21 NOTE — Progress Notes (Signed)
Patient assessed at 21. Vitals WNL, A/Ox4, calm and resting in bed. Assisted with ambulation to bathroom and back to bed. No needs expressed. Informed patient I would be back shortly with his morning medications, for him to try to get some more rest.  0519 pt called out. Restless, anxious, attempting to get out of bed. A/Ox4. Very agitated. Patient states "i'm claustrophobic, i'm anxious, I need to walk out in the hall, I need to go outside, the best thing for COVID is fresh air." Patient assisted in removing gown. Attempted to verbally deescalate, patient not responsive to verbal deescalating.  Offered patient a small fan to help him cool down, patient accepts. Patient now pacing in room stating "I take something for my anxiety." Patient unable to tell me what medication he takes at home. MD paged- new orders placed. Patient states "my mouth is dry". Patient offered water at bedside and mouth moisturizer gel- patient accepts both. Patient now sitting in chair.  Carla RN leaves to go get medication.  Patient now presenting with agressive body language- moving back and forth flipping fingers on knees with eyes shut while in chair. Patient requests to go back to bed. I tell patient we should wait till the other nurse gets back to help him ambulate back to bed, patient agrees.  4599 one time does of ativan 0.48m given. Assisted back to bed. Fresh water provided and small fan on bedside table. All needs met per patient. Bed alarm turned on. Charge nurse notified.

## 2019-04-22 LAB — CBC WITH DIFFERENTIAL/PLATELET
Abs Immature Granulocytes: 0.02 10*3/uL (ref 0.00–0.07)
Basophils Absolute: 0 10*3/uL (ref 0.0–0.1)
Basophils Relative: 0 %
Eosinophils Absolute: 0 10*3/uL (ref 0.0–0.5)
Eosinophils Relative: 0 %
HCT: 41.7 % (ref 39.0–52.0)
Hemoglobin: 13.1 g/dL (ref 13.0–17.0)
Immature Granulocytes: 0 %
Lymphocytes Relative: 38 %
Lymphs Abs: 1.9 10*3/uL (ref 0.7–4.0)
MCH: 26.7 pg (ref 26.0–34.0)
MCHC: 31.4 g/dL (ref 30.0–36.0)
MCV: 85.1 fL (ref 80.0–100.0)
Monocytes Absolute: 0.8 10*3/uL (ref 0.1–1.0)
Monocytes Relative: 15 %
Neutro Abs: 2.2 10*3/uL (ref 1.7–7.7)
Neutrophils Relative %: 47 %
Platelets: 339 10*3/uL (ref 150–400)
RBC: 4.9 MIL/uL (ref 4.22–5.81)
RDW: 14 % (ref 11.5–15.5)
WBC: 4.9 10*3/uL (ref 4.0–10.5)
nRBC: 0 % (ref 0.0–0.2)

## 2019-04-22 LAB — C-REACTIVE PROTEIN: CRP: 2.9 mg/dL — ABNORMAL HIGH (ref <1.0)

## 2019-04-22 LAB — COMPREHENSIVE METABOLIC PANEL
ALT: 46 U/L — ABNORMAL HIGH (ref 0–44)
AST: 63 U/L — ABNORMAL HIGH (ref 15–41)
Albumin: 3.3 g/dL — ABNORMAL LOW (ref 3.5–5.0)
Alkaline Phosphatase: 64 U/L (ref 38–126)
Anion gap: 8 (ref 5–15)
BUN: 12 mg/dL (ref 8–23)
CO2: 26 mmol/L (ref 22–32)
Calcium: 8 mg/dL — ABNORMAL LOW (ref 8.9–10.3)
Chloride: 102 mmol/L (ref 98–111)
Creatinine, Ser: 0.95 mg/dL (ref 0.61–1.24)
GFR calc Af Amer: >60 mL/min (ref >60)
GFR calc non Af Amer: >60 mL/min (ref >60)
Glucose, Bld: 88 mg/dL (ref 70–99)
Potassium: 3.2 mmol/L — ABNORMAL LOW (ref 3.5–5.1)
Sodium: 136 mmol/L (ref 135–145)
Total Bilirubin: 0.8 mg/dL (ref 0.3–1.2)
Total Protein: 7.3 g/dL (ref 6.5–8.1)

## 2019-04-22 LAB — PROCALCITONIN: Procalcitonin: <0.1 ng/mL

## 2019-04-22 MED ORDER — POTASSIUM CHLORIDE CRYS ER 20 MEQ PO TBCR
40.0000 meq | EXTENDED_RELEASE_TABLET | Freq: Three times a day (TID) | ORAL | Status: AC
  Administered 2019-04-22 (×3): 40 meq via ORAL
  Filled 2019-04-22 (×3): qty 2

## 2019-04-22 NOTE — Progress Notes (Signed)
[facility] PROGRESS NOTE    Progress Note  KORDEL LEAVY  SMO:[id number] DOB: Dec 11, 1945 DOA: 04/20/2019 PCP: [name], MD     Brief Narrative:   Robert Rhodes is an 73 y.o. male past medical history of colon cancer, with an ileorectal anastomosis, ventral hernia repair in 2015, ulcerative colitis essential hypertension comes into the ED on 04/19/2019 for dizziness and profound diarrhea was found to be COVID-19 positive, and MRI was recommended to rule out a cerebellar stroke. The patient relates that he is lightheaded upon standing.  Orthostatics were negative, CTA of the head was negative for large vessel occlusion, CT scan of the abdomen show evidence of proctocolitis he was started on IV ciprofloxacin and Flagyl, there was also groundglass opacities bilaterally in his lung he denies any cough or fever.  GI was consulted and recommended empiric antibiotics and follow-up as an outpatient.  After the patient was repeatedly informed of the need of an MRI he left [place] had he relate he could not tolerate it returns back on 04/20/2019 with persistent symptoms and diarrhea, he was unable to fill his prescription.  Orthostatics were negative he was not hypoxic with ambulation.  Assessment/Plan:   Acute nonbloody severe diarrhea due to COVID-19 /viral pneumonitis: : Procalcitonin is less than 0.1. His abdominal exam is benign. Continue IV remdesivir. Patient is to remain greater than 94% on room air. Inflammatory markers are improving slowly. Continue to monitor saturations closely.  Acute kidney injury: Likely prerenal azotemia in the setting of profound diarrhea. Resolved with IV fluid hydration. KVO IV fluids. Encourage him to drink as much fluid as possible.  Hypovolemic hyponatremia: Resolved with IV fluid hydration. Can KVO IV fluids.  Mild metabolic acidosis: Likely due to diarrhea now resolved.  Diarrhea has improved.  Orthostatic  hypotension: He describes dizziness upon standing, his orthostatics were positive. Continue IV fluid hydration. We will recheck orthostatics today.  Bipolar 1 disorder, mixed, moderate ([place]): Currently on no medications.  Essential hypertension, benign: Continue to hold antihypertensive medication, blood pressure seems to be stable.  Sacral decubitus ulcer stage II present on admission: Continue wound care.  RN Pressure Injury Documentation: Pressure Ulcer 08/23/14 Stage II -  Partial thickness loss of dermis presenting as a shallow open ulcer with a red, pink wound bed without slough. (Active)  08/23/14 2200  Location: Buttocks  Location Orientation: Right  Staging: Stage II -  Partial thickness loss of dermis presenting as a shallow open ulcer with a red, pink wound bed without slough.  Wound Description (Comments):   Present on Admission: Yes      DVT prophylaxis: lovenox Family Communication:none Disposition Plan/Barrier to D/C: home in 4 days Code Status:     Code Status Orders  (From admission, onward)         Start     Ordered   04/20/19 1754  Full code  Continuous     04/20/19 1755        Code Status History    Date Active Date Inactive Code Status Order ID Comments User Context   08/23/2014 0203 08/27/2014 2220 Full Code [id number]  Toy Baker, MD Inpatient   02/20/2014 1317 02/24/2014 1433 Full Code [id number]  Leighton Ruff, MD Inpatient   08/23/2013 1526 09/03/2013 1658 Full Code [id number]  Leighton Ruff, MD Inpatient   Advance Care Planning Activity        IV Access:    Peripheral IV   Procedures and diagnostic studies:   Dg  Chest Port 1 View  Result Date: 04/20/2019 CLINICAL DATA:  Covid positive EXAM: PORTABLE CHEST 1 VIEW COMPARISON:  February 27, 2014 FINDINGS: The heart size and mediastinal contours are within normal limits. There is subtle patchy airspace opacity seen at the right lung base. The left lung is clear. No pleural  effusion. No acute osseous abnormality. IMPRESSION: Patchy airspace opacity at the right lung base. The findings in the lungs are nonspecific, but concerning for atypical infection, which includes viral pneumonia. Electronically Signed   By: Prudencio Pair M.D.   On: 04/20/2019 19:29     Medical Consultants:    None.  Anti-Infectives:   IV remdesivir.*  Subjective:    ADEBAYO Rhodes relates his bowel movement have improved he is only had 1 bowel movement this morning.  Objective:    Vitals:   04/21/19 0700 04/21/19 1600 04/21/19 1950 04/22/19 0409  BP: 140/72 133/69 133/77 138/68  Pulse: 73  78 77  Resp: 16 (!) 22 18 16   Temp: 98.2 F (36.8 C) 98.5 F (36.9 C) 98.8 F (37.1 C) 98.3 F (36.8 C)  TempSrc: Oral Oral Oral Oral  SpO2: 97% 99% 100% 99%  Weight:      Height:       SpO2: 99 %   Intake/Output Summary (Last 24 hours) at 04/22/2019 0741 Last data filed at 04/22/2019 0600 Gross per 24 hour  Intake 1368.47 ml  Output -  Net 1368.47 ml   Filed Weights   04/20/19 1430 04/21/19 0030  Weight: 93.4 kg 93.2 kg    Exam: General exam: In no acute distress, morbidly obese Respiratory system: Good air movement and clear to auscultation Cardiovascular system: S1 & S2 heard, RRR. No JVD. Gastrointestinal system: Abdomen is nondistended, soft and nontender.  Central nervous system: Alert and oriented. No focal neurological deficits. Extremities: No pedal edema. Skin: No rashes, lesions or ulcers Psychiatry: Judgement and insight appear normal. Mood & affect appropriate.   Data Reviewed:    Labs: Basic Metabolic Panel: Recent Labs  Lab 04/19/19 0829 04/19/19 0840 04/20/19 1552 04/20/19 1605 04/21/19 0730 04/22/19 0524  NA 136 137  --  129* 134* 136  K 3.8 3.9  --  3.5 3.7 3.2*  CL 103 100  --  93* 97* 102  CO2 24  --   --  21* 27 26  GLUCOSE 106* 104*  --  99 91 88  BUN 8 10  --  11 13 12   CREATININE 1.01 0.90  --  1.21 1.28* 0.95  CALCIUM 8.3*  --    --  8.6* 8.0* 8.0*  MG  --   --  2.2  --   --   --   PHOS  --   --  2.7  --   --   --    GFR Estimated Creatinine Clearance: 75.7 mL/min (by C-G formula based on SCr of 0.95 mg/dL). Liver Function Tests: Recent Labs  Lab 04/19/19 0829 04/20/19 1605 04/21/19 0730 04/22/19 0524  AST 47* 71* 60* 63*  ALT 34 51* 46* 46*  ALKPHOS 69 86 75 64  BILITOT 0.6 1.1 1.4* 0.8  PROT 7.2 8.7* 7.8 7.3  ALBUMIN 3.1* 3.8 3.7 3.3*   Recent Labs  Lab 04/19/19 0829  LIPASE 21   No results for input(s): AMMONIA in the last 168 hours. Coagulation profile Recent Labs  Lab 04/19/19 0829  INR 0.9   COVID-19 Labs  Recent Labs    04/20/19 1919 04/21/19 0730  DDIMER 0.62*  --   FERRITIN 109  --   LDH 214*  --   CRP 3.3* 2.8*    Lab Results  Component Value Date   SARSCOV2NAA POSITIVE (A) 04/19/2019    CBC: Recent Labs  Lab 04/19/19 0840 04/19/19 0955 04/20/19 1605 04/21/19 0730 04/22/19 0524  WBC  --  6.3 5.3 4.8 4.9  NEUTROABS  --  4.2 3.0 2.7 2.2  HGB 15.6 13.3 14.9 13.4 13.1  HCT 46.0 41.6 45.4 42.0 41.7  MCV  --  86.7 83.9 84.8 85.1  PLT  --  349 372 314 339   Cardiac Enzymes: No results for input(s): CKTOTAL, CKMB, CKMBINDEX, TROPONINI in the last 168 hours. BNP (last 3 results) No results for input(s): PROBNP in the last 8760 hours. CBG: Recent Labs  Lab 04/19/19 0824  GLUCAP 103*   D-Dimer: Recent Labs    04/20/19 1919  DDIMER 0.62*   Hgb A1c: No results for input(s): HGBA1C in the last 72 hours. Lipid Profile: No results for input(s): CHOL, HDL, LDLCALC, TRIG, CHOLHDL, LDLDIRECT in the last 72 hours. Thyroid function studies: No results for input(s): TSH, T4TOTAL, T3FREE, THYROIDAB in the last 72 hours.  Invalid input(s): FREET3 Anemia work up: Recent Labs    04/20/19 1919  FERRITIN 109   Sepsis Labs: Recent Labs  Lab 04/19/19 0955 04/20/19 1605 04/21/19 0730 04/22/19 0524  PROCALCITON  --   --  <0.10  --   WBC 6.3 5.3 4.8 4.9    Microbiology Recent Results (from the past 240 hour(s))  SARS CORONAVIRUS 2 (TAT 6-24 HRS) Nasopharyngeal Nasopharyngeal Swab     Status: Abnormal   Collection Time: 04/19/19  3:50 PM   Specimen: Nasopharyngeal Swab  Result Value Ref Range Status   SARS Coronavirus 2 POSITIVE (A) NEGATIVE Final    Comment: EMAILED TO [place] 1478 04/19/2019 [place] (NOTE) SARS-CoV-2 target nucleic acids are DETECTED. The SARS-CoV-2 RNA is generally detectable in upper and lower respiratory specimens during the acute phase of infection. Positive results are indicative of active infection with SARS-CoV-2. Clinical  correlation with patient history and other diagnostic information is necessary to determine patient infection status. Positive results do  not rule out bacterial infection or co-infection with other viruses. The expected result is Negative. Fact Sheet for Patients: SugarRoll.be Fact Sheet for Healthcare Providers: https://www.woods-mathews.com/ This test is not yet approved or cleared by the [place] FDA and  has been authorized for detection and/or diagnosis of SARS-CoV-2 by FDA under an Emergency Use Authorization (EUA). This EUA will remain  in effect (meaning this test can be used) for the duration of the COVID-19 declara tion under Section 564(b)(1) of the Act, 21 U.S.C. section 360bbb-3(b)(1), unless the authorization is terminated or revoked sooner. Performed at [facility], [place] [address], [place], [place] [address]      Medications:   . enoxaparin (LOVENOX) injection  40 mg Subcutaneous Q24H  . famotidine  20 mg Oral BID  . mesalamine  1,000 mg Rectal QHS   Continuous Infusions: . remdesivir 100 mg in NS 250 mL        LOS: 2 days   Charlynne Cousins  [facility]  04/22/2019, 7:41 AM

## 2019-04-22 NOTE — Evaluation (Signed)
Physical Therapy Evaluation Patient Details Name: Robert Rhodes MRN: [id number] DOB: 21-Dec-1945 Today's Date: 04/22/2019   History of Present Illness  DAMONTA COSSEY is an 73 y.o. male past medical history of colon cancer, with an ileorectal anastomosis, ventral hernia repair in 2015, ulcerative colitis essential hypertension comes into the ED on 04/19/2019 for dizziness and profound diarrhea was found to be COVID-19 positive, left AMA and returned  11/7.  Clinical Impression   The patient is up ad lib, ambulated x800' with SPC modified independent. Encouraged patient to ambulate again. No furhter PT needs.  PT will sign off.     Follow Up Recommendations No PT follow up    Equipment Recommendations  None recommended by PT    Recommendations for Other Services       Precautions / Restrictions Precautions Precautions: None      Mobility  Bed Mobility               General bed mobility comments: OOB  Transfers Overall transfer level: Independent                  Ambulation/Gait Ambulation/Gait assistance: Modified independent (Device/Increase time) Gait Distance (Feet): 800 Feet Assistive device: Straight cane Gait Pattern/deviations: Step-through pattern   Gait velocity interpretation: 1.31 - 2.62 ft/sec, indicative of limited community ambulator General Gait Details: has a limp to the left with Stance  Stairs            Wheelchair Mobility    Modified Rankin (Stroke Patients Only)       Balance Overall balance assessment: No apparent balance deficits (not formally assessed)                                           Pertinent Vitals/Pain      Home Living Family/patient expects to be discharged to:: Private residence     Type of Home: Apartment Home Access: Level entry     Home Layout: One level Home Equipment: Cane - single point      Prior Function Level of Independence: Independent with assistive  device(s)         Comments: uses SPC     Hand Dominance        Extremity/Trunk Assessment   Upper Extremity Assessment Upper Extremity Assessment: Overall WFL for tasks assessed    Lower Extremity Assessment Lower Extremity Assessment: Overall WFL for tasks assessed    Cervical / Trunk Assessment Cervical / Trunk Assessment: Normal  Communication   Communication: No difficulties  Cognition Arousal/Alertness: Awake/alert Behavior During Therapy: WFL for tasks assessed/performed Overall Cognitive Status: Within Functional Limits for tasks assessed                                        General Comments      Exercises     Assessment/Plan    PT Assessment Patent does not need any further PT services  PT Problem List         PT Treatment Interventions      PT Goals (Current goals can be found in the Care Plan section)  Acute Rehab PT Goals Patient Stated Goal: go home PT Goal Formulation: All assessment and education complete, DC therapy    Frequency     Barriers to discharge  Co-evaluation               AM-PAC PT "6 Clicks" Mobility  Outcome Measure Help needed turning from your back to your side while in a flat bed without using bedrails?: None Help needed moving from lying on your back to sitting on the side of a flat bed without using bedrails?: None Help needed moving to and from a bed to a chair (including a wheelchair)?: None Help needed standing up from a chair using your arms (e.g., wheelchair or bedside chair)?: None Help needed to walk in hospital room?: None Help needed climbing 3-5 steps with a railing? : None 6 Click Score: 24    End of Session   Activity Tolerance: Patient tolerated treatment well Patient left: in chair Nurse Communication: Mobility status PT Visit Diagnosis: Difficulty in walking, not elsewhere classified (R26.2)    Time: 8185-9093 PT Time Calculation (min) (ACUTE ONLY): 20  min   Charges:   PT Evaluation $PT Eval Low Complexity: 1 Low          [place]  Office [phone]   Claretha Cooper 04/22/2019, 4:45 PM

## 2019-04-23 LAB — CBC WITH DIFFERENTIAL/PLATELET
Abs Immature Granulocytes: 0.01 10*3/uL (ref 0.00–0.07)
Basophils Absolute: 0 10*3/uL (ref 0.0–0.1)
Basophils Relative: 0 %
Eosinophils Absolute: 0 10*3/uL (ref 0.0–0.5)
Eosinophils Relative: 0 %
HCT: 42.8 % (ref 39.0–52.0)
Hemoglobin: 13.3 g/dL (ref 13.0–17.0)
Immature Granulocytes: 0 %
Lymphocytes Relative: 49 %
Lymphs Abs: 2.6 10*3/uL (ref 0.7–4.0)
MCH: 26.8 pg (ref 26.0–34.0)
MCHC: 31.1 g/dL (ref 30.0–36.0)
MCV: 86.3 fL (ref 80.0–100.0)
Monocytes Absolute: 0.6 10*3/uL (ref 0.1–1.0)
Monocytes Relative: 12 %
Neutro Abs: 2.1 10*3/uL (ref 1.7–7.7)
Neutrophils Relative %: 39 %
Platelets: 358 10*3/uL (ref 150–400)
RBC: 4.96 MIL/uL (ref 4.22–5.81)
RDW: 14.2 % (ref 11.5–15.5)
WBC: 5.3 10*3/uL (ref 4.0–10.5)
nRBC: 0 % (ref 0.0–0.2)

## 2019-04-23 LAB — COMPREHENSIVE METABOLIC PANEL
ALT: 50 U/L — ABNORMAL HIGH (ref 0–44)
AST: 67 U/L — ABNORMAL HIGH (ref 15–41)
Albumin: 3.4 g/dL — ABNORMAL LOW (ref 3.5–5.0)
Alkaline Phosphatase: 75 U/L (ref 38–126)
Anion gap: 10 (ref 5–15)
BUN: 14 mg/dL (ref 8–23)
CO2: 26 mmol/L (ref 22–32)
Calcium: 8.3 mg/dL — ABNORMAL LOW (ref 8.9–10.3)
Chloride: 102 mmol/L (ref 98–111)
Creatinine, Ser: 0.92 mg/dL (ref 0.61–1.24)
GFR calc Af Amer: >60 mL/min (ref >60)
GFR calc non Af Amer: >60 mL/min (ref >60)
Glucose, Bld: 90 mg/dL (ref 70–99)
Potassium: 3.6 mmol/L (ref 3.5–5.1)
Sodium: 138 mmol/L (ref 135–145)
Total Bilirubin: 0.6 mg/dL (ref 0.3–1.2)
Total Protein: 7.5 g/dL (ref 6.5–8.1)

## 2019-04-23 LAB — C-REACTIVE PROTEIN: CRP: 4.6 mg/dL — ABNORMAL HIGH (ref <1.0)

## 2019-04-23 LAB — PROCALCITONIN: Procalcitonin: <0.1 ng/mL

## 2019-04-23 MED ORDER — HYDROCHLOROTHIAZIDE 25 MG PO TABS
25.0000 mg | ORAL_TABLET | Freq: Every day | ORAL | Status: DC
  Administered 2019-04-23 – 2019-04-25 (×3): 25 mg via ORAL
  Filled 2019-04-23 (×3): qty 1

## 2019-04-23 MED ORDER — HYDROCOD POLST-CPM POLST ER 10-8 MG/5ML PO SUER
5.0000 mL | Freq: Two times a day (BID) | ORAL | Status: DC | PRN
  Administered 2019-04-23 – 2019-04-24 (×2): 5 mL via ORAL
  Filled 2019-04-23 (×2): qty 5

## 2019-04-23 NOTE — Progress Notes (Signed)
[facility] PROGRESS NOTE    Progress Note  Robert Rhodes  GEZ:[id number] DOB: 1946-01-28 DOA: 04/20/2019 PCP: [name], MD     Brief Narrative:   Robert Rhodes is an 73 y.o. male past medical history of colon cancer, with an ileorectal anastomosis, ventral hernia repair in 2015, ulcerative colitis essential hypertension comes into the ED on 04/19/2019 for dizziness and profound diarrhea was found to be COVID-19 positive, and MRI was recommended to rule out a cerebellar stroke. The patient relates that he is lightheaded upon standing.  Orthostatics were negative, CTA of the head was negative for large vessel occlusion, CT scan of the abdomen show evidence of proctocolitis he was started on IV ciprofloxacin and Flagyl, there was also groundglass opacities bilaterally in his lung he denies any cough or fever.  GI was consulted and recommended empiric antibiotics and follow-up as an outpatient.  After the patient was repeatedly informed of the need of an MRI he left [place] had he relate he could not tolerate it returns back on 04/20/2019 with persistent symptoms and diarrhea, he was unable to fill his prescription.  Orthostatics were negative he was not hypoxic with ambulation.  Assessment/Plan:   Acute nonbloody severe diarrhea due to COVID-19 /viral pneumonitis: : Currently saturating greater than 94% on room air Abdominal exam is benign. Not a candidate for steroids as he is not hypoxic. Inflammatory markers continue to improve slowly. Continue IV remdesivir we have stopped his Imodium.  No further diarrhea. He probably be discharged in 24 to 48 hours if he has no further diarrhea.  Acute kidney injury: Likely prerenal azotemia in the setting of profound diarrhea, resolved with IV fluid hydration.  Hypovolemic hyponatremia: Resolved after hydration.  Mild non-anion gap metabolic acidosis: Likely due to diarrhea now resolved.  Diarrhea has  improved.  Orthostatic hypotension: He did relate dizziness upon standing orthostatics with positive, he was hydrated and symptoms resolved.  Bipolar 1 disorder, mixed, moderate ([name]): Currently on no medications.  Essential hypertension, benign: Resume antihypertensive medication. Blood pressure is trending up.  Sacral decubitus ulcer stage II present on admission: Continue wound care.  RN Pressure Injury Documentation: Pressure Ulcer 08/23/14 Stage II -  Partial thickness loss of dermis presenting as a shallow open ulcer with a red, pink wound bed without slough. (Active)  08/23/14 2200  Location: Buttocks  Location Orientation: Right  Staging: Stage II -  Partial thickness loss of dermis presenting as a shallow open ulcer with a red, pink wound bed without slough.  Wound Description (Comments):   Present on Admission: Yes      DVT prophylaxis: lovenox Family Communication:none Disposition Plan/Barrier to D/C: Home hopefully in the morning. Code Status:     Code Status Orders  (From admission, onward)         Start     Ordered   04/20/19 1754  Full code  Continuous     04/20/19 1755        Code Status History    Date Active Date Inactive Code Status Order ID Comments User Context   08/23/2014 0203 08/27/2014 2220 Full Code [id number]  Toy Baker, MD Inpatient   02/20/2014 1317 02/24/2014 1433 Full Code [id number]  Leighton Ruff, MD Inpatient   08/23/2013 1526 09/03/2013 1658 Full Code [id number]  Leighton Ruff, MD Inpatient   Advance Care Planning Activity        IV Access:    Peripheral IV   Procedures and diagnostic studies:  No results found.   Medical Consultants:    None.  Anti-Infectives:   IV remdesivir.*  Subjective:    Robert Rhodes relates he is had no further diarrhea his energy has returned.  Objective:    Vitals:   04/22/19 1600 04/22/19 2200 04/23/19 0435 04/23/19 0725  BP: 140/90 128/60 (!) 115/55 (!) 142/85   Pulse: 75 74 77 78  Resp:   18 18  Temp: 98 F (36.7 C) 98.2 F (36.8 C) 98 F (36.7 C) 98.1 F (36.7 C)  TempSrc: Oral Oral Oral Oral  SpO2: 99% 98% 95% 97%  Weight:      Height:       SpO2: 97 %   Intake/Output Summary (Last 24 hours) at 04/23/2019 0756 Last data filed at 04/22/2019 2300 Gross per 24 hour  Intake 1080 ml  Output -  Net 1080 ml   Filed Weights   04/20/19 1430 04/21/19 0030  Weight: 93.4 kg 93.2 kg    Exam: General exam: In no acute distress. Respiratory system: Good air movement and clear to auscultation. Cardiovascular system: S1 & S2 heard, RRR. No JVD, murmurs, rubs, gallops or clicks.  Gastrointestinal system: Abdomen is nondistended, soft and nontender.  Central nervous system: Alert and oriented. No focal neurological deficits. Extremities: No pedal edema. Skin: No rashes, lesions or ulcers Psychiatry: Judgement and insight appear normal. Mood & affect appropriate.  .   Data Reviewed:    Labs: Basic Metabolic Panel: Recent Labs  Lab 04/19/19 0829 04/19/19 0840 04/20/19 1552 04/20/19 1605 04/21/19 0730 04/22/19 0524 04/23/19 0120  NA 136 137  --  129* 134* 136 138  K 3.8 3.9  --  3.5 3.7 3.2* 3.6  CL 103 100  --  93* 97* 102 102  CO2 24  --   --  21* 27 26 26   GLUCOSE 106* 104*  --  99 91 88 90  BUN 8 10  --  11 13 12 14   CREATININE 1.01 0.90  --  1.21 1.28* 0.95 0.92  CALCIUM 8.3*  --   --  8.6* 8.0* 8.0* 8.3*  MG  --   --  2.2  --   --   --   --   PHOS  --   --  2.7  --   --   --   --    GFR Estimated Creatinine Clearance: 78.2 mL/min (by C-G formula based on SCr of 0.92 mg/dL). Liver Function Tests: Recent Labs  Lab 04/19/19 0829 04/20/19 1605 04/21/19 0730 04/22/19 0524 04/23/19 0120  AST 47* 71* 60* 63* 67*  ALT 34 51* 46* 46* 50*  ALKPHOS 69 86 75 64 75  BILITOT 0.6 1.1 1.4* 0.8 0.6  PROT 7.2 8.7* 7.8 7.3 7.5  ALBUMIN 3.1* 3.8 3.7 3.3* 3.4*   Recent Labs  Lab 04/19/19 0829  LIPASE 21   No results for  input(s): AMMONIA in the last 168 hours. Coagulation profile Recent Labs  Lab 04/19/19 0829  INR 0.9   COVID-19 Labs  Recent Labs    04/20/19 1919 04/21/19 0730 04/22/19 0524 04/23/19 0120  DDIMER 0.62*  --   --   --   FERRITIN 109  --   --   --   LDH 214*  --   --   --   CRP 3.3* 2.8* 2.9* 4.6*    Lab Results  Component Value Date   SARSCOV2NAA POSITIVE (A) 04/19/2019    CBC: Recent Labs  Lab 04/19/19  6945 04/20/19 1605 04/21/19 0730 04/22/19 0524 04/23/19 0120  WBC 6.3 5.3 4.8 4.9 5.3  NEUTROABS 4.2 3.0 2.7 2.2 2.1  HGB 13.3 14.9 13.4 13.1 13.3  HCT 41.6 45.4 42.0 41.7 42.8  MCV 86.7 83.9 84.8 85.1 86.3  PLT 349 372 314 339 358   Cardiac Enzymes: No results for input(s): CKTOTAL, CKMB, CKMBINDEX, TROPONINI in the last 168 hours. BNP (last 3 results) No results for input(s): PROBNP in the last 8760 hours. CBG: Recent Labs  Lab 04/19/19 0824  GLUCAP 103*   D-Dimer: Recent Labs    04/20/19 1919  DDIMER 0.62*   Hgb A1c: No results for input(s): HGBA1C in the last 72 hours. Lipid Profile: No results for input(s): CHOL, HDL, LDLCALC, TRIG, CHOLHDL, LDLDIRECT in the last 72 hours. Thyroid function studies: No results for input(s): TSH, T4TOTAL, T3FREE, THYROIDAB in the last 72 hours.  Invalid input(s): FREET3 Anemia work up: Recent Labs    04/20/19 1919  FERRITIN 109   Sepsis Labs: Recent Labs  Lab 04/20/19 1605 04/21/19 0730 04/22/19 0524 04/23/19 0120  PROCALCITON  --  <0.10 <0.10 <0.10  WBC 5.3 4.8 4.9 5.3   Microbiology Recent Results (from the past 240 hour(s))  SARS CORONAVIRUS 2 (TAT 6-24 HRS) Nasopharyngeal Nasopharyngeal Swab     Status: Abnormal   Collection Time: 04/19/19  3:50 PM   Specimen: Nasopharyngeal Swab  Result Value Ref Range Status   SARS Coronavirus 2 POSITIVE (A) NEGATIVE Final    Comment: EMAILED TO [place] 0388 04/19/2019 [place] (NOTE) SARS-CoV-2 target nucleic acids are DETECTED. The SARS-CoV-2  RNA is generally detectable in upper and lower respiratory specimens during the acute phase of infection. Positive results are indicative of active infection with SARS-CoV-2. Clinical  correlation with patient history and other diagnostic information is necessary to determine patient infection status. Positive results do  not rule out bacterial infection or co-infection with other viruses. The expected result is Negative. Fact Sheet for Patients: SugarRoll.be Fact Sheet for Healthcare Providers: https://www.woods-mathews.com/ This test is not yet approved or cleared by the [place] FDA and  has been authorized for detection and/or diagnosis of SARS-CoV-2 by FDA under an Emergency Use Authorization (EUA). This EUA will remain  in effect (meaning this test can be used) for the duration of the COVID-19 declara tion under Section 564(b)(1) of the Act, 21 U.S.C. section 360bbb-3(b)(1), unless the authorization is terminated or revoked sooner. Performed at [facility], [place] [address], [place], [place] [address]      Medications:   . enoxaparin (LOVENOX) injection  40 mg Subcutaneous Q24H  . famotidine  20 mg Oral BID  . mesalamine  1,000 mg Rectal QHS   Continuous Infusions: . remdesivir 100 mg in NS 250 mL 100 mg (04/22/19 0920)      LOS: 3 days   Charlynne Cousins  [facility]  04/23/2019, 7:56 AM

## 2019-04-24 ENCOUNTER — Encounter: Payer: Medicare HMO | Admitting: Family Medicine

## 2019-04-24 LAB — CBC WITH DIFFERENTIAL/PLATELET
Abs Immature Granulocytes: 0.05 10*3/uL (ref 0.00–0.07)
Basophils Absolute: 0 10*3/uL (ref 0.0–0.1)
Basophils Relative: 0 %
Eosinophils Absolute: 0.1 10*3/uL (ref 0.0–0.5)
Eosinophils Relative: 1 %
HCT: 42.4 % (ref 39.0–52.0)
Hemoglobin: 13.4 g/dL (ref 13.0–17.0)
Immature Granulocytes: 1 %
Lymphocytes Relative: 46 %
Lymphs Abs: 2.5 10*3/uL (ref 0.7–4.0)
MCH: 27.1 pg (ref 26.0–34.0)
MCHC: 31.6 g/dL (ref 30.0–36.0)
MCV: 85.8 fL (ref 80.0–100.0)
Monocytes Absolute: 0.7 10*3/uL (ref 0.1–1.0)
Monocytes Relative: 13 %
Neutro Abs: 2.2 10*3/uL (ref 1.7–7.7)
Neutrophils Relative %: 39 %
Platelets: 325 10*3/uL (ref 150–400)
RBC: 4.94 MIL/uL (ref 4.22–5.81)
RDW: 14.4 % (ref 11.5–15.5)
WBC: 5.5 10*3/uL (ref 4.0–10.5)
nRBC: 0.4 % — ABNORMAL HIGH (ref 0.0–0.2)

## 2019-04-24 LAB — COMPREHENSIVE METABOLIC PANEL
ALT: 61 U/L — ABNORMAL HIGH (ref 0–44)
AST: 79 U/L — ABNORMAL HIGH (ref 15–41)
Albumin: 3.2 g/dL — ABNORMAL LOW (ref 3.5–5.0)
Alkaline Phosphatase: 76 U/L (ref 38–126)
Anion gap: 11 (ref 5–15)
BUN: 12 mg/dL (ref 8–23)
CO2: 23 mmol/L (ref 22–32)
Calcium: 8.9 mg/dL (ref 8.9–10.3)
Chloride: 100 mmol/L (ref 98–111)
Creatinine, Ser: 0.86 mg/dL (ref 0.61–1.24)
GFR calc Af Amer: >60 mL/min (ref >60)
GFR calc non Af Amer: >60 mL/min (ref >60)
Glucose, Bld: 81 mg/dL (ref 70–99)
Potassium: 3.4 mmol/L — ABNORMAL LOW (ref 3.5–5.1)
Sodium: 134 mmol/L — ABNORMAL LOW (ref 135–145)
Total Bilirubin: 0.3 mg/dL (ref 0.3–1.2)
Total Protein: 7.2 g/dL (ref 6.5–8.1)

## 2019-04-24 LAB — C-REACTIVE PROTEIN: CRP: 4.2 mg/dL — ABNORMAL HIGH (ref <1.0)

## 2019-04-24 MED ORDER — POTASSIUM CHLORIDE CRYS ER 20 MEQ PO TBCR
40.0000 meq | EXTENDED_RELEASE_TABLET | Freq: Once | ORAL | Status: AC
  Administered 2019-04-24: 40 meq via ORAL
  Filled 2019-04-24: qty 2

## 2019-04-24 NOTE — Progress Notes (Signed)
PROGRESS NOTE                                                                                                                                                                                                             Patient Demographics:    Robert Rhodes, is a 73 y.o. male, DOB - [birth year], EZM:[id number]  Outpatient Primary MD for the patient is [name], MD   Admit date - 04/20/2019   LOS - 4  Chief Complaint  Patient presents with  . COVID  . Diarrhea       Brief Narrative: Patient is a 73 y.o. male with PMHx of colon cancer-s/p ileorectal anastomosis, ulcerative colitis, HTN-presented with diarrhea, dizziness on 11/6-he was diagnosed with COVID-19 but subsequently left the emergency room AMA-pesented back to the ED with worsening diarrhea on 11/18-upon further evaluation-he was found to have bilateral groundglass opacities in his lungs on imaging.  He was then transferred to the hospitalist service at [facility] for further evaluation and treatment.  See below for further details.    Subjective:    Robert Rhodes today is much better-his diarrhea has slowed down-he had 2 formed stools earlier this morning.   Assessment  & Plan :   COVID-19 pneumonitis and gastroenteritis: Improved-no further diarrhea-he is on room air-continue IV remdesivir-last dose on 11/12.  Fever: afebrile  O2 requirements: On RA  COVID-19 Labs: Recent Labs    04/22/19 0524 04/23/19 0120 04/24/19 0145  CRP 2.9* 4.6* 4.2*    Lab Results  Component Value Date   SARSCOV2NAA POSITIVE (A) 04/19/2019     COVID-19 Medications: Steroids: Not given due to lack of hypoxemia Remdesivir: 11/8>> Actemra: Not indicated Convalescent Plasma: Not indicated Research Studies:N/A  Other medications: Diuretics:Euvolemic-no need for lasi Antibiotics:Not needed as no evidence of bacterial infection  Prone/Incentive  Spirometry: encouraged  incentive spirometry use 3-4/hour.  DVT Prophylaxis  :  Lovenox   AKI: Hemodynamically mediated-resolved.  Hyponatremia: Secondary to dehydration-resolved.  Orthostatic hypotension: Secondary to volume loss from diarrhea-resolved.  He has ambulated in the room without any issues.  HTN: Controlled-continue HCTZ  Hypokalemia: Replete and recheck.  History of ulcerative colitis: Continue mesalamine.  RN Pressure Injury Documentation: Pressure Ulcer 08/23/14 Stage II -  Partial thickness loss of dermis presenting as a shallow open ulcer with a red, pink wound bed  without slough. (Active)  08/23/14 2200  Location: Buttocks  Location Orientation: Right  Staging: Stage II -  Partial thickness loss of dermis presenting as a shallow open ulcer with a red, pink wound bed without slough.  Wound Description (Comments):   Present on Admission: Yes    ABG:    Component Value Date/Time   TCO2 25 04/19/2019 0840    Vent Settings: N/A  Condition - Stable  Family Communication  :  None at bedside  Code Status :  Full Code  Diet :  Diet Order            Diet vegetarian Room service appropriate? Yes; Fluid consistency: Thin  Diet effective now               Disposition Plan  :  Remain hospitalized  Barriers to discharge: Complete 5 days of IV Remdesivir  Consults  :  None  Procedures  :  None  Antibiotics  :    Anti-infectives (From admission, onward)   Start     Dose/Rate Route Frequency Ordered Stop   04/22/19 1000  remdesivir 100 mg in sodium chloride 0.9 % 250 mL IVPB     100 mg 500 mL/hr over 30 Minutes Intravenous Every 24 hours 04/21/19 0959 04/26/19 0959   04/21/19 1200  remdesivir 200 mg in sodium chloride 0.9 % 250 mL IVPB     200 mg 500 mL/hr over 30 Minutes Intravenous Once 04/21/19 0959 04/21/19 1253   04/20/19 1800  piperacillin-tazobactam (ZOSYN) IVPB 3.375 g  Status:  Discontinued     3.375 g 12.5 mL/hr over 240 Minutes  Intravenous Every 8 hours 04/20/19 1757 04/21/19 0942   04/20/19 1715  ciprofloxacin (CIPRO) IVPB 400 mg  Status:  Discontinued     400 mg 200 mL/hr over 60 Minutes Intravenous  Once 04/20/19 1713 04/20/19 1755   04/20/19 1715  metroNIDAZOLE (FLAGYL) IVPB 500 mg  Status:  Discontinued     500 mg 100 mL/hr over 60 Minutes Intravenous  Once 04/20/19 1713 04/20/19 1755      Inpatient Medications  Scheduled Meds: . enoxaparin (LOVENOX) injection  40 mg Subcutaneous Q24H  . famotidine  20 mg Oral BID  . hydrochlorothiazide  25 mg Oral Daily  . mesalamine  1,000 mg Rectal QHS   Continuous Infusions: . remdesivir 100 mg in NS 250 mL 100 mg (04/24/19 1049)   PRN Meds:.chlorpheniramine-HYDROcodone, clonazePAM, pneumococcal 23 valent vaccine   Time Spent in minutes  25  See all Orders from today for further details   Oren Binet M.D on 04/24/2019 at 2:15 PM  To page go to [email or web address] - use universal password  [facility] -  Office  [phone]    Objective:   Vitals:   04/23/19 2026 04/24/19 0552 04/24/19 0756 04/24/19 0800  BP: 131/71 108/67 123/74   Pulse: 80 (!) 108 80 72  Resp: 16 16 18 18   Temp: 98.9 F (37.2 C) 99 F (37.2 C) 98 F (36.7 C)   TempSrc: Oral Oral Oral   SpO2: 97% (!) 79% 96% 96%  Weight:      Height:        Wt Readings from Last 3 Encounters:  04/21/19 93.2 kg  04/19/19 93.4 kg  08/03/18 98.4 kg     Intake/Output Summary (Last 24 hours) at 04/24/2019 1415 Last data filed at 04/24/2019 0552 Gross per 24 hour  Intake 532.19 ml  Output -  Net 532.19 ml     Physical  Exam Gen Exam:Alert awake-not in any distress HEENT:atraumatic, normocephalic Chest: B/L clear to auscultation anteriorly CVS:S1S2 regular Abdomen:soft non tender, non distended Extremities:no edema Neurology: Non focal Skin: no rash   Data Review:    CBC Recent Labs  Lab 04/20/19 1605 04/21/19 0730 04/22/19 0524 04/23/19 0120 04/24/19 0145  WBC  5.3 4.8 4.9 5.3 5.5  HGB 14.9 13.4 13.1 13.3 13.4  HCT 45.4 42.0 41.7 42.8 42.4  PLT 372 314 339 358 325  MCV 83.9 84.8 85.1 86.3 85.8  MCH 27.5 27.1 26.7 26.8 27.1  MCHC 32.8 31.9 31.4 31.1 31.6  RDW 14.1 14.1 14.0 14.2 14.4  LYMPHSABS 1.8 1.4 1.9 2.6 2.5  MONOABS 0.5 0.6 0.8 0.6 0.7  EOSABS 0.0 0.0 0.0 0.0 0.1  BASOSABS 0.0 0.0 0.0 0.0 0.0    Chemistries  Recent Labs  Lab 04/20/19 1552 04/20/19 1605 04/21/19 0730 04/22/19 0524 04/23/19 0120 04/24/19 0145  NA  --  129* 134* 136 138 134*  K  --  3.5 3.7 3.2* 3.6 3.4*  CL  --  93* 97* 102 102 100  CO2  --  21* 27 26 26 23   GLUCOSE  --  99 91 88 90 81  BUN  --  11 13 12 14 12   CREATININE  --  1.21 1.28* 0.95 0.92 0.86  CALCIUM  --  8.6* 8.0* 8.0* 8.3* 8.9  MG 2.2  --   --   --   --   --   AST  --  71* 60* 63* 67* 79*  ALT  --  51* 46* 46* 50* 61*  ALKPHOS  --  86 75 64 75 76  BILITOT  --  1.1 1.4* 0.8 0.6 0.3   ------------------------------------------------------------------------------------------------------------------ No results for input(s): CHOL, HDL, LDLCALC, TRIG, CHOLHDL, LDLDIRECT in the last 72 hours.  No results found for: HGBA1C ------------------------------------------------------------------------------------------------------------------ No results for input(s): TSH, T4TOTAL, T3FREE, THYROIDAB in the last 72 hours.  Invalid input(s): FREET3 ------------------------------------------------------------------------------------------------------------------ No results for input(s): VITAMINB12, FOLATE, FERRITIN, TIBC, IRON, RETICCTPCT in the last 72 hours.  Coagulation profile Recent Labs  Lab 04/19/19 0829  INR 0.9    No results for input(s): DDIMER in the last 72 hours.  Cardiac Enzymes No results for input(s): CKMB, TROPONINI, MYOGLOBIN in the last 168 hours.  Invalid input(s):  CK ------------------------------------------------------------------------------------------------------------------ No results found for: BNP  Micro Results Recent Results (from the past 240 hour(s))  SARS CORONAVIRUS 2 (TAT 6-24 HRS) Nasopharyngeal Nasopharyngeal Swab     Status: Abnormal   Collection Time: 04/19/19  3:50 PM   Specimen: Nasopharyngeal Swab  Result Value Ref Range Status   SARS Coronavirus 2 POSITIVE (A) NEGATIVE Final    Comment: EMAILED TO [id number] 04/19/2019 [place] K (NOTE) SARS-CoV-2 target nucleic acids are DETECTED. The SARS-CoV-2 RNA is generally detectable in upper and lower respiratory specimens during the acute phase of infection. Positive results are indicative of active infection with SARS-CoV-2. Clinical  correlation with patient history and other diagnostic information is necessary to determine patient infection status. Positive results do  not rule out bacterial infection or co-infection with other viruses. The expected result is Negative. Fact Sheet for Patients: SugarRoll.be Fact Sheet for Healthcare Providers: https://www.woods-mathews.com/ This test is not yet approved or cleared by the [place] FDA and  has been authorized for detection and/or diagnosis of SARS-CoV-2 by FDA under an Emergency Use Authorization (EUA). This EUA will remain  in effect (meaning this test can be used) for the duration  of the COVID-19 declara tion under Section 564(b)(1) of the Act, 21 U.S.C. section 360bbb-3(b)(1), unless the authorization is terminated or revoked sooner. Performed at [facility], [place] [address], [place], [place] [address]     Radiology Reports Ct Angio Head W Or Wo Contrast  Result Date: 04/19/2019 CLINICAL DATA:  Dizziness. Body aches. EXAM: CT ANGIOGRAPHY HEAD AND NECK TECHNIQUE: Multidetector CT imaging of the head and neck was performed using the standard protocol during  bolus administration of intravenous contrast. Multiplanar CT image reconstructions and MIPs were obtained to evaluate the vascular anatomy. Carotid stenosis measurements (when applicable) are obtained utilizing NASCET criteria, using the distal internal carotid diameter as the denominator. CONTRAST:  75 mL Omnipaque 350 COMPARISON:  None. FINDINGS: CT HEAD FINDINGS Brain: There is no evidence of acute infarct, intracranial hemorrhage, mass, midline shift, or extra-axial fluid collection. The ventricles and sulci are within normal limits for age. Vascular: Calcified atherosclerosis at the skull base. Skull: No fracture or focal osseous lesion. Sinuses: Mild mucosal thickening in the paranasal sinuses. Clear mastoid air cells. Orbits: Bilateral cataract extraction. Review of the MIP images confirms the above findings CTA NECK FINDINGS Aortic arch: Normal variant aortic arch branching pattern with common origin of the brachiocephalic and left common carotid arteries. Mild arch atherosclerosis without arch vessel origin stenosis. Right carotid system: Patent with minimal calcified plaque at the carotid bifurcation. No evidence of dissection or stenosis. Tortuous distal cervical ICA. Left carotid system: Patent without evidence of dissection or stenosis. Tortuous distal cervical ICA. Vertebral arteries: Patent with the left being strongly dominant. No evidence of dissection or stenosis. Skeleton: Advanced cervical disc degeneration with mild multilevel listhesis. Other neck: No evidence of cervical lymphadenopathy or mass. Upper chest: Mild centrilobular emphysema. Review of the MIP images confirms the above findings CTA HEAD FINDINGS Anterior circulation: The internal carotid arteries are patent from skull base to carotid termini with minimal nonstenotic atherosclerosis. ACAs and MCAs are patent with mild branch vessel irregularity but no evidence of proximal branch occlusion or significant proximal stenosis. No  aneurysm is identified. Posterior circulation: The intracranial vertebral arteries are widely patent to the basilar. Patent PICA, AICA, and SCA origins are seen bilaterally. The basilar artery is widely patent. There are left larger than right posterior communicating arteries. The PCAs are patent with mild irregularity but no significant proximal stenosis. No aneurysm is identified. Venous sinuses: As permitted by contrast timing, patent. Anatomic variants: None. Review of the MIP images confirms the above findings IMPRESSION: 1. Unremarkable CT appearance of the brain. No evidence of acute intracranial abnormality. 2. Minimal atherosclerosis in the head and neck without large vessel occlusion or significant stenosis. 3.  Aortic Atherosclerosis (ICD10-I70.0). Electronically Signed   By: Logan Bores M.D.   On: 04/19/2019 12:13   Ct Angio Neck W And/or Wo Contrast  Result Date: 04/19/2019 CLINICAL DATA:  Dizziness. Body aches. EXAM: CT ANGIOGRAPHY HEAD AND NECK TECHNIQUE: Multidetector CT imaging of the head and neck was performed using the standard protocol during bolus administration of intravenous contrast. Multiplanar CT image reconstructions and MIPs were obtained to evaluate the vascular anatomy. Carotid stenosis measurements (when applicable) are obtained utilizing NASCET criteria, using the distal internal carotid diameter as the denominator. CONTRAST:  75 mL Omnipaque 350 COMPARISON:  None. FINDINGS: CT HEAD FINDINGS Brain: There is no evidence of acute infarct, intracranial hemorrhage, mass, midline shift, or extra-axial fluid collection. The ventricles and sulci are within normal limits for age. Vascular: Calcified atherosclerosis at  the skull base. Skull: No fracture or focal osseous lesion. Sinuses: Mild mucosal thickening in the paranasal sinuses. Clear mastoid air cells. Orbits: Bilateral cataract extraction. Review of the MIP images confirms the above findings CTA NECK FINDINGS Aortic arch: Normal  variant aortic arch branching pattern with common origin of the brachiocephalic and left common carotid arteries. Mild arch atherosclerosis without arch vessel origin stenosis. Right carotid system: Patent with minimal calcified plaque at the carotid bifurcation. No evidence of dissection or stenosis. Tortuous distal cervical ICA. Left carotid system: Patent without evidence of dissection or stenosis. Tortuous distal cervical ICA. Vertebral arteries: Patent with the left being strongly dominant. No evidence of dissection or stenosis. Skeleton: Advanced cervical disc degeneration with mild multilevel listhesis. Other neck: No evidence of cervical lymphadenopathy or mass. Upper chest: Mild centrilobular emphysema. Review of the MIP images confirms the above findings CTA HEAD FINDINGS Anterior circulation: The internal carotid arteries are patent from skull base to carotid termini with minimal nonstenotic atherosclerosis. ACAs and MCAs are patent with mild branch vessel irregularity but no evidence of proximal branch occlusion or significant proximal stenosis. No aneurysm is identified. Posterior circulation: The intracranial vertebral arteries are widely patent to the basilar. Patent PICA, AICA, and SCA origins are seen bilaterally. The basilar artery is widely patent. There are left larger than right posterior communicating arteries. The PCAs are patent with mild irregularity but no significant proximal stenosis. No aneurysm is identified. Venous sinuses: As permitted by contrast timing, patent. Anatomic variants: None. Review of the MIP images confirms the above findings IMPRESSION: 1. Unremarkable CT appearance of the brain. No evidence of acute intracranial abnormality. 2. Minimal atherosclerosis in the head and neck without large vessel occlusion or significant stenosis. 3.  Aortic Atherosclerosis (ICD10-I70.0). Electronically Signed   By: Logan Bores M.D.   On: 04/19/2019 12:13   Ct Abdomen Pelvis W  Contrast  Result Date: 04/19/2019 CLINICAL DATA:  Dizziness body aches. EXAM: CT ABDOMEN AND PELVIS WITH CONTRAST TECHNIQUE: Multidetector CT imaging of the abdomen and pelvis was performed using the standard protocol following bolus administration of intravenous contrast. CONTRAST:  148m OMNIPAQUE IOHEXOL 300 MG/ML  SOLN COMPARISON:  07/28/2017 and 06/28/2013 FINDINGS: Lower chest: Subtle ground-glass and interstitial thickening noted at the right lung base not present in 2019. No signs of effusion or basilar consolidation. Hepatobiliary: Caudate cyst. No suspicious focal lesion in the liver. Pancreas: Unremarkable. No pancreatic ductal dilatation or surrounding inflammatory changes. Spleen: Normal in size without focal abnormality. Adrenals/Urinary Tract: Normal appearance of bilateral adrenal glands. Symmetric enhancement of the bilateral kidneys. Small low-density renal lesions likely cysts. No signs of hydronephrosis. Symmetric renal enhancement. Stomach/Bowel: No signs of bowel obstruction or acute bowel process. Post subtotal colectomy with ileal rectal anastomosis. Signs of mural stratification of the rectum with perirectal lymph nodes and high superior rectal lymph nodes unchanged from previous exam. No signs of bowel obstruction. Vascular/Lymphatic: Vascular structures are patent. There is atherosclerotic change within the abdominal aorta tracking in the iliac vessels. No signs of abdominal or pelvic lymphadenopathy aside from mildly enlarged but stable nodes in the rectal mesentery and low abdomen. Reproductive: Prostate heterogeneity, nonspecific by CT. Urinary bladder under distended but otherwise unremarkable. Other: Moderate fat containing right inguinal hernia. No signs of free air. No signs of ascites. Musculoskeletal: Extensive spinal degenerative changes without acute or destructive bone process. IMPRESSION: 1. Signs of mural stratification of the rectum with perirectal lymph nodes and high  superior rectal lymph nodes are unchanged from previous  exam. Compatible with proctitis. 2. Ground-glass and interstitial thickening at the right lung base may represent pneumonitis. Consider follow-up to ensure resolution at 3 month interval, signs of mild parenchymal distortion and subpleural reticulation also raising the question of early interstitial lung disease. This is new compared to the study of 07/28/2017. 3. Atherosclerotic change in the abdominal aorta and iliac vessels. Aortic Atherosclerosis (ICD10-I70.0). Electronically Signed   By: Zetta Bills M.D.   On: 04/19/2019 11:55   Dg Chest Port 1 View  Result Date: 04/20/2019 CLINICAL DATA:  Covid positive EXAM: PORTABLE CHEST 1 VIEW COMPARISON:  February 27, 2014 FINDINGS: The heart size and mediastinal contours are within normal limits. There is subtle patchy airspace opacity seen at the right lung base. The left lung is clear. No pleural effusion. No acute osseous abnormality. IMPRESSION: Patchy airspace opacity at the right lung base. The findings in the lungs are nonspecific, but concerning for atypical infection, which includes viral pneumonia. Electronically Signed   By: Prudencio Pair M.D.   On: 04/20/2019 19:29

## 2019-04-25 DIAGNOSIS — J1289 Other viral pneumonia: Secondary | ICD-10-CM

## 2019-04-25 LAB — CBC WITH DIFFERENTIAL/PLATELET
Abs Immature Granulocytes: 0.05 10*3/uL (ref 0.00–0.07)
Basophils Absolute: 0.1 10*3/uL (ref 0.0–0.1)
Basophils Relative: 1 %
Eosinophils Absolute: 0.1 10*3/uL (ref 0.0–0.5)
Eosinophils Relative: 2 %
HCT: 41.6 % (ref 39.0–52.0)
Hemoglobin: 13.3 g/dL (ref 13.0–17.0)
Immature Granulocytes: 1 %
Lymphocytes Relative: 43 %
Lymphs Abs: 3.1 10*3/uL (ref 0.7–4.0)
MCH: 27.1 pg (ref 26.0–34.0)
MCHC: 32 g/dL (ref 30.0–36.0)
MCV: 84.9 fL (ref 80.0–100.0)
Monocytes Absolute: 1 10*3/uL (ref 0.1–1.0)
Monocytes Relative: 14 %
Neutro Abs: 2.8 10*3/uL (ref 1.7–7.7)
Neutrophils Relative %: 39 %
Platelets: 414 10*3/uL — ABNORMAL HIGH (ref 150–400)
RBC: 4.9 MIL/uL (ref 4.22–5.81)
RDW: 14.1 % (ref 11.5–15.5)
WBC: 7.1 10*3/uL (ref 4.0–10.5)
nRBC: 0 % (ref 0.0–0.2)

## 2019-04-25 LAB — COMPREHENSIVE METABOLIC PANEL
ALT: 67 U/L — ABNORMAL HIGH (ref 0–44)
AST: 67 U/L — ABNORMAL HIGH (ref 15–41)
Albumin: 3.3 g/dL — ABNORMAL LOW (ref 3.5–5.0)
Alkaline Phosphatase: 75 U/L (ref 38–126)
Anion gap: 10 (ref 5–15)
BUN: 14 mg/dL (ref 8–23)
CO2: 27 mmol/L (ref 22–32)
Calcium: 8.9 mg/dL (ref 8.9–10.3)
Chloride: 97 mmol/L — ABNORMAL LOW (ref 98–111)
Creatinine, Ser: 1.03 mg/dL (ref 0.61–1.24)
GFR calc Af Amer: >60 mL/min (ref >60)
GFR calc non Af Amer: >60 mL/min (ref >60)
Glucose, Bld: 104 mg/dL — ABNORMAL HIGH (ref 70–99)
Potassium: 3.4 mmol/L — ABNORMAL LOW (ref 3.5–5.1)
Sodium: 134 mmol/L — ABNORMAL LOW (ref 135–145)
Total Bilirubin: 0.6 mg/dL (ref 0.3–1.2)
Total Protein: 7.2 g/dL (ref 6.5–8.1)

## 2019-04-25 LAB — C-REACTIVE PROTEIN: CRP: 5.4 mg/dL — ABNORMAL HIGH (ref <1.0)

## 2019-04-25 MED ORDER — AMLODIPINE BESYLATE 5 MG PO TABS: 5.0000 mg | ORAL_TABLET | Freq: Every day | ORAL | 0 refills | Status: DC

## 2019-04-25 MED ORDER — POTASSIUM CHLORIDE CRYS ER 20 MEQ PO TBCR
40.0000 meq | EXTENDED_RELEASE_TABLET | Freq: Once | ORAL | Status: AC
  Administered 2019-04-25: 40 meq via ORAL
  Filled 2019-04-25: qty 2

## 2019-04-25 NOTE — Care Management Important Message (Signed)
Important Message  Patient Details  Name: Robert Rhodes MRN: [id number] Date of Birth: 10/24/1945   Medicare Important Message Given:  Yes - Important Message mailed due to current National Emergency  Verbal consent obtained due to current National Emergency  Relationship to patient: Brother/Sister Contact Name: [name] Call Date: 04/25/19  Time: 1442 Phone: [phone] Outcome: Spoke with contact Important Message mailed to: Patient address on file    Delorse Lek 04/25/2019, 2:42 PM

## 2019-04-25 NOTE — Progress Notes (Signed)
Patient ambulated in room independently. Encouragement and education provided on IS and flutter valve use. Patient would only use IS after encouragement. Will continue to monitor.

## 2019-04-25 NOTE — Discharge Instructions (Signed)
Person Under Monitoring Name: Robert Rhodes  Location: [facility] [place] [address] [place] [address]   Infection Prevention Recommendations for Individuals Confirmed to have, or Being Evaluated for, 2019 Novel Coronavirus (COVID-19) Infection Who Receive Care at Home  Individuals who are confirmed to have, or are being evaluated for, COVID-19 should follow the prevention steps below until a healthcare provider or local or state health department says they can return to normal activities.  Stay home except to get medical care You should restrict activities outside your home, except for getting medical care. Do not go to work, school, or public areas, and do not use public transportation or taxis.  Call ahead before visiting your doctor Before your medical appointment, call the healthcare provider and tell them that you have, or are being evaluated for, COVID-19 infection. This will help the healthcare providers office take steps to keep other people from getting infected. Ask your healthcare provider to call the local or state health department.  Monitor your symptoms Seek prompt medical attention if your illness is worsening (e.g., difficulty breathing). Before going to your medical appointment, call the healthcare provider and tell them that you have, or are being evaluated for, COVID-19 infection. Ask your healthcare provider to call the local or state health department.  Wear a facemask You should wear a facemask that covers your nose and mouth when you are in the same room with other people and when you visit a healthcare provider. People who live with or visit you should also wear a facemask while they are in the same room with you.  Separate yourself from other people in your home As much as possible, you should stay in a different room from other people in your home. Also, you should use a separate bathroom, if available.  Avoid sharing household items You should not  share dishes, drinking glasses, cups, eating utensils, towels, bedding, or other items with other people in your home. After using these items, you should wash them thoroughly with soap and water.  Cover your coughs and sneezes Cover your mouth and nose with a tissue when you cough or sneeze, or you can cough or sneeze into your sleeve. Throw used tissues in a lined trash can, and immediately wash your hands with soap and water for at least 20 seconds or use an alcohol-based hand rub.  Wash your [facility] your hands often and thoroughly with soap and water for at least 20 seconds. You can use an alcohol-based hand sanitizer if soap and water are not available and if your hands are not visibly dirty. Avoid touching your eyes, nose, and mouth with unwashed hands.   Prevention Steps for Caregivers and Household Members of Individuals Confirmed to have, or Being Evaluated for, COVID-19 Infection Being Cared for in the Home  If you live with, or provide care at home for, a person confirmed to have, or being evaluated for, COVID-19 infection please follow these guidelines to prevent infection:  Follow healthcare providers instructions Make sure that you understand and can help the patient follow any healthcare provider instructions for all care.  Provide for the patients basic needs You should help the patient with basic needs in the home and provide support for getting groceries, prescriptions, and other personal needs.  Monitor the patients symptoms If they are getting sicker, call his or her medical provider and tell them that the patient has, or is being evaluated for, COVID-19 infection. This will help the healthcare providers  office take steps to keep other people from getting infected. Ask the healthcare provider to call the local or state health department.  Limit the number of people who have contact with the patient  If possible, have only one caregiver for the  patient.  Other household members should stay in another home or place of residence. If this is not possible, they should stay  in another room, or be separated from the patient as much as possible. Use a separate bathroom, if available.  Restrict visitors who do not have an essential need to be in the home.  Keep older adults, very young children, and other sick people away from the patient Keep older adults, very young children, and those who have compromised immune systems or chronic health conditions away from the patient. This includes people with chronic heart, lung, or kidney conditions, diabetes, and cancer.  Ensure good ventilation Make sure that shared spaces in the home have good air flow, such as from an air conditioner or an opened window, weather permitting.  Wash your hands often  Wash your hands often and thoroughly with soap and water for at least 20 seconds. You can use an alcohol based hand sanitizer if soap and water are not available and if your hands are not visibly dirty.  Avoid touching your eyes, nose, and mouth with unwashed hands.  Use disposable paper towels to dry your hands. If not available, use dedicated cloth towels and replace them when they become wet.  Wear a facemask and gloves  Wear a disposable facemask at all times in the room and gloves when you touch or have contact with the patients blood, body fluids, and/or secretions or excretions, such as sweat, saliva, sputum, nasal mucus, vomit, urine, or feces.  Ensure the mask fits over your nose and mouth tightly, and do not touch it during use.  Throw out disposable facemasks and gloves after using them. Do not reuse.  Wash your hands immediately after removing your facemask and gloves.  If your personal clothing becomes contaminated, carefully remove clothing and launder. Wash your hands after handling contaminated clothing.  Place all used disposable facemasks, gloves, and other waste in a lined  container before disposing them with other household waste.  Remove gloves and wash your hands immediately after handling these items.  Do not share dishes, glasses, or other household items with the patient  Avoid sharing household items. You should not share dishes, drinking glasses, cups, eating utensils, towels, bedding, or other items with a patient who is confirmed to have, or being evaluated for, COVID-19 infection.  After the person uses these items, you should wash them thoroughly with soap and water.  Wash laundry thoroughly  Immediately remove and wash clothes or bedding that have blood, body fluids, and/or secretions or excretions, such as sweat, saliva, sputum, nasal mucus, vomit, urine, or feces, on them.  Wear gloves when handling laundry from the patient.  Read and follow directions on labels of laundry or clothing items and detergent. In general, wash and dry with the warmest temperatures recommended on the label.  Clean all areas the individual has used often  Clean all touchable surfaces, such as counters, tabletops, doorknobs, bathroom fixtures, toilets, phones, keyboards, tablets, and bedside tables, every day. Also, clean any surfaces that may have blood, body fluids, and/or secretions or excretions on them.  Wear gloves when cleaning surfaces the patient has come in contact with.  Use a diluted bleach solution (e.g., dilute bleach with 1  part bleach and 10 parts water) or a household disinfectant with a label that says EPA-registered for coronaviruses. To make a bleach solution at home, add 1 tablespoon of bleach to 1 quart (4 cups) of water. For a larger supply, add  cup of bleach to 1 gallon (16 cups) of water.  Read labels of cleaning products and follow recommendations provided on product labels. Labels contain instructions for safe and effective use of the cleaning product including precautions you should take when applying the product, such as wearing gloves or  eye protection and making sure you have good ventilation during use of the product.  Remove gloves and wash hands immediately after cleaning.  Monitor yourself for signs and symptoms of illness Caregivers and household members are considered close contacts, should monitor their health, and will be asked to limit movement outside of the home to the extent possible. Follow the monitoring steps for close contacts listed on the symptom monitoring form.   ? If you have additional questions, contact your local health department or call the epidemiologist on call at [phone] (available 24/7). ? This guidance is subject to change. For the most up-to-date guidance from [facility], please refer to their website: [email or web address]

## 2019-04-25 NOTE — Plan of Care (Signed)
Arlyss Repress , RN

## 2019-04-25 NOTE — Discharge Summary (Signed)
PATIENT DETAILS Name: Robert Rhodes Age: 73 y.o. Sex: male Date of Birth: 1946/01/13 MRN: [id number]. Admitting Physician: Nita Sells, MD [id number], Charlane Ferretti, MD  Admit Date: 04/20/2019 Discharge date: 04/25/2019  Recommendations for Outpatient Follow-up:  1. Follow up with PCP in 1-2 weeks 2. Please obtain CMP/CBC in one week 3. Repeat Chest Xray in 4-6 week  Admitted From:  Home  Disposition: [place]: No  Equipment/Devices: None  Discharge Condition: Stable  CODE STATUS: FULL CODE  Diet recommendation:  Diet Order            Diet - low sodium heart healthy        Diet vegetarian Room service appropriate? Yes; Fluid consistency: Thin  Diet effective now               Brief Summary: See H&P, Labs, Consult and Test reports for all details in brief, Patient is a 73 y.o. male with PMHx of colon cancer-s/p ileorectal anastomosis, ulcerative colitis, HTN-presented with diarrhea, dizziness on 11/6-he was diagnosed with COVID-19 but subsequently left the emergency room AMA-pesented back to the ED with worsening diarrhea on 11/18-upon further evaluation-he was found to have bilateral groundglass opacities in his lungs on imaging.  He was then transferred to the hospitalist service at [facility] for further evaluation and treatment.  See below for further details.   Brief Hospital Course: COVID-19 pneumonitis and gastroenteritis: Improved-no further diarrhea-he is on room air-treated with IV remdesivir-last dose on 11/12-following which he can go home. He remains on room air for the past few days  COVID-19 Labs:  Recent Labs    04/23/19 0120 04/24/19 0145 04/25/19 0020  CRP 4.6* 4.2* 5.4*    Lab Results  Component Value Date   SARSCOV2NAA POSITIVE (A) 04/19/2019     COVID-19 Medications: Steroids: Not given due to lack of hypoxemia Remdesivir: 11/8>> Actemra: Not indicated Convalescent Plasma: Not indicated Research  Studies:N/A  AKI: Hemodynamically mediated-resolved.  Hyponatremia: Secondary to dehydration-resolved.  Orthostatic hypotension: Secondary to volume loss from diarrhea-resolved.  He has ambulated in the room without any issues.  HTN: Controlled-change to amlodipine-given persistent hypokalemia-hence will stop HCTZ  Hypokalemia: Repleted-recheck at follow up  History of ulcerative colitis: Continue mesalamine  Obesity: Estimated body mass index is 30.34 kg/m as calculated from the following:   Height as of this encounter: 5' 9"  (1.753 m).   Weight as of this encounter: 93.2 kg.   RN pressure injury documentation: Pressure Ulcer 08/23/14 Stage II -  Partial thickness loss of dermis presenting as a shallow open ulcer with a red, pink wound bed without slough. (Active)  08/23/14 2200  Location: Buttocks  Location Orientation: Right  Staging: Stage II -  Partial thickness loss of dermis presenting as a shallow open ulcer with a red, pink wound bed without slough.  Wound Description (Comments):   Present on Admission: Yes    Procedures/Studies: None  Discharge Diagnoses:  Active Problems:   Diarrhea   Bipolar 1 disorder, mixed, moderate (HCC)   Essential hypertension, benign   AKI (acute kidney injury) ([place])   Pneumonia due to COVID-19 virus   Discharge Instructions:    Person Under Monitoring Name: Robert Rhodes  Location: [address]  [place] [address]   Infection Prevention Recommendations for Individuals Confirmed to have, or Being Evaluated for, 2019 Novel Coronavirus (COVID-19) Infection Who Receive Care at Home  Individuals who are confirmed to have, or are being evaluated for, COVID-19 should follow  the prevention steps below until a healthcare provider or local or state health department says they can return to normal activities.  Stay home except to get medical care You should restrict activities outside your home, except for getting  medical care. Do not go to work, school, or public areas, and do not use public transportation or taxis.  Call ahead before visiting your doctor Before your medical appointment, call the healthcare provider and tell them that you have, or are being evaluated for, COVID-19 infection. This will help the healthcare provider's office take steps to keep other people from getting infected. Ask your healthcare provider to call the local or state health department.  Monitor your symptoms Seek prompt medical attention if your illness is worsening (e.g., difficulty breathing). Before going to your medical appointment, call the healthcare provider and tell them that you have, or are being evaluated for, COVID-19 infection. Ask your healthcare provider to call the local or state health department.  Wear a facemask You should wear a facemask that covers your nose and mouth when you are in the same room with other people and when you visit a healthcare provider. People who live with or visit you should also wear a facemask while they are in the same room with you.  Separate yourself from other people in your home As much as possible, you should stay in a different room from other people in your home. Also, you should use a separate bathroom, if available.  Avoid sharing household items You should not share dishes, drinking glasses, cups, eating utensils, towels, bedding, or other items with other people in your home. After using these items, you should wash them thoroughly with soap and water.  Cover your coughs and sneezes Cover your mouth and nose with a tissue when you cough or sneeze, or you can cough or sneeze into your sleeve. Throw used tissues in a lined trash can, and immediately wash your hands with soap and water for at least 20 seconds or use an alcohol-based hand rub.  Wash your [facility] your hands often and thoroughly with soap and water for at least 20 seconds. You can use an  alcohol-based hand sanitizer if soap and water are not available and if your hands are not visibly dirty. Avoid touching your eyes, nose, and mouth with unwashed hands.   Prevention Steps for Caregivers and Household Members of Individuals Confirmed to have, or Being Evaluated for, COVID-19 Infection Being Cared for in the Home  If you live with, or provide care at home for, a person confirmed to have, or being evaluated for, COVID-19 infection please follow these guidelines to prevent infection:  Follow healthcare provider's instructions Make sure that you understand and can help the patient follow any healthcare provider instructions for all care.  Provide for the patient's basic needs You should help the patient with basic needs in the home and provide support for getting groceries, prescriptions, and other personal needs.  Monitor the patient's symptoms If they are getting sicker, call his or her medical provider and tell them that the patient has, or is being evaluated for, COVID-19 infection. This will help the healthcare provider's office take steps to keep other people from getting infected. Ask the healthcare provider to call the local or state health department.  Limit the number of people who have contact with the patient  If possible, have only one caregiver for the patient.  Other household members should stay in another home or place of  residence. If this is not possible, they should stay  in another room, or be separated from the patient as much as possible. Use a separate bathroom, if available.  Restrict visitors who do not have an essential need to be in the home.  Keep older adults, very young children, and other sick people away from the patient Keep older adults, very young children, and those who have compromised immune systems or chronic health conditions away from the patient. This includes people with chronic heart, lung, or kidney conditions, diabetes, and  cancer.  Ensure good ventilation Make sure that shared spaces in the home have good air flow, such as from an air conditioner or an opened window, weather permitting.  Wash your hands often  Wash your hands often and thoroughly with soap and water for at least 20 seconds. You can use an alcohol based hand sanitizer if soap and water are not available and if your hands are not visibly dirty.  Avoid touching your eyes, nose, and mouth with unwashed hands.  Use disposable paper towels to dry your hands. If not available, use dedicated cloth towels and replace them when they become wet.  Wear a facemask and gloves  Wear a disposable facemask at all times in the room and gloves when you touch or have contact with the patient's blood, body fluids, and/or secretions or excretions, such as sweat, saliva, sputum, nasal mucus, vomit, urine, or feces.  Ensure the mask fits over your nose and mouth tightly, and do not touch it during use.  Throw out disposable facemasks and gloves after using them. Do not reuse.  Wash your hands immediately after removing your facemask and gloves.  If your personal clothing becomes contaminated, carefully remove clothing and launder. Wash your hands after handling contaminated clothing.  Place all used disposable facemasks, gloves, and other waste in a lined container before disposing them with other household waste.  Remove gloves and wash your hands immediately after handling these items.  Do not share dishes, glasses, or other household items with the patient  Avoid sharing household items. You should not share dishes, drinking glasses, cups, eating utensils, towels, bedding, or other items with a patient who is confirmed to have, or being evaluated for, COVID-19 infection.  After the person uses these items, you should wash them thoroughly with soap and water.  Wash laundry thoroughly  Immediately remove and wash clothes or bedding that have blood, body  fluids, and/or secretions or excretions, such as sweat, saliva, sputum, nasal mucus, vomit, urine, or feces, on them.  Wear gloves when handling laundry from the patient.  Read and follow directions on labels of laundry or clothing items and detergent. In general, wash and dry with the warmest temperatures recommended on the label.  Clean all areas the individual has used often  Clean all touchable surfaces, such as counters, tabletops, doorknobs, bathroom fixtures, toilets, phones, keyboards, tablets, and bedside tables, every day. Also, clean any surfaces that may have blood, body fluids, and/or secretions or excretions on them.  Wear gloves when cleaning surfaces the patient has come in contact with.  Use a diluted bleach solution (e.g., dilute bleach with 1 part bleach and 10 parts water) or a household disinfectant with a label that says EPA-registered for coronaviruses. To make a bleach solution at home, add 1 tablespoon of bleach to 1 quart (4 cups) of water. For a larger supply, add  cup of bleach to 1 gallon (16 cups) of water.  Read labels  of cleaning products and follow recommendations provided on product labels. Labels contain instructions for safe and effective use of the cleaning product including precautions you should take when applying the product, such as wearing gloves or eye protection and making sure you have good ventilation during use of the product.  Remove gloves and wash hands immediately after cleaning.  Monitor yourself for signs and symptoms of illness Caregivers and household members are considered close contacts, should monitor their health, and will be asked to limit movement outside of the home to the extent possible. Follow the monitoring steps for close contacts listed on the symptom monitoring form.   ? If you have additional questions, contact your local health department or call the epidemiologist on call at [phone] (available 24/7). ? This  guidance is subject to change. For the most up-to-date guidance from CDC, please refer to their website: YouBlogs.pl    Activity:  As tolerated  Discharge Instructions    Call MD for:  difficulty breathing, headache or visual disturbances   Complete by: As directed    Call MD for:  persistant nausea and vomiting   Complete by: As directed    Diet - low sodium heart healthy   Complete by: As directed    Discharge instructions   Complete by: As directed    Follow with Primary MD  [name], MD in 1-2 weeks  Follow with GI MD Dr Hilarie Fredrickson in 2 weeks  Please get a complete blood count and chemistry panel checked by your Primary MD at your next visit, and again as instructed by your Primary MD.  Get Medicines reviewed and adjusted: Please take all your medications with you for your next visit with your Primary MD  Laboratory/radiological data: Please request your Primary MD to go over all hospital tests and procedure/radiological results at the follow up, please ask your Primary MD to get all Hospital records sent to his/her office.  In some cases, they will be blood work, cultures and biopsy results pending at the time of your discharge. Please request that your primary care M.D. follows up on these results.  Also Note the following: If you experience worsening of your admission symptoms, develop shortness of breath, life threatening emergency, suicidal or homicidal thoughts you must seek medical attention immediately by calling 911 or calling your MD immediately  if symptoms less severe.  You must read complete instructions/literature along with all the possible adverse reactions/side effects for all the Medicines you take and that have been prescribed to you. Take any new Medicines after you have completely understood and accpet all the possible adverse reactions/side effects.   Do not drive when taking Pain medications  or sleeping medications (Benzodaizepines)  Do not take more than prescribed Pain, Sleep and Anxiety Medications. It is not advisable to combine anxiety,sleep and pain medications without talking with your primary care practitioner  Special Instructions: If you have smoked or chewed Tobacco  in the last 2 yrs please stop smoking, stop any regular Alcohol  and or any Recreational drug use.  Wear Seat belts while driving.  Please note: You were cared for by a hospitalist during your hospital stay. Once you are discharged, your primary care physician will handle any further medical issues. Please note that NO REFILLS for any discharge medications will be authorized once you are discharged, as it is imperative that you return to your primary care physician (or establish a relationship with a primary care physician if you do not have one)  for your post hospital discharge needs so that they can reassess your need for medications and monitor your lab values.   Increase activity slowly   Complete by: As directed      Allergies as of 04/25/2019      Reactions   Ace Inhibitors Other (See Comments)   Unknown reaction   Advil [ibuprofen] Other (See Comments)   Muscle tightness   Aspirin Nausea And Vomiting   Tylenol [acetaminophen] Other (See Comments)   Muscle tightness      Medication List    STOP taking these medications   hydrochlorothiazide 25 MG tablet Commonly known as: HYDRODIURIL     TAKE these medications   Aleve 220 MG Caps Generic drug: Naproxen Sodium Take 440 mg by mouth 2 (two) times daily as needed (arthritis pain).   amLODipine 5 MG tablet Commonly known as: NORVASC Take 1 tablet (5 mg total) by mouth daily.   mesalamine 1000 MG suppository Commonly known as: CANASA Place 1 suppository (1,000 mg total) rectally at bedtime.   Misc. Devices Misc Blood pressure machine.  Diagnosis-hypertension   multivitamin with minerals Tabs tablet Take 1 tablet by mouth daily at  12 noon. What changed: when to take this   Omega 3 1200 MG Caps Take 1,200 mg by mouth daily.      Follow-up Information    [name], MD. Schedule an appointment as soon as possible for a visit in 2 week(s).   Specialty: Family Medicine Contact information: [place] [address] [phone]        Jerene Bears, MD Follow up in 2 week(s).   Specialty: Gastroenterology Contact information: [address] [place] [address] [phone]          Allergies  Allergen Reactions  . Ace Inhibitors Other (See Comments)    Unknown reaction  . Advil [Ibuprofen] Other (See Comments)    Muscle tightness  . Aspirin Nausea And Vomiting  . Tylenol [Acetaminophen] Other (See Comments)    Muscle tightness    Consultations:   None   Other Procedures/Studies: Ct Angio Head W Or Wo Contrast  Result Date: 04/19/2019 CLINICAL DATA:  Dizziness. Body aches. EXAM: CT ANGIOGRAPHY HEAD AND NECK TECHNIQUE: Multidetector CT imaging of the head and neck was performed using the standard protocol during bolus administration of intravenous contrast. Multiplanar CT image reconstructions and MIPs were obtained to evaluate the vascular anatomy. Carotid stenosis measurements (when applicable) are obtained utilizing NASCET criteria, using the distal internal carotid diameter as the denominator. CONTRAST:  75 mL Omnipaque 350 COMPARISON:  None. FINDINGS: CT HEAD FINDINGS Brain: There is no evidence of acute infarct, intracranial hemorrhage, mass, midline shift, or extra-axial fluid collection. The ventricles and sulci are within normal limits for age. Vascular: Calcified atherosclerosis at the skull base. Skull: No fracture or focal osseous lesion. Sinuses: Mild mucosal thickening in the paranasal sinuses. Clear mastoid air cells. Orbits: Bilateral cataract extraction. Review of the MIP images confirms the above findings CTA NECK FINDINGS Aortic arch: Normal variant aortic  arch branching pattern with common origin of the brachiocephalic and left common carotid arteries. Mild arch atherosclerosis without arch vessel origin stenosis. Right carotid system: Patent with minimal calcified plaque at the carotid bifurcation. No evidence of dissection or stenosis. Tortuous distal cervical ICA. Left carotid system: Patent without evidence of dissection or stenosis. Tortuous distal cervical ICA. Vertebral arteries: Patent with the left being strongly dominant. No evidence of dissection or stenosis. Skeleton: Advanced cervical  disc degeneration with mild multilevel listhesis. Other neck: No evidence of cervical lymphadenopathy or mass. Upper chest: Mild centrilobular emphysema. Review of the MIP images confirms the above findings CTA HEAD FINDINGS Anterior circulation: The internal carotid arteries are patent from skull base to carotid termini with minimal nonstenotic atherosclerosis. ACAs and MCAs are patent with mild branch vessel irregularity but no evidence of proximal branch occlusion or significant proximal stenosis. No aneurysm is identified. Posterior circulation: The intracranial vertebral arteries are widely patent to the basilar. Patent PICA, AICA, and SCA origins are seen bilaterally. The basilar artery is widely patent. There are left larger than right posterior communicating arteries. The PCAs are patent with mild irregularity but no significant proximal stenosis. No aneurysm is identified. Venous sinuses: As permitted by contrast timing, patent. Anatomic variants: None. Review of the MIP images confirms the above findings IMPRESSION: 1. Unremarkable CT appearance of the brain. No evidence of acute intracranial abnormality. 2. Minimal atherosclerosis in the head and neck without large vessel occlusion or significant stenosis. 3.  Aortic Atherosclerosis (ICD10-I70.0). Electronically Signed   By: Logan Bores M.D.   On: 04/19/2019 12:13   Ct Angio Neck W And/or Wo Contrast  Result  Date: 04/19/2019 CLINICAL DATA:  Dizziness. Body aches. EXAM: CT ANGIOGRAPHY HEAD AND NECK TECHNIQUE: Multidetector CT imaging of the head and neck was performed using the standard protocol during bolus administration of intravenous contrast. Multiplanar CT image reconstructions and MIPs were obtained to evaluate the vascular anatomy. Carotid stenosis measurements (when applicable) are obtained utilizing NASCET criteria, using the distal internal carotid diameter as the denominator. CONTRAST:  75 mL Omnipaque 350 COMPARISON:  None. FINDINGS: CT HEAD FINDINGS Brain: There is no evidence of acute infarct, intracranial hemorrhage, mass, midline shift, or extra-axial fluid collection. The ventricles and sulci are within normal limits for age. Vascular: Calcified atherosclerosis at the skull base. Skull: No fracture or focal osseous lesion. Sinuses: Mild mucosal thickening in the paranasal sinuses. Clear mastoid air cells. Orbits: Bilateral cataract extraction. Review of the MIP images confirms the above findings CTA NECK FINDINGS Aortic arch: Normal variant aortic arch branching pattern with common origin of the brachiocephalic and left common carotid arteries. Mild arch atherosclerosis without arch vessel origin stenosis. Right carotid system: Patent with minimal calcified plaque at the carotid bifurcation. No evidence of dissection or stenosis. Tortuous distal cervical ICA. Left carotid system: Patent without evidence of dissection or stenosis. Tortuous distal cervical ICA. Vertebral arteries: Patent with the left being strongly dominant. No evidence of dissection or stenosis. Skeleton: Advanced cervical disc degeneration with mild multilevel listhesis. Other neck: No evidence of cervical lymphadenopathy or mass. Upper chest: Mild centrilobular emphysema. Review of the MIP images confirms the above findings CTA HEAD FINDINGS Anterior circulation: The internal carotid arteries are patent from skull base to carotid  termini with minimal nonstenotic atherosclerosis. ACAs and MCAs are patent with mild branch vessel irregularity but no evidence of proximal branch occlusion or significant proximal stenosis. No aneurysm is identified. Posterior circulation: The intracranial vertebral arteries are widely patent to the basilar. Patent PICA, AICA, and SCA origins are seen bilaterally. The basilar artery is widely patent. There are left larger than right posterior communicating arteries. The PCAs are patent with mild irregularity but no significant proximal stenosis. No aneurysm is identified. Venous sinuses: As permitted by contrast timing, patent. Anatomic variants: None. Review of the MIP images confirms the above findings IMPRESSION: 1. Unremarkable CT appearance of the brain. No evidence of acute intracranial abnormality. 2.  Minimal atherosclerosis in the head and neck without large vessel occlusion or significant stenosis. 3.  Aortic Atherosclerosis (ICD10-I70.0). Electronically Signed   By: Logan Bores M.D.   On: 04/19/2019 12:13   Ct Abdomen Pelvis W Contrast  Result Date: 04/19/2019 CLINICAL DATA:  Dizziness body aches. EXAM: CT ABDOMEN AND PELVIS WITH CONTRAST TECHNIQUE: Multidetector CT imaging of the abdomen and pelvis was performed using the standard protocol following bolus administration of intravenous contrast. CONTRAST:  124m OMNIPAQUE IOHEXOL 300 MG/ML  SOLN COMPARISON:  07/28/2017 and 06/28/2013 FINDINGS: Lower chest: Subtle ground-glass and interstitial thickening noted at the right lung base not present in 2019. No signs of effusion or basilar consolidation. Hepatobiliary: Caudate cyst. No suspicious focal lesion in the liver. Pancreas: Unremarkable. No pancreatic ductal dilatation or surrounding inflammatory changes. Spleen: Normal in size without focal abnormality. Adrenals/Urinary Tract: Normal appearance of bilateral adrenal glands. Symmetric enhancement of the bilateral kidneys. Small low-density renal  lesions likely cysts. No signs of hydronephrosis. Symmetric renal enhancement. Stomach/Bowel: No signs of bowel obstruction or acute bowel process. Post subtotal colectomy with ileal rectal anastomosis. Signs of mural stratification of the rectum with perirectal lymph nodes and high superior rectal lymph nodes unchanged from previous exam. No signs of bowel obstruction. Vascular/Lymphatic: Vascular structures are patent. There is atherosclerotic change within the abdominal aorta tracking in the iliac vessels. No signs of abdominal or pelvic lymphadenopathy aside from mildly enlarged but stable nodes in the rectal mesentery and low abdomen. Reproductive: Prostate heterogeneity, nonspecific by CT. Urinary bladder under distended but otherwise unremarkable. Other: Moderate fat containing right inguinal hernia. No signs of free air. No signs of ascites. Musculoskeletal: Extensive spinal degenerative changes without acute or destructive bone process. IMPRESSION: 1. Signs of mural stratification of the rectum with perirectal lymph nodes and high superior rectal lymph nodes are unchanged from previous exam. Compatible with proctitis. 2. Ground-glass and interstitial thickening at the right lung base may represent pneumonitis. Consider follow-up to ensure resolution at 3 month interval, signs of mild parenchymal distortion and subpleural reticulation also raising the question of early interstitial lung disease. This is new compared to the study of 07/28/2017. 3. Atherosclerotic change in the abdominal aorta and iliac vessels. Aortic Atherosclerosis (ICD10-I70.0). Electronically Signed   By: GZetta BillsM.D.   On: 04/19/2019 11:55   Dg Chest Port 1 View  Result Date: 04/20/2019 CLINICAL DATA:  Covid positive EXAM: PORTABLE CHEST 1 VIEW COMPARISON:  February 27, 2014 FINDINGS: The heart size and mediastinal contours are within normal limits. There is subtle patchy airspace opacity seen at the right lung base. The left  lung is clear. No pleural effusion. No acute osseous abnormality. IMPRESSION: Patchy airspace opacity at the right lung base. The findings in the lungs are nonspecific, but concerning for atypical infection, which includes viral pneumonia. Electronically Signed   By: BPrudencio PairM.D.   On: 04/20/2019 19:29     TODAY-DAY OF DISCHARGE:  Subjective:   Robert Rhodes has no headache,no chest abdominal pain,no new weakness tingling or numbness, feels much better wants to go home today.   Objective:   Blood pressure 123/86, pulse 78, temperature 98.5 F (36.9 C), temperature source Oral, resp. rate 20, height 5' 9"  (1.753 m), weight 93.2 kg, SpO2 97 %.  Intake/Output Summary (Last 24 hours) at 04/25/2019 0909 Last data filed at 04/25/2019 0600 Gross per 24 hour  Intake 650 ml  Output -  Net 650 ml   Filed Weights   04/20/19 1430  04/21/19 0030  Weight: 93.4 kg 93.2 kg    Exam: Awake Alert, Oriented *3, No new F.N deficits, Normal affect [place].AT,PERRAL Supple Neck,No JVD, No cervical lymphadenopathy appriciated.  Symmetrical Chest wall movement, Good air movement bilaterally, CTAB RRR,No Gallops,Rubs or new Murmurs, No Parasternal Heave +ve B.Sounds, Abd Soft, Non tender, No organomegaly appriciated, No rebound -guarding or rigidity. No Cyanosis, Clubbing or edema, No new Rash or bruise   PERTINENT RADIOLOGIC STUDIES: Ct Angio Head W Or Wo Contrast  Result Date: 04/19/2019 CLINICAL DATA:  Dizziness. Body aches. EXAM: CT ANGIOGRAPHY HEAD AND NECK TECHNIQUE: Multidetector CT imaging of the head and neck was performed using the standard protocol during bolus administration of intravenous contrast. Multiplanar CT image reconstructions and MIPs were obtained to evaluate the vascular anatomy. Carotid stenosis measurements (when applicable) are obtained utilizing NASCET criteria, using the distal internal carotid diameter as the denominator. CONTRAST:  75 mL Omnipaque 350 COMPARISON:   None. FINDINGS: CT HEAD FINDINGS Brain: There is no evidence of acute infarct, intracranial hemorrhage, mass, midline shift, or extra-axial fluid collection. The ventricles and sulci are within normal limits for age. Vascular: Calcified atherosclerosis at the skull base. Skull: No fracture or focal osseous lesion. Sinuses: Mild mucosal thickening in the paranasal sinuses. Clear mastoid air cells. Orbits: Bilateral cataract extraction. Review of the MIP images confirms the above findings CTA NECK FINDINGS Aortic arch: Normal variant aortic arch branching pattern with common origin of the brachiocephalic and left common carotid arteries. Mild arch atherosclerosis without arch vessel origin stenosis. Right carotid system: Patent with minimal calcified plaque at the carotid bifurcation. No evidence of dissection or stenosis. Tortuous distal cervical ICA. Left carotid system: Patent without evidence of dissection or stenosis. Tortuous distal cervical ICA. Vertebral arteries: Patent with the left being strongly dominant. No evidence of dissection or stenosis. Skeleton: Advanced cervical disc degeneration with mild multilevel listhesis. Other neck: No evidence of cervical lymphadenopathy or mass. Upper chest: Mild centrilobular emphysema. Review of the MIP images confirms the above findings CTA HEAD FINDINGS Anterior circulation: The internal carotid arteries are patent from skull base to carotid termini with minimal nonstenotic atherosclerosis. ACAs and MCAs are patent with mild branch vessel irregularity but no evidence of proximal branch occlusion or significant proximal stenosis. No aneurysm is identified. Posterior circulation: The intracranial vertebral arteries are widely patent to the basilar. Patent PICA, AICA, and SCA origins are seen bilaterally. The basilar artery is widely patent. There are left larger than right posterior communicating arteries. The PCAs are patent with mild irregularity but no significant  proximal stenosis. No aneurysm is identified. Venous sinuses: As permitted by contrast timing, patent. Anatomic variants: None. Review of the MIP images confirms the above findings IMPRESSION: 1. Unremarkable CT appearance of the brain. No evidence of acute intracranial abnormality. 2. Minimal atherosclerosis in the head and neck without large vessel occlusion or significant stenosis. 3.  Aortic Atherosclerosis (ICD10-I70.0). Electronically Signed   By: Logan Bores M.D.   On: 04/19/2019 12:13   Ct Angio Neck W And/or Wo Contrast  Result Date: 04/19/2019 CLINICAL DATA:  Dizziness. Body aches. EXAM: CT ANGIOGRAPHY HEAD AND NECK TECHNIQUE: Multidetector CT imaging of the head and neck was performed using the standard protocol during bolus administration of intravenous contrast. Multiplanar CT image reconstructions and MIPs were obtained to evaluate the vascular anatomy. Carotid stenosis measurements (when applicable) are obtained utilizing NASCET criteria, using the distal internal carotid diameter as the denominator. CONTRAST:  75 mL Omnipaque 350 COMPARISON:  None.  FINDINGS: CT HEAD FINDINGS Brain: There is no evidence of acute infarct, intracranial hemorrhage, mass, midline shift, or extra-axial fluid collection. The ventricles and sulci are within normal limits for age. Vascular: Calcified atherosclerosis at the skull base. Skull: No fracture or focal osseous lesion. Sinuses: Mild mucosal thickening in the paranasal sinuses. Clear mastoid air cells. Orbits: Bilateral cataract extraction. Review of the MIP images confirms the above findings CTA NECK FINDINGS Aortic arch: Normal variant aortic arch branching pattern with common origin of the brachiocephalic and left common carotid arteries. Mild arch atherosclerosis without arch vessel origin stenosis. Right carotid system: Patent with minimal calcified plaque at the carotid bifurcation. No evidence of dissection or stenosis. Tortuous distal cervical ICA. Left  carotid system: Patent without evidence of dissection or stenosis. Tortuous distal cervical ICA. Vertebral arteries: Patent with the left being strongly dominant. No evidence of dissection or stenosis. Skeleton: Advanced cervical disc degeneration with mild multilevel listhesis. Other neck: No evidence of cervical lymphadenopathy or mass. Upper chest: Mild centrilobular emphysema. Review of the MIP images confirms the above findings CTA HEAD FINDINGS Anterior circulation: The internal carotid arteries are patent from skull base to carotid termini with minimal nonstenotic atherosclerosis. ACAs and MCAs are patent with mild branch vessel irregularity but no evidence of proximal branch occlusion or significant proximal stenosis. No aneurysm is identified. Posterior circulation: The intracranial vertebral arteries are widely patent to the basilar. Patent PICA, AICA, and SCA origins are seen bilaterally. The basilar artery is widely patent. There are left larger than right posterior communicating arteries. The PCAs are patent with mild irregularity but no significant proximal stenosis. No aneurysm is identified. Venous sinuses: As permitted by contrast timing, patent. Anatomic variants: None. Review of the MIP images confirms the above findings IMPRESSION: 1. Unremarkable CT appearance of the brain. No evidence of acute intracranial abnormality. 2. Minimal atherosclerosis in the head and neck without large vessel occlusion or significant stenosis. 3.  Aortic Atherosclerosis (ICD10-I70.0). Electronically Signed   By: Logan Bores M.D.   On: 04/19/2019 12:13   Ct Abdomen Pelvis W Contrast  Result Date: 04/19/2019 CLINICAL DATA:  Dizziness body aches. EXAM: CT ABDOMEN AND PELVIS WITH CONTRAST TECHNIQUE: Multidetector CT imaging of the abdomen and pelvis was performed using the standard protocol following bolus administration of intravenous contrast. CONTRAST:  170m OMNIPAQUE IOHEXOL 300 MG/ML  SOLN COMPARISON:   07/28/2017 and 06/28/2013 FINDINGS: Lower chest: Subtle ground-glass and interstitial thickening noted at the right lung base not present in 2019. No signs of effusion or basilar consolidation. Hepatobiliary: Caudate cyst. No suspicious focal lesion in the liver. Pancreas: Unremarkable. No pancreatic ductal dilatation or surrounding inflammatory changes. Spleen: Normal in size without focal abnormality. Adrenals/Urinary Tract: Normal appearance of bilateral adrenal glands. Symmetric enhancement of the bilateral kidneys. Small low-density renal lesions likely cysts. No signs of hydronephrosis. Symmetric renal enhancement. Stomach/Bowel: No signs of bowel obstruction or acute bowel process. Post subtotal colectomy with ileal rectal anastomosis. Signs of mural stratification of the rectum with perirectal lymph nodes and high superior rectal lymph nodes unchanged from previous exam. No signs of bowel obstruction. Vascular/Lymphatic: Vascular structures are patent. There is atherosclerotic change within the abdominal aorta tracking in the iliac vessels. No signs of abdominal or pelvic lymphadenopathy aside from mildly enlarged but stable nodes in the rectal mesentery and low abdomen. Reproductive: Prostate heterogeneity, nonspecific by CT. Urinary bladder under distended but otherwise unremarkable. Other: Moderate fat containing right inguinal hernia. No signs of free air. No signs of ascites.  Musculoskeletal: Extensive spinal degenerative changes without acute or destructive bone process. IMPRESSION: 1. Signs of mural stratification of the rectum with perirectal lymph nodes and high superior rectal lymph nodes are unchanged from previous exam. Compatible with proctitis. 2. Ground-glass and interstitial thickening at the right lung base may represent pneumonitis. Consider follow-up to ensure resolution at 3 month interval, signs of mild parenchymal distortion and subpleural reticulation also raising the question of early  interstitial lung disease. This is new compared to the study of 07/28/2017. 3. Atherosclerotic change in the abdominal aorta and iliac vessels. Aortic Atherosclerosis (ICD10-I70.0). Electronically Signed   By: Zetta Bills M.D.   On: 04/19/2019 11:55   Dg Chest Port 1 View  Result Date: 04/20/2019 CLINICAL DATA:  Covid positive EXAM: PORTABLE CHEST 1 VIEW COMPARISON:  February 27, 2014 FINDINGS: The heart size and mediastinal contours are within normal limits. There is subtle patchy airspace opacity seen at the right lung base. The left lung is clear. No pleural effusion. No acute osseous abnormality. IMPRESSION: Patchy airspace opacity at the right lung base. The findings in the lungs are nonspecific, but concerning for atypical infection, which includes viral pneumonia. Electronically Signed   By: Prudencio Pair M.D.   On: 04/20/2019 19:29     PERTINENT LAB RESULTS: CBC: Recent Labs    04/24/19 0145 04/25/19 0020  WBC 5.5 7.1  HGB 13.4 13.3  HCT 42.4 41.6  PLT 325 414*   CMET CMP     Component Value Date/Time   NA 134 (L) 04/25/2019 0020   NA 136 07/24/2018 1325   K 3.4 (L) 04/25/2019 0020   CL 97 (L) 04/25/2019 0020   CO2 27 04/25/2019 0020   GLUCOSE 104 (H) 04/25/2019 0020   BUN 14 04/25/2019 0020   BUN 7 (L) 07/24/2018 1325   CREATININE 1.03 04/25/2019 0020   CREATININE 0.82 11/27/2014 1558   CALCIUM 8.9 04/25/2019 0020   PROT 7.2 04/25/2019 0020   PROT 7.0 07/24/2018 1325   ALBUMIN 3.3 (L) 04/25/2019 0020   ALBUMIN 3.7 07/24/2018 1325   AST 67 (H) 04/25/2019 0020   ALT 67 (H) 04/25/2019 0020   ALKPHOS 75 04/25/2019 0020   BILITOT 0.6 04/25/2019 0020   BILITOT 0.3 07/24/2018 1325   GFRNONAA >60 04/25/2019 0020   GFRAA >60 04/25/2019 0020    GFR Estimated Creatinine Clearance: 69.9 mL/min (by C-G formula based on SCr of 1.03 mg/dL). No results for input(s): LIPASE, AMYLASE in the last 72 hours. No results for input(s): CKTOTAL, CKMB, CKMBINDEX, TROPONINI in the  last 72 hours. Invalid input(s): POCBNP No results for input(s): DDIMER in the last 72 hours. No results for input(s): HGBA1C in the last 72 hours. No results for input(s): CHOL, HDL, LDLCALC, TRIG, CHOLHDL, LDLDIRECT in the last 72 hours. No results for input(s): TSH, T4TOTAL, T3FREE, THYROIDAB in the last 72 hours.  Invalid input(s): FREET3 No results for input(s): VITAMINB12, FOLATE, FERRITIN, TIBC, IRON, RETICCTPCT in the last 72 hours. Coags: No results for input(s): INR in the last 72 hours.  Invalid input(s): PT Microbiology: Recent Results (from the past 240 hour(s))  SARS CORONAVIRUS 2 (TAT 6-24 HRS) Nasopharyngeal Nasopharyngeal Swab     Status: Abnormal   Collection Time: 04/19/19  3:50 PM   Specimen: Nasopharyngeal Swab  Result Value Ref Range Status   SARS Coronavirus 2 POSITIVE (A) NEGATIVE Final    Comment: EMAILED TO [facility] 04/19/2019 [place] [facility] (NOTE) SARS-CoV-2 target nucleic acids are DETECTED. The SARS-CoV-2 RNA  is generally detectable in upper and lower respiratory specimens during the acute phase of infection. Positive results are indicative of active infection with SARS-CoV-2. Clinical  correlation with patient history and other diagnostic information is necessary to determine patient infection status. Positive results do  not rule out bacterial infection or co-infection with other viruses. The expected result is Negative. Fact Sheet for Patients: SugarRoll.be Fact Sheet for Healthcare Providers: https://www.woods-mathews.com/ This test is not yet approved or cleared by the [place] FDA and  has been authorized for detection and/or diagnosis of SARS-CoV-2 by FDA under an Emergency Use Authorization (EUA). This EUA will remain  in effect (meaning this test can be used) for the duration of the COVID-19 declara tion under Section 564(b)(1) of the Act, 21 U.S.C. section 360bbb-3(b)(1), unless the  authorization is terminated or revoked sooner. Performed at [facility], [place] [address], [place], [place] [address]     FURTHER DISCHARGE INSTRUCTIONS:  Get Medicines reviewed and adjusted: Please take all your medications with you for your next visit with your Primary MD  Laboratory/radiological data: Please request your Primary MD to go over all hospital tests and procedure/radiological results at the follow up, please ask your Primary MD to get all Hospital records sent to his/her office.  In some cases, they will be blood work, cultures and biopsy results pending at the time of your discharge. Please request that your primary care M.D. goes through all the records of your hospital data and follows up on these results.  Also Note the following: If you experience worsening of your admission symptoms, develop shortness of breath, life threatening emergency, suicidal or homicidal thoughts you must seek medical attention immediately by calling 911 or calling your MD immediately  if symptoms less severe.  You must read complete instructions/literature along with all the possible adverse reactions/side effects for all the Medicines you take and that have been prescribed to you. Take any new Medicines after you have completely understood and accpet all the possible adverse reactions/side effects.   Do not drive when taking Pain medications or sleeping medications (Benzodaizepines)  Do not take more than prescribed Pain, Sleep and Anxiety Medications. It is not advisable to combine anxiety,sleep and pain medications without talking with your primary care practitioner  Special Instructions: If you have smoked or chewed Tobacco  in the last 2 yrs please stop smoking, stop any regular Alcohol  and or any Recreational drug use.  Wear Seat belts while driving.  Please note: You were cared for by a hospitalist during your hospital stay. Once you are discharged, your primary care physician  will handle any further medical issues. Please note that NO REFILLS for any discharge medications will be authorized once you are discharged, as it is imperative that you return to your primary care physician (or establish a relationship with a primary care physician if you do not have one) for your post hospital discharge needs so that they can reassess your need for medications and monitor your lab values.  Total Time spent coordinating discharge including counseling, education and face to face time equals 35 minutes.  SignedOren Binet 04/25/2019 9:09 AM

## 2019-04-26 DIAGNOSIS — E669 Obesity, unspecified: Secondary | ICD-10-CM | POA: Diagnosis not present

## 2019-04-26 DIAGNOSIS — F3162 Bipolar disorder, current episode mixed, moderate: Secondary | ICD-10-CM | POA: Diagnosis not present

## 2019-04-26 DIAGNOSIS — U071 COVID-19: Secondary | ICD-10-CM | POA: Diagnosis not present

## 2019-04-26 DIAGNOSIS — I10 Essential (primary) hypertension: Secondary | ICD-10-CM | POA: Diagnosis not present

## 2019-04-26 DIAGNOSIS — J1289 Other viral pneumonia: Secondary | ICD-10-CM | POA: Diagnosis not present

## 2019-04-26 DIAGNOSIS — I700 Atherosclerosis of aorta: Secondary | ICD-10-CM | POA: Diagnosis not present

## 2019-04-26 DIAGNOSIS — J432 Centrilobular emphysema: Secondary | ICD-10-CM | POA: Diagnosis not present

## 2019-04-26 DIAGNOSIS — K835 Biliary cyst: Secondary | ICD-10-CM | POA: Diagnosis not present

## 2019-04-26 DIAGNOSIS — K529 Noninfective gastroenteritis and colitis, unspecified: Secondary | ICD-10-CM | POA: Diagnosis not present

## 2019-04-30 ENCOUNTER — Telehealth: Payer: Self-pay | Admitting: Family Medicine

## 2019-04-30 NOTE — Telephone Encounter (Signed)
Deena from [facility] called to request skilled Nursing orders  -1 Week 4 Please follow up when possible  [phone]

## 2019-05-01 NOTE — Telephone Encounter (Signed)
Robert Rhodes was called and given verbal orders for patient as requested.

## 2019-05-04 DIAGNOSIS — I10 Essential (primary) hypertension: Secondary | ICD-10-CM | POA: Diagnosis not present

## 2019-05-04 DIAGNOSIS — J1289 Other viral pneumonia: Secondary | ICD-10-CM | POA: Diagnosis not present

## 2019-05-04 DIAGNOSIS — J432 Centrilobular emphysema: Secondary | ICD-10-CM | POA: Diagnosis not present

## 2019-05-04 DIAGNOSIS — E669 Obesity, unspecified: Secondary | ICD-10-CM | POA: Diagnosis not present

## 2019-05-04 DIAGNOSIS — I700 Atherosclerosis of aorta: Secondary | ICD-10-CM | POA: Diagnosis not present

## 2019-05-04 DIAGNOSIS — K835 Biliary cyst: Secondary | ICD-10-CM | POA: Diagnosis not present

## 2019-05-04 DIAGNOSIS — K529 Noninfective gastroenteritis and colitis, unspecified: Secondary | ICD-10-CM | POA: Diagnosis not present

## 2019-05-04 DIAGNOSIS — U071 COVID-19: Secondary | ICD-10-CM | POA: Diagnosis not present

## 2019-05-04 DIAGNOSIS — F3162 Bipolar disorder, current episode mixed, moderate: Secondary | ICD-10-CM | POA: Diagnosis not present

## 2019-05-08 ENCOUNTER — Other Ambulatory Visit: Payer: Self-pay

## 2019-05-08 ENCOUNTER — Ambulatory Visit: Payer: Medicare HMO | Attending: Family Medicine | Admitting: Family Medicine

## 2019-05-08 ENCOUNTER — Ambulatory Visit: Payer: Medicare HMO | Admitting: Family Medicine

## 2019-05-08 DIAGNOSIS — K5190 Ulcerative colitis, unspecified, without complications: Secondary | ICD-10-CM

## 2019-05-08 DIAGNOSIS — I10 Essential (primary) hypertension: Secondary | ICD-10-CM | POA: Diagnosis not present

## 2019-05-08 MED ORDER — AMLODIPINE BESYLATE 5 MG PO TABS: 5.0000 mg | ORAL_TABLET | Freq: Every day | ORAL | 1 refills | Status: DC

## 2019-05-08 NOTE — Progress Notes (Signed)
Virtual Visit via Telephone Note  I connected with Robert Rhodes, on 05/08/2019 at 9:09 AM by telephone due to the COVID-19 pandemic and verified that I am speaking with the correct person using two identifiers.   Consent: I discussed the limitations, risks, security and privacy concerns of performing an evaluation and management service by telephone and the availability of in person appointments. I also discussed with the patient that there may be a patient responsible charge related to this service. The patient expressed understanding and agreed to proceed.   Location of Patient: Home  Location of Provider: Clinic   Persons participating in Telemedicine visit: Vikrant Pryce Farrington-CMA Dr. Margarita Rana     History of Present Illness: Robert Rhodes  is a 73 year old male with a history of hypertension , ulcerative colitis (on mesalamine, currently followed by GI-Dr. Zenovia Jarred), bilateral knee osteoarthritis who is here today for follow-up visit after hospitalization at [facility] from 04/20/19-04/25/19 for COVID-19 pneumonitis and gastroenteritis. Maintained on room air treated with remdesivir with improvement in symptoms.  He feels fine today. Denies presence of diarrhea, dyspnea, chest pain. He volunteered at a recovery class and states he contracted COVID-19 from there but states he had his face shield and mask while volunteering. He got off isolation on 05/01/19  His appointment with GI comes up next month for follow-up of his ulcerative colitis.  He is compliant with his antihypertensive and denies adverse effects from his medication.  He has no additional concerns today.  Past Medical History:  Diagnosis Date  . Adenomatous colon polyp   . Arthritis   . Claustrophobia   . Colon cancer ([place]) 2015  . COVID-19   . Eye abnormalities    right eye seen in ED 02/17/2014 wearing eye patch using eye drops  . GERD (gastroesophageal reflux disease)   .  Hypertension   . Irregular heartbeat    per pt/doesn't know what it does  . Osteoarthritis    bilat knees  needs replacement  . Osteoporosis    pt denies  . Substance abuse ([place])    stopped over 3 years ago  . UC (ulcerative colitis) ([place])   . Vitamin D deficiency    Allergies  Allergen Reactions  . Ace Inhibitors Other (See Comments)    Unknown reaction  . Advil [Ibuprofen] Other (See Comments)    Muscle tightness  . Aspirin Nausea And Vomiting  . Tylenol [Acetaminophen] Other (See Comments)    Muscle tightness    Current Outpatient Medications on File Prior to Visit  Medication Sig Dispense Refill  . amLODipine (NORVASC) 5 MG tablet Take 1 tablet (5 mg total) by mouth daily. 30 tablet 0  . mesalamine (CANASA) 1000 MG suppository Place 1 suppository (1,000 mg total) rectally at bedtime. 90 suppository 1  . Misc. Devices MISC Blood pressure machine.  Diagnosis-hypertension 1 each 0  . Multiple Vitamin (MULTIVITAMIN WITH MINERALS) TABS tablet Take 1 tablet by mouth daily at 12 noon. (Patient taking differently: Take 1 tablet by mouth at bedtime. )    . Naproxen Sodium (ALEVE) 220 MG CAPS Take 440 mg by mouth 2 (two) times daily as needed (arthritis pain).    . Omega 3 1200 MG CAPS Take 1,200 mg by mouth daily.      Current Facility-Administered Medications on File Prior to Visit  Medication Dose Route Frequency Provider Last Rate Last Dose  . 0.9 %  sodium chloride infusion  500 mL Intravenous Continuous Pyrtle, Ulice Dash  M, MD      . 0.9 %  sodium chloride infusion  500 mL Intravenous Once Pyrtle, Lajuan Lines, MD        Observations/Objective: Alert, awake, oriented x3 Not in acute distress  Assessment and Plan: 1. Essential hypertension, benign Stable - amLODipine (NORVASC) 5 MG tablet; Take 1 tablet (5 mg total) by mouth daily. Dx: Hypertension  Dispense: 90 tablet; Refill: 1  2. Ulcerative colitis without complications, unspecified location ([place]) Controlled on  mesalamine Follow-up with GI   Follow Up Instructions: Return in about 6 months (around 11/05/2019) for Medical conditions.    I discussed the assessment and treatment plan with the patient. The patient was provided an opportunity to ask questions and all were answered. The patient agreed with the plan and demonstrated an understanding of the instructions.   The patient was advised to call back or seek an in-person evaluation if the symptoms worsen or if the condition fails to improve as anticipated.     I provided 15 minutes total of non-face-to-face time during this encounter including median intraservice time, reviewing previous notes, labs, imaging, medications, management and patient verbalized understanding.     [name], MD, FAAFP. [facility] and Abie Faxon, [place]   05/08/2019, 9:09 AM

## 2019-05-08 NOTE — Progress Notes (Signed)
Patient has been called and DOB has been verified. Patient has been screened and transferred to PCP to start phone visit.     

## 2019-05-14 DIAGNOSIS — J432 Centrilobular emphysema: Secondary | ICD-10-CM | POA: Diagnosis not present

## 2019-05-14 DIAGNOSIS — U071 COVID-19: Secondary | ICD-10-CM | POA: Diagnosis not present

## 2019-05-14 DIAGNOSIS — F3162 Bipolar disorder, current episode mixed, moderate: Secondary | ICD-10-CM | POA: Diagnosis not present

## 2019-05-14 DIAGNOSIS — K529 Noninfective gastroenteritis and colitis, unspecified: Secondary | ICD-10-CM | POA: Diagnosis not present

## 2019-05-14 DIAGNOSIS — I700 Atherosclerosis of aorta: Secondary | ICD-10-CM | POA: Diagnosis not present

## 2019-05-14 DIAGNOSIS — K835 Biliary cyst: Secondary | ICD-10-CM | POA: Diagnosis not present

## 2019-05-14 DIAGNOSIS — I10 Essential (primary) hypertension: Secondary | ICD-10-CM | POA: Diagnosis not present

## 2019-05-14 DIAGNOSIS — J1289 Other viral pneumonia: Secondary | ICD-10-CM | POA: Diagnosis not present

## 2019-05-14 DIAGNOSIS — E669 Obesity, unspecified: Secondary | ICD-10-CM | POA: Diagnosis not present

## 2019-05-21 ENCOUNTER — Telehealth: Payer: Self-pay | Admitting: Internal Medicine

## 2019-05-21 DIAGNOSIS — K51919 Ulcerative colitis, unspecified with unspecified complications: Secondary | ICD-10-CM

## 2019-05-21 MED ORDER — MESALAMINE 1000 MG RE SUPP: 1000.0000 mg | Freq: Every day | RECTAL | 0 refills | Status: DC

## 2019-05-21 NOTE — Telephone Encounter (Signed)
Pt is scheduled for OV 05/28/19 and asked whether refill for mesalamine is appropriate.

## 2019-05-21 NOTE — Telephone Encounter (Signed)
Rx sent until office visit

## 2019-05-24 DIAGNOSIS — I10 Essential (primary) hypertension: Secondary | ICD-10-CM | POA: Diagnosis not present

## 2019-05-24 DIAGNOSIS — I700 Atherosclerosis of aorta: Secondary | ICD-10-CM | POA: Diagnosis not present

## 2019-05-24 DIAGNOSIS — E669 Obesity, unspecified: Secondary | ICD-10-CM | POA: Diagnosis not present

## 2019-05-24 DIAGNOSIS — F3162 Bipolar disorder, current episode mixed, moderate: Secondary | ICD-10-CM | POA: Diagnosis not present

## 2019-05-24 DIAGNOSIS — K529 Noninfective gastroenteritis and colitis, unspecified: Secondary | ICD-10-CM | POA: Diagnosis not present

## 2019-05-24 DIAGNOSIS — J432 Centrilobular emphysema: Secondary | ICD-10-CM | POA: Diagnosis not present

## 2019-05-24 DIAGNOSIS — U071 COVID-19: Secondary | ICD-10-CM | POA: Diagnosis not present

## 2019-05-24 DIAGNOSIS — J1289 Other viral pneumonia: Secondary | ICD-10-CM | POA: Diagnosis not present

## 2019-05-24 DIAGNOSIS — K835 Biliary cyst: Secondary | ICD-10-CM | POA: Diagnosis not present

## 2019-05-28 ENCOUNTER — Ambulatory Visit (INDEPENDENT_AMBULATORY_CARE_PROVIDER_SITE_OTHER): Payer: Medicare HMO | Admitting: Nurse Practitioner

## 2019-05-28 ENCOUNTER — Encounter: Payer: Self-pay | Admitting: Nurse Practitioner

## 2019-05-28 ENCOUNTER — Other Ambulatory Visit (INDEPENDENT_AMBULATORY_CARE_PROVIDER_SITE_OTHER): Payer: Medicare HMO

## 2019-05-28 VITALS — BP 120/76 | HR 76 | Temp 97.8°F | Ht 69.0 in | Wt 211.0 lb

## 2019-05-28 DIAGNOSIS — K51919 Ulcerative colitis, unspecified with unspecified complications: Secondary | ICD-10-CM | POA: Diagnosis not present

## 2019-05-28 DIAGNOSIS — R748 Abnormal levels of other serum enzymes: Secondary | ICD-10-CM

## 2019-05-28 DIAGNOSIS — Z85038 Personal history of other malignant neoplasm of large intestine: Secondary | ICD-10-CM

## 2019-05-28 LAB — CBC WITH DIFFERENTIAL/PLATELET
Basophils Absolute: 0 10*3/uL (ref 0.0–0.1)
Basophils Relative: 0.3 % (ref 0.0–3.0)
Eosinophils Absolute: 0.1 10*3/uL (ref 0.0–0.7)
Eosinophils Relative: 1.8 % (ref 0.0–5.0)
HCT: 39.7 % (ref 39.0–52.0)
Hemoglobin: 13.2 g/dL (ref 13.0–17.0)
Lymphocytes Relative: 31.9 % (ref 12.0–46.0)
Lymphs Abs: 2.4 10*3/uL (ref 0.7–4.0)
MCHC: 33.2 g/dL (ref 30.0–36.0)
MCV: 84.2 fl (ref 78.0–100.0)
Monocytes Absolute: 0.9 10*3/uL (ref 0.1–1.0)
Monocytes Relative: 11.5 % (ref 3.0–12.0)
Neutro Abs: 4.1 10*3/uL (ref 1.4–7.7)
Neutrophils Relative %: 54.5 % (ref 43.0–77.0)
Platelets: 435 10*3/uL — ABNORMAL HIGH (ref 150.0–400.0)
RBC: 4.71 Mil/uL (ref 4.22–5.81)
RDW: 14.5 % (ref 11.5–15.5)
WBC: 7.5 10*3/uL (ref 4.0–10.5)

## 2019-05-28 LAB — COMPREHENSIVE METABOLIC PANEL
ALT: 23 U/L (ref 0–53)
AST: 21 U/L (ref 0–37)
Albumin: 3.8 g/dL (ref 3.5–5.2)
Alkaline Phosphatase: 76 U/L (ref 39–117)
BUN: 9 mg/dL (ref 6–23)
CO2: 28 mEq/L (ref 19–32)
Calcium: 9.4 mg/dL (ref 8.4–10.5)
Chloride: 101 mEq/L (ref 96–112)
Creatinine, Ser: 0.96 mg/dL (ref 0.40–1.50)
GFR: 92.25 mL/min (ref >60.00)
Glucose, Bld: 70 mg/dL (ref 70–99)
Potassium: 3.9 mEq/L (ref 3.5–5.1)
Sodium: 134 mEq/L — ABNORMAL LOW (ref 135–145)
Total Bilirubin: 0.4 mg/dL (ref 0.2–1.2)
Total Protein: 7.8 g/dL (ref 6.0–8.3)

## 2019-05-28 LAB — C-REACTIVE PROTEIN: CRP: <1 mg/dL (ref 0.5–20.0)

## 2019-05-28 LAB — GAMMA GT: GGT: 36 U/L (ref 7–51)

## 2019-05-28 NOTE — Progress Notes (Signed)
05/28/2019 Robert Rhodes [id number] [birth year]   History of Present Illness:  Robert Rhodes is a 73 year old male with past medical history of osteoarthritis, polysubstance abuse, ulcerative colitis, colon cancer status post subtotal colectomy with ileorectal anastomosis in 3/ 2015, GERD with history of esophagitis, hiatal hernia. S/P ventral hernia repair 2005 and 2015. He presented to [facility] ER on 04/19/2019 dizziness and nonbloody diarrhea. An MRI of the brain was recommended by neurology but the patient declined to proceed with the MRI due to having claustrophobia. A  CTA of the head and neck was negative. CTAP showed evidence of proctitis which was unchanged from a prior CT. Ground glass opacities in the right lung base was found. He was discharged home on Cipro and Flagyl po. He returned to the ED 04/20/2019 with worsening diarrhea and dizziness. A COVID 19 test was positive and he was transferred to [place] showed mild AKI, hyponatremia and metabolic acidosis most likely from diarrhea. His LFTs were elevated (AST 71. ALT 51) most likely due to Covid 19. He was not in respiratory distress. He received Remdesivir IV daily x 5, Zosyn IV and Canasa suppository 1gm QD was continued. HCTZ was discontinued. His diarrhea resolved and he was discharged home 04/25/2019. Repeat labs and chest xray have not been done.  He presents today for ulcerative colitis follow up. No further diarrhea. He is using Canasa suppository 1gm PR QHS. He is passing a normal formed brown stool once 2 to 3 times daily. No upper or lower abdominal pain. His appetite is good. He is a vegetarian. He eats a low sodiuim diet. He denies alcohol use. He takes Aleve 210m 2 tabs four to 5 does monthly for knee pain. His most recent flexible sigmoidoscopy was 08/03/2018 which showed a patent end-to-side ileo-rectal anastomosis, characterized by healthy appearing mucosa, moderately active ulcerative colitis, worsened  since the last examination. Rectal biopsies showed moderate to severely active chronic colitis without evidence of dysplasia or malignancy. Repeat colonoscopy in 2 years advised.    Abdominal/pelvic CT 04/19/2019: 1. Signs of mural stratification of the rectum with perirectal lymph nodes and high superior rectal lymph nodes are unchanged from previous exam. Compatible with proctitis. 2. Ground-glass and interstitial thickening at the right lung base may represent pneumonitis. Consider follow-up to ensure resolution at 3 month interval, signs of mild parenchymal distortion and subpleural reticulation also raising the question of early interstitial lung disease. This is new compared to the study of 07/28/2017. 3. Atherosclerotic change in the abdominal aorta and iliac   Past Medical History:  Diagnosis Date  . Adenomatous colon polyp   . Arthritis   . Claustrophobia   . Colon cancer [place]  . COVID-19   . Eye abnormalities    right eye seen in ED 02/17/2014 wearing eye patch using eye drops  . GERD (gastroesophageal reflux disease)   . Hypertension   . Irregular heartbeat    per pt/doesn't know what it does  . Osteoarthritis    bilat knees  needs replacement  . Osteoporosis    pt denies  . [place]    stopped over 3 years ago  . [place]cerative colitis) [place]   . Vitamin D deficiency    Past Surgical History:  Procedure Laterality Date  . COLON RESECTION N/A 08/23/2013   Procedure: Laparoscopic total abdominal colectomy and hernia repair;  Surgeon: ALeighton Ruff MD;  Location: [facility];  Service: General;  Laterality: N/A;  .  COLON SURGERY  08/2013  . COLONOSCOPY    . EUS N/A 07/18/2013   Procedure: UPPER ENDOSCOPIC ULTRASOUND (EUS) RADIAL;  Surgeon: Robert Banister, MD;  Location: [facility];  Service: Endoscopy;  Laterality: N/A;  . EYE SURGERY    . HERNIA REPAIR  2005   In [place] in a mesh  . INCISIONAL HERNIA REPAIR N/A 02/20/2014   Procedure: LAP  ASSISTED INCISIONAL HERNIA REPAIR LYSIS OF ADHESIONS;  Surgeon: Robert Ruff, MD;  Location: [facility];  Service: General;  Laterality: N/A;  converted to open @ 0935  . INCISIONAL HERNIA REPAIR N/A 02/20/2014   Procedure: HERNIA REPAIR INCISIONAL;  Surgeon: Robert Ruff, MD;  Location: [facility];  Service: General;  Laterality: N/A;  With MESH  . INTRAOCULAR LENS INSERTION Bilateral    6 yrs ago  . VENTRAL HERNIA REPAIR     Current Outpatient Medications on File Prior to Visit  Medication Sig Dispense Refill  . amLODipine (NORVASC) 5 MG tablet Take 1 tablet (5 mg total) by mouth daily. Dx: Hypertension 90 tablet 1  . mesalamine (CANASA) 1000 MG suppository Place 1 suppository (1,000 mg total) rectally at bedtime. 30 suppository 0  . Misc. Devices MISC Blood pressure machine.  Diagnosis-hypertension 1 each 0  . Multiple Vitamin (MULTIVITAMIN WITH MINERALS) TABS tablet Take 1 tablet by mouth daily at 12 noon. (Patient taking differently: Take 1 tablet by mouth at bedtime. )    . Naproxen Sodium (ALEVE) 220 MG CAPS Take 440 mg by mouth 2 (two) times daily as needed (arthritis pain).    . Omega 3 1200 MG CAPS Take 1,200 mg by mouth daily.      Current Facility-Administered Medications on File Prior to Visit  Medication Dose Route Frequency Provider Last Rate Last Admin  . 0.9 %  sodium chloride infusion  500 mL Intravenous Continuous Pyrtle, Lajuan Lines, MD      . 0.9 %  sodium chloride infusion  500 mL Intravenous Once Pyrtle, Lajuan Lines, MD       Allergies  Allergen Reactions  . Ace Inhibitors Other (See Comments)    Unknown reaction  . Advil [Ibuprofen] Other (See Comments)    Muscle tightness  . Aspirin Nausea And Vomiting  . Tylenol [Acetaminophen] Other (See Comments)    Muscle tightness    Current Medications, Allergies, Past Medical History, Past Surgical History, Family History and Social History were reviewed in [employer] record.  Physical Exam: BP 120/76   Pulse  76   Temp 97.8 F (36.6 C)   Ht 5' 9"  (1.753 m)   Wt 211 lb (95.7 kg)   BMI 31.16 kg/m  General: 73 year old male alert in no acute distress Head: Normocephalic and atraumatic Eyes:  Sclerae anicteric, conjunctiva pink  Ears: Normal auditory acuity Lungs: Clear throughout to auscultation Heart: Regular rate and rhythm Abdomen: Soft, non tender and non distended. Large ventral hernia with mesh protrudes, scar intact. No hepatomegaly. Normal bowel sounds x 4 quads.  Rectal: Deferred Musculoskeletal: Symmetrical with no gross deformities  Extremities: No edema  Neurological: Alert oriented x 4, grossly nonfocal Psychological:  Alert and cooperative. Normal mood and affect  Assessment and Recommendations:  38. 73 year old male with a history colon cancer status post subtotal colectomy with ileorectal anastomosis in 3/ 2015.  -Next surveillance colonoscopy due 07/2020  2. Ulcerative Colitis. Diarrhea resolved. No abdominal pain.  -continue Canasa 1gm suppository PR Q HS -patient to call office if his diarrhea  recurs -CBC, CRP -Next surveillance colonoscopy due 07/2020 -Advised no Aleve/NSAIDs  3. Elevated LFTs most likely due to Covid 19 -check CMP, GGT  4. Covid 19 04/2019 treated with Remdesivir x 5 doses -PCP to repeat chest xray if not already done

## 2019-05-28 NOTE — Patient Instructions (Signed)
If you are age 75 or older, your body mass index should be between 23-30. Your Body mass index is 31.16 kg/m. If this is out of the aforementioned range listed, please consider follow up with your Primary Care Provider.  If you are age 31 or younger, your body mass index should be between 19-25. Your Body mass index is 31.16 kg/m. If this is out of the aformentioned range listed, please consider follow up with your Primary Care Provider.   Your provider has requested that you go to the basement level for lab work before leaving today. Press "B" on the elevator. The lab is located at the first door on the left as you exit the elevator.   Call if diarrhea reoccurs.   Follow up with Dr. Hilarie Fredrickson in 3 months.

## 2019-05-29 NOTE — Progress Notes (Signed)
Addendum: Reviewed and agree with assessment and management plan. Regis Hinton M, MD  

## 2019-05-30 DIAGNOSIS — J432 Centrilobular emphysema: Secondary | ICD-10-CM | POA: Diagnosis not present

## 2019-05-30 DIAGNOSIS — I700 Atherosclerosis of aorta: Secondary | ICD-10-CM | POA: Diagnosis not present

## 2019-05-30 DIAGNOSIS — J1289 Other viral pneumonia: Secondary | ICD-10-CM | POA: Diagnosis not present

## 2019-05-30 DIAGNOSIS — U071 COVID-19: Secondary | ICD-10-CM | POA: Diagnosis not present

## 2019-05-30 DIAGNOSIS — K835 Biliary cyst: Secondary | ICD-10-CM | POA: Diagnosis not present

## 2019-05-30 DIAGNOSIS — F3162 Bipolar disorder, current episode mixed, moderate: Secondary | ICD-10-CM | POA: Diagnosis not present

## 2019-05-30 DIAGNOSIS — K529 Noninfective gastroenteritis and colitis, unspecified: Secondary | ICD-10-CM | POA: Diagnosis not present

## 2019-05-30 DIAGNOSIS — E669 Obesity, unspecified: Secondary | ICD-10-CM | POA: Diagnosis not present

## 2019-05-30 DIAGNOSIS — I10 Essential (primary) hypertension: Secondary | ICD-10-CM | POA: Diagnosis not present

## 2019-05-31 ENCOUNTER — Telehealth: Payer: Self-pay

## 2019-05-31 NOTE — Telephone Encounter (Signed)
Patient was called and a voicemail was left informing patient to return phone call. 

## 2019-05-31 NOTE — Telephone Encounter (Signed)
Nothing needs to be done as sodium is just one point below normal and on repeat labs could be corrected.

## 2019-05-31 NOTE — Telephone Encounter (Signed)
Patient was seen by his Gastro doctor on 05/28/2019 and they informed him that his sodium is low. Patient was given an OV to discuss this matter.  Patient is requesting advise on what to do about his levels until his appointment.

## 2019-06-03 ENCOUNTER — Ambulatory Visit: Payer: Medicare HMO | Attending: Family Medicine

## 2019-06-03 ENCOUNTER — Other Ambulatory Visit: Payer: Self-pay

## 2019-06-03 DIAGNOSIS — E871 Hypo-osmolality and hyponatremia: Secondary | ICD-10-CM | POA: Diagnosis not present

## 2019-06-04 LAB — BASIC METABOLIC PANEL
BUN/Creatinine Ratio: 7 — ABNORMAL LOW (ref 10–24)
BUN: 7 mg/dL — ABNORMAL LOW (ref 8–27)
CO2: 22 mmol/L (ref 20–29)
Calcium: 9.5 mg/dL (ref 8.6–10.2)
Chloride: 98 mmol/L (ref 96–106)
Creatinine, Ser: 1.01 mg/dL (ref 0.76–1.27)
GFR calc Af Amer: 84 mL/min/{1.73_m2} (ref >59)
GFR calc non Af Amer: 72 mL/min/{1.73_m2} (ref >59)
Glucose: 113 mg/dL — ABNORMAL HIGH (ref 65–99)
Potassium: 3.9 mmol/L (ref 3.5–5.2)
Sodium: 138 mmol/L (ref 134–144)

## 2019-06-05 ENCOUNTER — Telehealth: Payer: Self-pay

## 2019-06-05 NOTE — Telephone Encounter (Signed)
Patient was called and a voicemail was left informing patient to return phone call for lab results. 

## 2019-06-05 NOTE — Telephone Encounter (Signed)
Patient was called and informed of lab results. 

## 2019-06-05 NOTE — Telephone Encounter (Signed)
-----   Message from [name], MD sent at 06/04/2019  8:00 AM EST ----- Can you please inform him his sodium is normal?  Thank you

## 2019-06-12 ENCOUNTER — Ambulatory Visit: Payer: Medicare HMO | Admitting: Family Medicine

## 2019-06-20 ENCOUNTER — Other Ambulatory Visit: Payer: Self-pay | Admitting: *Deleted

## 2019-06-20 DIAGNOSIS — K51919 Ulcerative colitis, unspecified with unspecified complications: Secondary | ICD-10-CM

## 2019-06-20 MED ORDER — MESALAMINE 1000 MG RE SUPP: 1000.0000 mg | Freq: Every day | RECTAL | 1 refills | Status: DC

## 2019-06-24 ENCOUNTER — Telehealth: Payer: Self-pay | Admitting: *Deleted

## 2019-06-24 NOTE — Telephone Encounter (Signed)
We have received documentation that patient's mesalamine suppository 1,000 mcg has been approved through 06/12/20. Member ID [id number].

## 2019-07-05 ENCOUNTER — Telehealth: Payer: Self-pay | Admitting: Internal Medicine

## 2019-07-05 NOTE — Telephone Encounter (Signed)
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 Medication has since been approved as of 06/24/19. He verbalizes understanding.

## 2019-07-25 ENCOUNTER — Other Ambulatory Visit: Payer: Self-pay | Admitting: Internal Medicine

## 2019-07-25 DIAGNOSIS — K51919 Ulcerative colitis, unspecified with unspecified complications: Secondary | ICD-10-CM

## 2019-08-25 ENCOUNTER — Other Ambulatory Visit: Payer: Self-pay | Admitting: Internal Medicine

## 2019-08-25 DIAGNOSIS — K51919 Ulcerative colitis, unspecified with unspecified complications: Secondary | ICD-10-CM

## 2019-08-26 ENCOUNTER — Other Ambulatory Visit: Payer: Self-pay | Admitting: Internal Medicine

## 2019-08-26 DIAGNOSIS — K51919 Ulcerative colitis, unspecified with unspecified complications: Secondary | ICD-10-CM

## 2019-08-28 ENCOUNTER — Telehealth: Payer: Self-pay | Admitting: Internal Medicine

## 2019-08-28 NOTE — Telephone Encounter (Signed)
Patient needs to schedule 3 month follow up appointment for ulcerative colitis (as requested by Carl Best, PA at his 05/2020)  first please. Thanks

## 2019-08-28 NOTE — Telephone Encounter (Signed)
Patient is requesting refill on Mesalamine

## 2019-08-29 ENCOUNTER — Other Ambulatory Visit: Payer: Self-pay | Admitting: Internal Medicine

## 2019-08-29 DIAGNOSIS — K51919 Ulcerative colitis, unspecified with unspecified complications: Secondary | ICD-10-CM

## 2019-08-29 NOTE — Telephone Encounter (Signed)
Patient has been scheduled for medication refill follow up.

## 2019-09-06 ENCOUNTER — Encounter: Payer: Self-pay | Admitting: Physician Assistant

## 2019-09-06 ENCOUNTER — Ambulatory Visit (INDEPENDENT_AMBULATORY_CARE_PROVIDER_SITE_OTHER): Payer: Medicare HMO | Admitting: Physician Assistant

## 2019-09-06 VITALS — BP 150/70 | HR 88 | Temp 97.8°F | Ht 69.0 in | Wt 218.4 lb

## 2019-09-06 DIAGNOSIS — K5190 Ulcerative colitis, unspecified, without complications: Secondary | ICD-10-CM

## 2019-09-06 MED ORDER — MESALAMINE 1000 MG RE SUPP: RECTAL | 3 refills | Status: DC

## 2019-09-06 NOTE — Patient Instructions (Addendum)
If you are age 76 or older, your body mass index should be between 23-30. Your Body mass index is 32.25 kg/m. If this is out of the aforementioned range listed, please consider follow up with your Primary Care Provider.  If you are age 99 or younger, your body mass index should be between 19-25. Your Body mass index is 32.25 kg/m. If this is out of the aformentioned range listed, please consider follow up with your Primary Care Provider.   Mesalamine refills have been sent to your pharmacy.  Please call the office to schedule a follow up in 1 year.

## 2019-09-06 NOTE — Progress Notes (Signed)
Chief Complaint: Follow-up ulcerative colitis  HPI:    Mr. Kanouse is a 74 year old male, known to Dr. Hilarie Fredrickson, with a past medical history of osteoarthritis, polysubstance abuse, ulcerative colitis, colon cancer status post subtotal colectomy with ileorectal anastomosis in 08/2013, GERD with history of esophagitis, hiatal hernia, status post ventral hernia repair 2005 in 2015, who presents clinic today for follow-up of his ulcerative colitis.    05/28/2019 patient was seen by Carl Best, NP for follow-up after being in the ER 04/19/2019 with dizziness and nonbloody diarrhea.  CTAP showed evidence of proctitis which is unchanged from prior CT.  He was discharged home on Cipro and Flagyl.  COVID-19 test was + 04/20/2019 and he was transferred to [facility].  Had diarrhea during that stay and was given Canasa suppository 1 g daily.  His diarrhea resolved he was discharged home 04/25/2019.  When he was seen last he had been using Canasa suppository 1 g PR nightly and was passing normal formed brown stool 1-2 times daily.  His last fix flex sigmoidoscopy was noted 08/03/2018 which showed a patent end-to-end ileorectal anastomosis characterized by healthy-appearing mucosa, moderately active ulcerative colitis which had worsened since last exam.  Biopsy showed moderate to severely active chronic colitis in the rectum without evidence of dysplasia or malignancy.  Repeat colonoscopy was recommended in 2 years.  At that time was noted next surveillance colonoscopy due 07/2020, continued with Canasa 1 g suppository per rectum nightly, CBC and CRP were checked.  Labs were normal.    Today, the patient presents to clinic and tells me that he is doing perfectly well if he uses his Canasa suppositories once a day.  He is having a formed brown stool and has no complaints.    Denies fever, chills, blood in his stool or weight loss.   Past Medical History:  Diagnosis Date  . Adenomatous colon polyp     . Arthritis   . Claustrophobia   . Colon cancer ([place]) 2015  . COVID-19   . Eye abnormalities    right eye seen in ED 02/17/2014 wearing eye patch using eye drops  . GERD (gastroesophageal reflux disease)   . Hypertension   . Irregular heartbeat    per pt/doesn't know what it does  . Osteoarthritis    bilat knees  needs replacement  . Osteoporosis    pt denies  . Substance abuse ([place])    stopped over 3 years ago  . UC (ulcerative colitis) ([place])   . Vitamin D deficiency     Past Surgical History:  Procedure Laterality Date  . COLON RESECTION N/A 08/23/2013   Procedure: Laparoscopic total abdominal colectomy and hernia repair;  Surgeon: Leighton Ruff, MD;  [facility];  Service: General;  Laterality: N/A;  . COLON SURGERY  08/2013  . COLONOSCOPY    . EUS N/A 07/18/2013   Procedure: UPPER ENDOSCOPIC ULTRASOUND (EUS) RADIAL;  Surgeon: Milus Banister, MD;  [facility];  Service: Endoscopy;  Laterality: N/A;  . EYE SURGERY    . HERNIA REPAIR  2005   In [place]/put in a mesh  . INCISIONAL HERNIA REPAIR N/A 02/20/2014   Procedure: LAP ASSISTED INCISIONAL HERNIA REPAIR LYSIS OF ADHESIONS;  Surgeon: Leighton Ruff, MD;  [facility];  Service: General;  Laterality: N/A;  converted to open @ 0935  . INCISIONAL HERNIA REPAIR N/A 02/20/2014   Procedure: HERNIA REPAIR INCISIONAL;  Surgeon: Leighton Ruff, MD;  Location[facility];  Service: General;  Laterality: N/A;  With MESH  . INTRAOCULAR LENS INSERTION Bilateral    6 yrs ago  . VENTRAL HERNIA REPAIR      Current Outpatient Medications  Medication Sig Dispense Refill  . amLODipine (NORVASC) 5 MG tablet Take 1 tablet (5 mg total) by mouth daily. Dx: Hypertension 90 tablet 1  . mesalamine (CANASA) 1000 MG suppository PLACE 1 SUPPOSITORY RECTALLY AT BEDTIME 30 suppository 0  . Misc. Devices MISC Blood pressure machine.  Diagnosis-hypertension 1 each 0  . Multiple Vitamin (MULTIVITAMIN WITH MINERALS) TABS tablet Take 1 tablet by  mouth daily at 12 noon. (Patient taking differently: Take 1 tablet by mouth at bedtime. )    . Naproxen Sodium (ALEVE) 220 MG CAPS Take 440 mg by mouth 2 (two) times daily as needed (arthritis pain).    . Omega 3 1200 MG CAPS Take 1,200 mg by mouth daily.      Current Facility-Administered Medications  Medication Dose Route Frequency Provider Last Rate Last Admin  . 0.9 %  sodium chloride infusion  500 mL Intravenous Continuous Pyrtle, Lajuan Lines, MD      . 0.9 %  sodium chloride infusion  500 mL Intravenous Once Pyrtle, Lajuan Lines, MD        Allergies as of 09/06/2019 - Review Complete 05/28/2019  Allergen Reaction Noted  . Ace inhibitors Other (See Comments) 08/19/2013  . Advil [ibuprofen] Other (See Comments) 03/05/2013  . Aspirin Nausea And Vomiting 03/05/2013  . Tylenol [acetaminophen] Other (See Comments) 03/05/2013    Family History  Problem Relation Age of Onset  . Cancer Mother        type unknown  . Diabetes Father   . Heart disease Father   . Heart disease Sister   . Heart attack Brother   . Healthy Sister   . Colon cancer Neg Hx   . Esophageal cancer Neg Hx   . Rectal cancer Neg Hx   . Stomach cancer Neg Hx     Social History   Socioeconomic History  . Marital status: Single    Spouse name: Not on file  . Number of children: 0  . Years of education: Not on file  . Highest education level: Not on file  Occupational History  . Occupation: retired  Tobacco Use  . Smoking status: Former Smoker    Packs/day: 0.00    Types: Cigarettes    Quit date: 06/14/2003    Years since quitting: 16.2  . Smokeless tobacco: Never Used  . Tobacco comment: quit in July 2005  Substance and Sexual Activity  . Alcohol use: No  . Drug use: Not Currently    Types: 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  . Sexual activity: Yes  Other Topics Concern  . Not on file  Social History Narrative  . Not on file   Social Determinants of Health   Financial Resource Strain:   .  Difficulty of Paying Living Expenses:   Food Insecurity:   . Worried About Charity fundraiser in the Last Year:   . Arboriculturist in the Last Year:   Transportation Needs:   . Film/video editor (Medical):   Marland Kitchen Lack of Transportation (Non-Medical):   Physical Activity:   . Days of Exercise per Week:   . Minutes of Exercise per Session:   Stress:   . Feeling of Stress :   Social Connections:   . Frequency of Communication with Friends and Family:   .  Frequency of Social Gatherings with Friends and Family:   . Attends Religious Services:   . Active Member of Clubs or Organizations:   . Attends Archivist Meetings:   Marland Kitchen Marital Status:   Intimate Partner Violence:   . Fear of Current or Ex-Partner:   . Emotionally Abused:   Marland Kitchen Physically Abused:   . Sexually Abused:     Review of Systems:    Constitutional: No weight loss, fever or chills Cardiovascular: No chest pai Respiratory: No SOB  Gastrointestinal: See HPI and otherwise negative   Physical Exam:  Vital signs: BP (!) 150/70   Pulse 88   Temp 97.8 F (36.6 C)   Ht 5' 9"  (1.753 m)   Wt 218 lb 6.4 oz (99.1 kg)   BMI 32.25 kg/m   Constitutional:   Pleasant AA male appears to be in NAD, Well developed, Well nourished, alert and cooperative Respiratory: Respirations even and unlabored. Lungs clear to auscultation bilaterally.   No wheezes, crackles, or rhonchi.  Cardiovascular: Normal S1, S2. No MRG. Regular rate and rhythm. No peripheral edema, cyanosis or pallor.  Gastrointestinal:  Soft, nondistended, nontender. No rebound or guarding. Normal bowel sounds. No appreciable masses or hepatomegaly. Psychiatric: Demonstrates good judgement and reason without abnormal affect or behaviors.  RELEVANT LABS AND IMAGING: CBC    Component Value Date/Time   WBC 7.5 05/28/2019 1159   RBC 4.71 05/28/2019 1159   HGB 13.2 05/28/2019 1159   HCT 39.7 05/28/2019 1159   PLT 435.0 (H) 05/28/2019 1159   MCV 84.2  05/28/2019 1159   MCH 27.1 04/25/2019 0020   MCHC 33.2 05/28/2019 1159   RDW 14.5 05/28/2019 1159   LYMPHSABS 2.4 05/28/2019 1159   MONOABS 0.9 05/28/2019 1159   EOSABS 0.1 05/28/2019 1159   BASOSABS 0.0 05/28/2019 1159    CMP     Component Value Date/Time   NA 138 06/03/2019 1055   K 3.9 06/03/2019 1055   CL 98 06/03/2019 1055   CO2 22 06/03/2019 1055   GLUCOSE 113 (H) 06/03/2019 1055   GLUCOSE 70 05/28/2019 1159   BUN 7 (L) 06/03/2019 1055   CREATININE 1.01 06/03/2019 1055   CREATININE 0.82 11/27/2014 1558   CALCIUM 9.5 06/03/2019 1055   PROT 7.8 05/28/2019 1159   PROT 7.0 07/24/2018 1325   ALBUMIN 3.8 05/28/2019 1159   ALBUMIN 3.7 07/24/2018 1325   AST 21 05/28/2019 1159   ALT 23 05/28/2019 1159   ALKPHOS 76 05/28/2019 1159   BILITOT 0.4 05/28/2019 1159   BILITOT 0.3 07/24/2018 1325   GFRNONAA 72 06/03/2019 1055   GFRAA 84 06/03/2019 1055    Assessment: 1.  Ulcerative colitis: Controlled with mesalamine suppositories 1000 mg nightly  Plan: 1.  Refilled Mesalamine suppositories 1000 mg nightly #90 with 3 refills. 2.  Patient can follow-up with [place] in a year or sooner if necessary.  [name], PA-C [facility] 09/06/2019, 11:15 AM  Cc: [name], MD

## 2019-09-06 NOTE — Progress Notes (Signed)
Addendum: Reviewed and agree with assessment and management plan. Navy Belay M, MD  

## 2019-10-01 ENCOUNTER — Telehealth: Payer: Self-pay | Admitting: Family Medicine

## 2019-10-01 DIAGNOSIS — R441 Visual hallucinations: Secondary | ICD-10-CM

## 2019-10-01 NOTE — Telephone Encounter (Signed)
Patient called saying that he was told by his insurance that his PCP had to approve for patient to have occupational therapy. Patient states he needs to have therapy because of the effects he had when he was using drugs. Patient states he no longer uses drugs.

## 2019-10-02 NOTE — Telephone Encounter (Signed)
Patient is requesting referral for OT.

## 2019-10-03 NOTE — Telephone Encounter (Signed)
States that he is having problems with claustrophobia and would like help. States that it is an effect from his past drug use.

## 2019-10-03 NOTE — Telephone Encounter (Signed)
Can you please find out what symptoms he has so I can place his OT referral.

## 2019-10-07 NOTE — Telephone Encounter (Signed)
I spoke to the patient on the phone and he needs a referral because he 'sees things other people do not see'.  He attributes this to previous trauma and previous drug use.  I informed him I will be referring him to a Psychiatrist.

## 2019-10-22 ENCOUNTER — Ambulatory Visit: Payer: Medicare HMO | Attending: Family Medicine | Admitting: Family Medicine

## 2019-10-22 ENCOUNTER — Encounter: Payer: Self-pay | Admitting: Family Medicine

## 2019-10-22 ENCOUNTER — Other Ambulatory Visit: Payer: Self-pay

## 2019-10-22 VITALS — BP 130/71 | HR 82 | Ht 69.0 in | Wt 219.0 lb

## 2019-10-22 DIAGNOSIS — H539 Unspecified visual disturbance: Secondary | ICD-10-CM

## 2019-10-22 DIAGNOSIS — K5190 Ulcerative colitis, unspecified, without complications: Secondary | ICD-10-CM

## 2019-10-22 DIAGNOSIS — Z131 Encounter for screening for diabetes mellitus: Secondary | ICD-10-CM | POA: Diagnosis not present

## 2019-10-22 DIAGNOSIS — I10 Essential (primary) hypertension: Secondary | ICD-10-CM

## 2019-10-22 MED ORDER — AMLODIPINE BESYLATE 5 MG PO TABS: 5.0000 mg | ORAL_TABLET | Freq: Every day | ORAL | 1 refills | Status: DC

## 2019-10-22 NOTE — Progress Notes (Signed)
Subjective:  Patient ID: Robert Rhodes, male    DOB: 20-Mar-1946  Age: 74 y.o. MRN: [id number]  CC: Hypertension   HPI Robert Rhodes is a 74 year old male with a history of hypertension , ulcerative colitis (on mesalamine, currently followed by GI-Dr. Zenovia Jarred), bilateral knee osteoarthritis, COVID-19 pneumonia in 04/2019 who is here today for follow-up visit  Robert Rhodes thinks Robert Rhodes has a corneal abrasion. 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 Today is day 3 of his symptoms  His ulcerative colitis has been stable with no recent flares. Past Medical History:  Diagnosis Date  . Adenomatous colon polyp   . Arthritis   . Claustrophobia   . Colon cancer ([place]) 2015  . COVID-19   . Eye abnormalities    right eye seen in ED 02/17/2014 wearing eye patch using eye drops  . GERD (gastroesophageal reflux disease)   . Hypertension   . Irregular heartbeat    per pt/doesn't know what it does  . Osteoarthritis    bilat knees  needs replacement  . Osteoporosis    pt denies  . Substance abuse ([place])    stopped over 3 years ago  . UC (ulcerative colitis) ([place])   . Vitamin D deficiency     Past Surgical History:  Procedure Laterality Date  . COLON RESECTION N/A 08/23/2013   Procedure: Laparoscopic total abdominal colectomy and hernia repair;  Surgeon: Leighton Ruff, MD;  [facility]: [facility];  Service: General;  Laterality: N/A;  . COLON SURGERY  08/2013  . COLONOSCOPY    . EUS N/A 07/18/2013   Procedure: UPPER ENDOSCOPIC ULTRASOUND (EUS) RADIAL;  Surgeon: Milus Banister, MD;  [facility];  Service: Endoscopy;  Laterality: N/A;  . EYE SURGERY    . HERNIA REPAIR  2005   In [place] in [place] mesh  . INCISIONAL HERNIA REPAIR N/A 02/20/2014   Procedure: LAP ASSISTED INCISIONAL HERNIA REPAIR LYSIS  OF ADHESIONS;  Surgeon: Leighton Ruff, MD;  [facility];  Service: General;  Laterality: N/A;  converted to open @ 0935  . INCISIONAL HERNIA REPAIR N/A 02/20/2014   Procedure: HERNIA REPAIR INCISIONAL;  Surgeon: Leighton Ruff, MD;  [facility];  Service: General;  Laterality: N/A;  With MESH  . INTRAOCULAR LENS INSERTION Bilateral    6 yrs ago  . VENTRAL HERNIA REPAIR      Family History  Problem Relation Age of Onset  . Cancer Mother        type unknown  . Diabetes Father   . Heart disease Father   . Heart disease Sister   . Heart attack Brother   . Healthy Sister   . Colon cancer Neg Hx   . Esophageal cancer Neg Hx   . Rectal cancer Neg Hx   . Stomach cancer Neg Hx     Allergies  Allergen Reactions  . Ace Inhibitors Other (See Comments)    Unknown reaction  . Advil [Ibuprofen] Other (See Comments)    Muscle tightness  . Aspirin Nausea And Vomiting  . Tylenol [Acetaminophen] Other (See Comments)    Muscle tightness    Outpatient Medications Prior to Visit  Medication Sig Dispense Refill  . amLODipine (NORVASC) 5 MG tablet Take 1 tablet (5 mg total)  by mouth daily. Dx: Hypertension 90 tablet 1  . mesalamine (CANASA) 1000 MG suppository PLACE 1 SUPPOSITORY RECTALLY AT BEDTIME 90 suppository 3  . Misc. Devices MISC Blood pressure machine.  Diagnosis-hypertension 1 each 0  . Multiple Vitamin (MULTIVITAMIN WITH MINERALS) TABS tablet Take 1 tablet by mouth daily at 12 noon. (Patient taking differently: Take 1 tablet by mouth at bedtime. )    . Naproxen Sodium (ALEVE) 220 MG CAPS Take by mouth as needed.     . Omega 3 1200 MG CAPS Take 1,200 mg by mouth daily.      No facility-administered medications prior to visit.     ROS Review of Systems  Constitutional: Negative for activity change and appetite change.  HENT: Negative for sinus pressure and sore throat.   Eyes: Positive for visual disturbance.  Respiratory: Negative for cough, chest tightness and shortness  of breath.   Cardiovascular: Negative for chest pain and leg swelling.  Gastrointestinal: Negative for abdominal distention, abdominal pain, constipation and diarrhea.  Endocrine: Negative.   Genitourinary: Negative for dysuria.  Musculoskeletal: Negative for joint swelling and myalgias.  Skin: Negative for rash.  Allergic/Immunologic: Negative.   Neurological: Negative for weakness, light-headedness and numbness.  Psychiatric/Behavioral: Negative for dysphoric mood and suicidal ideas.    Objective:  BP 130/71   Pulse 82   Ht 5' 9"  (1.753 m)   Wt 219 lb (99.3 kg)   SpO2 98%   BMI 32.34 kg/m   BP/Weight 10/22/2019 09/06/2019 75/17/0017  Systolic BP 494 496 759  Diastolic BP 71 70 76  Wt. (Lbs) 219 218.4 211  BMI 32.34 32.25 31.16      Physical Exam Constitutional:      Appearance: Robert Rhodes is well-developed.  Eyes:     Comments: Erythematous right eye, L eye is normal Visual acuity - R 20/200  L 20/70  Neck:     Vascular: No JVD.  Cardiovascular:     Rate and Rhythm: Normal rate.     Heart sounds: Normal heart sounds. No murmur.  Pulmonary:     Effort: Pulmonary effort is normal.     Breath sounds: Normal breath sounds. No wheezing or rales.  Chest:     Chest wall: No tenderness.  Abdominal:     General: Bowel sounds are normal. There is no distension.     Palpations: Abdomen is soft. There is no mass.     Tenderness: There is no abdominal tenderness.  Musculoskeletal:        General: Normal range of motion.     Right lower leg: No edema.     Left lower leg: No edema.  Neurological:     Mental Status: Robert Rhodes is alert and oriented to person, place, and time.  Psychiatric:        Mood and Affect: Mood normal.     CMP Latest Ref Rng & Units 06/03/2019 05/28/2019 04/25/2019  Glucose 65 - 99 mg/dL 113(H) 70 104(H)  BUN 8 - 27 mg/dL 7(L) 9 14  Creatinine 0.76 - 1.27 mg/dL 1.01 0.96 1.03  Sodium 134 - 144 mmol/L 138 134(L) 134(L)  Potassium 3.5 - 5.2 mmol/L 3.9 3.9  3.4(L)  Chloride 96 - 106 mmol/L 98 101 97(L)  CO2 20 - 29 mmol/L 22 28 27   Calcium 8.6 - 10.2 mg/dL 9.5 9.4 8.9  Total Protein 6.0 - 8.3 g/dL - 7.8 7.2  Total Bilirubin 0.2 - 1.2 mg/dL - 0.4 0.6  Alkaline Phos 39 - 117 U/L - 76  75  AST 0 - 37 U/L - 21 67(H)  ALT 0 - 53 U/L - 23 67(H)    Lipid Panel     Component Value Date/Time   CHOL 186 03/05/2013 1021   TRIG 142 03/05/2013 1021   HDL 44 03/05/2013 1021   CHOLHDL 4.2 03/05/2013 1021   VLDL 28 03/05/2013 1021   LDLCALC 114 (H) 03/05/2013 1021    CBC    Component Value Date/Time   WBC 7.5 05/28/2019 1159   RBC 4.71 05/28/2019 1159   HGB 13.2 05/28/2019 1159   HCT 39.7 05/28/2019 1159   PLT 435.0 (H) 05/28/2019 1159   MCV 84.2 05/28/2019 1159   MCH 27.1 04/25/2019 0020   MCHC 33.2 05/28/2019 1159   RDW 14.5 05/28/2019 1159   LYMPHSABS 2.4 05/28/2019 1159   MONOABS 0.9 05/28/2019 1159   EOSABS 0.1 05/28/2019 1159   BASOSABS 0.0 05/28/2019 1159    No results found for: HGBA1C  Assessment & Plan:   1. Essential hypertension, benign Controlled Counseled on blood pressure goal of less than 130/80, low-sodium, DASH diet, medication compliance, 150 minutes of moderate intensity exercise per week. Discussed medication compliance, adverse effects. - CMP14+EGFR - amLODipine (NORVASC) 5 MG tablet; Take 1 tablet (5 mg total) by mouth daily. Dx: Hypertension  Dispense: 90 tablet; Refill: 1  2. Screening for diabetes mellitus - Hemoglobin A1c  3. Ulcerative colitis without complications, unspecified location (HCC) Stable Continue mesalamine - CBC with Differential/Platelet  4. Abnormal vision Will need to exclude CRAO Will call over to [facility] eye care to obtain an urgent appointment - Ambulatory referral to Ophthalmology     [name], MD, FAAFP. [facility] [place], [place]   10/22/2019, 9:52 AM

## 2019-10-23 LAB — CMP14+EGFR
ALT: 28 IU/L (ref 0–44)
AST: 28 IU/L (ref 0–40)
Albumin/Globulin Ratio: 1 — ABNORMAL LOW (ref 1.2–2.2)
Albumin: 3.9 g/dL (ref 3.7–4.7)
Alkaline Phosphatase: 101 IU/L (ref 39–117)
BUN/Creatinine Ratio: 10 (ref 10–24)
BUN: 11 mg/dL (ref 8–27)
Bilirubin Total: 0.4 mg/dL (ref 0.0–1.2)
CO2: 24 mmol/L (ref 20–29)
Calcium: 10 mg/dL (ref 8.6–10.2)
Chloride: 103 mmol/L (ref 96–106)
Creatinine, Ser: 1.06 mg/dL (ref 0.76–1.27)
GFR calc Af Amer: 79 mL/min/{1.73_m2} (ref >59)
GFR calc non Af Amer: 68 mL/min/{1.73_m2} (ref >59)
Globulin, Total: 3.8 g/dL (ref 1.5–4.5)
Glucose: 83 mg/dL (ref 65–99)
Potassium: 4.6 mmol/L (ref 3.5–5.2)
Sodium: 139 mmol/L (ref 134–144)
Total Protein: 7.7 g/dL (ref 6.0–8.5)

## 2019-10-23 LAB — CBC WITH DIFFERENTIAL/PLATELET
Basophils Absolute: 0.1 10*3/uL (ref 0.0–0.2)
Basos: 1 %
EOS (ABSOLUTE): 0.2 10*3/uL (ref 0.0–0.4)
Eos: 3 %
Hematocrit: 41.4 % (ref 37.5–51.0)
Hemoglobin: 13.6 g/dL (ref 13.0–17.7)
Immature Grans (Abs): 0 10*3/uL (ref 0.0–0.1)
Immature Granulocytes: 0 %
Lymphocytes Absolute: 2.6 10*3/uL (ref 0.7–3.1)
Lymphs: 36 %
MCH: 28.2 pg (ref 26.6–33.0)
MCHC: 32.9 g/dL (ref 31.5–35.7)
MCV: 86 fL (ref 79–97)
Monocytes Absolute: 0.9 10*3/uL (ref 0.1–0.9)
Monocytes: 12 %
Neutrophils Absolute: 3.6 10*3/uL (ref 1.4–7.0)
Neutrophils: 48 %
Platelets: 429 10*3/uL (ref 150–450)
RBC: 4.82 x10E6/uL (ref 4.14–5.80)
RDW: 14.4 % (ref 11.6–15.4)
WBC: 7.4 10*3/uL (ref 3.4–10.8)

## 2019-10-23 LAB — HEMOGLOBIN A1C
Est. average glucose Bld gHb Est-mCnc: 111 mg/dL
Hgb A1c MFr Bld: 5.5 % (ref 4.8–5.6)

## 2019-10-25 ENCOUNTER — Telehealth: Payer: Self-pay

## 2019-10-25 NOTE — Telephone Encounter (Signed)
Patient name and DOB has been verified Patient was informed of lab results. Patient had no questions.  

## 2019-10-25 NOTE — Telephone Encounter (Signed)
-----   Message from [name], MD sent at 10/24/2019  8:59 AM EDT ----- Please inform the patient that labs are normal. Thank you.

## 2019-11-05 ENCOUNTER — Ambulatory Visit: Payer: Medicare HMO | Admitting: Family Medicine

## 2019-11-08 DIAGNOSIS — H40052 Ocular hypertension, left eye: Secondary | ICD-10-CM | POA: Diagnosis not present

## 2019-11-08 DIAGNOSIS — H31013 Macula scars of posterior pole (postinflammatory) (post-traumatic), bilateral: Secondary | ICD-10-CM | POA: Diagnosis not present

## 2019-11-08 DIAGNOSIS — H31022 Solar retinopathy, left eye: Secondary | ICD-10-CM | POA: Diagnosis not present

## 2019-11-08 DIAGNOSIS — H40013 Open angle with borderline findings, low risk, bilateral: Secondary | ICD-10-CM | POA: Diagnosis not present

## 2019-11-13 DIAGNOSIS — H2000 Unspecified acute and subacute iridocyclitis: Secondary | ICD-10-CM | POA: Diagnosis not present

## 2019-11-13 DIAGNOSIS — H40052 Ocular hypertension, left eye: Secondary | ICD-10-CM | POA: Diagnosis not present

## 2019-11-22 DIAGNOSIS — H40052 Ocular hypertension, left eye: Secondary | ICD-10-CM | POA: Diagnosis not present

## 2019-11-22 DIAGNOSIS — H2000 Unspecified acute and subacute iridocyclitis: Secondary | ICD-10-CM | POA: Diagnosis not present

## 2019-11-27 ENCOUNTER — Encounter: Payer: Self-pay | Admitting: Family Medicine

## 2019-11-27 ENCOUNTER — Other Ambulatory Visit: Payer: Self-pay

## 2019-11-27 ENCOUNTER — Ambulatory Visit: Payer: Medicare HMO | Attending: Family Medicine | Admitting: Family Medicine

## 2019-11-27 VITALS — BP 124/70 | HR 85 | Ht 69.0 in | Wt 218.0 lb

## 2019-11-27 DIAGNOSIS — Z23 Encounter for immunization: Secondary | ICD-10-CM | POA: Diagnosis not present

## 2019-11-27 DIAGNOSIS — L304 Erythema intertrigo: Secondary | ICD-10-CM

## 2019-11-27 DIAGNOSIS — Z0000 Encounter for general adult medical examination without abnormal findings: Secondary | ICD-10-CM | POA: Diagnosis not present

## 2019-11-27 MED ORDER — CLOTRIMAZOLE 1 % EX CREA: 1.0000 "application " | TOPICAL_CREAM | Freq: Two times a day (BID) | CUTANEOUS | 1 refills | Status: DC

## 2019-11-27 NOTE — Progress Notes (Signed)
Referral to dermatology for rash on lower abdomen.

## 2019-11-27 NOTE — Progress Notes (Signed)
Subjective:   Robert Rhodes is a 74 y.o. male who presents for Medicare Annual/Subsequent preventive examination.  Review of Systems:  General: negative for fever, weight loss, appetite change Eyes: no visual symptoms. ENT: no ear symptoms, no sinus tenderness, no nasal congestion or sore throat. Neck: no pain  Respiratory: no wheezing, shortness of breath, cough Cardiovascular: no chest pain, no dyspnea on exertion, no pedal edema, no orthopnea. Gastrointestinal: no abdominal pain, no diarrhea, no constipation Genito-Urinary: no urinary frequency, no dysuria, no polyuria. Hematologic: no bruising Endocrine: no cold or heat intolerance Neurological: no headaches, no seizures, no tremors Musculoskeletal: no joint pains, no joint swelling Skin: Rash in inferior fold of abdominal wall Psychological: no depression, no anxiety,          Objective:    Vitals: BP 124/70   Pulse 85   Ht 5' 9"  (1.753 m)   Wt 218 lb (98.9 kg)   SpO2 99%   BMI 32.19 kg/m   Body mass index is 32.19 kg/m.  Advanced Directives 11/27/2019 04/20/2019 04/19/2019 06/01/2016 08/06/2015 06/18/2015 03/06/2015  Does Patient Have a Medical Advance Directive? No No No No No No No  Would patient like information on creating a medical advance directive? - No - Patient declined No - Patient declined - No - patient declined information - No - patient declined information  Pre-existing out of facility DNR order (yellow form or pink MOST form) - - - - - - -    Tobacco Social History   Tobacco Use  Smoking Status Former Smoker  . Packs/day: 0.00  . Types: Cigarettes  . Quit date: 06/14/2003  . Years since quitting: 16.4  Smokeless Tobacco Never Used  Tobacco Comment   quit in July 2005     Counseling given: Not Answered Comment: quit in July 2005   Clinical Intake:  Pre-visit preparation completed: Yes  Pain : 0-10 Pain Score: 0-No pain     Diabetes: No     Interpreter Needed?: No     Past  Medical History:  Diagnosis Date  . Adenomatous colon polyp   . Arthritis   . Claustrophobia   . Colon cancer ([place]) 2015  . COVID-19   . Eye abnormalities    right eye seen in ED 02/17/2014 wearing eye patch using eye drops  . GERD (gastroesophageal reflux disease)   . Hypertension   . Irregular heartbeat    per pt/doesn't know what it does  . Osteoarthritis    bilat knees  needs replacement  . Osteoporosis    pt denies  . Substance abuse ([place])    stopped over 3 years ago  . UC (ulcerative colitis) ([place])   . Vitamin D deficiency    Past Surgical History:  Procedure Laterality Date  . COLON RESECTION N/A 08/23/2013   Procedure: Laparoscopic total abdominal colectomy and hernia repair;  Surgeon: Leighton Ruff, MD;  [facility];  Service: General;  Laterality: N/A;  . COLON SURGERY  08/2013  . COLONOSCOPY    . EUS N/A 07/18/2013   Procedure: UPPER ENDOSCOPIC ULTRASOUND (EUS) RADIAL;  Surgeon: Milus Banister, MD;  [facility];  Service: Endoscopy;  Laterality: N/A;  . EYE SURGERY    . HERNIA REPAIR  2005   [place] in [place]  . INCISIONAL HERNIA REPAIR N/A 02/20/2014   Procedure: LAP ASSISTED INCISIONAL HERNIA REPAIR LYSIS OF ADHESIONS;  Surgeon: Leighton Ruff, MD;  [facility];  Service: General;  Laterality: N/A;  converted to open @ 0935  . INCISIONAL HERNIA REPAIR N/A 02/20/2014   Procedure: HERNIA REPAIR INCISIONAL;  Surgeon: Leighton Ruff, MD;  [facility];  Service: General;  Laterality: N/A;  With MESH  . INTRAOCULAR LENS INSERTION Bilateral    6 yrs ago  . VENTRAL HERNIA REPAIR     Family History  Problem Relation Age of Onset  . Cancer Mother        type unknown  . Diabetes Father   . Heart disease Father   . Heart disease Sister   . Heart attack Brother   . Healthy Sister   . Colon cancer Neg Hx   . Esophageal cancer Neg Hx   . Rectal cancer Neg Hx   . Stomach cancer Neg Hx    Social History   Socioeconomic History  . Marital  status: Single    Spouse name: Not on file  . Number of children: 0  . Years of education: Not on file  . Highest education level: Not on file  Occupational History  . Occupation: retired  Tobacco Use  . Smoking status: Former Smoker    Packs/day: 0.00    Types: Cigarettes    Quit date: 06/14/2003    Years since quitting: 16.4  . Smokeless tobacco: Never Used  . Tobacco comment: quit in July 2005  Vaping Use  . Vaping Use: Never used  Substance and Sexual Activity  . Alcohol use: No  . Drug use: Not Currently    Types: 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  . Sexual activity: Yes  Other Topics Concern  . Not on file  Social History Narrative  . Not on file   Social Determinants of Health   Financial Resource Strain:   . Difficulty of Paying Living Expenses:   Food Insecurity:   . Worried About Charity fundraiser in the Last Year:   . Arboriculturist in the Last Year:   Transportation Needs:   . Film/video editor (Medical):   Marland Kitchen Lack of Transportation (Non-Medical):   Physical Activity:   . Days of Exercise per Week:   . Minutes of Exercise per Session:   Stress:   . Feeling of Stress :   Social Connections:   . Frequency of Communication with Friends and Family:   . Frequency of Social Gatherings with Friends and Family:   . Attends Religious Services:   . Active Member of Clubs or Organizations:   . Attends Archivist Meetings:   Marland Kitchen Marital Status:     Outpatient Encounter Medications as of 11/27/2019  Medication Sig  . amLODipine (NORVASC) 5 MG tablet Take 1 tablet (5 mg total) by mouth daily. Dx: Hypertension  . mesalamine (CANASA) 1000 MG suppository PLACE 1 SUPPOSITORY RECTALLY AT BEDTIME  . Misc. Devices MISC Blood pressure machine.  Diagnosis-hypertension  . Multiple Vitamin (MULTIVITAMIN WITH MINERALS) TABS tablet Take 1 tablet by mouth daily at 12 noon. (Patient taking differently: Take 1 tablet by mouth at bedtime. )  .  Naproxen Sodium (ALEVE) 220 MG CAPS Take by mouth as needed.   . Omega 3 1200 MG CAPS Take 1,200 mg by mouth daily.   . clotrimazole (LOTRIMIN) 1 % cream Apply 1 application topically 2 (two) times daily.   No facility-administered encounter medications on file as of 11/27/2019.    Activities of Daily Living In your present state of health, do you have any difficulty performing the following  activities: 11/27/2019 04/21/2019  Hearing? N N  Vision? N N  Difficulty concentrating or making decisions? N N  Walking or climbing stairs? N Y  Dressing or bathing? N N  Doing errands, shopping? N N  Some recent data might be hidden    Constitutional: normal appearing,  Eyes: PERRLA HEENT: Head is atraumatic, normal sinuses, normal oropharynx, normal appearing tonsils and palate, tympanic membrane is normal bilaterally. Neck: normal range of motion, no thyromegaly, no JVD Cardiovascular: normal rate and rhythm, normal heart sounds, no murmurs, rub or gallop, no pedal edema Respiratory: Normal breath sounds, clear to auscultation bilaterally, no wheezes, no rales, no rhonchi Abdomen: soft, not tender to palpation, normal bowel sounds, no enlarged organs Musculoskeletal: Full ROM, no tenderness in joints Skin: Intertriginous rash in the lower abdominal fold Genitourinary: Condyloma acuminata in left upper groin muscle.  Normal testicles and penis Neurological: alert, oriented x3, cranial nerves I-XII grossly intact , normal motor strength, normal sensation. Psychological: normal mood.    Patient Care Team: [name], MD as PCP - General (Family Medicine) Ladell Pier, MD as Consulting Physician (Oncology)   Assessment:   This is a routine wellness examination for Shomari.  Exercise Activities and Dietary recommendations    Goals   None     Fall Risk Fall Risk  11/27/2019 07/10/2018 02/07/2018 11/08/2017 07/28/2017  Falls in the past year? 0 0 No No No  Risk for fall due to : No  Fall Risks - - - -   Is the patient's home free of loose throw rugs in walkways, pet beds, electrical cords, etc?   yes      Grab bars in the bathroom? no      Handrails on the stairs?   n/a      Adequate lighting?   yes  Timed Get Up and Go Performed: yes  Depression Screen PHQ 2/9 Scores 11/27/2019 07/10/2018 11/08/2017 07/28/2017  PHQ - 2 Score 0 0 4 0  PHQ- 9 Score - 1 6 3     Cognitive Function MMSE - Mini Mental State Exam 11/27/2019  Orientation to time 5  Orientation to Place 5  Registration 3  Attention/ Calculation 5  Recall 3  Language- name 2 objects 2  Language- repeat 1  Language- follow 3 step command 3  Language- read & follow direction 1  Write a sentence 1  Copy design 1  Total score 30        Immunization History  Administered Date(s) Administered  . PPD Test 02/25/2014    Qualifies for Shingles Vaccine? yes  Screening Tests Health Maintenance  Topic Date Due  . Hepatitis C Screening  Never done  . COVID-19 Vaccine (1) Never done  . TETANUS/TDAP  Never done  . PNA vac Low Risk Adult (1 of 2 - PCV13) Never done  . COLONOSCOPY  06/15/2018  . INFLUENZA VACCINE  01/12/2020   Cancer Screenings: Lung: Low Dose CT Chest recommended if Age 21-80 years, 30 pack-year currently smoking OR have quit w/in 15years. Patient does not qualify. Colorectal: due in 07/2020  Additional Screenings: due Hepatitis C Screening:      Plan:    1. Encounter for Medicare annual wellness exam Counseled on 150 minutes of exercise per week, healthy eating (including decreased daily intake of saturated fats, cholesterol, added sugars, sodium), routine healthcare maintenance.   2. Intertrigo Placed on clotrimazole   I have personally reviewed and noted the following in the patient's chart:   . Medical  and social history . Use of alcohol, tobacco or illicit drugs  . Current medications and supplements . Functional ability and status . Nutritional status . Physical  activity . Advanced directives . List of other physicians . Hospitalizations, surgeries, and ER visits in previous 12 months . Vitals . Screenings to include cognitive, depression, and falls . Referrals and appointments  In addition, I have reviewed and discussed with patient certain preventive protocols, quality metrics, and best practice recommendations. A written personalized care plan for preventive services as well as general preventive health recommendations were provided to patient.     [name], MD  11/27/2019

## 2019-11-27 NOTE — Patient Instructions (Addendum)
Robert Rhodes , Thank you for taking time to come for your Medicare Wellness Visit. I appreciate your ongoing commitment to your health goals. Please review the following plan we discussed and let me know if I can assist you in the future.   These are the goals we discussed: Goals   None     This is a list of the screening recommended for you and due dates:  Health Maintenance  Topic Date Due  .  Hepatitis C: One time screening is recommended by Center for Disease Control  (CDC) for  adults born from [birth year] through 1967.   Never done  . COVID-19 Vaccine (1) Never done  . Tetanus Vaccine  Never done  . Pneumonia vaccines (1 of 2 - PCV13) Never done  . Colon Cancer Screening  06/15/2018  . Flu Shot  01/12/2020  Pneumococcal Conjugate Vaccine (PCV13): What You Need to Know 1. Why get vaccinated? Pneumococcal conjugate vaccine (PCV13) can prevent pneumococcal disease. Pneumococcal disease refers to any illness caused by pneumococcal bacteria. These bacteria can cause many types of illnesses, including pneumonia, which is an infection of the lungs. Pneumococcal bacteria are one of the most common causes of pneumonia. Besides pneumonia, pneumococcal bacteria can also cause:  Ear infections  Sinus infections  Meningitis (infection of the tissue covering the brain and spinal cord)  Bacteremia (bloodstream infection) Anyone can get pneumococcal disease, but children under 105 years of age, people with certain medical conditions, adults 34 years or older, and cigarette smokers are at the highest risk. Most pneumococcal infections are mild. However, some can result in long-term problems, such as brain damage or hearing loss. Meningitis, bacteremia, and pneumonia caused by pneumococcal disease can be fatal. 2. PCV13 PCV13 protects against 13 types of bacteria that cause pneumococcal disease. Infants and young children usually need 4 doses of pneumococcal conjugate vaccine, at 2, 4, 6, and 79-70  months of age. In some cases, a child might need fewer than 4 doses to complete PCV13 vaccination. A dose of PCV23 vaccine is also recommended for anyone 2 years or older with certain medical conditions if they did not already receive PCV13. This vaccine may be given to adults 49 years or older based on discussions between the patient and health care provider. 3. Talk with your health care provider Tell your vaccine provider if the person getting the vaccine:  Has had an allergic reaction after a previous dose of PCV13, to an earlier pneumococcal conjugate vaccine known as PCV7, or to any vaccine containing diphtheria toxoid (for example, DTaP), or has any severe, life-threatening allergies.  In some cases, your health care provider may decide to postpone PCV13 vaccination to a future visit. People with minor illnesses, such as a cold, may be vaccinated. People who are moderately or severely ill should usually wait until they recover before getting PCV13. Your health care provider can give you more information. 4. Risks of a vaccine reaction  Redness, swelling, pain, or tenderness where the shot is given, and fever, loss of appetite, fussiness (irritability), feeling tired, headache, and chills can happen after PCV13. Young children may be at increased risk for seizures caused by fever after PCV13 if it is administered at the same time as inactivated influenza vaccine. Ask your health care provider for more information. People sometimes faint after medical procedures, including vaccination. Tell your provider if you feel dizzy or have vision changes or ringing in the ears. As with any medicine, there is a very remote chance  of a vaccine causing a severe allergic reaction, other serious injury, or death. 5. What if there is a serious problem? An allergic reaction could occur after the vaccinated person leaves the clinic. If you see signs of a severe allergic reaction (hives, swelling of the face and  throat, difficulty breathing, a fast heartbeat, dizziness, or weakness), call 9-1-1 and get the person to the nearest hospital. For other signs that concern you, call your health care provider. Adverse reactions should be reported to the Vaccine Adverse Event Reporting System (VAERS). Your health care provider will usually file this report, or you can do it yourself. Visit the VAERS website at www.vaers.SamedayNews.es or call 780-665-8996. VAERS is only for reporting reactions, and VAERS staff do not give medical advice. 6. The National Vaccine Injury Compensation Program The [employer] Vaccine Injury Compensation Program (VICP) is a federal program that was created to compensate people who may have been injured by certain vaccines. Visit the VICP website at GoldCloset.com.ee or call 318-193-8401 to learn about the program and about filing a claim. There is a time limit to file a claim for compensation. 7. How can I learn more?  Ask your health care provider.  Call your local or state health department.  Contact the Centers for Disease Control and Prevention (CDC): ? Call 667-867-8678 (1-800-CDC-INFO) or ? Visit CDC's website at http://hunter.com/ Vaccine Information Statement PCV13 Vaccine (04/11/2018) This information is not intended to replace advice given to you by your health care provider. Make sure you discuss any questions you have with your health care provider. Document Revised: 09/18/2018 Document Reviewed: 01/09/2018 Elsevier Patient Education  [place]. https://www.cdc.gov/vaccines/hcp/vis/vis-statements/tdap.pdf">  Tdap (Tetanus, Diphtheria, Pertussis) Vaccine: What You Need to Know 1. Why get vaccinated? Tdap vaccine can prevent tetanus, diphtheria, and pertussis. Diphtheria and pertussis spread from person to person. Tetanus enters the body through cuts or wounds.  TETANUS (T) causes painful stiffening of the muscles. Tetanus can lead to serious health  problems, including being unable to open the mouth, having trouble swallowing and breathing, or death.  DIPHTHERIA (D) can lead to difficulty breathing, heart failure, paralysis, or death.  PERTUSSIS (aP), also known as "whooping cough," can cause uncontrollable, violent coughing which makes it hard to breathe, eat, or drink. Pertussis can be extremely serious in babies and young children, causing pneumonia, convulsions, brain damage, or death. In teens and adults, it can cause weight loss, loss of bladder control, passing out, and rib fractures from severe coughing. 2. Tdap vaccine Tdap is only for children 7 years and older, adolescents, and adults.  Adolescents should receive a single dose of Tdap, preferably at age 74 or 45 years. Pregnant women should get a dose of Tdap during every pregnancy, to protect the newborn from pertussis. Infants are most at risk for severe, life-threatening complications from pertussis. Adults who have never received Tdap should get a dose of Tdap. Also, adults should receive a booster dose every 10 years, or earlier in the case of a severe and dirty wound or burn. Booster doses can be either Tdap or Td (a different vaccine that protects against tetanus and diphtheria but not pertussis). Tdap may be given at the same time as other vaccines. 3. Talk with your health care provider Tell your vaccine provider if the person getting the vaccine:  Has had an allergic reaction after a previous dose of any vaccine that protects against tetanus, diphtheria, or pertussis, or has any severe, life-threatening allergies.  Has had a coma, decreased level  of consciousness, or prolonged seizures within 7 days after a previous dose of any pertussis vaccine (DTP, DTaP, or Tdap).  Has seizures or another nervous system problem.  Has ever had Guillain-Barr Syndrome (also called GBS).  Has had severe pain or swelling after a previous dose of any vaccine that protects against  tetanus or diphtheria. In some cases, your health care provider may decide to postpone Tdap vaccination to a future visit.  People with minor illnesses, such as a cold, may be vaccinated. People who are moderately or severely ill should usually wait until they recover before getting Tdap vaccine.  Your health care provider can give you more information. 4. Risks of a vaccine reaction  Pain, redness, or swelling where the shot was given, mild fever, headache, feeling tired, and nausea, vomiting, diarrhea, or stomachache sometimes happen after Tdap vaccine. People sometimes faint after medical procedures, including vaccination. Tell your provider if you feel dizzy or have vision changes or ringing in the ears.  As with any medicine, there is a very remote chance of a vaccine causing a severe allergic reaction, other serious injury, or death. 5. What if there is a serious problem? An allergic reaction could occur after the vaccinated person leaves the clinic. If you see signs of a severe allergic reaction (hives, swelling of the face and throat, difficulty breathing, a fast heartbeat, dizziness, or weakness), call 9-1-1 and get the person to the nearest hospital. For other signs that concern you, call your health care provider.  Adverse reactions should be reported to the Vaccine Adverse Event Reporting System (VAERS). Your health care provider will usually file this report, or you can do it yourself. Visit the VAERS website at www.vaers.SamedayNews.es or call 618 626 1569. VAERS is only for reporting reactions, and VAERS staff do not give medical advice. 6. The National Vaccine Injury Compensation Program The [employer] Vaccine Injury Compensation Program (VICP) is a federal program that was created to compensate people who may have been injured by certain vaccines. Visit the VICP website at GoldCloset.com.ee or call 640-653-4117 to learn about the program and about filing a claim. There is a  time limit to file a claim for compensation. 7. How can I learn more?  Ask your health care provider.  Call your local or state health department.  Contact the Centers for Disease Control and Prevention (CDC): ? Call (401)202-4245 (1-800-CDC-INFO) or ? Visit CDC's website at http://hunter.com/ Vaccine Information Statement Tdap (Tetanus, Diphtheria, Pertussis) Vaccine (09/12/2018) This information is not intended to replace advice given to you by your health care provider. Make sure you discuss any questions you have with your health care provider. Document Revised: 09/21/2018 Document Reviewed: 09/24/2018 Elsevier Patient Education  [place].

## 2020-01-02 DIAGNOSIS — H40052 Ocular hypertension, left eye: Secondary | ICD-10-CM | POA: Diagnosis not present

## 2020-01-02 DIAGNOSIS — H40013 Open angle with borderline findings, low risk, bilateral: Secondary | ICD-10-CM | POA: Diagnosis not present

## 2020-04-15 IMAGING — CT CT ANGIO NECK
1 of 11 series · 5 of 33 positions shown · IV contrast (omnipaque)
Comparison: None.

CLINICAL DATA: Dizziness. Body aches.

EXAM:
CT ANGIOGRAPHY HEAD AND NECK
TECHNIQUE: Multidetector CT imaging of the head and neck was performed using
the standard protocol during bolus administration of intravenous
contrast. Multiplanar CT image reconstructions and MIPs were
obtained to evaluate the vascular anatomy. Carotid stenosis
measurements (when applicable) are obtained utilizing NASCET
criteria, using the distal internal carotid diameter as the
denominator.
CONTRAST:  75 mL Omnipaque 350

[Series 12: cta neck axial · axial · 0.34mm/px · z∈[-288,-61]mm · 5 of 341 slices shown]
[im 57/341  soft-tissue]
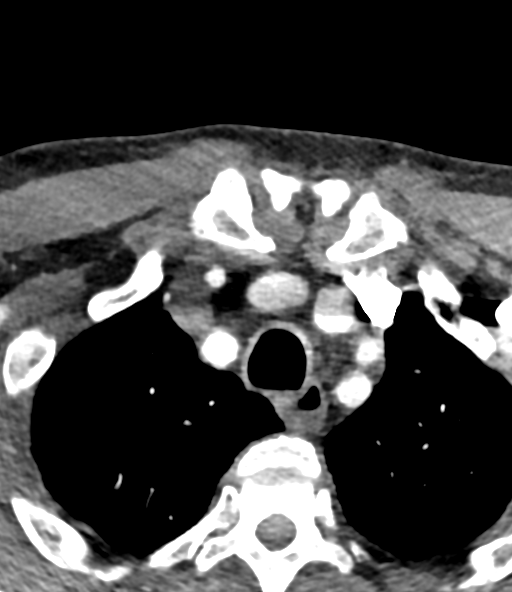
[im 114/341  bone]
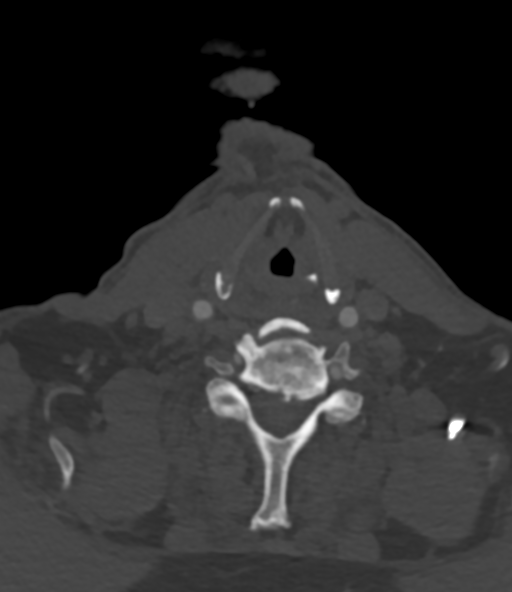
[im 171/341  soft-tissue]
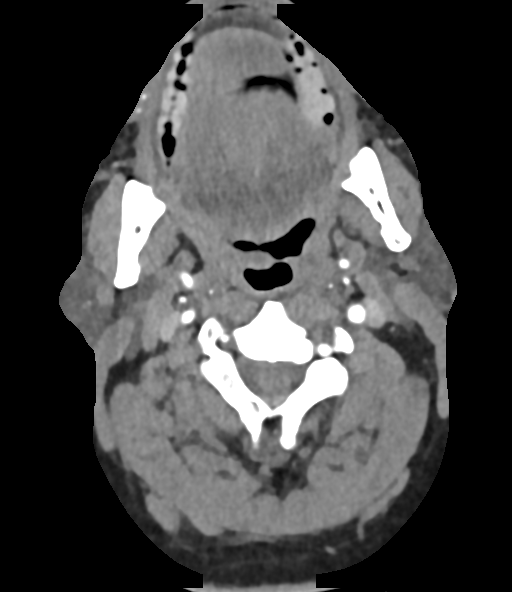
[im 227/341  bone]
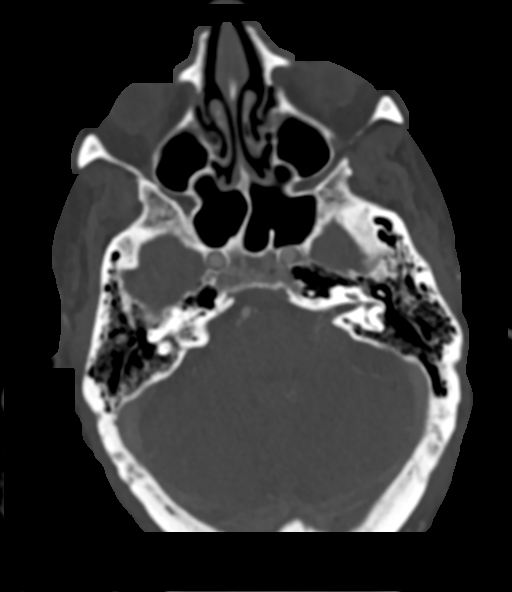
[im 284/341  soft-tissue]
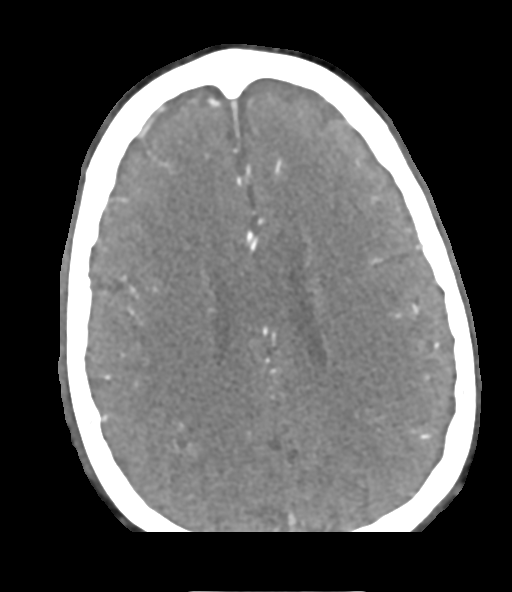

[5 of 33 positions shown; findings below may reference images not displayed]

FINDINGS: CT HEAD FINDINGS

Brain: There is no evidence of acute infarct, intracranial
hemorrhage, mass, midline shift, or extra-axial fluid collection.
The ventricles and sulci are within normal limits for age.

Vascular: Calcified atherosclerosis at the skull base.

Skull: No fracture or focal osseous lesion.

Sinuses: Mild mucosal thickening in the paranasal sinuses. Clear
mastoid air cells.

Orbits: Bilateral cataract extraction.

Review of the MIP images confirms the above findings

CTA NECK FINDINGS

Aortic arch: Normal variant aortic arch branching pattern with
common origin of the brachiocephalic and left common carotid
arteries. Mild arch atherosclerosis without arch vessel origin
stenosis.

Right carotid system: Patent with minimal calcified plaque at the
carotid bifurcation. No evidence of dissection or stenosis. Tortuous
distal cervical ICA.

Left carotid system: Patent without evidence of dissection or
stenosis. Tortuous distal cervical ICA.

Vertebral arteries: Patent with the left being strongly dominant. No
evidence of dissection or stenosis.

Skeleton: Advanced cervical disc degeneration with mild multilevel
listhesis.

Other neck: No evidence of cervical lymphadenopathy or mass.

Upper chest: Mild centrilobular emphysema.

Review of the MIP images confirms the above findings

CTA HEAD FINDINGS

Anterior circulation: The internal carotid arteries are patent from
skull base to carotid termini with minimal nonstenotic
atherosclerosis. ACAs and MCAs are patent with mild branch vessel
irregularity but no evidence of proximal branch occlusion or
significant proximal stenosis. No aneurysm is identified.

Posterior circulation: The intracranial vertebral arteries are
widely patent to the basilar. Patent PICA, AICA, and SCA origins are
seen bilaterally. The basilar artery is widely patent. There are
left larger than right posterior communicating arteries. The PCAs
are patent with mild irregularity but no significant proximal
stenosis. No aneurysm is identified.

Venous sinuses: As permitted by contrast timing, patent.

Anatomic variants: None.

Review of the MIP images confirms the above findings
IMPRESSION: 1. Unremarkable CT appearance of the brain. No evidence of acute
intracranial abnormality.
2. Minimal atherosclerosis in the head and neck without large vessel
occlusion or significant stenosis.
3.  Aortic Atherosclerosis ([id number]).

## 2020-04-16 IMAGING — DX DG CHEST 1V PORT
1 series · 1 of 1 positions shown · non-contrast
Comparison: February 27, 2014

CLINICAL DATA: Covid positive

EXAM:
PORTABLE CHEST 1 VIEW

[chest ap]
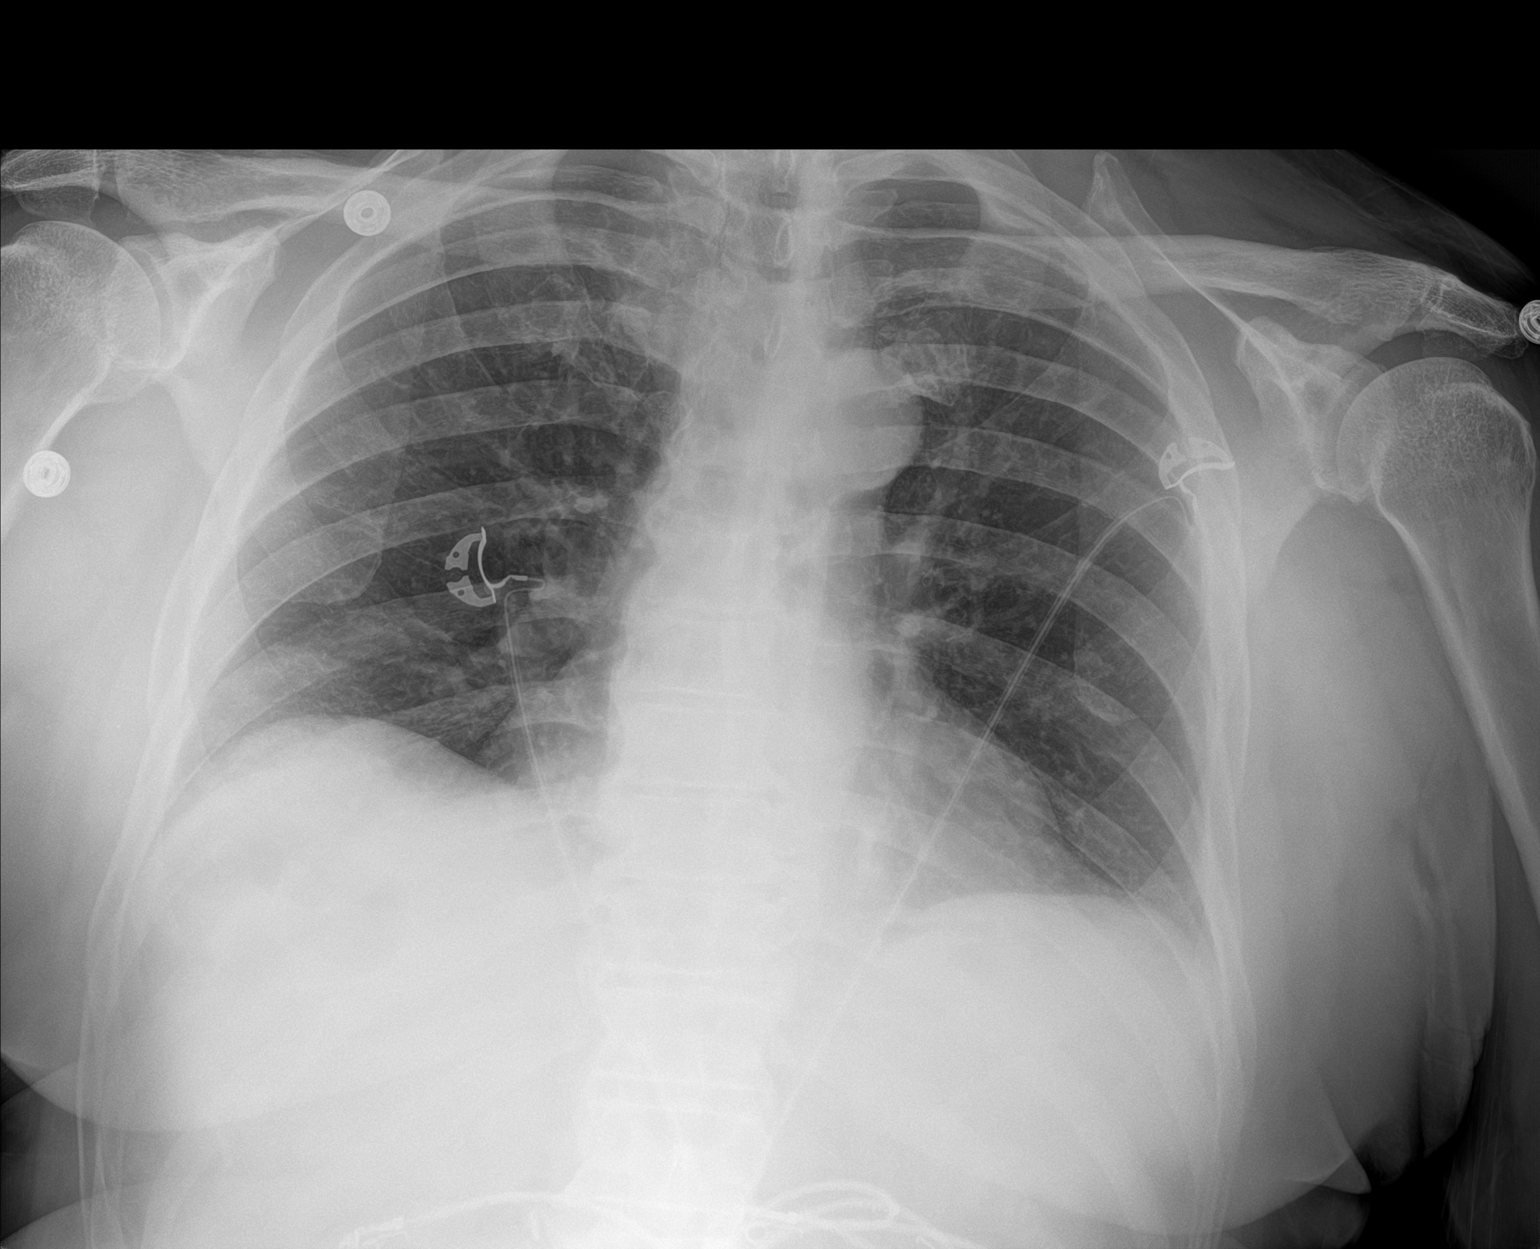

[1 of 1 positions shown; findings below may reference images not displayed]

FINDINGS: The heart size and mediastinal contours are within normal limits.
There is subtle patchy airspace opacity seen at the right lung base.
The left lung is clear. No pleural effusion. No acute osseous
abnormality.
IMPRESSION: Patchy airspace opacity at the right lung base. The findings in the
lungs are nonspecific, but concerning for atypical infection, which
includes viral pneumonia.

## 2020-06-18 DIAGNOSIS — H35361 Drusen (degenerative) of macula, right eye: Secondary | ICD-10-CM | POA: Diagnosis not present

## 2020-06-18 DIAGNOSIS — H31023 Solar retinopathy, bilateral: Secondary | ICD-10-CM | POA: Diagnosis not present

## 2020-06-18 DIAGNOSIS — H40013 Open angle with borderline findings, low risk, bilateral: Secondary | ICD-10-CM | POA: Diagnosis not present

## 2020-06-18 DIAGNOSIS — H40052 Ocular hypertension, left eye: Secondary | ICD-10-CM | POA: Diagnosis not present

## 2020-08-18 ENCOUNTER — Telehealth: Payer: Self-pay | Admitting: Physician Assistant

## 2020-08-19 NOTE — Telephone Encounter (Signed)
PA submitted via Covermymeds.

## 2020-08-19 NOTE — Telephone Encounter (Signed)
PA for Mesalamine has been approved until 06/12/21

## 2020-08-20 NOTE — Telephone Encounter (Signed)
Patient informed of approval.

## 2020-09-14 DIAGNOSIS — F209 Schizophrenia, unspecified: Secondary | ICD-10-CM | POA: Diagnosis not present

## 2020-10-09 ENCOUNTER — Encounter: Payer: Self-pay | Admitting: Internal Medicine

## 2020-10-19 ENCOUNTER — Encounter: Payer: Medicare HMO | Admitting: Internal Medicine

## 2020-10-21 ENCOUNTER — Ambulatory Visit (AMBULATORY_SURGERY_CENTER): Payer: Medicare HMO

## 2020-10-21 ENCOUNTER — Other Ambulatory Visit: Payer: Self-pay

## 2020-10-21 VITALS — Ht 69.0 in | Wt 210.0 lb

## 2020-10-21 DIAGNOSIS — K5190 Ulcerative colitis, unspecified, without complications: Secondary | ICD-10-CM

## 2020-10-21 DIAGNOSIS — Z85038 Personal history of other malignant neoplasm of large intestine: Secondary | ICD-10-CM

## 2020-10-21 MED ORDER — NA SULFATE-K SULFATE-MG SULF 17.5-3.13-1.6 GM/177ML PO SOLN: 1.0000 | Freq: Once | ORAL | 0 refills | Status: AC

## 2020-10-21 NOTE — Progress Notes (Signed)
Pre visit completed via phone; Patient verified name, DOB, and address; No egg or soy allergy known to patient  No issues with past sedation with any surgeries or procedures Patient denies ever being told they had issues or difficulty with intubation  No FH of Malignant Hyperthermia No diet pills per patient No home 02 use per patient  No blood thinners per patient  Pt denies issues with constipation  No A fib or A flutter  COVID 19 guidelines implemented in PV today with Pt and RN  NO PA's for preps discussed with pt in PV today  Discussed with pt there will be an out-of-pocket cost for prep and that varies from $0 to 70 dollars  Due to the COVID-19 pandemic we are asking patients to follow certain guidelines.  Pt aware of COVID protocols and [facility] guidelines

## 2020-10-29 DIAGNOSIS — H40013 Open angle with borderline findings, low risk, bilateral: Secondary | ICD-10-CM | POA: Diagnosis not present

## 2020-11-02 ENCOUNTER — Encounter: Payer: Self-pay | Admitting: Internal Medicine

## 2020-11-02 ENCOUNTER — Other Ambulatory Visit: Payer: Self-pay

## 2020-11-02 ENCOUNTER — Ambulatory Visit (AMBULATORY_SURGERY_CENTER): Payer: Medicare HMO | Admitting: Internal Medicine

## 2020-11-02 VITALS — BP 118/67 | HR 81 | Temp 97.5°F | Resp 30 | Ht 69.0 in | Wt 210.0 lb

## 2020-11-02 DIAGNOSIS — D129 Benign neoplasm of anus and anal canal: Secondary | ICD-10-CM

## 2020-11-02 DIAGNOSIS — K51019 Ulcerative (chronic) pancolitis with unspecified complications: Secondary | ICD-10-CM

## 2020-11-02 DIAGNOSIS — K5190 Ulcerative colitis, unspecified, without complications: Secondary | ICD-10-CM | POA: Diagnosis not present

## 2020-11-02 DIAGNOSIS — Z85038 Personal history of other malignant neoplasm of large intestine: Secondary | ICD-10-CM | POA: Diagnosis not present

## 2020-11-02 DIAGNOSIS — K621 Rectal polyp: Secondary | ICD-10-CM | POA: Diagnosis not present

## 2020-11-02 DIAGNOSIS — D128 Benign neoplasm of rectum: Secondary | ICD-10-CM | POA: Diagnosis not present

## 2020-11-02 DIAGNOSIS — K6289 Other specified diseases of anus and rectum: Secondary | ICD-10-CM | POA: Diagnosis not present

## 2020-11-02 HISTORY — PX: COLONOSCOPY: SHX174

## 2020-11-02 MED ORDER — MESALAMINE 1000 MG RE SUPP: 1000.0000 mg | Freq: Every day | RECTAL | 3 refills | Status: DC

## 2020-11-02 MED ORDER — SODIUM CHLORIDE 0.9 % IV SOLN: 500.0000 mL | Freq: Once | INTRAVENOUS | Status: DC

## 2020-11-02 MED ORDER — MESALAMINE 1000 MG RE SUPP: 1000.0000 mg | Freq: Every day | RECTAL | 1 refills | Status: DC

## 2020-11-02 MED ORDER — MESALAMINE 1000 MG RE SUPP: RECTAL | 3 refills | Status: DC

## 2020-11-02 NOTE — Progress Notes (Signed)
pt tolerated well. VSS. awake and to recovery. Report given to RN.  

## 2020-11-02 NOTE — Progress Notes (Signed)
Called to room to assist during endoscopic procedure.  Patient ID and intended procedure confirmed with present staff. Received instructions for my participation in the procedure from the performing physician.  

## 2020-11-02 NOTE — Patient Instructions (Signed)
Handout given for polyps.  Pick up refill for suppositories to take nightly.  YOU HAD AN ENDOSCOPIC PROCEDURE TODAY AT [facility]:   Refer to the procedure report that was given to you for any specific questions about what was found during the examination.  If the procedure report does not answer your questions, please call your gastroenterologist to clarify.  If you requested that your care partner not be given the details of your procedure findings, then the procedure report has been included in a sealed envelope for you to review at your convenience later.  YOU SHOULD EXPECT: Some feelings of bloating in the abdomen. Passage of more gas than usual.  Walking can help get rid of the air that was put into your GI tract during the procedure and reduce the bloating. If you had a lower endoscopy (such as a colonoscopy or flexible sigmoidoscopy) you may notice spotting of blood in your stool or on the toilet paper. If you underwent a bowel prep for your procedure, you may not have a normal bowel movement for a few days.  Please Note:  You might notice some irritation and congestion in your nose or some drainage.  This is from the oxygen used during your procedure.  There is no need for concern and it should clear up in a day or so.  SYMPTOMS TO REPORT IMMEDIATELY:   Following lower endoscopy (colonoscopy or flexible sigmoidoscopy):  Excessive amounts of blood in the stool  Significant tenderness or worsening of abdominal pains  Swelling of the abdomen that is new, acute  Fever of 100F or higher  For urgent or emergent issues, a gastroenterologist can be reached at any hour by calling [phone]. Do not use MyChart messaging for urgent concerns.    DIET:  We do recommend a small meal at first, but then you may proceed to your regular diet.  Drink plenty of fluids but you should avoid alcoholic beverages for 24 hours.  ACTIVITY:  You should plan to take it easy for the rest  of today and you should NOT DRIVE or use heavy machinery until tomorrow (because of the sedation medicines used during the test).    FOLLOW UP: Our staff will call the number listed on your records 48-72 hours following your procedure to check on you and address any questions or concerns that you may have regarding the information given to you following your procedure. If we do not reach you, we will leave a message.  We will attempt to reach you two times.  During this call, we will ask if you have developed any symptoms of COVID 19. If you develop any symptoms (ie: fever, flu-like symptoms, shortness of breath, cough etc.) before then, please call [phone].  If you test positive for Covid 19 in the 2 weeks post procedure, please call and report this information to [place].    If any biopsies were taken you will be contacted by phone or by letter within the next 1-3 weeks.  Please call us at [phone] if you have not heard about the biopsies in 3 weeks.    SIGNATURES/CONFIDENTIALITY: You and/or your care partner have signed paperwork which will be entered into your electronic medical record.  These signatures attest to the fact that that the information above on your After Visit Summary has been reviewed and is understood.  Full responsibility of the confidentiality of this discharge information lies with you and/or your care-partner.

## 2020-11-02 NOTE — Op Note (Signed)
[place] Patient Name: Robert Rhodes Procedure Date: 11/02/2020 11:21 AM MRN: [id number] Endoscopist: Jerene Bears , MD Age: 75 Referring MD:  Date of Birth: 1945-06-25 Gender: Male Account #: [id number] Procedure:                Flexible Sigmoidoscopy Indications:              Personal history of malignant neoplasm of the                            colon, Personal history of ulcerative colitis s/p                            subtotal colectomy with ileorectal anastomosis in                            2015, flexible sigmoidoscopy for surveillance 2016,                            2018, and 2020; currently on Canasa 1 g qHS Medicines:                Monitored Anesthesia Care Procedure:                Pre-Anesthesia Assessment:                           - Prior to the procedure, a History and Physical                            was performed, and patient medications and                            allergies were reviewed. The patient's tolerance of                            previous anesthesia was also reviewed. The risks                            and benefits of the procedure and the sedation                            options and risks were discussed with the patient.                            All questions were answered, and informed consent                            was obtained. Prior Anticoagulants: The patient has                            taken no previous anticoagulant or antiplatelet                            agents. ASA Grade Assessment: III - A patient with  severe systemic disease. After reviewing the risks                            and benefits, the patient was deemed in                            satisfactory condition to undergo the procedure.                           After obtaining informed consent, the scope was                            passed under direct vision. The Olympus PCF-H190DL                             (#[id number]) Colonoscope was introduced through the                            anus and advanced to the ileo-rectal anastomosis.                            The flexible sigmoidoscopy was accomplished without                            difficulty. The patient tolerated the procedure                            well. The quality of the bowel preparation was good. Scope In: 11:34:32 AM Scope Out: 14:78:29 AM Scope Withdrawal Time: 0 hours 21 minutes 6 seconds  Total Procedure Duration: 0 hours 21 minutes 39 seconds  Findings:                 There was evidence of a prior end-to-side                            ileo-rectal anastomosis in the proximal rectum.                            This was patent and was characterized by healthy                            appearing mucosa. The anastomosis was traversed.                           The ileum appeared normal.                           A 8 mm polyp was found in the proximal rectum. The                            polyp was sessile. The polyp was removed with a hot                            snare. Resection and retrieval were  complete.                           Inflammation was found in a continuous and                            circumferential pattern from the rectum to the                            terminal ileum. This was graded as Mayo Score 2                            (moderate, with marked erythema, absent vascular                            pattern, friability, erosions, with scattered                            ulcerations). Biopsies were taken with a cold                            forceps for histology. There was persistent                            bleeding from a biopsy site which did not cease                            with observation. For hemostasis, two hemostatic                            clips were successfully placed (MR conditional).                            There was no bleeding at the end of the maneuver.                            Retroflexion in the rectum was not performed due to                            post-surgical anatomy. Complications:            No immediate complications. Estimated Blood Loss:     Estimated blood loss: 50 mL requiring treatment                            with placement of hemostatic clip(s). Impression:               - Patent end-to-side ileo-rectal anastomosis,                            characterized by healthy appearing mucosa.                           - The terminal ileum is normal.                           -  One 8 mm polyp in the proximal rectum, removed                            with a hot snare. Resected and retrieved.                           - Moderately active (Mayo Score 2) proctitis                            ulcerative colitis. Biopsied and Clips (MR                            conditional) were placed at 1 surveillance biopsy                            site. Recommendation:           - Patient has a contact number available for                            emergencies. The signs and symptoms of potential                            delayed complications were discussed with the                            patient. Return to normal activities tomorrow.                            Written discharge instructions were provided to the                            patient.                           - Resume previous diet.                           - Continue present medications.                           - Await pathology results.                           - Repeat flexible sigmoidoscopy for surveillance                            based on pathology results. Jerene Bears, MD 11/02/2020 12:05:27 PM This report has been signed electronically.

## 2020-11-02 NOTE — Progress Notes (Signed)
Pt's states no medical or surgical changes since previsit or office visit.  Vitals [place]

## 2020-11-04 ENCOUNTER — Telehealth: Payer: Self-pay | Admitting: *Deleted

## 2020-11-04 NOTE — Telephone Encounter (Signed)
Unable to leave message,mailbox full.

## 2020-11-10 ENCOUNTER — Encounter: Payer: Self-pay | Admitting: Internal Medicine

## 2020-11-12 ENCOUNTER — Ambulatory Visit: Payer: Medicare HMO | Attending: Family Medicine | Admitting: Family Medicine

## 2020-11-12 ENCOUNTER — Encounter: Payer: Self-pay | Admitting: Family Medicine

## 2020-11-12 ENCOUNTER — Other Ambulatory Visit: Payer: Self-pay

## 2020-11-12 VITALS — BP 131/74 | HR 86 | Ht 69.0 in | Wt 216.2 lb

## 2020-11-12 DIAGNOSIS — I10 Essential (primary) hypertension: Secondary | ICD-10-CM

## 2020-11-12 DIAGNOSIS — Z1159 Encounter for screening for other viral diseases: Secondary | ICD-10-CM

## 2020-11-12 DIAGNOSIS — Z0001 Encounter for general adult medical examination with abnormal findings: Secondary | ICD-10-CM | POA: Diagnosis not present

## 2020-11-12 DIAGNOSIS — Z0000 Encounter for general adult medical examination without abnormal findings: Secondary | ICD-10-CM

## 2020-11-12 MED ORDER — AMLODIPINE BESYLATE 5 MG PO TABS: 5.0000 mg | ORAL_TABLET | Freq: Every day | ORAL | 1 refills | Status: DC

## 2020-11-12 NOTE — Patient Instructions (Signed)
  Robert Rhodes , Thank you for taking time to come for your Medicare Wellness Visit. I appreciate your ongoing commitment to your health goals. Please review the following plan we discussed and let me know if I can assist you in the future.   These are the goals we discussed: Goals    . DIET - EAT MORE FRUITS AND VEGETABLES    . Exercise 150 min/wk Moderate Activity       This is a list of the screening recommended for you and due dates:  Health Maintenance  Topic Date Due  . Hepatitis C Screening: USPSTF Recommendation to screen - Ages 64-79 yo.  Never done  . Zoster (Shingles) Vaccine (2 of 2) 07/15/2019  . Pneumonia vaccines (2 of 2 - PPSV23) 11/26/2020  . Flu Shot  01/11/2021  . Colon Cancer Screening  11/03/2022  . Tetanus Vaccine  11/26/2029  . COVID-19 Vaccine  Completed  . HPV Vaccine  Aged Out

## 2020-11-12 NOTE — Progress Notes (Signed)
Subjective:   Robert Rhodes is a 75 y.o. male who presents for Medicare Annual/Subsequent preventive examination.  His medical history is significant for hypertension and he endorses compliance with amlodipine and has no adverse effects from it.  He exercises regularly by going to the gym and he takes walks.  He is also compliant with a low-sodium diet and his diet consists of vegetables.  Review of Systems    General: negative for fever, weight loss, appetite change Eyes: no visual symptoms. ENT: no ear symptoms, no sinus tenderness, no nasal congestion or sore throat. Neck: no pain  Respiratory: no wheezing, shortness of breath, cough Cardiovascular: no chest pain, no dyspnea on exertion, no pedal edema, no orthopnea. Gastrointestinal: no abdominal pain, no diarrhea, no constipation Genito-Urinary: no urinary frequency, no dysuria, no polyuria. Hematologic: no bruising Endocrine: no cold or heat intolerance Neurological: no headaches, no seizures, no tremors Musculoskeletal: no joint pains, no joint swelling Skin: no pruritus, no rash. Psychological: no depression, no anxiety,          Objective:    Today's Vitals   11/12/20 1017  BP: 131/74  Pulse: 86  SpO2: 99%  Weight: 216 lb 3.2 oz (98.1 kg)  Height: 5' 9"  (1.753 m)   Body mass index is 31.93 kg/m.  Advanced Directives 11/12/2020 11/27/2019 04/20/2019 04/19/2019 06/01/2016 08/06/2015 06/18/2015  Does Patient Have a Medical Advance Directive? No No No No No No No  Would patient like information on creating a medical advance directive? - - No - Patient declined No - Patient declined - No - patient declined information -  Pre-existing out of facility DNR order (yellow form or pink MOST form) - - - - - - -    Current Medications (verified) Outpatient Encounter Medications as of 11/12/2020  Medication Sig  . mesalamine (CANASA) 1000 MG suppository Place 1 suppository (1,000 mg total) rectally at bedtime.  . Misc. Devices  MISC Blood pressure machine.  Diagnosis-hypertension  . Multiple Vitamin (MULTIVITAMIN WITH MINERALS) TABS tablet Take 1 tablet by mouth daily at 12 noon. (Patient taking differently: Take 1 tablet by mouth at bedtime.)  . Omega 3 1200 MG CAPS Take 1,200 mg by mouth daily.  Marland Kitchen amLODipine (NORVASC) 5 MG tablet Take 1 tablet (5 mg total) by mouth daily. Dx: Hypertension  . Naproxen Sodium 220 MG CAPS Take by mouth as needed.  (Patient not taking: Reported on 11/12/2020)  . prednisoLONE acetate (PRED FORTE) 1 % ophthalmic suspension Place 1 drop into the right eye 4 (four) times daily. (Patient not taking: Reported on 11/12/2020)  . [DISCONTINUED] amLODipine (NORVASC) 5 MG tablet Take 1 tablet (5 mg total) by mouth daily. Dx: Hypertension   No facility-administered encounter medications on file as of 11/12/2020.    Allergies (verified) Ace inhibitors, Advil [ibuprofen], Aspirin, and Tylenol [acetaminophen]   History: Past Medical History:  Diagnosis Date  . Adenomatous colon polyp   . Arthritis    bilateral knees-needs replacements  . Claustrophobia   . Colon cancer ([place]) 2015  . COVID-19   . Eye abnormalities    right eye seen in ED 02/17/2014 wearing eye patch using eye drops  . GERD (gastroesophageal reflux disease)    with certain foods  . Glaucoma    not on eye drops at this time (10/21/2020)  . Hypertension    on meds  . Irregular heartbeat    per pt/doesn't know what it does  . Osteoarthritis    bilat knees  needs  replacement  . Osteoporosis    pt denies  . Substance abuse ([place])    stopped over 3 years ago  . UC (ulcerative colitis) ([place])   . Vitamin D deficiency    Past Surgical History:  Procedure Laterality Date  . COLON RESECTION N/A 08/23/2013   Procedure: Laparoscopic total abdominal colectomy and hernia repair;  Surgeon: Leighton Ruff, MD;  Location: [facility];  Service: General;  Laterality: N/A;  . COLON SURGERY  08/2013  . COLONOSCOPY  11/02/2020  . EUS N/A 07/18/2013    Procedure: UPPER ENDOSCOPIC ULTRASOUND (EUS) RADIAL;  Surgeon: Milus Banister, MD;  Location: [facility];  Service: Endoscopy;  Laterality: N/A;  . EYE SURGERY    . FLEXIBLE SIGMOIDOSCOPY N/A 2020   prep good  . HERNIA REPAIR  2005   In [place]/put in a mesh  . INCISIONAL HERNIA REPAIR N/A 02/20/2014   Procedure: LAP ASSISTED INCISIONAL HERNIA REPAIR LYSIS OF ADHESIONS;  Surgeon: Leighton Ruff, MD;  Location: [facility];  Service: General;  Laterality: N/A;  converted to open @ 0935  . INCISIONAL HERNIA REPAIR N/A 02/20/2014   Procedure: HERNIA REPAIR INCISIONAL;  Surgeon: Leighton Ruff, MD;  Location: [facility];  Service: General;  Laterality: N/A;  With MESH  . INTRAOCULAR LENS INSERTION Bilateral    6 yrs ago  . VENTRAL HERNIA REPAIR     Family History  Problem Relation Age of Onset  . Cancer Mother        type unknown-in her leg  . Diabetes Father   . Heart disease Father   . Heart disease Sister   . Heart attack Brother   . Healthy Sister   . Colon cancer Neg Hx   . Esophageal cancer Neg Hx   . Rectal cancer Neg Hx   . Stomach cancer Neg Hx   . Colon polyps Neg Hx    Social History   Socioeconomic History  . Marital status: Single    Spouse name: Not on file  . Number of children: 0  . Years of education: Not on file  . Highest education level: Not on file  Occupational History  . Occupation: retired  Tobacco Use  . Smoking status: Former Smoker    Packs/day: 0.00    Types: Cigarettes    Quit date: 06/14/2003    Years since quitting: 17.4  . Smokeless tobacco: Never Used  . Tobacco comment: quit in July 2005  Vaping Use  . Vaping Use: Never used  Substance and Sexual Activity  . Alcohol use: No  . Drug use: Not Currently    Types: 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  . Sexual activity: Yes  Other Topics Concern  . Not on file  Social History Narrative  . Not on file   Social Determinants of Health   Financial Resource Strain: Not on file  Food  Insecurity: Not on file  Transportation Needs: Not on file  Physical Activity: Not on file  Stress: Not on file  Social Connections: Not on file    Tobacco Counseling Counseling given: Not Answered Comment: quit in July 2005   Clinical Intake:  Pre-visit preparation completed: No  Pain : No/denies pain     Diabetes: No      Interpreter Needed?: No      Activities of Daily Living In your present state of health, do you have any difficulty performing the following activities: 11/12/2020 11/27/2019  Hearing? N N  Vision? N N  Difficulty concentrating or making decisions? N N  Walking or climbing stairs? N N  Dressing or bathing? N N  Doing errands, shopping? N N  Preparing Food and eating ? N -  Using the Toilet? N -  In the past six months, have you accidently leaked urine? N -  Do you have problems with loss of bowel control? N -  Managing your Medications? N -  Managing your Finances? N -  Housekeeping or managing your Housekeeping? N -  Some recent data might be hidden    Patient Care Team: [name], MD as PCP - General (Family Medicine) Ladell Pier, MD as Consulting Physician (Oncology)  Indicate any recent Medical Services you may have received from other than [facility] providers in the past year (date may be approximate).     Assessment:   This is a routine wellness examination for Rajat.  Hearing/Vision screen No exam data present  Dietary issues and exercise activities discussed:    Goals Addressed            This Visit's Progress   . DIET - EAT MORE FRUITS AND VEGETABLES      . Exercise 150 min/wk Moderate Activity        Depression Screen PHQ 2/9 Scores 11/12/2020 11/27/2019 07/10/2018 11/08/2017 07/28/2017 08/06/2015 06/18/2015  PHQ - 2 Score 0 0 0 4 0 0 0  PHQ- 9 Score - - 1 6 3  - -    Fall Risk Fall Risk  11/12/2020 11/27/2019 07/10/2018 02/07/2018 11/08/2017  Falls in the past year? 0 0 0 No No  Number falls in past yr: 0 - - - -   Injury with Fall? 0 - - - -  Risk for fall due to : - No Fall Risks - - -    FALL RISK PREVENTION PERTAINING TO THE HOME:  Any stairs in or around the home? No  If so, are there any without handrails? No  Home free of loose throw rugs in walkways, pet beds, electrical cords, etc? Yes  Adequate lighting in your home to reduce risk of falls? Yes   ASSISTIVE DEVICES UTILIZED TO PREVENT FALLS:  Life alert? No  Use of a cane, walker or w/c? Yes  Grab bars in the bathroom? Yes  Shower chair or bench in shower? No  Elevated toilet seat or a handicapped toilet? No   TIMED UP AND GO:  Was the test performed? Yes .  Length of time to ambulate 10 feet: 8 sec.   Gait slow and steady with assistive device  Cognitive Function: MMSE - Mini Mental State Exam 11/27/2019  Orientation to time 5  Orientation to Place 5  Registration 3  Attention/ Calculation 5  Recall 3  Language- name 2 objects 2  Language- repeat 1  Language- follow 3 step command 3  Language- read & follow direction 1  Write a sentence 1  Copy design 1  Total score 30        Immunizations Immunization History  Administered Date(s) Administered  . Influenza, High Dose Seasonal PF 03/15/2019  . PFIZER(Purple Top)SARS-COV-2 Vaccination 07/18/2019, 08/08/2019, 03/07/2020  . PPD Test 02/25/2014  . Pneumococcal Conjugate-13 05/20/2019, 11/27/2019  . Tdap 11/27/2019  . Zoster Recombinat (Shingrix) 05/20/2019     { Covid-19 vaccine status: Completed vaccines  Qualifies for Shingles Vaccine? Yes   Zostavax completed Yes   Shingrix Completed?: Yes  Received 2 doses of Shingles [facility] - will bring in record  Screening Tests  Health Maintenance  Topic Date Due  . Hepatitis C Screening  Never done  . Zoster Vaccines- Shingrix (2 of 2) 07/15/2019  . PNA vac Low Risk Adult (2 of 2 - PPSV23) 11/26/2020  . INFLUENZA VACCINE  01/11/2021  . COLONOSCOPY (Pts 45-51yr Insurance coverage will need to be  confirmed)  11/03/2022  . TETANUS/TDAP  11/26/2029  . COVID-19 Vaccine  Completed  . HPV VACCINES  Aged Out    Health Maintenance  Health Maintenance Due  Topic Date Due  . Hepatitis C Screening  Never done  . Zoster Vaccines- Shingrix (2 of 2) 07/15/2019    Colorectal cancer screening: Type of screening: Sigmoidoscopy. Completed 11/02/20. Repeat every 2 years  Lung Cancer Screening: (Low Dose CT Chest recommended if Age 77-80years, 30 pack-year currently smoking OR have quit w/in 15years.) does not qualify.   Lung Cancer Screening Referral: N/a  Additional Screening:  Hepatitis C Screening: does qualify; Completed - ordered today  Dental Screening: Recommended annual dental exams for proper oral hygiene  Community Resource Referral / Chronic Care Management: CRR required this visit?  No   CCM required this visit?  No      Plan:     I have personally reviewed and noted the following in the patient's chart:   . Medical and social history . Use of alcohol, tobacco or illicit drugs  . Current medications and supplements including opioid prescriptions. Patient is not currently taking opioid prescriptions. . Functional ability and status . Nutritional status . Physical activity . Advanced directives . List of other physicians . Hospitalizations, surgeries, and ER visits in previous 12 months . Vitals . Screenings to include cognitive, depression, and falls . Referrals and appointments  In addition, I have reviewed and discussed with patient certain preventive protocols, quality metrics, and best practice recommendations. A written personalized care plan for preventive services as well as general preventive health recommendations were provided to patient.     ECharlott Rakes MD   11/12/2020

## 2020-11-13 ENCOUNTER — Other Ambulatory Visit: Payer: Medicare HMO

## 2020-11-17 ENCOUNTER — Other Ambulatory Visit: Payer: Self-pay

## 2020-11-17 ENCOUNTER — Ambulatory Visit: Payer: Medicare HMO | Attending: Family Medicine

## 2020-11-17 DIAGNOSIS — Z1159 Encounter for screening for other viral diseases: Secondary | ICD-10-CM | POA: Diagnosis not present

## 2020-11-17 DIAGNOSIS — I10 Essential (primary) hypertension: Secondary | ICD-10-CM

## 2020-11-18 ENCOUNTER — Other Ambulatory Visit: Payer: Self-pay | Admitting: Family Medicine

## 2020-11-18 MED ORDER — ATORVASTATIN CALCIUM 20 MG PO TABS: 20.0000 mg | ORAL_TABLET | Freq: Every day | ORAL | 3 refills | Status: DC

## 2020-11-19 ENCOUNTER — Telehealth: Payer: Self-pay

## 2020-11-19 LAB — LIPID PANEL
Chol/HDL Ratio: 4.6 ratio (ref 0.0–5.0)
Cholesterol, Total: 183 mg/dL (ref 100–199)
HDL: 40 mg/dL (ref >39)
LDL Chol Calc (NIH): 124 mg/dL — ABNORMAL HIGH (ref 0–99)
Triglycerides: 106 mg/dL (ref 0–149)
VLDL Cholesterol Cal: 19 mg/dL (ref 5–40)

## 2020-11-19 LAB — CMP14+EGFR
ALT: 18 IU/L (ref 0–44)
AST: 23 IU/L (ref 0–40)
Albumin/Globulin Ratio: 1 — ABNORMAL LOW (ref 1.2–2.2)
Albumin: 4 g/dL (ref 3.7–4.7)
Alkaline Phosphatase: 83 IU/L (ref 44–121)
BUN/Creatinine Ratio: 4 — ABNORMAL LOW (ref 10–24)
BUN: 4 mg/dL — ABNORMAL LOW (ref 8–27)
Bilirubin Total: 0.3 mg/dL (ref 0.0–1.2)
CO2: 24 mmol/L (ref 20–29)
Calcium: 9.5 mg/dL (ref 8.6–10.2)
Chloride: 102 mmol/L (ref 96–106)
Creatinine, Ser: 1.07 mg/dL (ref 0.76–1.27)
Globulin, Total: 4 g/dL (ref 1.5–4.5)
Glucose: 84 mg/dL (ref 65–99)
Potassium: 4.3 mmol/L (ref 3.5–5.2)
Sodium: 138 mmol/L (ref 134–144)
Total Protein: 8 g/dL (ref 6.0–8.5)
eGFR: 71 mL/min/{1.73_m2} (ref >59)

## 2020-11-19 LAB — HCV RNA QUANT RFLX ULTRA OR GENOTYP: HCV Quant Baseline: NOT DETECTED IU/mL

## 2020-11-19 NOTE — Telephone Encounter (Signed)
Patient name and DOB has been verified Patient was informed of lab results. Patient had no questions.  

## 2020-11-19 NOTE — Telephone Encounter (Signed)
-----   Message from [name], MD sent at 11/18/2020 12:53 PM EDT ----- Please advise him his LDL (bad cholesterol) is elevated and I have sent in a rx for Atorvastatin to his Pharmacy and compliance with a low cholesterol diet will be beneficial. Other labs are stable.

## 2020-11-20 DIAGNOSIS — H40011 Open angle with borderline findings, low risk, right eye: Secondary | ICD-10-CM | POA: Diagnosis not present

## 2020-12-04 DIAGNOSIS — H40012 Open angle with borderline findings, low risk, left eye: Secondary | ICD-10-CM | POA: Diagnosis not present

## 2021-01-29 DIAGNOSIS — H40013 Open angle with borderline findings, low risk, bilateral: Secondary | ICD-10-CM | POA: Diagnosis not present

## 2021-03-08 DIAGNOSIS — F209 Schizophrenia, unspecified: Secondary | ICD-10-CM | POA: Diagnosis not present

## 2021-03-22 DIAGNOSIS — F209 Schizophrenia, unspecified: Secondary | ICD-10-CM | POA: Diagnosis not present

## 2021-05-17 ENCOUNTER — Ambulatory Visit: Payer: Medicare HMO | Admitting: Family Medicine

## 2021-05-21 ENCOUNTER — Telehealth: Payer: Self-pay | Admitting: Family Medicine

## 2021-05-21 NOTE — Telephone Encounter (Signed)
Copied from [place] [id number]. Topic: General - Other >> May 19, 2021  3:37 PM Pawlus, Brayton Layman A wrote: Reason for CRM: Pt called in asking if Dr Margarita Rana could possibly write an order for a mattress, please advise.

## 2021-05-21 NOTE — Telephone Encounter (Signed)
Pt was called and informed that his insurance will not pay for a mattress, they will pay for a hospital bed but his has no indication for a hospital bed.

## 2021-07-01 DIAGNOSIS — H40013 Open angle with borderline findings, low risk, bilateral: Secondary | ICD-10-CM | POA: Diagnosis not present

## 2021-07-01 DIAGNOSIS — H35361 Drusen (degenerative) of macula, right eye: Secondary | ICD-10-CM | POA: Diagnosis not present

## 2021-07-01 DIAGNOSIS — H2000 Unspecified acute and subacute iridocyclitis: Secondary | ICD-10-CM | POA: Diagnosis not present

## 2021-07-01 DIAGNOSIS — H31013 Macula scars of posterior pole (postinflammatory) (post-traumatic), bilateral: Secondary | ICD-10-CM | POA: Diagnosis not present

## 2021-07-15 ENCOUNTER — Other Ambulatory Visit: Payer: Self-pay | Admitting: Family Medicine

## 2021-07-15 ENCOUNTER — Telehealth: Payer: Self-pay | Admitting: *Deleted

## 2021-07-15 DIAGNOSIS — I10 Essential (primary) hypertension: Secondary | ICD-10-CM

## 2021-07-15 NOTE — Telephone Encounter (Signed)
Requested Prescriptions  Pending Prescriptions Disp Refills   amLODipine (NORVASC) 5 MG tablet [Pharmacy Med Name: amLODIPine Besylate 5 MG Oral Tablet] 90 tablet 0    Sig: TAKE 1 TABLET BY MOUTH ONCE DAILY- HYPERTENSION     Cardiovascular: Calcium Channel Blockers 2 Failed - 07/15/2021  4:57 AM      Failed - Valid encounter within last 6 months    Recent Outpatient Visits          8 months ago Encounter for [employer] annual wellness exam   [facility] [place], Charlane Ferretti, MD   1 year ago Encounter for [employer] annual wellness exam   [facility] [place], Charlane Ferretti, MD   1 year ago Screening for diabetes mellitus   [place], Enobong, MD   2 years ago Essential hypertension, benign   [place], Enobong, MD   3 years ago Essential hypertension, benign   [place] [name], MD      Future Appointments            In 2 months [name], MD [place] BP in normal range    BP Readings from Last 1 Encounters:  11/12/20 131/74         Passed - Last Heart Rate in normal range    Pulse Readings from Last 1 Encounters:  11/12/20 86

## 2021-07-15 NOTE — Telephone Encounter (Signed)
I called and scheduled him with Dr. Margarita Rana for his 6 month check and refills.  09/13/2021 at 2:10.  Courtesy refill given for amlodipine 5 mg.

## 2021-09-13 ENCOUNTER — Telehealth: Payer: Self-pay | Admitting: Family Medicine

## 2021-09-13 ENCOUNTER — Ambulatory Visit: Payer: Medicare HMO | Admitting: Family Medicine

## 2021-09-13 NOTE — Telephone Encounter (Signed)
Copied from [place] [id number]. Topic: Appointment Scheduling - Scheduling Inquiry for Clinic >> Sep 13, 2021  2:28 PM Tessa Lerner A wrote: Reason for CRM: The patient would like to speak with a member of staff when possible  The patient has additional questions related to their physical as well as scheduling an orthopedic procedure for their knee  Please contact further

## 2021-09-14 NOTE — Telephone Encounter (Signed)
Patient has been scheduled an earlier appointment to discuss concerns. ?

## 2021-09-16 ENCOUNTER — Ambulatory Visit: Payer: Medicare HMO | Attending: Family Medicine | Admitting: Family Medicine

## 2021-09-16 ENCOUNTER — Encounter: Payer: Self-pay | Admitting: Family Medicine

## 2021-09-16 VITALS — BP 131/73 | HR 88 | Ht 69.0 in | Wt 215.6 lb

## 2021-09-16 DIAGNOSIS — F250 Schizoaffective disorder, bipolar type: Secondary | ICD-10-CM

## 2021-09-16 DIAGNOSIS — K5190 Ulcerative colitis, unspecified, without complications: Secondary | ICD-10-CM

## 2021-09-16 DIAGNOSIS — Z9189 Other specified personal risk factors, not elsewhere classified: Secondary | ICD-10-CM

## 2021-09-16 DIAGNOSIS — Z23 Encounter for immunization: Secondary | ICD-10-CM

## 2021-09-16 DIAGNOSIS — M170 Bilateral primary osteoarthritis of knee: Secondary | ICD-10-CM

## 2021-09-16 DIAGNOSIS — I10 Essential (primary) hypertension: Secondary | ICD-10-CM

## 2021-09-16 MED ORDER — AMLODIPINE BESYLATE 5 MG PO TABS: ORAL_TABLET | ORAL | 1 refills | Status: DC

## 2021-09-16 MED ORDER — ATORVASTATIN CALCIUM 20 MG PO TABS: 20.0000 mg | ORAL_TABLET | Freq: Every day | ORAL | 1 refills | Status: DC

## 2021-09-16 NOTE — Progress Notes (Signed)
Wants to discuss bilateral knee replacement ? ?

## 2021-09-16 NOTE — Progress Notes (Signed)
? ?Subjective:  ?Patient ID: Robert Rhodes, male    DOB: 12-22-1945  Age: 76 y.o. MRN: [id number] ? ?CC: Hypertension ? ? ?HPI ?Robert Rhodes is a 76 y.o. year old male with a history of hypertension, schizophrenia, ulcerative colitis, osteoarthritis of the knees here for chronic disease management. ? ?Interval History: ?His Uc is managed by Dr GI- Dr Hilarie Fredrickson and he states he no longer needs his Mesalamine. ?He is managed by [facility] and underwent counseling for his Schizoaffective disorder which he states he has completed.. Meditation has helped him and his Schizoaffective disorder has been stable. ? ?Complains of bilateral knee pain s/p Cortisone injection 2 years ago but now it has worn off and pain has increased. He would like to be evaluated for knee replacement. Pain is 6-8/10 on some days depending on his activity. ? ?He has been adherent with Amlodipine for his Hypertension but has not been taking Atorvastatin ? ?Past Surgical History:  ?Procedure Laterality Date  ? COLON RESECTION N/A 08/23/2013  ? Procedure: Laparoscopic total abdominal colectomy and hernia repair;  Surgeon: Leighton Ruff, MD;  [facility];  Service: General;  Laterality: N/A;  ? COLON SURGERY  08/2013  ? COLONOSCOPY  11/02/2020  ? EUS N/A 07/18/2013  ? Procedure: UPPER ENDOSCOPIC ULTRASOUND (EUS) RADIAL;  Surgeon: Milus Banister, MD;  [facility];  Service: Endoscopy;  Laterality: N/A;  ? EYE SURGERY    ? [place]  ? prep good  ? HERNIA REPAIR  2005  ? In [place] in [place]  ? INCISIONAL HERNIA REPAIR N/A 02/20/2014  ? Procedure: LAP ASSISTED INCISIONAL HERNIA REPAIR LYSIS OF ADHESIONS;  Surgeon: Leighton Ruff, MD;  [facility];  Service: General;  Laterality: N/A;  converted to open @ 0935  ? INCISIONAL HERNIA REPAIR N/A 02/20/2014  ? Procedure: HERNIA REPAIR INCISIONAL;  Surgeon: Leighton Ruff, MD;  [facility];  Service: General;  Laterality: N/A;  With MESH  ? INTRAOCULAR LENS  INSERTION Bilateral   ? 6 yrs ago  ? VENTRAL HERNIA REPAIR    ? ? ?Family History  ?Problem Relation Age of Onset  ? Cancer Mother   ?     type unknown-in her leg  ? Diabetes Father   ? Heart disease Father   ? Heart disease Sister   ? Heart attack Brother   ? Healthy Sister   ? Colon cancer Neg Hx   ? Esophageal cancer Neg Hx   ? Rectal cancer Neg Hx   ? Stomach cancer Neg Hx   ? Colon polyps Neg Hx   ? ? ?Social History  ? ?Socioeconomic History  ? Marital status: Single  ?  Spouse name: Not on file  ? Number of children: 0  ? Years of education: Not on file  ? Highest education level: Not on file  ?Occupational History  ? Occupation: retired  ?Tobacco Use  ? Smoking status: Former  ?  Packs/day: 0.00  ?  Types: Cigarettes  ?  Quit date: 06/14/2003  ?  Years since quitting: 18.2  ? Smokeless tobacco: Never  ? Tobacco comments:  ?  quit in July 2005  ?Vaping Use  ? Vaping Use: Never used  ?Substance and Sexual Activity  ? Alcohol use: No  ? Drug use: Not Currently  ?  Types: Marijuana  ?  Comment: 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/ stopped 2017  ? Sexual activity: Yes  ?Other Topics Concern  ? Not on file  ?  Social History Narrative  ? Not on file  ? ?Social Determinants of Health  ? ?Financial Resource Strain: Not on file  ?Food Insecurity: Not on file  ?Transportation Needs: Not on file  ?Physical Activity: Not on file  ?Stress: Not on file  ?Social Connections: Not on file  ? ? ?Allergies  ?Allergen Reactions  ? Ace Inhibitors Other (See Comments)  ?  Unknown reaction  ? Advil [Ibuprofen] Other (See Comments)  ?  Muscle tightness  ? Aspirin Nausea And Vomiting  ? Tylenol [Acetaminophen] Other (See Comments)  ?  Muscle tightness  ? ? ?Outpatient Medications Prior to Visit  ?Medication Sig Dispense Refill  ? amLODipine (NORVASC) 5 MG tablet TAKE 1 TABLET BY MOUTH ONCE DAILY- HYPERTENSION 60 tablet 0  ? mesalamine (CANASA) 1000 MG suppository Place 1 suppository (1,000 mg total) rectally at bedtime. (Patient not taking: Reported on  09/16/2021) 90 suppository 3  ? Misc. Devices MISC Blood pressure machine.  Diagnosis-hypertension (Patient not taking: Reported on 09/16/2021) 1 each 0  ? prednisoLONE acetate (PRED FORTE) 1 % ophthalmic suspension Place 1 drop into the right eye 4 (four) times daily. (Patient not taking: Reported on 11/12/2020)    ? atorvastatin (LIPITOR) 20 MG tablet Take 1 tablet (20 mg total) by mouth daily. (Patient not taking: Reported on 09/16/2021) 30 tablet 3  ? Multiple Vitamin (MULTIVITAMIN WITH MINERALS) TABS tablet Take 1 tablet by mouth daily at 12 noon. (Patient not taking: Reported on 09/16/2021)    ? Naproxen Sodium 220 MG CAPS Take by mouth as needed.  (Patient not taking: Reported on 11/12/2020)    ? Omega 3 1200 MG CAPS Take 1,200 mg by mouth daily. (Patient not taking: Reported on 09/16/2021)    ? ?No facility-administered medications prior to visit.  ? ? ? ?ROS ?Review of Systems  ?Constitutional:  Negative for activity change and appetite change.  ?HENT:  Negative for sinus pressure and sore throat.   ?Eyes:  Negative for visual disturbance.  ?Respiratory:  Negative for cough, chest tightness and shortness of breath.   ?Cardiovascular:  Negative for chest pain and leg swelling.  ?Gastrointestinal:  Negative for abdominal distention, abdominal pain, constipation and diarrhea.  ?Endocrine: Negative.   ?Genitourinary:  Negative for dysuria.  ?Musculoskeletal:   ?     See HPI  ?Skin:  Negative for rash.  ?Allergic/Immunologic: Negative.   ?Neurological:  Negative for weakness, light-headedness and numbness.  ?Psychiatric/Behavioral:  Negative for dysphoric mood and suicidal ideas.   ? ?Objective:  ?BP 131/73   Pulse 88   Ht 5' 9"  (1.753 m)   Wt 215 lb 9.6 oz (97.8 kg)   SpO2 99%   BMI 31.84 kg/m?  ? ? ?  09/16/2021  ?  9:19 AM 11/12/2020  ? 10:17 AM 11/02/2020  ? 12:20 PM  ?BP/Weight  ?Systolic BP 008 676 195  ?Diastolic BP 73 74 67  ?Wt. (Lbs) 215.6 216.2   ?BMI 31.84 kg/m2 31.93 kg/m2   ? ? ? ? ?Physical  Exam ?Constitutional:   ?   Appearance: He is well-developed.  ?Cardiovascular:  ?   Rate and Rhythm: Normal rate.  ?   Heart sounds: Normal heart sounds. No murmur heard. ?Pulmonary:  ?   Effort: Pulmonary effort is normal.  ?   Breath sounds: Normal breath sounds. No wheezing or rales.  ?Chest:  ?   Chest wall: No tenderness.  ?Abdominal:  ?   General: Bowel sounds are normal. There is no distension.  ?  Palpations: Abdomen is soft. There is no mass.  ?   Tenderness: There is no abdominal tenderness.  ?Musculoskeletal:  ?   Right lower leg: No edema.  ?   Left lower leg: No edema.  ?   Comments: No edema of the knee noted ?Crepitus in bilateral knees on range of motion. ?Associated tenderness in left knee with flexion and extension  ?Neurological:  ?   Mental Status: He is alert and oriented to person, place, and time.  ?Psychiatric:     ?   Mood and Affect: Mood normal.  ? ? ? ?  Latest Ref Rng & Units 11/17/2020  ?  9:53 AM 10/22/2019  ? 10:43 AM 06/03/2019  ? 10:55 AM  ?CMP  ?Glucose 65 - 99 mg/dL 84   83   113    ?BUN 8 - 27 mg/dL 4   11   7     ?Creatinine 0.76 - 1.27 mg/dL 1.07   1.06   1.01    ?Sodium 134 - 144 mmol/L 138   139   138    ?Potassium 3.5 - 5.2 mmol/L 4.3   4.6   3.9    ?Chloride 96 - 106 mmol/L 102   103   98    ?CO2 20 - 29 mmol/L 24   24   22     ?Calcium 8.6 - 10.2 mg/dL 9.5   10.0   9.5    ?Total Protein 6.0 - 8.5 g/dL 8.0   7.7     ?Total Bilirubin 0.0 - 1.2 mg/dL 0.3   0.4     ?Alkaline Phos 44 - 121 IU/L 83   101     ?AST 0 - 40 IU/L 23   28     ?ALT 0 - 44 IU/L 18   28     ? ? ?Lipid Panel  ?   ?Component Value Date/Time  ? CHOL 183 11/17/2020 0953  ? TRIG 106 11/17/2020 0953  ? HDL 40 11/17/2020 0953  ? CHOLHDL 4.6 11/17/2020 0953  ? CHOLHDL 4.2 03/05/2013 1021  ? VLDL 28 03/05/2013 1021  ? [place] 124 (H) 11/17/2020 0953  ? ? ?CBC ?   ?Component Value Date/Time  ? WBC 7.4 10/22/2019 1043  ? WBC 7.5 05/28/2019 1159  ? RBC 4.82 10/22/2019 1043  ? RBC 4.71 05/28/2019 1159  ? HGB 13.6  10/22/2019 1043  ? HCT 41.4 10/22/2019 1043  ? PLT 429 10/22/2019 1043  ? MCV 86 10/22/2019 1043  ? MCH 28.2 10/22/2019 1043  ? MCH 27.1 04/25/2019 0020  ? MCHC 32.9 10/22/2019 1043  ? MCHC 33.2 05/28/2019 1159  ? RDW 14.4 10/22/2019 1043  ? L

## 2021-09-16 NOTE — Patient Instructions (Signed)
Osteoarthritis Osteoarthritis is a type of arthritis. It refers to joint pain or joint disease. Osteoarthritis affects tissue that covers the ends of bones in joints (cartilage). Cartilage acts as a cushion between the bones and helps them move smoothly. Osteoarthritis occurs when cartilage in the joints gets worn down. Osteoarthritis is sometimes called "wear and tear" arthritis. Osteoarthritis is the most common form of arthritis. It often occurs in older people. It is a condition that gets worse over time. The joints most often affected by this condition are in the fingers, toes, hips, knees, and spine, including the neck and lower back. What are the causes? This condition is caused by the wearing down of cartilage that covers the ends of bones. What increases the risk? The following factors may make you more likely to develop this condition: Being age 50 or older. Obesity. Overuse of joints. Past injury of a joint. Past surgery on a joint. Family history of osteoarthritis. What are the signs or symptoms? The main symptoms of this condition are pain, swelling, and stiffness in the joint. Other symptoms may include: An enlarged joint. More pain and further damage caused by small pieces of bone or cartilage that break off and float inside of the joint. Small deposits of bone (osteophytes) that grow on the edges of the joint. A grating or scraping feeling inside the joint when you move it. Popping or creaking sounds when you move. Difficulty walking or exercising. An inability to grip items, twist your hand(s), or control the movements of your hands and fingers. How is this diagnosed? This condition may be diagnosed based on: Your medical history. A physical exam. Your symptoms. X-rays of the affected joint(s). Blood tests to rule out other types of arthritis. How is this treated? There is no cure for this condition, but treatment can help control pain and improve joint function.  Treatment may include a combination of therapies, such as: Pain relief techniques, such as: Applying heat and cold to the joint. Massage. A form of talk therapy called cognitive behavioral therapy (CBT). This therapy helps you set goals and follow up on the changes that you make. Medicines for pain and inflammation. The medicines can be taken by mouth or applied to the skin. They include: NSAIDs, such as ibuprofen. Prescription medicines. Strong anti-inflammatory medicines (corticosteroids). Certain nutritional supplements. A prescribed exercise program. You may work with a physical therapist. Assistive devices, such as a brace, wrap, splint, specialized glove, or cane. A weight control plan. Surgery, such as: An osteotomy. This is done to reposition the bones and relieve pain or to remove loose pieces of bone and cartilage. Joint replacement surgery. You may need this surgery if you have advanced osteoarthritis. Follow these instructions at home: Activity Rest your affected joints as told by your health care provider. Exercise as told by your health care provider. He or she may recommend specific types of exercise, such as: Strengthening exercises. These are done to strengthen the muscles that support joints affected by arthritis. Aerobic activities. These are exercises, such as brisk walking or water aerobics, that increase your heart rate. Range-of-motion activities. These help your joints move more easily. Balance and agility exercises. Managing pain, stiffness, and swelling   If directed, apply heat to the affected area as often as told by your health care provider. Use the heat source that your health care provider recommends, such as a moist heat pack or a heating pad. If you have a removable assistive device, remove it as told by   your health care provider. Place a towel between your skin and the heat source. If your health care provider tells you to keep the assistive device on  while you apply heat, place a towel between the assistive device and the heat source. Leave the heat on for 20-30 minutes. Remove the heat if your skin turns bright red. This is especially important if you are unable to feel pain, heat, or cold. You may have a greater risk of getting burned. If directed, put ice on the affected area. To do this: If you have a removable assistive device, remove it as told by your health care provider. Put ice in a plastic bag. Place a towel between your skin and the bag. If your health care provider tells you to keep the assistive device on during icing, place a towel between the assistive device and the bag. Leave the ice on for 20 minutes, 2-3 times a day. Move your fingers or toes often to reduce stiffness and swelling. Raise (elevate) the injured area above the level of your heart while you are sitting or lying down. General instructions Take over-the-counter and prescription medicines only as told by your health care provider. Maintain a healthy weight. Follow instructions from your health care provider for weight control. Do not use any products that contain nicotine or tobacco, such as cigarettes, e-cigarettes, and chewing tobacco. If you need help quitting, ask your health care provider. Use assistive devices as told by your health care provider. Keep all follow-up visits as told by your health care provider. This is important. Where to find more information National Institute of Arthritis and Musculoskeletal and Skin Diseases: www.niams.nih.gov National Institute on Aging: www.nia.nih.gov American College of Rheumatology: www.rheumatology.org Contact a health care provider if: You have redness, swelling, or a feeling of warmth in a joint that gets worse. You have a fever along with joint or muscle aches. You develop a rash. You have trouble doing your normal activities. Get help right away if: You have pain that gets worse and is not relieved by  pain medicine. Summary Osteoarthritis is a type of arthritis that affects tissue covering the ends of bones in joints (cartilage). This condition is caused by the wearing down of cartilage that covers the ends of bones. The main symptom of this condition is pain, swelling, and stiffness in the joint. There is no cure for this condition, but treatment can help control pain and improve joint function. This information is not intended to replace advice given to you by your health care provider. Make sure you discuss any questions you have with your health care provider. Document Revised: 05/27/2019 Document Reviewed: 05/27/2019 Elsevier Patient Education  2022 Elsevier Inc.  

## 2021-09-17 LAB — CMP14+EGFR
ALT: 21 IU/L (ref 0–44)
AST: 27 IU/L (ref 0–40)
Albumin/Globulin Ratio: 1.1 — ABNORMAL LOW (ref 1.2–2.2)
Albumin: 4 g/dL (ref 3.7–4.7)
Alkaline Phosphatase: 84 IU/L (ref 44–121)
BUN/Creatinine Ratio: 6 — ABNORMAL LOW (ref 10–24)
BUN: 6 mg/dL — ABNORMAL LOW (ref 8–27)
Bilirubin Total: 0.3 mg/dL (ref 0.0–1.2)
CO2: 26 mmol/L (ref 20–29)
Calcium: 9.4 mg/dL (ref 8.6–10.2)
Chloride: 100 mmol/L (ref 96–106)
Creatinine, Ser: 1.06 mg/dL (ref 0.76–1.27)
Globulin, Total: 3.8 g/dL (ref 1.5–4.5)
Glucose: 90 mg/dL (ref 70–99)
Potassium: 3.7 mmol/L (ref 3.5–5.2)
Sodium: 139 mmol/L (ref 134–144)
Total Protein: 7.8 g/dL (ref 6.0–8.5)
eGFR: 72 mL/min/{1.73_m2} (ref >59)

## 2021-09-29 ENCOUNTER — Ambulatory Visit (INDEPENDENT_AMBULATORY_CARE_PROVIDER_SITE_OTHER): Payer: Medicare HMO

## 2021-09-29 ENCOUNTER — Encounter: Payer: Self-pay | Admitting: Orthopaedic Surgery

## 2021-09-29 ENCOUNTER — Ambulatory Visit: Payer: Self-pay

## 2021-09-29 ENCOUNTER — Ambulatory Visit (INDEPENDENT_AMBULATORY_CARE_PROVIDER_SITE_OTHER): Payer: Medicare HMO | Admitting: Orthopaedic Surgery

## 2021-09-29 VITALS — Ht 66.0 in | Wt 215.0 lb

## 2021-09-29 DIAGNOSIS — M1711 Unilateral primary osteoarthritis, right knee: Secondary | ICD-10-CM | POA: Diagnosis not present

## 2021-09-29 DIAGNOSIS — M1712 Unilateral primary osteoarthritis, left knee: Secondary | ICD-10-CM

## 2021-09-29 DIAGNOSIS — M170 Bilateral primary osteoarthritis of knee: Secondary | ICD-10-CM | POA: Diagnosis not present

## 2021-09-29 NOTE — Progress Notes (Signed)
? ?Office Visit Note ?  ?Patient: Robert Rhodes           ?Date of Birth: December 16, 1945           ?MRN: [id number] ?Visit Date: 09/29/2021 ?             ?Requested by: [name], MD ?[address] ?[address] ?[place],  [place] [id number] ?PCP: [name], MD ? ? ?Assessment & Plan: ?Visit Diagnoses:  ?1. Bilateral primary osteoarthritis of knee   ? ? ?Plan: Impression is end-stage severe advanced tricompartmental DJD of both knees.  He has been managing the pain since at least 2019 and has undergone several injections that no longer provide any relief.  He is having significant difficulty in ADLs.  His right knee causes more pain therefore he would like to have this replaced first.  Details of the surgery including risk benefits rehab recovery prognosis reviewed with the patient in detail.  Denies history of nickel allergy or DVT.  Lab work obtained today.  Preoperative clearance will be obtained prior to scheduling surgery. ? ?Total face to face encounter time was greater than 25 minutes and over half of this time was spent in counseling and/or coordination of care. ? ?Follow-Up Instructions: Return if symptoms worsen or fail to improve.  ? ?Orders:  ?Orders Placed This Encounter  ?Procedures  ? XR KNEE 3 VIEW LEFT  ? XR KNEE 3 VIEW RIGHT  ? Prealbumin  ? EXTRA LAV TOP TUBE  ? ?No orders of the defined types were placed in this encounter. ? ? ? ? Procedures: ?No procedures performed ? ? ?Clinical Data: ?No additional findings. ? ? ?Subjective: ?Chief Complaint  ?Patient presents with  ? Right Knee - Pain  ? Left Knee - Pain  ? ? ?HPI patient is a pleasant 76 year old gentleman who comes in today with bilateral knee pain both equally as bad.  This is been ongoing for the past several years and has progressively worsened.  He has been seen in our office in the past for cortisone as well as viscosupplementation injections.  His last viscosupplementation injections were in 2019 which provided pretty good  relief until recently.  The symptoms are throughout the entire aspect of both knees and are worse with working as well as increased activity.  He takes Aleve without significant relief. ? ?Review of Systems as detailed in HPI.  All others reviewed and are negative.   ? ? ?Objective: ?Vital Signs: Ht 5' 6"  (1.676 m)   Wt 215 lb (97.5 kg)   BMI 34.70 kg/m?  ? ?Physical Exam well-developed well-nourished gentleman in no acute distress.  Alert and oriented x3. ? ?Ortho Exam bilateral knee exam shows trace effusion.  Range of motion 0 to 110 degrees.  Mild medial joint line tenderness.  Ligaments are stable.  He is neurovascular intact distally. ? ?Specialty Comments:  ?No specialty comments available. ? ?Imaging: ?XR KNEE 3 VIEW LEFT ? ?Result Date: 09/30/2021 ?Advanced tricompartmental degenerative joint disease.  Bone-on-bone joint space narrowing. ? ?XR KNEE 3 VIEW RIGHT ? ?Result Date: 09/30/2021 ?Advanced tricompartmental degenerative joint disease.  Bone-on-bone joint space narrowing.  ? ? ?PMFS History: ?Patient Active Problem List  ? Diagnosis Date Noted  ? AKI (acute kidney injury) ([place]) 04/21/2019  ? Pneumonia due to COVID-19 virus 04/21/2019  ? Osteoporosis   ? Ulcerative colitis, with rectal bleeding 08/06/2015  ? Gastroesophageal reflux disease without esophagitis 08/06/2015  ? Visual impairment of right eye 06/18/2015  ?  Essential hypertension, benign 06/18/2015  ? Hernia of abdominal cavity 11/27/2014  ? Bipolar 1 disorder, mixed, moderate ([place]) 11/27/2014  ? Cellulitis 08/23/2014  ? Medically noncompliant 08/23/2014  ? Left leg cellulitis 08/23/2014  ? Cellulitis of left leg   ? Hypokalemia   ? Diarrhea 05/06/2014  ? Ulcerative colitis ([place]) 04/17/2014  ? Verrucous keratosis 04/17/2014  ? Nonspecific (abnormal) findings on radiological and other examination of gastrointestinal tract 07/18/2013  ? Acute esophagitis 07/18/2013  ? Hypovitaminosis D 05/06/2013  ? Ulcerative colitis, unspecified 04/04/2013  ?  Knee pain, bilateral 03/05/2013  ? ?Past Medical History:  ?Diagnosis Date  ? Adenomatous colon polyp   ? Arthritis   ? bilateral knees-needs replacements  ? Claustrophobia   ? Colon cancer ([place]) 2015  ? COVID-19   ? Eye abnormalities   ? right eye seen in ED 02/17/2014 wearing eye patch using eye drops  ? GERD (gastroesophageal reflux disease)   ? with certain foods  ? Glaucoma   ? not on eye drops at this time (10/21/2020)  ? Hypertension   ? on meds  ? Irregular heartbeat   ? per pt/doesn't know what it does  ? Osteoarthritis   ? bilat knees  needs replacement  ? Osteoporosis   ? pt denies  ? Substance abuse ([place])   ? stopped over 3 years ago  ? UC (ulcerative colitis) ([place])   ? Vitamin D deficiency   ?  ?Family History  ?Problem Relation Age of Onset  ? Cancer Mother   ?     type unknown-in her leg  ? Diabetes Father   ? Heart disease Father   ? Heart disease Sister   ? Heart attack Brother   ? Healthy Sister   ? Colon cancer Neg Hx   ? Esophageal cancer Neg Hx   ? Rectal cancer Neg Hx   ? Stomach cancer Neg Hx   ? Colon polyps Neg Hx   ?  ?Past Surgical History:  ?Procedure Laterality Date  ? COLON RESECTION N/A 08/23/2013  ? Procedure: Laparoscopic total abdominal colectomy and hernia repair;  Surgeon: Leighton Ruff, MD;  [facility];  Service: General;  Laterality: N/A;  ? COLON SURGERY  08/2013  ? COLONOSCOPY  11/02/2020  ? EUS N/A 07/18/2013  ? Procedure: UPPER ENDOSCOPIC ULTRASOUND (EUS) RADIAL;  Surgeon: Milus Banister, MD;  [facility];  Service: Endoscopy;  Laterality: N/A;  ? EYE SURGERY    ? [place]  ? prep good  ? HERNIA REPAIR  2005  ? In [place] in [place]  ? INCISIONAL HERNIA REPAIR N/A 02/20/2014  ? Procedure: LAP ASSISTED INCISIONAL HERNIA REPAIR LYSIS OF ADHESIONS;  Surgeon: Leighton Ruff, MD;  [facility];  Service: General;  Laterality: N/A;  converted to open @ 0935  ? INCISIONAL HERNIA REPAIR N/A 02/20/2014  ? Procedure: HERNIA REPAIR INCISIONAL;   Surgeon: Leighton Ruff, MD;  [facility];  Service: General;  Laterality: N/A;  With MESH  ? INTRAOCULAR LENS INSERTION Bilateral   ? 6 yrs ago  ? VENTRAL HERNIA REPAIR    ? ?Social History  ? ?Occupational History  ? Occupation: retired  ?Tobacco Use  ? Smoking status: Former  ?  Packs/day: 0.00  ?  Types: Cigarettes  ?  Quit date: 06/14/2003  ?  Years since quitting: 18.3  ? Smokeless tobacco: Never  ? Tobacco comments:  ?  quit in July 2005  ?Vaping Use  ? Vaping  Use: Never used  ?Substance and Sexual Activity  ? Alcohol use: No  ? Drug use: Not Currently  ?  Types: Marijuana  ?  Comment: 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/ stopped 2017  ? Sexual activity: Yes  ? ? ? ? ? ? ?

## 2021-09-30 ENCOUNTER — Telehealth: Payer: Self-pay | Admitting: Orthopaedic Surgery

## 2021-09-30 ENCOUNTER — Ambulatory Visit: Payer: Self-pay | Admitting: *Deleted

## 2021-09-30 LAB — EXTRA LAV TOP TUBE

## 2021-09-30 LAB — PREALBUMIN: Prealbumin: 18 mg/dL — ABNORMAL LOW (ref 21–43)

## 2021-09-30 NOTE — Progress Notes (Signed)
Prealbumin low.  He needs to see his PCP about improving nutrition prior to surgery

## 2021-09-30 NOTE — Telephone Encounter (Signed)
I called and left VM. ? ?See Xu's message. ? ?"Prealbumin low.  He needs to see his PCP about improving nutrition prior to surgery " ?

## 2021-09-30 NOTE — Telephone Encounter (Signed)
?  Chief Complaint: Needs nutritional information, guidance. ?Symptoms: None ?Frequency:  ?Pertinent Negatives: Patient denies  ?Disposition: [] ED /[] Urgent Care (no appt availability in office) / [] Appointment(In office/virtual)/ []  [facility]/ [] Home Care/ [] Refused Recommended Disposition /[] [facility]/ [x]  Follow-up with PCP ?Additional Notes: Pt states heard from surgeon who is to preform his surgery. States "They said to contact my doctor because they can't do my surgery until my nutrition is better." Pt did have labs today, abnormal albumin.  Please advise. ?Reason for Disposition ? [1] Caller requesting NON-URGENT health information AND [2] PCP's office is the best resource ? ?Answer Assessment - Initial Assessment Questions ?1. REASON FOR CALL or QUESTION: "What is your reason for calling today?" or "How can I best help you?" or "What question do you have that I can help answer?" ?    "Need better nutrition before my surgery ? ?Protocols used: Information Only Call - No Triage-A-AH ? ?

## 2021-09-30 NOTE — Progress Notes (Signed)
Called patient no answer. LMOM with details below. ? ?

## 2021-09-30 NOTE — Telephone Encounter (Signed)
Pt is wondering what he needs to do to go ahead and get started on scheduling surgery.  ?

## 2021-09-30 NOTE — Telephone Encounter (Signed)
Spoke to patient

## 2021-10-01 ENCOUNTER — Encounter: Payer: Self-pay | Admitting: Family Medicine

## 2021-10-01 NOTE — Telephone Encounter (Signed)
Pt has an appointment to discuss concerns. ?

## 2021-10-07 ENCOUNTER — Encounter: Payer: Self-pay | Admitting: Family Medicine

## 2021-10-08 ENCOUNTER — Other Ambulatory Visit: Payer: Self-pay

## 2021-10-08 ENCOUNTER — Other Ambulatory Visit: Payer: Self-pay | Admitting: Family Medicine

## 2021-10-08 ENCOUNTER — Encounter: Payer: Self-pay | Admitting: Family Medicine

## 2021-10-08 DIAGNOSIS — Z01810 Encounter for preprocedural cardiovascular examination: Secondary | ICD-10-CM

## 2021-10-10 NOTE — Progress Notes (Signed)
?Cardiology Office Note:   ? ?Date:  10/11/2021  ? ?ID:  Robert Rhodes, DOB 11-09-1945, MRN [id number] ? ?PCP:  [name], MD  ?Cardiologist:  None  ?Electrophysiologist:  None  ? ?Referring MD: [name], MD  ? ?Chief Complaint  ?Patient presents with  ? Pre-op Exam  ? ? ?History of Present Illness:   ? ?Robert Rhodes is a 76 y.o. male with a hx of colon cancer, GERD, hypertension, former substance abuse, ulcerative colitis who is referred by Dr. Margarita Rana for preoperative evaluation.  Planning knee surgery.  He denies any chest pain or dyspnea.  Reports he walks 25 minutes/day, denies any exertional symptoms.  States that he can walk up a flight of stairs without stopping, is limited by knee pain sometimes but denies any exertional chest pain or dyspnea.  He denies any lightheadedness, syncope, lower extremity edema, or palpitations.  He smoked off and on for 40 years, quit in 2005.  Quit smoking marijuana in 2016.  Family history includes sister has pacemaker ? ? ?Past Medical History:  ?Diagnosis Date  ? Adenomatous colon polyp   ? Arthritis   ? bilateral knees-needs replacements  ? Claustrophobia   ? Colon cancer ([place]) 2015  ? COVID-19   ? Eye abnormalities   ? right eye seen in ED 02/17/2014 wearing eye patch using eye drops  ? GERD (gastroesophageal reflux disease)   ? with certain foods  ? Glaucoma   ? not on eye drops at this time (10/21/2020)  ? Hypertension   ? on meds  ? Irregular heartbeat   ? per pt/doesn't know what it does  ? Osteoarthritis   ? bilat knees  needs replacement  ? Osteoporosis   ? pt denies  ? Substance abuse ([place])   ? stopped over 3 years ago  ? UC (ulcerative colitis) ([place])   ? Vitamin D deficiency   ? ? ?Past Surgical History:  ?Procedure Laterality Date  ? COLON RESECTION N/A 08/23/2013  ? Procedure: Laparoscopic total abdominal colectomy and hernia repair;  Surgeon: Leighton Ruff, MD;  Location: [facility];  Service: General;  Laterality: N/A;  ? COLON SURGERY  08/2013  ?  COLONOSCOPY  11/02/2020  ? EUS N/A 07/18/2013  ? Procedure: UPPER ENDOSCOPIC ULTRASOUND (EUS) RADIAL;  Surgeon: Milus Banister, MD;  Location: [facility];  Service: Endoscopy;  Laterality: N/A;  ? EYE SURGERY    ? [place]  ? prep good  ? HERNIA REPAIR  2005  ? In [place]/put in a mesh  ? INCISIONAL HERNIA REPAIR N/A 02/20/2014  ? Procedure: LAP ASSISTED INCISIONAL HERNIA REPAIR LYSIS OF ADHESIONS;  Surgeon: Leighton Ruff, MD;  Location: [facility];  Service: General;  Laterality: N/A;  converted to open @ 0935  ? INCISIONAL HERNIA REPAIR N/A 02/20/2014  ? Procedure: HERNIA REPAIR INCISIONAL;  Surgeon: Leighton Ruff, MD;  Location: [facility];  Service: General;  Laterality: N/A;  With MESH  ? INTRAOCULAR LENS INSERTION Bilateral   ? 6 yrs ago  ? VENTRAL HERNIA REPAIR    ? ? ?Current Medications: ?Current Meds  ?Medication Sig  ? amLODipine (NORVASC) 5 MG tablet TAKE 1 TABLET BY MOUTH ONCE DAILY- HYPERTENSION  ? atorvastatin (LIPITOR) 20 MG tablet Take 1 tablet (20 mg total) by mouth daily.  ? MAGNESIUM PO Take by mouth daily.  ? Potassium 99 MG TABS Take by mouth daily.  ?  ? ?Allergies:   Ace inhibitors, Advil [ibuprofen], Aspirin, and Tylenol [acetaminophen]  ? ?  Social History  ? ?Socioeconomic History  ? Marital status: Single  ?  Spouse name: Not on file  ? Number of children: 0  ? Years of education: Not on file  ? Highest education level: Not on file  ?Occupational History  ? Occupation: retired  ?Tobacco Use  ? Smoking status: Former  ?  Packs/day: 0.00  ?  Types: Cigarettes  ?  Quit date: 06/14/2003  ?  Years since quitting: 18.3  ? Smokeless tobacco: Never  ? Tobacco comments:  ?  quit in July 2005  ?Vaping Use  ? Vaping Use: Never used  ?Substance and Sexual Activity  ? Alcohol use: No  ? Drug use: Not Currently  ?  Types: Marijuana  ?  Comment: 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/ stopped 2017  ? Sexual activity: Yes  ?Other Topics Concern  ? Not on file  ?Social History Narrative  ? Not on file  ? ?Social Determinants  of Health  ? ?Financial Resource Strain: Not on file  ?Food Insecurity: Not on file  ?Transportation Needs: Not on file  ?Physical Activity: Not on file  ?Stress: Not on file  ?Social Connections: Not on file  ?  ? ?Family History: ?The patient's family history includes Cancer in his mother; Diabetes in his father; Healthy in his sister; Heart attack in his brother; Heart disease in his father and sister. There is no history of Colon cancer, Esophageal cancer, Rectal cancer, Stomach cancer, or Colon polyps. ? ?ROS:   ?Please see the history of present illness.    ? All other systems reviewed and are negative. ? ?EKGs/Labs/Other Studies Reviewed:   ? ?The following studies were reviewed today: ? ? ?EKG:   ?10/11/2021: Sinus tachycardia, rate 108, nonspecific T wave flattening ? ?Recent Labs: ?09/16/2021: ALT 21; BUN 6; Creatinine, Ser 1.06; Potassium 3.7; Sodium 139  ?Recent Lipid Panel ?   ?Component Value Date/Time  ? CHOL 183 11/17/2020 0953  ? TRIG 106 11/17/2020 0953  ? HDL 40 11/17/2020 0953  ? CHOLHDL 4.6 11/17/2020 0953  ? CHOLHDL 4.2 03/05/2013 1021  ? VLDL 28 03/05/2013 1021  ? [place] 124 (H) 11/17/2020 6546  ? ? ?Physical Exam:   ? ?VS:  BP 124/72 (BP Location: Right Arm, Patient Position: Sitting, Cuff Size: Normal)   Pulse (!) 108   Ht 5' 9"  (1.753 m)   Wt 217 lb (98.4 kg)   SpO2 98%   BMI 32.05 kg/m?    ? ?Wt Readings from Last 3 Encounters:  ?10/11/21 217 lb (98.4 kg)  ?09/29/21 215 lb (97.5 kg)  ?09/16/21 215 lb 9.6 oz (97.8 kg)  ?  ? ?GEN:  Well nourished, well developed in no acute distress ?HEENT: Normal ?NECK: No JVD; No carotid bruits ?LYMPHATICS: No lymphadenopathy ?CARDIAC: RRR, no murmurs, rubs, gallops ?RESPIRATORY:  Clear to auscultation without rales, wheezing or rhonchi  ?ABDOMEN: Soft, non-tender, non-distended ?MUSCULOSKELETAL:  No edema; No deformity  ?SKIN: Warm and dry ?NEUROLOGIC:  Alert and oriented x 3 ?PSYCHIATRIC:  Normal affect  ? ?ASSESSMENT:   ? ?1. Pre-operative  cardiovascular examination   ?2. Abnormal EKG   ?3. Hyperlipidemia, unspecified hyperlipidemia type   ?4. Essential hypertension   ? ?PLAN:   ? ?Preop evaluation: Prior to knee surgery.  Denies any chest pain or dyspnea.  Functional capacity greater than 4 METS.  RCRI score 0.  Overall would classify as low risk for intermediate risk surgery.  He has subtle EKG abnormalities with nonspecific ST/T wave abnormalities and sinus  tachycardia.  Will check echocardiogram to rule out structural heart disease.  If unremarkable, no further work-up recommended prior to surgery. ? ?Hypertension: On amlodipine 5 mg daily.  Appears controlled ? ?Hyperlipidemia: LDL 124 in 11/17/2020.  Started on atorvastatin 20 mg daily at that time.  Will check lipid panel ? ?RTC in 6 months ? ?Medication Adjustments/Labs and Tests Ordered: ?Current medicines are reviewed at length with the patient today.  Concerns regarding medicines are outlined above.  ?Orders Placed This Encounter  ?Procedures  ? Lipid panel  ? EKG 12-Lead  ? ECHOCARDIOGRAM COMPLETE  ? ?No orders of the defined types were placed in this encounter. ? ? ?Patient Instructions  ?Medication Instructions:  ?Your physician recommends that you continue on your current medications as directed. Please refer to the Current Medication list given to you today. ? ?*If you need a refill on your cardiac medications before your next appointment, please call your pharmacy* ? ? ?Lab Work: ?Lipid today ? ?If you have labs (blood work) drawn today and your tests are completely normal, you will receive your results only by: ?MyChart Message (if you have MyChart) OR ?A paper copy in the mail ?If you have any lab test that is abnormal or we need to change your treatment, we will call you to review the results. ? ? ?Testing/Procedures: ?Your physician has requested that you have an echocardiogram (Prior to 6/1). Echocardiography is a painless test that uses sound waves to create images of your heart.  It provides your doctor with information about the size and shape of your heart and how well your heart?s chambers and valves are working. This procedure takes approximately one hour. There are no restrictions for this

## 2021-10-11 ENCOUNTER — Encounter: Payer: Self-pay | Admitting: Cardiology

## 2021-10-11 ENCOUNTER — Ambulatory Visit (INDEPENDENT_AMBULATORY_CARE_PROVIDER_SITE_OTHER): Payer: Medicare HMO | Admitting: Cardiology

## 2021-10-11 VITALS — BP 124/72 | HR 108 | Ht 69.0 in | Wt 217.0 lb

## 2021-10-11 DIAGNOSIS — I10 Essential (primary) hypertension: Secondary | ICD-10-CM

## 2021-10-11 DIAGNOSIS — Z01810 Encounter for preprocedural cardiovascular examination: Secondary | ICD-10-CM

## 2021-10-11 DIAGNOSIS — E785 Hyperlipidemia, unspecified: Secondary | ICD-10-CM

## 2021-10-11 DIAGNOSIS — R9431 Abnormal electrocardiogram [ECG] [EKG]: Secondary | ICD-10-CM

## 2021-10-11 LAB — LIPID PANEL
Chol/HDL Ratio: 2.9 ratio (ref 0.0–5.0)
Cholesterol, Total: 128 mg/dL (ref 100–199)
HDL: 44 mg/dL (ref >39)
LDL Chol Calc (NIH): 68 mg/dL (ref 0–99)
Triglycerides: 81 mg/dL (ref 0–149)
VLDL Cholesterol Cal: 16 mg/dL (ref 5–40)

## 2021-10-11 NOTE — Patient Instructions (Signed)
Medication Instructions:  ?Your physician recommends that you continue on your current medications as directed. Please refer to the Current Medication list given to you today. ? ?*If you need a refill on your cardiac medications before your next appointment, please call your pharmacy* ? ? ?Lab Work: ?Lipid today ? ?If you have labs (blood work) drawn today and your tests are completely normal, you will receive your results only by: ?MyChart Message (if you have MyChart) OR ?A paper copy in the mail ?If you have any lab test that is abnormal or we need to change your treatment, we will call you to review the results. ? ? ?Testing/Procedures: ?Your physician has requested that you have an echocardiogram (Prior to 6/1). Echocardiography is a painless test that uses sound waves to create images of your heart. It provides your doctor with information about the size and shape of your heart and how well your heart?s chambers and valves are working. This procedure takes approximately one hour. There are no restrictions for this procedure. ? ? ?Follow-Up: ?At [facility], you and your health needs are our priority.  As part of our continuing mission to provide you with exceptional heart care, we have created designated Provider Care Teams.  These Care Teams include your primary Cardiologist (physician) and Advanced Practice Providers (APPs -  Physician Assistants and Nurse Practitioners) who all work together to provide you with the care you need, when you need it. ? ?We recommend signing up for the patient portal called "MyChart".  Sign up information is provided on this After Visit Summary.  MyChart is used to connect with patients for Virtual Visits (Telemedicine).  Patients are able to view lab/test results, encounter notes, upcoming appointments, etc.  Non-urgent messages can be sent to your provider as well.   ?To learn more about what you can do with MyChart, go to [email or web address].   ? ?Your next  appointment:   ?6 month(s) ? ?The format for your next appointment:   ?In Person ? ?Provider:   ?Dr. Gardiner Rhyme ? ? ?Important Information About Sugar ? ? ? ? ? ? ?

## 2021-10-14 ENCOUNTER — Ambulatory Visit (HOSPITAL_COMMUNITY): Payer: Medicare HMO | Attending: Cardiology

## 2021-10-14 DIAGNOSIS — R9431 Abnormal electrocardiogram [ECG] [EKG]: Secondary | ICD-10-CM | POA: Insufficient documentation

## 2021-10-14 DIAGNOSIS — Z01810 Encounter for preprocedural cardiovascular examination: Secondary | ICD-10-CM | POA: Diagnosis not present

## 2021-10-14 LAB — ECHOCARDIOGRAM COMPLETE
Area-P 1/2: 4.1 cm2
S' Lateral: 2.4 cm

## 2021-10-21 ENCOUNTER — Ambulatory Visit: Payer: Medicare HMO | Attending: Physician Assistant | Admitting: Physician Assistant

## 2021-10-21 ENCOUNTER — Encounter: Payer: Self-pay | Admitting: Physician Assistant

## 2021-10-21 VITALS — BP 130/75 | HR 76 | Temp 98.9°F | Resp 18 | Ht 64.0 in | Wt 215.0 lb

## 2021-10-21 DIAGNOSIS — Z713 Dietary counseling and surveillance: Secondary | ICD-10-CM | POA: Diagnosis not present

## 2021-10-21 NOTE — Patient Instructions (Signed)
Get 80-110 grams protein daily.  Drink ample water.  I will try and see if I can get protein drinks by prescription for you ?

## 2021-10-21 NOTE — Progress Notes (Signed)
? ?Robert Rhodes, is a 76 y.o. male ? ?KDT:[id number] ? ?DXI:[id number] ? ?DOB - 06/19/1945 ? ?Chief Complaint  ?Patient presents with  ? Nutrition Counseling  ?    ? ?Subjective:  ? ?Robert Rhodes is a 76 y.o. male here today to discuss proper nutrition.  He has been a long time vegetarian(mostly vegan) and lately has been working on healthier diet to facilitate healing with upcoming surgery.  He eats a lot of broccoli and beans/lentils and also supplements with ensure with protein.  He is also trying to drink adequate water.   ? ? ?No problems updated. ? ?ALLERGIES: ?Allergies  ?Allergen Reactions  ? Ace Inhibitors Other (See Comments)  ?  Unknown reaction  ? Advil [Ibuprofen] Other (See Comments)  ?  Muscle tightness  ? Aspirin Nausea And Vomiting  ? Tylenol [Acetaminophen] Other (See Comments)  ?  Muscle tightness  ? ? ?PAST MEDICAL HISTORY: ?Past Medical History:  ?Diagnosis Date  ? Adenomatous colon polyp   ? Arthritis   ? bilateral knees-needs replacements  ? Claustrophobia   ? Colon cancer ([place]) 2015  ? COVID-19   ? Eye abnormalities   ? right eye seen in ED 02/17/2014 wearing eye patch using eye drops  ? GERD (gastroesophageal reflux disease)   ? with certain foods  ? Glaucoma   ? not on eye drops at this time (10/21/2020)  ? Hypertension   ? on meds  ? Irregular heartbeat   ? per pt/doesn't know what it does  ? Osteoarthritis   ? bilat knees  needs replacement  ? Osteoporosis   ? pt denies  ? Substance abuse ([place])   ? stopped over 3 years ago  ? UC (ulcerative colitis) ([place])   ? Vitamin D deficiency   ? ? ?MEDICATIONS AT HOME: ?Prior to Admission medications   ?Medication Sig Start Date End Date Taking? Authorizing Provider  ?amLODipine (NORVASC) 5 MG tablet TAKE 1 TABLET BY MOUTH ONCE DAILY- HYPERTENSION 09/16/21  Yes [name], MD  ?atorvastatin (LIPITOR) 20 MG tablet Take 1 tablet (20 mg total) by mouth daily. 09/16/21  Yes [name], MD  ?MAGNESIUM PO Take by mouth daily.   Yes [provider]  ?Potassium 99 MG TABS Take by mouth daily.   Yes [provider]  ? ? ?ROS: ?Neg HEENT ?Neg resp ?Neg cardiac ?Neg GI ?Neg GU ?Neg MS ?Neg psych ?Neg neuro ? ?Objective:  ? ?Vitals:  ? 10/21/21 1020  ?BP: 130/75  ?Pulse: 76  ?Resp: 18  ?Temp: 98.9 ?F (37.2 ?C)  ?TempSrc: Oral  ?SpO2: 98%  ?Weight: 215 lb (97.5 kg)  ?Height: 5' 4"  (1.626 m)  ? ?Exam ?General appearance : Awake, alert, not in any distress. Speech Clear. Not toxic looking ?HEENT: Atraumatic and Normocephalic ?Chest: Good air entry bilaterally, CTAB.  No rales/rhonchi/wheezing ?CVS: S1 S2 regular, no murmurs.  ?Extremities: B/L Lower Ext shows no edema, both legs are warm to touch ?Neurology: Awake alert, and oriented X 3, CN II-XII intact, Non focal ?Skin: No Rash ? ?Data Review ?Lab Results  ?Component Value Date  ? HGBA1C 5.5 10/22/2019  ? ? ?Assessment & Plan  ? ?1. Nutritional counseling ?=Get 80-110 grams protein daily.  Drink ample water.  I gave him glucerna 30g protein drinks to try as a supplement(not meal replacement) for protein he can add to diet.  I also taught him how to look up protein amounts for various foods.  Also proper vitamin/mineral and healthy carb intake  reviewed.   ? ? ?Return for keep next appt with Dr Margarita Rana. ? ?The patient was given clear instructions to go to ER or return to medical center if symptoms don't improve, worsen or new problems develop. The patient verbalized understanding. The patient was told to call to get lab results if they haven't heard anything in the next week.  ? ? ? ? ?Freeman Caldron, PA-C ?[facility] ?[place], [place] ?[phone]   ?10/21/2021, 12:45 PM Patient ID: Robert Rhodes, male   DOB: 1945/11/29, 76 y.o.   MRN: [id number] ? ?

## 2021-10-21 NOTE — Progress Notes (Signed)
Patient has eaten and taken medication today ?Patient denies pan at this time, ?Patient reports having junk food spells. Patient reports being a vegetarian. ?Patient presents for advice on avoiding the junk food spells ? ?

## 2021-11-02 NOTE — Progress Notes (Signed)
Surgical Instructions    Your procedure is scheduled on Thursday, June 1st, 2023.   Report to [facility] Main Entrance "A" at 05:30 A.M., then check in with the Admitting office.  Call this number if you have problems the morning of surgery:  [phone]   If you have any questions prior to your surgery date call [phone]: Open Monday-Friday 8am-4pm    Remember:  Do not eat after midnight the night before your surgery  You may drink clear liquids until 04:30 the morning of your surgery.   Clear liquids allowed are: Water, Non-Citrus Juices (without pulp), Carbonated Beverages, Clear Tea, Black Coffee ONLY (NO MILK, CREAM OR POWDERED CREAMER of any kind), and Gatorade  Patient Instructions  The night before surgery:  No food after midnight. ONLY clear liquids after midnight  The day of surgery (if you do NOT have diabetes):  Drink ONE (1) Pre-Surgery Clear Ensure by 04:30 the morning of surgery. Drink in one sitting. Do not sip.  This drink was given to you during your hospital  pre-op appointment visit.  Nothing else to drink after completing the  Pre-Surgery Clear Ensure.          If you have questions, please contact your surgeon's office.     Take these medicines the morning of surgery with A SIP OF WATER:   amLODipine (NORVASC) atorvastatin (LIPITOR) omeprazole (PRILOSEC OTC) - if needed  As of today, STOP taking any Aspirin (unless otherwise instructed by your surgeon) Aleve, Naproxen, Ibuprofen, Motrin, Advil, Goody's, BC's, all herbal medications, fish oil, and all vitamins.    The day of surgery:          Do not wear jewelry  Do not wear lotions, powders, colognes, or deodorant. Men may shave face and neck. Do not bring valuables to the hospital.   [facility] is not responsible for any belongings or valuables. .   Do NOT Smoke (Tobacco/Vaping)  24 hours prior to your procedure  If you use a CPAP at night, you may bring your mask for your overnight  stay.   Contacts, glasses, hearing aids, dentures or partials may not be worn into surgery, please bring cases for these belongings   For patients admitted to the hospital, discharge time will be determined by your treatment team.   Patients discharged the day of surgery will not be allowed to drive home, and someone needs to stay with them for 24 hours.   SURGICAL WAITING ROOM VISITATION Patients having surgery or a procedure in a hospital may have two support people. Children under the age of 64 must have an adult with them who is not the patient. They may stay in the waiting area during the procedure and may switch out with other visitors. If the patient needs to stay at the hospital during part of their recovery, the visitor guidelines for inpatient rooms apply.  Please refer to the [facility] website for the visitor guidelines for Inpatients (after your surgery is over and you are in a regular room).    Special instructions:    Oral Hygiene is also important to reduce your risk of infection.  Remember - BRUSH YOUR TEETH THE MORNING OF SURGERY WITH YOUR REGULAR TOOTHPASTE   [facility] - Preparing For Surgery  Before surgery, you can play an important role. Because skin is not sterile, your skin needs to be as free of germs as possible. You can reduce the number of germs on your skin by washing with CHG (chlorahexidine  gluconate) Soap before surgery.  CHG is an antiseptic cleaner which kills germs and bonds with the skin to continue killing germs even after washing.     Please do not use if you have an allergy to CHG or antibacterial soaps. If your skin becomes reddened/irritated stop using the CHG.  Do not shave (including legs and underarms) for at least 48 hours prior to first CHG shower. It is OK to shave your face.  Please follow these instructions carefully.     Shower the NIGHT BEFORE SURGERY and the MORNING OF SURGERY with CHG Soap.   If you chose to wash your hair, wash  your hair first as usual with your normal shampoo. After you shampoo, rinse your hair and body thoroughly to remove the shampoo.  Then [employer] and genitals (private parts) with your normal soap and rinse thoroughly to remove soap.  After that Use CHG Soap as you would any other liquid soap. You can apply CHG directly to the skin and wash gently with a scrungie or a clean washcloth.   Apply the CHG Soap to your body ONLY FROM THE NECK DOWN.  Do not use on open wounds or open sores. Avoid contact with your eyes, ears, mouth and genitals (private parts). Wash Face and genitals (private parts)  with your normal soap.   Wash thoroughly, paying special attention to the area where your surgery will be performed.  Thoroughly rinse your body with warm water from the neck down.  DO NOT shower/wash with your normal soap after using and rinsing off the CHG Soap.  Pat yourself dry with a CLEAN TOWEL.  Wear CLEAN PAJAMAS to bed the night before surgery  Place CLEAN SHEETS on your bed the night before your surgery  DO NOT SLEEP WITH PETS.   Day of Surgery:  Take a shower with CHG soap. Wear Clean/Comfortable clothing the morning of surgery Do not apply any deodorants/lotions.   Remember to brush your teeth WITH YOUR REGULAR TOOTHPASTE.    If you received a COVID test during your pre-op visit, it is requested that you wear a mask when out in public, stay away from anyone that may not be feeling well, and notify your surgeon if you develop symptoms. If you have been in contact with anyone that has tested positive in the last 10 days, please notify your surgeon.    Please read over the following fact sheets that you were given.

## 2021-11-03 ENCOUNTER — Other Ambulatory Visit: Payer: Self-pay | Admitting: Physician Assistant

## 2021-11-03 ENCOUNTER — Other Ambulatory Visit: Payer: Self-pay

## 2021-11-03 ENCOUNTER — Encounter (HOSPITAL_COMMUNITY): Payer: Self-pay

## 2021-11-03 ENCOUNTER — Encounter (HOSPITAL_COMMUNITY)
Admission: RE | Admit: 2021-11-03 | Discharge: 2021-11-03 | Disposition: A | Discharge status: Home / Self Care | Payer: Medicare HMO | Source: Ambulatory Visit | Attending: Orthopaedic Surgery | Admitting: Orthopaedic Surgery

## 2021-11-03 VITALS — BP 141/69 | HR 80 | Temp 98.5°F | Resp 17 | Ht 69.0 in | Wt 218.1 lb

## 2021-11-03 DIAGNOSIS — Z01812 Encounter for preprocedural laboratory examination: Secondary | ICD-10-CM | POA: Diagnosis not present

## 2021-11-03 DIAGNOSIS — M1711 Unilateral primary osteoarthritis, right knee: Secondary | ICD-10-CM | POA: Diagnosis not present

## 2021-11-03 DIAGNOSIS — Z79899 Other long term (current) drug therapy: Secondary | ICD-10-CM | POA: Insufficient documentation

## 2021-11-03 DIAGNOSIS — Z01818 Encounter for other preprocedural examination: Secondary | ICD-10-CM

## 2021-11-03 LAB — COMPREHENSIVE METABOLIC PANEL
ALT: 43 U/L (ref 0–44)
AST: 36 U/L (ref 15–41)
Albumin: 3.5 g/dL (ref 3.5–5.0)
Alkaline Phosphatase: 77 U/L (ref 38–126)
Anion gap: 6 (ref 5–15)
BUN: 15 mg/dL (ref 8–23)
CO2: 28 mmol/L (ref 22–32)
Calcium: 9.5 mg/dL (ref 8.9–10.3)
Chloride: 103 mmol/L (ref 98–111)
Creatinine, Ser: 1.11 mg/dL (ref 0.61–1.24)
GFR, Estimated: >60 mL/min (ref >60)
Glucose, Bld: 86 mg/dL (ref 70–99)
Potassium: 3.3 mmol/L — ABNORMAL LOW (ref 3.5–5.1)
Sodium: 137 mmol/L (ref 135–145)
Total Bilirubin: 0.3 mg/dL (ref 0.3–1.2)
Total Protein: 8.4 g/dL — ABNORMAL HIGH (ref 6.5–8.1)

## 2021-11-03 LAB — CBC
HCT: 40.7 % (ref 39.0–52.0)
Hemoglobin: 13.3 g/dL (ref 13.0–17.0)
MCH: 27.9 pg (ref 26.0–34.0)
MCHC: 32.7 g/dL (ref 30.0–36.0)
MCV: 85.3 fL (ref 80.0–100.0)
Platelets: 423 10*3/uL — ABNORMAL HIGH (ref 150–400)
RBC: 4.77 MIL/uL (ref 4.22–5.81)
RDW: 14.6 % (ref 11.5–15.5)
WBC: 9 10*3/uL (ref 4.0–10.5)
nRBC: 0 % (ref 0.0–0.2)

## 2021-11-03 LAB — SURGICAL PCR SCREEN
MRSA, PCR: NEGATIVE
Staphylococcus aureus: NEGATIVE

## 2021-11-03 NOTE — Progress Notes (Signed)
Potassium is 3.3.  patient on potassium 99 mg/daily.  Want me to send in kdur?

## 2021-11-03 NOTE — Progress Notes (Signed)
PCP - [name], MD Cardiologist - [name]- cardiac clearance note in epic 10/11/21- patient denies any cardiac symptoms  PPM/ICD - denies Chest x-ray - N/A EKG - 10/11/21 Stress Test - denies ECHO - 10/14/21- Per Dr. Teryl Lucy "No significant abnormalities.  No further work-up recommended prior to his surgery" Cardiac Cath - denies  Sleep Study - denies Aspirin Instructions:Patient does not take any blood thinners or Aspirin  ERAS Protcol - ERAS + drink ordered- ensure provided to patient at PAT appointment.   COVID TEST- N/A  Anesthesia review: N/A  Patient denies shortness of breath, fever, cough and chest pain at PAT appointment   All instructions explained to the patient, with a verbal understanding of the material. Patient agrees to go over the instructions while at home for a better understanding. Patient also instructed to self quarantine after being tested for COVID-19. The opportunity to ask questions was provided.

## 2021-11-05 ENCOUNTER — Other Ambulatory Visit: Payer: Self-pay | Admitting: Physician Assistant

## 2021-11-05 MED ORDER — OXYCODONE HCL 5 MG PO TABS
ORAL_TABLET | ORAL | 0 refills | Status: DC
Start: 2021-11-05 — End: 2021-11-22

## 2021-11-05 MED ORDER — DOCUSATE SODIUM 100 MG PO CAPS: 100.0000 mg | ORAL_CAPSULE | Freq: Every day | ORAL | 2 refills | Status: DC | PRN

## 2021-11-05 MED ORDER — ONDANSETRON HCL 4 MG PO TABS: 4.0000 mg | ORAL_TABLET | Freq: Three times a day (TID) | ORAL | 0 refills | Status: DC | PRN

## 2021-11-05 MED ORDER — RIVAROXABAN 10 MG PO TABS: 10.0000 mg | ORAL_TABLET | Freq: Every day | ORAL | 0 refills | Status: DC

## 2021-11-05 MED ORDER — METHOCARBAMOL 750 MG PO TABS: 750.0000 mg | ORAL_TABLET | Freq: Two times a day (BID) | ORAL | 2 refills | Status: DC | PRN

## 2021-11-10 ENCOUNTER — Telehealth: Payer: Self-pay | Admitting: *Deleted

## 2021-11-10 ENCOUNTER — Encounter (HOSPITAL_COMMUNITY): Payer: Self-pay | Admitting: Orthopaedic Surgery

## 2021-11-10 MED ORDER — TRANEXAMIC ACID 1000 MG/10ML IV SOLN
2000.0000 mg | INTRAVENOUS | Status: DC
  Filled 2021-11-10: qty 20

## 2021-11-10 NOTE — Telephone Encounter (Signed)
Ortho bundle pre-op call completed. 

## 2021-11-10 NOTE — Anesthesia Preprocedure Evaluation (Signed)
Anesthesia Evaluation  Patient identified by MRN, date of birth, ID band Patient awake    Reviewed: Allergy & Precautions, NPO status , Patient's Chart, lab work & pertinent test results  Airway Mallampati: I       Dental  (+) Edentulous Upper, Edentulous Lower, Upper Dentures   Pulmonary pneumonia, resolved, former smoker,    Pulmonary exam normal breath sounds clear to auscultation       Cardiovascular hypertension, Pt. on medications Normal cardiovascular exam Rhythm:Regular Rate:Normal     Neuro/Psych PSYCHIATRIC DISORDERS Anxiety Bipolar Disorder negative neurological ROS     GI/Hepatic GERD  Medicated,(+)     substance abuse  , Hx/o ulcerative colitis Hx/o Colon Ca   Endo/Other  Hyperlipidemia  Renal/GU Renal disease  negative genitourinary   Musculoskeletal  (+) Arthritis , Osteoarthritis,    Abdominal (+) + obese,   Peds  Hematology negative hematology ROS (+)   Anesthesia Other Findings   Reproductive/Obstetrics                            Anesthesia Physical Anesthesia Plan  ASA: 2  Anesthesia Plan: Spinal   Post-op Pain Management: Regional block*, Tylenol PO (pre-op)*, Ketamine IV*, Dilaudid IV and Precedex   Induction: Intravenous  PONV Risk Score and Plan: 2 and Treatment may vary due to age or medical condition, Propofol infusion and Ondansetron  Airway Management Planned: Natural Airway  Additional Equipment: None  Intra-op Plan:   Post-operative Plan:   Informed Consent: I have reviewed the patients History and Physical, chart, labs and discussed the procedure including the risks, benefits and alternatives for the proposed anesthesia with the patient or authorized representative who has indicated his/her understanding and acceptance.     Dental advisory given  Plan Discussed with: CRNA and Anesthesiologist  Anesthesia Plan Comments:         Anesthesia Quick Evaluation

## 2021-11-10 NOTE — Care Plan (Signed)
Call to patient to discuss his upcoming Right total knee arthroplasty with Dr. Erlinda Hong on 11/11/21. Patient is an Ortho bundle through [facility] and is agreeable to case management. He lives alone, but has a brother that lives just a few apartments down from him. Also he mentioned that [facility] reached out to him with a phone number for personal home care services. CM did call the number provided by patient from [facility] and reached an answering service for [facility] and left message requesting call back to discuss how many hours and if patient is eligible for personal care vs. [facility] services. Anticipate HHPT will be needed after short hospital stay. Referral made to [facility] for PT needs after choice provided. Patient states he has already received his RW and CPM from [facility] as ordered prior to surgery. Reviewed post op care instructions. Will follow for needs.

## 2021-11-11 ENCOUNTER — Other Ambulatory Visit: Payer: Self-pay

## 2021-11-11 ENCOUNTER — Encounter (HOSPITAL_COMMUNITY): Payer: Self-pay | Admitting: Orthopaedic Surgery

## 2021-11-11 ENCOUNTER — Observation Stay (HOSPITAL_COMMUNITY): Payer: Medicare HMO

## 2021-11-11 ENCOUNTER — Ambulatory Visit (HOSPITAL_COMMUNITY): Payer: Medicare HMO | Admitting: Anesthesiology

## 2021-11-11 ENCOUNTER — Ambulatory Visit (HOSPITAL_BASED_OUTPATIENT_CLINIC_OR_DEPARTMENT_OTHER): Payer: Medicare HMO | Admitting: Anesthesiology

## 2021-11-11 ENCOUNTER — Encounter (HOSPITAL_COMMUNITY)
Admission: RE | Disposition: A | Discharge status: Home / Self Care | Payer: Self-pay | Source: Ambulatory Visit | Attending: Orthopaedic Surgery

## 2021-11-11 ENCOUNTER — Observation Stay (HOSPITAL_COMMUNITY)
Admission: RE | Admit: 2021-11-11 | Discharge: 2021-11-12 | Disposition: A | Discharge status: Home / Self Care | Payer: Medicare HMO | Source: Ambulatory Visit | Attending: Orthopaedic Surgery | Admitting: Orthopaedic Surgery

## 2021-11-11 DIAGNOSIS — R2689 Other abnormalities of gait and mobility: Secondary | ICD-10-CM | POA: Diagnosis not present

## 2021-11-11 DIAGNOSIS — Z471 Aftercare following joint replacement surgery: Secondary | ICD-10-CM | POA: Diagnosis not present

## 2021-11-11 DIAGNOSIS — Z6832 Body mass index (BMI) 32.0-32.9, adult: Secondary | ICD-10-CM | POA: Diagnosis not present

## 2021-11-11 DIAGNOSIS — M1711 Unilateral primary osteoarthritis, right knee: Principal | ICD-10-CM

## 2021-11-11 DIAGNOSIS — Z96651 Presence of right artificial knee joint: Secondary | ICD-10-CM

## 2021-11-11 DIAGNOSIS — E785 Hyperlipidemia, unspecified: Secondary | ICD-10-CM | POA: Diagnosis not present

## 2021-11-11 DIAGNOSIS — Z8616 Personal history of COVID-19: Secondary | ICD-10-CM | POA: Insufficient documentation

## 2021-11-11 DIAGNOSIS — Z886 Allergy status to analgesic agent status: Secondary | ICD-10-CM | POA: Insufficient documentation

## 2021-11-11 DIAGNOSIS — Z85038 Personal history of other malignant neoplasm of large intestine: Secondary | ICD-10-CM | POA: Insufficient documentation

## 2021-11-11 DIAGNOSIS — D62 Acute posthemorrhagic anemia: Secondary | ICD-10-CM | POA: Insufficient documentation

## 2021-11-11 DIAGNOSIS — Z8249 Family history of ischemic heart disease and other diseases of the circulatory system: Secondary | ICD-10-CM | POA: Insufficient documentation

## 2021-11-11 DIAGNOSIS — Z79899 Other long term (current) drug therapy: Secondary | ICD-10-CM | POA: Insufficient documentation

## 2021-11-11 DIAGNOSIS — M25461 Effusion, right knee: Secondary | ICD-10-CM | POA: Diagnosis not present

## 2021-11-11 DIAGNOSIS — K219 Gastro-esophageal reflux disease without esophagitis: Secondary | ICD-10-CM | POA: Insufficient documentation

## 2021-11-11 DIAGNOSIS — E669 Obesity, unspecified: Secondary | ICD-10-CM | POA: Diagnosis not present

## 2021-11-11 DIAGNOSIS — F319 Bipolar disorder, unspecified: Secondary | ICD-10-CM | POA: Diagnosis not present

## 2021-11-11 DIAGNOSIS — M25561 Pain in right knee: Secondary | ICD-10-CM | POA: Diagnosis not present

## 2021-11-11 DIAGNOSIS — H409 Unspecified glaucoma: Secondary | ICD-10-CM | POA: Insufficient documentation

## 2021-11-11 DIAGNOSIS — Z87891 Personal history of nicotine dependence: Secondary | ICD-10-CM | POA: Diagnosis not present

## 2021-11-11 DIAGNOSIS — F1211 Cannabis abuse, in remission: Secondary | ICD-10-CM | POA: Diagnosis not present

## 2021-11-11 DIAGNOSIS — F419 Anxiety disorder, unspecified: Secondary | ICD-10-CM | POA: Insufficient documentation

## 2021-11-11 DIAGNOSIS — Z8719 Personal history of other diseases of the digestive system: Secondary | ICD-10-CM | POA: Insufficient documentation

## 2021-11-11 DIAGNOSIS — Z8701 Personal history of pneumonia (recurrent): Secondary | ICD-10-CM | POA: Diagnosis not present

## 2021-11-11 DIAGNOSIS — G8918 Other acute postprocedural pain: Secondary | ICD-10-CM | POA: Diagnosis not present

## 2021-11-11 DIAGNOSIS — I10 Essential (primary) hypertension: Secondary | ICD-10-CM | POA: Diagnosis not present

## 2021-11-11 HISTORY — PX: TOTAL KNEE ARTHROPLASTY: SHX125

## 2021-11-11 SURGERY — ARTHROPLASTY, KNEE, TOTAL
Anesthesia: Spinal | Site: Knee | Laterality: Right

## 2021-11-11 MED ORDER — TRANEXAMIC ACID 1000 MG/10ML IV SOLN
INTRAVENOUS | Status: DC | PRN
  Administered 2021-11-11: 2000 mg via TOPICAL

## 2021-11-11 MED ORDER — PROPOFOL 10 MG/ML IV BOLUS
INTRAVENOUS | Status: DC | PRN
  Administered 2021-11-11: 10 mg via INTRAVENOUS

## 2021-11-11 MED ORDER — VANCOMYCIN HCL 1000 MG IV SOLR
INTRAVENOUS | Status: AC
  Filled 2021-11-11: qty 20

## 2021-11-11 MED ORDER — OXYCODONE HCL 5 MG/5ML PO SOLN: 5.0000 mg | Freq: Once | ORAL | Status: DC | PRN

## 2021-11-11 MED ORDER — BUPIVACAINE IN DEXTROSE 0.75-8.25 % IT SOLN
INTRATHECAL | Status: DC | PRN
  Administered 2021-11-11: 1.7 mL via INTRATHECAL

## 2021-11-11 MED ORDER — LACTATED RINGERS IV SOLN: INTRAVENOUS | Status: DC

## 2021-11-11 MED ORDER — OXYCODONE HCL 5 MG PO TABS: 10.0000 mg | ORAL_TABLET | ORAL | Status: DC | PRN

## 2021-11-11 MED ORDER — AMLODIPINE BESYLATE 5 MG PO TABS
5.0000 mg | ORAL_TABLET | Freq: Every day | ORAL | Status: DC
  Administered 2021-11-12: 5 mg via ORAL
  Filled 2021-11-11: qty 1

## 2021-11-11 MED ORDER — OXYCODONE HCL ER 10 MG PO T12A
10.0000 mg | EXTENDED_RELEASE_TABLET | Freq: Two times a day (BID) | ORAL | Status: DC
  Administered 2021-11-11 – 2021-11-12 (×2): 10 mg via ORAL
  Filled 2021-11-11 (×2): qty 1

## 2021-11-11 MED ORDER — OXYCODONE HCL 5 MG PO TABS
ORAL_TABLET | ORAL | Status: AC
  Filled 2021-11-11: qty 1

## 2021-11-11 MED ORDER — 0.9 % SODIUM CHLORIDE (POUR BTL) OPTIME
TOPICAL | Status: DC | PRN
  Administered 2021-11-11: 1000 mL

## 2021-11-11 MED ORDER — CHLORHEXIDINE GLUCONATE 0.12 % MT SOLN
15.0000 mL | Freq: Once | OROMUCOSAL | Status: AC
  Administered 2021-11-11: 15 mL via OROMUCOSAL
  Filled 2021-11-11: qty 15

## 2021-11-11 MED ORDER — ACETAMINOPHEN 500 MG PO TABS
1000.0000 mg | ORAL_TABLET | Freq: Four times a day (QID) | ORAL | Status: DC
  Filled 2021-11-11: qty 2

## 2021-11-11 MED ORDER — BUPIVACAINE-MELOXICAM ER 400-12 MG/14ML IJ SOLN
INTRAMUSCULAR | Status: AC
  Filled 2021-11-11: qty 1

## 2021-11-11 MED ORDER — SODIUM CHLORIDE 0.9 % IR SOLN
Status: DC | PRN
  Administered 2021-11-11: 1000 mL

## 2021-11-11 MED ORDER — ONDANSETRON HCL 4 MG PO TABS: 4.0000 mg | ORAL_TABLET | Freq: Four times a day (QID) | ORAL | Status: DC | PRN

## 2021-11-11 MED ORDER — OXYCODONE HCL 5 MG PO TABS: 5.0000 mg | ORAL_TABLET | Freq: Once | ORAL | Status: DC | PRN

## 2021-11-11 MED ORDER — OXYCODONE HCL 5 MG PO TABS
5.0000 mg | ORAL_TABLET | ORAL | Status: DC | PRN
  Administered 2021-11-11 (×2): 5 mg via ORAL

## 2021-11-11 MED ORDER — DOCUSATE SODIUM 100 MG PO CAPS
100.0000 mg | ORAL_CAPSULE | Freq: Two times a day (BID) | ORAL | Status: DC
  Administered 2021-11-11: 100 mg via ORAL
  Filled 2021-11-11 (×2): qty 1

## 2021-11-11 MED ORDER — PHENYLEPHRINE 80 MCG/ML (10ML) SYRINGE FOR IV PUSH (FOR BLOOD PRESSURE SUPPORT)
PREFILLED_SYRINGE | INTRAVENOUS | Status: DC | PRN
  Administered 2021-11-11: 40 ug via INTRAVENOUS

## 2021-11-11 MED ORDER — SODIUM CHLORIDE 0.9 % IV SOLN: INTRAVENOUS | Status: DC

## 2021-11-11 MED ORDER — METOCLOPRAMIDE HCL 5 MG PO TABS: 5.0000 mg | ORAL_TABLET | Freq: Three times a day (TID) | ORAL | Status: DC | PRN

## 2021-11-11 MED ORDER — TRANEXAMIC ACID-NACL 1000-0.7 MG/100ML-% IV SOLN
1000.0000 mg | INTRAVENOUS | Status: AC
  Administered 2021-11-11: 1000 mg via INTRAVENOUS
  Filled 2021-11-11: qty 100

## 2021-11-11 MED ORDER — PROPOFOL 500 MG/50ML IV EMUL
INTRAVENOUS | Status: DC | PRN
  Administered 2021-11-11: 50 ug/kg/min via INTRAVENOUS

## 2021-11-11 MED ORDER — VANCOMYCIN HCL 1000 MG IV SOLR
INTRAVENOUS | Status: DC | PRN
  Administered 2021-11-11: 1000 mg

## 2021-11-11 MED ORDER — SULFAMETHOXAZOLE-TRIMETHOPRIM 800-160 MG PO TABS: 1.0000 | ORAL_TABLET | Freq: Two times a day (BID) | ORAL | 0 refills | Status: DC

## 2021-11-11 MED ORDER — ACETAMINOPHEN 325 MG PO TABS: 325.0000 mg | ORAL_TABLET | Freq: Four times a day (QID) | ORAL | Status: DC | PRN

## 2021-11-11 MED ORDER — HYDROMORPHONE HCL 1 MG/ML IJ SOLN: 0.5000 mg | INTRAMUSCULAR | Status: DC | PRN

## 2021-11-11 MED ORDER — KETOROLAC TROMETHAMINE 15 MG/ML IJ SOLN
7.5000 mg | Freq: Four times a day (QID) | INTRAMUSCULAR | Status: DC
  Administered 2021-11-11 – 2021-11-12 (×3): 7.5 mg via INTRAVENOUS
  Filled 2021-11-11 (×3): qty 1

## 2021-11-11 MED ORDER — PROPOFOL 10 MG/ML IV BOLUS
INTRAVENOUS | Status: AC
  Filled 2021-11-11: qty 20

## 2021-11-11 MED ORDER — PRONTOSAN WOUND IRRIGATION OPTIME
TOPICAL | Status: DC | PRN
  Administered 2021-11-11: 1

## 2021-11-11 MED ORDER — CEFAZOLIN SODIUM-DEXTROSE 2-4 GM/100ML-% IV SOLN
2.0000 g | INTRAVENOUS | Status: AC
  Administered 2021-11-11: 2 g via INTRAVENOUS
  Filled 2021-11-11: qty 100

## 2021-11-11 MED ORDER — POVIDONE-IODINE 10 % EX SWAB
2.0000 "application " | Freq: Once | CUTANEOUS | Status: AC
  Administered 2021-11-11: 2 via TOPICAL

## 2021-11-11 MED ORDER — RIVAROXABAN 10 MG PO TABS
10.0000 mg | ORAL_TABLET | Freq: Every day | ORAL | Status: DC
  Administered 2021-11-12: 10 mg via ORAL
  Filled 2021-11-11: qty 1

## 2021-11-11 MED ORDER — ONDANSETRON HCL 4 MG/2ML IJ SOLN: 4.0000 mg | Freq: Four times a day (QID) | INTRAMUSCULAR | Status: DC | PRN

## 2021-11-11 MED ORDER — FENTANYL CITRATE (PF) 250 MCG/5ML IJ SOLN
INTRAMUSCULAR | Status: DC | PRN
  Administered 2021-11-11 (×2): 50 ug via INTRAVENOUS

## 2021-11-11 MED ORDER — METHOCARBAMOL 500 MG PO TABS: 500.0000 mg | ORAL_TABLET | Freq: Four times a day (QID) | ORAL | Status: DC | PRN

## 2021-11-11 MED ORDER — METOCLOPRAMIDE HCL 5 MG/ML IJ SOLN: 5.0000 mg | Freq: Three times a day (TID) | INTRAMUSCULAR | Status: DC | PRN

## 2021-11-11 MED ORDER — MENTHOL 3 MG MT LOZG
1.0000 | LOZENGE | OROMUCOSAL | Status: DC | PRN
Start: 2021-11-11 — End: 2021-11-12

## 2021-11-11 MED ORDER — DEXAMETHASONE SODIUM PHOSPHATE 10 MG/ML IJ SOLN
10.0000 mg | Freq: Once | INTRAMUSCULAR | Status: AC
  Administered 2021-11-12: 10 mg via INTRAVENOUS
  Filled 2021-11-11 (×2): qty 1

## 2021-11-11 MED ORDER — ONDANSETRON HCL 4 MG/2ML IJ SOLN: 4.0000 mg | Freq: Once | INTRAMUSCULAR | Status: DC | PRN

## 2021-11-11 MED ORDER — FENTANYL CITRATE (PF) 250 MCG/5ML IJ SOLN
INTRAMUSCULAR | Status: AC
  Filled 2021-11-11: qty 5

## 2021-11-11 MED ORDER — ROPIVACAINE HCL 7.5 MG/ML IJ SOLN
INTRAMUSCULAR | Status: DC | PRN
  Administered 2021-11-11: 20 mL via PERINEURAL

## 2021-11-11 MED ORDER — BUPIVACAINE-MELOXICAM ER 400-12 MG/14ML IJ SOLN
INTRAMUSCULAR | Status: DC | PRN
  Administered 2021-11-11: 400 mg

## 2021-11-11 MED ORDER — CEFAZOLIN SODIUM-DEXTROSE 2-4 GM/100ML-% IV SOLN
2.0000 g | Freq: Four times a day (QID) | INTRAVENOUS | Status: AC
  Administered 2021-11-11: 2 g via INTRAVENOUS
  Filled 2021-11-11: qty 100

## 2021-11-11 MED ORDER — TRANEXAMIC ACID-NACL 1000-0.7 MG/100ML-% IV SOLN
1000.0000 mg | Freq: Once | INTRAVENOUS | Status: DC
  Filled 2021-11-11: qty 100

## 2021-11-11 MED ORDER — FERROUS SULFATE 325 (65 FE) MG PO TABS
325.0000 mg | ORAL_TABLET | Freq: Three times a day (TID) | ORAL | Status: DC
  Administered 2021-11-11 – 2021-11-12 (×2): 325 mg via ORAL
  Filled 2021-11-11 (×2): qty 1

## 2021-11-11 MED ORDER — PHENOL 1.4 % MT LIQD
1.0000 | OROMUCOSAL | Status: DC | PRN
Start: 2021-11-11 — End: 2021-11-12

## 2021-11-11 MED ORDER — LIDOCAINE 2% (20 MG/ML) 5 ML SYRINGE
INTRAMUSCULAR | Status: DC | PRN
  Administered 2021-11-11: 20 mg via INTRAVENOUS

## 2021-11-11 MED ORDER — ORAL CARE MOUTH RINSE: 15.0000 mL | Freq: Once | OROMUCOSAL | Status: AC

## 2021-11-11 MED ORDER — METHOCARBAMOL 1000 MG/10ML IJ SOLN: 500.0000 mg | Freq: Four times a day (QID) | INTRAVENOUS | Status: DC | PRN

## 2021-11-11 MED ORDER — HYDROMORPHONE HCL 1 MG/ML IJ SOLN: 0.2500 mg | INTRAMUSCULAR | Status: DC | PRN

## 2021-11-11 SURGICAL SUPPLY — 82 items
ALCOHOL 70% 16 OZ (MISCELLANEOUS) ×2 IMPLANT
BAG COUNTER SPONGE SURGICOUNT (BAG) IMPLANT
BAG DECANTER FOR FLEXI CONT (MISCELLANEOUS) ×2 IMPLANT
BANDAGE ESMARK 6X9 LF (GAUZE/BANDAGES/DRESSINGS) IMPLANT
BLADE SAG 18X100X1.27 (BLADE) ×2 IMPLANT
BNDG ESMARK 6X9 LF (GAUZE/BANDAGES/DRESSINGS)
BOWL SMART MIX CTS (DISPOSABLE) ×2 IMPLANT
CEMENT BONE REFOBACIN R1X40 US (Cement) ×2 IMPLANT
CLSR STERI-STRIP ANTIMIC 1/2X4 (GAUZE/BANDAGES/DRESSINGS) ×4 IMPLANT
COMP FEM PERSONA RT SZ11 (Knees) ×2 IMPLANT
COMPONENT FEM PERSONA RT SZ11 (Knees) IMPLANT
COOLER ICEMAN CLASSIC (MISCELLANEOUS) ×2 IMPLANT
COVER SURGICAL LIGHT HANDLE (MISCELLANEOUS) ×2 IMPLANT
CUFF TOURN SGL QUICK 34 (TOURNIQUET CUFF) ×1
CUFF TRNQT CYL 34X4.125X (TOURNIQUET CUFF) ×1 IMPLANT
DERMABOND ADVANCED (GAUZE/BANDAGES/DRESSINGS) ×1
DERMABOND ADVANCED .7 DNX12 (GAUZE/BANDAGES/DRESSINGS) IMPLANT
DRAPE EXTREMITY T 121X128X90 (DISPOSABLE) ×2 IMPLANT
DRAPE HALF SHEET 40X57 (DRAPES) ×2 IMPLANT
DRAPE INCISE IOBAN 66X45 STRL (DRAPES) ×2 IMPLANT
DRAPE ORTHO SPLIT 77X108 STRL (DRAPES) ×2
DRAPE POUCH INSTRU U-SHP 10X18 (DRAPES) ×2 IMPLANT
DRAPE SURG ORHT 6 SPLT 77X108 (DRAPES) ×2 IMPLANT
DRAPE U-SHAPE 47X51 STRL (DRAPES) ×4 IMPLANT
DRSG AQUACEL AG ADV 3.5X10 (GAUZE/BANDAGES/DRESSINGS) ×1 IMPLANT
DRSG AQUACEL AG ADV 3.5X14 (GAUZE/BANDAGES/DRESSINGS) ×1 IMPLANT
DURAPREP 26ML APPLICATOR (WOUND CARE) ×6 IMPLANT
ELECT CAUTERY BLADE 6.4 (BLADE) ×2 IMPLANT
ELECT REM PT RETURN 9FT ADLT (ELECTROSURGICAL) ×2
ELECTRODE REM PT RTRN 9FT ADLT (ELECTROSURGICAL) ×1 IMPLANT
GLOVE BIOGEL PI IND STRL 7.0 (GLOVE) ×1 IMPLANT
GLOVE BIOGEL PI IND STRL 7.5 (GLOVE) ×4 IMPLANT
GLOVE BIOGEL PI INDICATOR 7.0 (GLOVE) ×1
GLOVE BIOGEL PI INDICATOR 7.5 (GLOVE) ×4
GLOVE ECLIPSE 7.0 STRL STRAW (GLOVE) ×6 IMPLANT
GLOVE SKINSENSE NS SZ7.5 (GLOVE) ×3
GLOVE SKINSENSE STRL SZ7.5 (GLOVE) ×3 IMPLANT
GLOVE SURG UNDER LTX SZ7.5 (GLOVE) ×4 IMPLANT
GLOVE SURG UNDER POLY LF SZ7 (GLOVE) ×4 IMPLANT
GOWN STRL REIN XL XLG (GOWN DISPOSABLE) ×2 IMPLANT
GOWN STRL REUS W/ TWL LRG LVL3 (GOWN DISPOSABLE) ×1 IMPLANT
GOWN STRL REUS W/TWL LRG LVL3 (GOWN DISPOSABLE) ×1
HANDPIECE INTERPULSE COAX TIP (DISPOSABLE) ×1
HDLS TROCR DRIL PIN KNEE 75 (PIN) ×1
HOOD PEEL AWAY FLYTE STAYCOOL (MISCELLANEOUS) ×4 IMPLANT
INSERT TIB ASF PS 8-11 GH RT (Insert) ×1 IMPLANT
JET LAVAGE IRRISEPT WOUND (IRRIGATION / IRRIGATOR) ×2
KIT BASIN OR (CUSTOM PROCEDURE TRAY) ×2 IMPLANT
KIT TURNOVER KIT B (KITS) ×2 IMPLANT
LAVAGE JET IRRISEPT WOUND (IRRIGATION / IRRIGATOR) ×1 IMPLANT
MANIFOLD NEPTUNE II (INSTRUMENTS) ×2 IMPLANT
MARKER SKIN DUAL TIP RULER LAB (MISCELLANEOUS) ×4 IMPLANT
NDL SPNL 18GX3.5 QUINCKE PK (NEEDLE) ×1 IMPLANT
NEEDLE SPNL 18GX3.5 QUINCKE PK (NEEDLE) ×2 IMPLANT
NS IRRIG 1000ML POUR BTL (IV SOLUTION) ×2 IMPLANT
PACK TOTAL JOINT (CUSTOM PROCEDURE TRAY) ×2 IMPLANT
PAD ARMBOARD 7.5X6 YLW CONV (MISCELLANEOUS) ×4 IMPLANT
PAD COLD SHLDR WRAP-ON (PAD) ×2 IMPLANT
PIN DRILL HDLS TROCAR 75 4PK (PIN) IMPLANT
SAW OSC TIP CART 19.5X105X1.3 (SAW) ×2 IMPLANT
SCREW FEMALE HEX FIX 25X2.5 (ORTHOPEDIC DISPOSABLE SUPPLIES) ×1 IMPLANT
SET HNDPC FAN SPRY TIP SCT (DISPOSABLE) ×1 IMPLANT
SOLUTION PRONTOSAN WOUND 350ML (IRRIGATION / IRRIGATOR) ×1 IMPLANT
STAPLER VISISTAT 35W (STAPLE) IMPLANT
STEM POLY PAT PLY 35M KNEE (Knees) ×1 IMPLANT
STEM TIBIA 5 DEG SZ G R KNEE (Knees) IMPLANT
SUCTION FRAZIER HANDLE 10FR (MISCELLANEOUS) ×1
SUCTION TUBE FRAZIER 10FR DISP (MISCELLANEOUS) ×1 IMPLANT
SUT ETHILON 2 0 FS 18 (SUTURE) ×1 IMPLANT
SUT MNCRL AB 3-0 PS2 27 (SUTURE) IMPLANT
SUT VIC AB 0 CT1 27 (SUTURE) ×2
SUT VIC AB 0 CT1 27XBRD ANBCTR (SUTURE) ×2 IMPLANT
SUT VIC AB 1 CTX 27 (SUTURE) ×7 IMPLANT
SUT VIC AB 2-0 CT1 27 (SUTURE) ×6
SUT VIC AB 2-0 CT1 TAPERPNT 27 (SUTURE) ×4 IMPLANT
SYR 50ML LL SCALE MARK (SYRINGE) ×4 IMPLANT
TIBIA STEM 5 DEG SZ G R KNEE (Knees) ×2 IMPLANT
TOWEL GREEN STERILE (TOWEL DISPOSABLE) ×2 IMPLANT
TOWEL GREEN STERILE FF (TOWEL DISPOSABLE) ×2 IMPLANT
TRAY CATH 16FR W/PLASTIC CATH (SET/KITS/TRAYS/PACK) IMPLANT
UNDERPAD 30X36 HEAVY ABSORB (UNDERPADS AND DIAPERS) ×2 IMPLANT
YANKAUER SUCT BULB TIP NO VENT (SUCTIONS) ×4 IMPLANT

## 2021-11-11 NOTE — Plan of Care (Signed)
  Problem: Education: Goal: Knowledge of General Education information will improve Description: Including pain rating scale, medication(s)/side effects and non-pharmacologic comfort measures Outcome: Progressing   Problem: Health Behavior/Discharge Planning: Goal: Ability to manage health-related needs will improve Outcome: Progressing   Problem: Clinical Measurements: Goal: Will remain free from infection Outcome: Progressing   Problem: Activity: Goal: Risk for activity intolerance will decrease Outcome: Progressing   Problem: Nutrition: Goal: Adequate nutrition will be maintained Outcome: Progressing   Problem: Pain Managment: Goal: General experience of comfort will improve Outcome: Progressing

## 2021-11-11 NOTE — Anesthesia Procedure Notes (Signed)
Procedure Name: MAC Date/Time: 11/11/2021 7:30 AM Performed by: Mariea Clonts, CRNA Pre-anesthesia Checklist: Patient identified, Emergency Drugs available, Patient being monitored, Timeout performed and Suction available Patient Re-evaluated:Patient Re-evaluated prior to induction Oxygen Delivery Method: Simple face mask

## 2021-11-11 NOTE — Anesthesia Procedure Notes (Addendum)
Anesthesia Regional Block: Adductor canal block   Pre-Anesthetic Checklist: , timeout performed,  Correct Patient, Correct Site, Correct Laterality,  Correct Procedure, Correct Position, site marked,  Risks and benefits discussed,  Surgical consent,  Pre-op evaluation,  At surgeon's request and post-op pain management  Laterality: Right  Prep: chloraprep       Needles:  Injection technique: Single-shot  Needle Type: Echogenic Stimulator Needle     Needle Length: 10cm  Needle Gauge: 21   Needle insertion depth: 7 cm   Additional Needles:   Procedures:,,,, ultrasound used (permanent image in chart),,    Narrative:  Start time: 11/11/2021 7:05 AM End time: 11/11/2021 7:10 AM Injection made incrementally with aspirations every 5 mL.  Performed by: Personally  Anesthesiologist: Josephine Igo, MD  Additional Notes: Timeout performed. Patient sedated. Relevant anatomy ID'd using [place]. Incremental 2-72m injection of LA with frequent aspiration. Patient tolerated procedure well.     Right Adductor Canal Block

## 2021-11-11 NOTE — Transfer of Care (Signed)
Immediate Anesthesia Transfer of Care Note  Patient: Robert Rhodes  Procedure(s) Performed: RIGHT TOTAL KNEE ARTHROPLASTY (Right: Knee)  Patient Location: PACU  Anesthesia Type:Regional and Spinal  Level of Consciousness: awake, alert  and oriented  Airway & Oxygen Therapy: Patient Spontanous Breathing and Patient connected to nasal cannula oxygen  Post-op Assessment: Report given to RN and Post -op Vital signs reviewed and stable  Post vital signs: Reviewed and stable  Last Vitals:  Vitals Value Taken Time  BP    Temp    Pulse 73 11/11/21 0957  Resp 17 11/11/21 0957  SpO2 98 % 11/11/21 0957  Vitals shown include unvalidated device data.  Last Pain:  Vitals:   11/11/21 0610  TempSrc:   PainSc: 0-No pain         Complications: No notable events documented.

## 2021-11-11 NOTE — H&P (Signed)
PREOPERATIVE H&P  Chief Complaint: right knee degenerative joint disease  HPI: Robert Rhodes is a 76 y.o. male who presents for surgical treatment of right knee degenerative joint disease.  He denies any changes in medical history.  Past Medical History:  Diagnosis Date   Adenomatous colon polyp    Arthritis    bilateral knees-needs replacements   Claustrophobia    Colon cancer ([place]) 2015   COVID-19    Eye abnormalities    right eye seen in ED 02/17/2014 wearing eye patch using eye drops   GERD (gastroesophageal reflux disease)    with certain foods   Glaucoma    not on eye drops at this time (10/21/2020)   Hypertension    on meds   Irregular heartbeat    per pt/doesn't know what it does   Osteoarthritis    bilat knees  needs replacement   Osteoporosis    pt denies   Substance abuse ([place])    stopped over 3 years ago   UC (ulcerative colitis) ([place])    Vitamin D deficiency    Past Surgical History:  Procedure Laterality Date   COLON RESECTION N/A 08/23/2013   Procedure: Laparoscopic total abdominal colectomy and hernia repair;  Surgeon: Leighton Ruff, MD;  Location: [facility] ORS;  Service: General;  Laterality: N/A;   COLON SURGERY  08/2013   COLONOSCOPY  11/02/2020   EUS N/A 07/18/2013   Procedure: UPPER ENDOSCOPIC ULTRASOUND (EUS) RADIAL;  Surgeon: Milus Banister, MD;  Location: [facility] END[facility]COPY;  Service: Endoscopy;  Laterality: N/A;   EYE SURGERY     FLEXIBLE SIGMOIDOSCOPY N/A 2020   prep good   HERNIA REPAIR  2005   In [place]/put in a mesh   INCISIONAL HERNIA REPAIR N/A 02/20/2014   Procedure: LAP ASSISTED INCISIONAL HERNIA REPAIR LYSIS OF ADHESIONS;  Surgeon: Leighton Ruff, MD;  Location: [facility] ORS;  Service: General;  Laterality: N/A;  converted to open @ [place] N/A 02/20/2014   Procedure: HERNIA REPAIR INCISIONAL;  Surgeon: Leighton Ruff, MD;  Location: [facility];  Service: General;  Laterality: N/A;  With MESH   INTRAOCULAR LENS INSERTION Bilateral     6 yrs ago   VENTRAL HERNIA REPAIR     Social History   Socioeconomic History   Marital status: Single    Spouse name: Not on file   Number of children: 0   Years of education: Not on file   Highest education level: Not on file  Occupational History   Occupation: retired  Tobacco Use   Smoking status: Former    Packs/day: 0.00    Types: Cigarettes    Quit date: 06/14/2003    Years since quitting: 18.4   Smokeless tobacco: Never   Tobacco comments:    quit in July 2005  Vaping Use   Vaping Use: Never used  Substance and Sexual Activity   Alcohol use: No   Drug use: Not Currently    Types: 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  Other Topics Concern   Not on file  Social History Narrative   Not on file   Social Determinants of Health   Financial Resource Strain: Not on file  Food Insecurity: Not on file  Transportation Needs: Not on file  Physical Activity: Not on file  Stress: Not on file  Social Connections: Not on file   Family History  Problem Relation Age of Onset  Cancer Mother        type unknown-in her leg   Diabetes Father    Heart disease Father    Heart disease Sister    Heart attack Brother    Healthy Sister    Colon cancer Neg Hx    Esophageal cancer Neg Hx    Rectal cancer Neg Hx    Stomach cancer Neg Hx    Colon polyps Neg Hx    Allergies  Allergen Reactions   Ace Inhibitors Other (See Comments)    Unknown reaction- "affecting my breathing in some kind of way"   Advil [Ibuprofen] Other (See Comments)    Muscle tightness   Aspirin Nausea And Vomiting   Tylenol [Acetaminophen] Other (See Comments)    Muscle tightness   Prior to Admission medications   Medication Sig Start Date End Date Taking? Authorizing Provider  amLODipine (NORVASC) 5 MG tablet TAKE 1 TABLET BY MOUTH ONCE DAILY- HYPERTENSION 09/16/21  Yes Newlin, Charlane Ferretti, MD  atorvastatin (LIPITOR) 20 MG tablet Take 1 tablet (20 mg total) by mouth  daily. 09/16/21  Yes [name], MD  docusate sodium (COLACE) 100 MG capsule Take 1 capsule (100 mg total) by mouth daily as needed. 11/05/21 11/05/22  Aundra Dubin, PA-C  FIBER ADULT GUMMIES PO Take 2 tablets by mouth daily.   Yes [provider]  magnesium oxide (MAG-OX) 400 MG tablet Take 400 mg by mouth daily.   Yes [provider]  methocarbamol (ROBAXIN-750) 750 MG tablet Take 1 tablet (750 mg total) by mouth 2 (two) times daily as needed for muscle spasms. 11/05/21   Aundra Dubin, PA-C  omeprazole (PRILOSEC OTC) 20 MG tablet Take 20 mg by mouth daily as needed (acid reflux).   Yes [provider]  ondansetron (ZOFRAN) 4 MG tablet Take 1 tablet (4 mg total) by mouth every 8 (eight) hours as needed for nausea or vomiting. 11/05/21   Aundra Dubin, PA-C  oxyCODONE (ROXICODONE) 5 MG immediate release tablet Take 1-2 tabs po every 8 hours prn pain.  To be taken after surgery 11/05/21   Aundra Dubin, PA-C  Potassium 99 MG TABS Take 99 mg by mouth daily.   Yes [provider]  rivaroxaban (XARELTO) 10 MG TABS tablet Take 1 tablet (10 mg total) by mouth daily. To be taken after surgery to prevent blood clots 11/05/21   Aundra Dubin, PA-C     Positive ROS: All other systems have been reviewed and were otherwise negative with the exception of those mentioned in the HPI and as above.  Physical Exam: General: Alert, no acute distress Cardiovascular: No pedal edema Respiratory: No cyanosis, no use of accessory musculature GI: abdomen soft Skin: No lesions in the area of chief complaint Neurologic: Sensation intact distally Psychiatric: Patient is competent for consent with normal mood and affect Lymphatic: no lymphedema  MUSCULOSKELETAL: exam stable  Assessment: right knee degenerative joint disease  Plan: Plan for Procedure(s): RIGHT TOTAL KNEE ARTHROPLASTY  The risks benefits and alternatives were discussed with the patient including  but not limited to the risks of nonoperative treatment, versus surgical intervention including infection, bleeding, nerve injury,  blood clots, cardiopulmonary complications, morbidity, mortality, among others, and they were willing to proceed.   Preoperative templating of the joint replacement has been completed, documented, and submitted to the Operating Room personnel in order to optimize intra-operative equipment management.   Eduard Roux, MD 11/11/2021 6:11 AM

## 2021-11-11 NOTE — Discharge Instructions (Signed)

## 2021-11-11 NOTE — Progress Notes (Signed)
Patient up to recliner doing exercises that PT has shown him.  Patient did ambulate with therapy has well

## 2021-11-11 NOTE — Progress Notes (Signed)
Pharmacy Note  RN called to notify patient reports an allergy to tylenol (listed as "muscle tightness"). Patient refused medication and requested this medication be d/c'd.   Arturo Morton, PharmD, BCPS Please check AMION for all [place] contact numbers Clinical Pharmacist 11/11/2021 5:44 PM

## 2021-11-11 NOTE — Evaluation (Signed)
Physical Therapy Evaluation Patient Details Name: Robert Rhodes MRN: [id number] DOB: July 04, 1945 Today's Date: 11/11/2021  History of Present Illness  Pt is a 76 y/o male s/p R TKA. PMH includes colon cancer, glaucoma, HTN, COVID, and substance abuse.  Clinical Impression  Pt is s/p surgery above with deficits below. Pt tolerated ambulation well and required min guard to min A for mobility tasks using RW. Educated about knee precautions and seated HEP. Reports brother can assist as needed at d/c. Will continue to follow acutely.      Recommendations for follow up therapy are one component of a multi-disciplinary discharge planning process, led by the attending physician.  Recommendations may be updated based on patient status, additional functional criteria and insurance authorization.  Follow Up Recommendations Follow physician's recommendations for discharge plan and follow up therapies    Assistance Recommended at Discharge Intermittent Supervision/Assistance  Patient can return home with the following  Help with stairs or ramp for entrance;Assist for transportation;Assistance with cooking/housework    Equipment Recommendations BSC/3in1  Recommendations for Other Services       Functional Status Assessment Patient has had a recent decline in their functional status and demonstrates the ability to make significant improvements in function in a reasonable and predictable amount of time.     Precautions / Restrictions Precautions Precautions: Knee Precaution Booklet Issued: No Precaution Comments: Verbally reviewed knee precautions. Restrictions Weight Bearing Restrictions: Yes RLE Weight Bearing: Weight bearing as tolerated      Mobility  Bed Mobility               General bed mobility comments: In recliner upon entry    Transfers Overall transfer level: Needs assistance Equipment used: Rolling walker (2 wheels) Transfers: Sit to/from Stand Sit to Stand: Min  assist           General transfer comment: Min A for steadying. Cues for hand placement.    Ambulation/Gait Ambulation/Gait assistance: Min guard Gait Distance (Feet): 200 Feet Assistive device: Rolling walker (2 wheels) Gait Pattern/deviations: Step-to pattern, Step-through pattern, Decreased weight shift to right, Antalgic Gait velocity: Decreased     General Gait Details: Slow, antalgic gait. Cues for increasing L step length to progress to step through gait. No LOB noted  Stairs            Wheelchair Mobility    Modified Rankin (Stroke Patients Only)       Balance Overall balance assessment: Needs assistance Sitting-balance support: No upper extremity supported Sitting balance-Leahy Scale: Good     Standing balance support: Bilateral upper extremity supported Standing balance-Leahy Scale: Poor Standing balance comment: Reliant on BUE support                             Pertinent Vitals/Pain Pain Assessment Pain Assessment: Faces Faces Pain Scale: Hurts little more Pain Location: R knee Pain Descriptors / Indicators: Guarding, Grimacing Pain Intervention(s): Monitored during session, Limited activity within patient's tolerance, Repositioned    Home Living Family/patient expects to be discharged to:: Private residence Living Arrangements: Alone Available Help at Discharge: Family;Available 24 hours/day Type of Home: Other(Comment) (senior living apartments) Home Access: Level entry       Home Layout: One level Home Equipment: Conservation officer, nature (2 wheels);Shower seat;Grab bars - tub/shower;Grab bars - toilet      Prior Function Prior Level of Function : Independent/Modified Independent             Mobility  Comments: Uses cane for ambulation       Hand Dominance        Extremity/Trunk Assessment   Upper Extremity Assessment Upper Extremity Assessment: Defer to OT evaluation    Lower Extremity Assessment Lower Extremity  Assessment: RLE deficits/detail RLE Deficits / Details: deficits consistent with post op pain and weakness.    Cervical / Trunk Assessment Cervical / Trunk Assessment: Normal  Communication   Communication: No difficulties  Cognition Arousal/Alertness: Awake/alert Behavior During Therapy: Restless Overall Cognitive Status: No family/caregiver present to determine baseline cognitive functioning                                 General Comments: Slightly restless as he reports he is claustrophobic. Fixated on wanting to get his catheter out        General Comments      Exercises Total Joint Exercises Ankle Circles/Pumps: AROM, Both, 20 reps Quad Sets: AROM, 20 reps, Right Long Arc Quad: AROM, Right, 10 reps, Seated Knee Flexion: AROM, Right, 10 reps, Seated   Assessment/Plan    PT Assessment Patient needs continued PT services  PT Problem List Decreased strength;Decreased range of motion;Decreased activity tolerance;Decreased balance;Decreased mobility;Decreased knowledge of use of DME;Decreased knowledge of precautions;Pain       PT Treatment Interventions DME instruction;Gait training;Functional mobility training;Therapeutic activities;Therapeutic exercise;Balance training;Patient/family education    PT Goals (Current goals can be found in the Care Plan section)  Acute Rehab PT Goals Patient Stated Goal: to go home PT Goal Formulation: With patient Time For Goal Achievement: 11/25/21 Potential to Achieve Goals: Good    Frequency 7X/week     Co-evaluation               AM-PAC PT "6 Clicks" Mobility  Outcome Measure Help needed turning from your back to your side while in a flat bed without using bedrails?: None Help needed moving from lying on your back to sitting on the side of a flat bed without using bedrails?: A Little Help needed moving to and from a bed to a chair (including a wheelchair)?: A Little Help needed standing up from a chair  using your arms (e.g., wheelchair or bedside chair)?: A Little Help needed to walk in hospital room?: A Little Help needed climbing 3-5 steps with a railing? : A Little 6 Click Score: 19    End of Session Equipment Utilized During Treatment: Gait belt Activity Tolerance: Patient tolerated treatment well Patient left: in chair;with nursing/sitter in room (in recliner in PACU) Nurse Communication: Mobility status PT Visit Diagnosis: Other abnormalities of gait and mobility (R26.89);Pain Pain - Right/Left: Right Pain - part of body: Knee    Time: 6301-6010 PT Time Calculation (min) (ACUTE ONLY): 17 min   Charges:   PT Evaluation $PT Eval Low Complexity: 1 Low          Robert Rhodes, PT, DPT  Acute Rehabilitation Services  Office: [phone]   Rudean Hitt 11/11/2021, 2:58 PM

## 2021-11-11 NOTE — Op Note (Signed)
Total Knee Arthroplasty Procedure Note  Preoperative diagnosis: Right knee osteoarthritis  Postoperative diagnosis:same  Operative procedure: Right total knee arthroplasty. CPT [id number]  Surgeon: N. Eduard Roux, MD  Assist: Robert Rob, PA-C; necessary for the timely completion of procedure and due to complexity of procedure.  Anesthesia: Spinal, regional, local  Tourniquet time: see anesthesia record  Implants used: Zimmer persona Femur: CR 11 Tibia: G Patella: 35 mm Polyethylene: 12 mm, MC  Indication: Robert Rhodes is a 76 y.o. year old male with a history of knee pain. Having failed conservative management, the patient elected to proceed with a total knee arthroplasty.  We have reviewed the risk and benefits of the surgery and they elected to proceed after voicing understanding.  Procedure:  After informed consent was obtained and understanding of the risk were voiced including but not limited to bleeding, infection, damage to surrounding structures including nerves and vessels, blood clots, leg length inequality and the failure to achieve desired results, the operative extremity was marked with verbal confirmation of the patient in the holding area.   The patient was then brought to the operating room and transported to the operating room table in the supine position.  A tourniquet was applied to the operative extremity around the upper thigh. The operative limb was then prepped and draped in the usual sterile fashion and preoperative antibiotics were administered.  A time out was performed prior to the start of surgery confirming the correct extremity, preoperative antibiotic administration, as well as team members, implants and instruments available for the case. Correct surgical site was also confirmed with preoperative radiographs. The limb was then elevated for exsanguination and the tourniquet was inflated. A midline incision was made and a standard medial  parapatellar approach was performed.  Small quad snip was performed.  The infrapatellar fat pad was removed.  Suprapatellar synovium was removed to reveal the anterior distal femoral cortex.  A medial peel was performed to release the capsule of the medial tibial plateau.  The patella was then everted and was prepared and sized to a 35 mm.  A cover was placed on the patella for protection from retractors.  The knee was then brought into full flexion and we then turned our attention to the femur.  The cruciates were sacrificed.  Start site was drilled in the femur and the intramedullary distal femoral cutting guide was placed, set at 5 degrees valgus, taking 10 mm of distal resection. The distal cut was made. Osteophytes were then removed.  Next, the proximal tibial cutting guide was placed with appropriate slope, varus/valgus alignment and depth of resection. The proximal tibial cut was made. Gap blocks were then used to assess the extension gap and alignment, and appropriate soft tissue releases were performed. Attention was turned back to the femur, which was sized using the sizing guide to a size 11. Appropriate rotation of the femoral component was determined using epicondylar axis, Whiteside's line, and assessing the flexion gap under ligament tension. The appropriate size 4-in-1 cutting block was placed and checked with an angel wing and cuts were made. Posterior femoral osteophytes and uncapped bone were then removed with the curved osteotome.  Trial components were placed, and stability was checked in full extension, mid-flexion, and deep flexion. Proper tibial rotation was determined and marked.  The patella tracked well without a lateral release.  The femoral lugs were then drilled. Trial components were then removed and tibial preparation performed.  The tibia was sized for a size  G component.   The bony surfaces were irrigated with a pulse lavage and then dried. Bone cement was vacuum mixed on the  back table, and the final components sized above were cemented into place.  Antibiotic irrigation was placed in the knee joint and soft tissues while the cement cured.  After cement had finished curing, excess cement was removed. The stability of the construct was re-evaluated throughout a range of motion and found to be acceptable. The trial liner was removed, the knee was copiously irrigated, and the knee was re-evaluated for any excess bone debris. The real polyethylene liner, 12 mm thick, was inserted and checked to ensure the locking mechanism had engaged appropriately. The tourniquet was deflated and hemostasis was achieved. The wound was irrigated with normal saline.  One gram of vancomycin powder was placed in the surgical bed.  Topical 0.25% bupivacaine and meloxicam was placed in the joint for postoperative pain.  Capsular closure was performed with a #1 vicryl, subcutaneous fat closed with a 0 vicryl suture, then subcutaneous tissue closed with interrupted 2.0 vicryl suture. The skin was then closed with a 2.0 nylon and dermabond. A sterile dressing was applied.  The patient was awakened in the operating room and taken to recovery in stable condition. All sponge, needle, and instrument counts were correct at the end of the case.  Robert Rhodes was necessary for opening, closing, retracting, limb positioning and overall facilitation and completion of the surgery.  Position: supine  Complications: none.  Time Out: performed   Drains/Packing: none  Estimated blood loss: minimal  Returned to Recovery Room: in good condition.   Antibiotics: yes   Mechanical VTE (DVT) Prophylaxis: sequential compression devices, TED thigh-high  Chemical VTE (DVT) Prophylaxis: xarelto  Fluid Replacement  Crystalloid: see anesthesia record Blood: none  FFP: none   Specimens Removed: 1 to pathology   Sponge and Instrument Count Correct? yes   PACU: portable radiograph - knee AP and Lateral    Plan/RTC: Return in 2 weeks for wound check.   Weight Bearing/Load Lower Extremity: full   Implant Name Type Inv. Item Serial No. Manufacturer Lot No. LRB No. Used Action  CEMENT BONE REFOBACIN [id number] [place] - [id number] Cement CEMENT BONE REFOBACIN [id number] [place]  ZIMMER RECON(ORTH,TRAU,BIO,SG) [id number] Right 2 Implanted  TIBIA STEM 5 DEG SZ G R KNEE - [id number] Knees TIBIA STEM 5 DEG SZ G R KNEE  ZIMMER RECON(ORTH,TRAU,BIO,SG) [id number] Right 1 Implanted  INSERT TIB ASF PS 8-11 GH RT - [id number] Insert INSERT TIB ASF PS 8-11 GH RT  ZIMMER RECON(ORTH,TRAU,BIO,SG) [id number] Right 1 Implanted  STEM POLY PAT PLY 48M KNEE - [id number] Knees STEM POLY PAT PLY 48M KNEE  ZIMMER RECON(ORTH,TRAU,BIO,SG) [id number] Right 1 Implanted  COMP FEM PERSONA RT SZ11 - [id number] Knees COMP FEM PERSONA RT SZ11  ZIMMER RECON(ORTH,TRAU,BIO,SG) [id number] Right 1 Implanted    N. Eduard Roux, MD [facility] 9:16 AM

## 2021-11-11 NOTE — Plan of Care (Signed)

## 2021-11-11 NOTE — Anesthesia Procedure Notes (Signed)
Spinal  Patient location during procedure: OR Start time: 11/11/2021 7:34 AM End time: 11/11/2021 7:38 AM Reason for block: surgical anesthesia Staffing Performed: anesthesiologist  Anesthesiologist: Josephine Igo, MD Preanesthetic Checklist Completed: patient identified, IV checked, site marked, risks and benefits discussed, surgical consent, monitors and equipment checked, pre-op evaluation and timeout performed Spinal Block Patient position: sitting Prep: DuraPrep and site prepped and draped Patient monitoring: heart rate, cardiac monitor, continuous pulse ox and blood pressure Approach: midline Location: L4-5 Injection technique: single-shot Needle Needle type: Pencan  Needle gauge: 24 G Needle length: 9 cm Needle insertion depth: 7 cm Assessment Sensory level: T4 Events: CSF return Additional Notes Patient tolerated procedure well. Adequate sensory level. Technically difficult due to positioning and arthritis. Attempt x 2.

## 2021-11-11 NOTE — Anesthesia Postprocedure Evaluation (Signed)
Anesthesia Post Note  Patient: Robert Rhodes  Procedure(s) Performed: RIGHT TOTAL KNEE ARTHROPLASTY (Right: Knee)     Patient location during evaluation: PACU Anesthesia Type: Spinal Level of consciousness: oriented and awake and alert Pain management: pain level controlled Vital Signs Assessment: post-procedure vital signs reviewed and stable Respiratory status: spontaneous breathing, respiratory function stable and nonlabored ventilation Cardiovascular status: blood pressure returned to baseline and stable Postop Assessment: no headache, no backache, no apparent nausea or vomiting, spinal receding and patient able to bend at knees Anesthetic complications: no   No notable events documented.  Last Vitals:  Vitals:   11/11/21 1055 11/11/21 1110  BP: (!) 157/90 (!) 155/68  Pulse: 75 67  Resp: 18 13  Temp:    SpO2: 100% 100%    Last Pain:  Vitals:   11/11/21 1110  TempSrc:   PainSc: 0-No pain                 Yoshua Geisinger A.

## 2021-11-12 ENCOUNTER — Encounter (HOSPITAL_COMMUNITY): Payer: Self-pay | Admitting: Orthopaedic Surgery

## 2021-11-12 ENCOUNTER — Other Ambulatory Visit: Payer: Self-pay | Admitting: Physician Assistant

## 2021-11-12 DIAGNOSIS — Z85038 Personal history of other malignant neoplasm of large intestine: Secondary | ICD-10-CM | POA: Diagnosis not present

## 2021-11-12 DIAGNOSIS — M1711 Unilateral primary osteoarthritis, right knee: Secondary | ICD-10-CM | POA: Diagnosis not present

## 2021-11-12 DIAGNOSIS — D62 Acute posthemorrhagic anemia: Secondary | ICD-10-CM | POA: Diagnosis not present

## 2021-11-12 DIAGNOSIS — Z8249 Family history of ischemic heart disease and other diseases of the circulatory system: Secondary | ICD-10-CM | POA: Diagnosis not present

## 2021-11-12 DIAGNOSIS — Z8616 Personal history of COVID-19: Secondary | ICD-10-CM | POA: Diagnosis not present

## 2021-11-12 DIAGNOSIS — M25561 Pain in right knee: Secondary | ICD-10-CM | POA: Diagnosis not present

## 2021-11-12 DIAGNOSIS — I10 Essential (primary) hypertension: Secondary | ICD-10-CM | POA: Diagnosis not present

## 2021-11-12 DIAGNOSIS — Z79899 Other long term (current) drug therapy: Secondary | ICD-10-CM | POA: Diagnosis not present

## 2021-11-12 DIAGNOSIS — Z87891 Personal history of nicotine dependence: Secondary | ICD-10-CM | POA: Diagnosis not present

## 2021-11-12 LAB — CBC
HCT: 36.1 % — ABNORMAL LOW (ref 39.0–52.0)
Hemoglobin: 12 g/dL — ABNORMAL LOW (ref 13.0–17.0)
MCH: 27.9 pg (ref 26.0–34.0)
MCHC: 33.2 g/dL (ref 30.0–36.0)
MCV: 84 fL (ref 80.0–100.0)
Platelets: 328 10*3/uL (ref 150–400)
RBC: 4.3 MIL/uL (ref 4.22–5.81)
RDW: 14.4 % (ref 11.5–15.5)
WBC: 10.4 10*3/uL (ref 4.0–10.5)
nRBC: 0 % (ref 0.0–0.2)

## 2021-11-12 MED ORDER — ONDANSETRON HCL 4 MG PO TABS: 4.0000 mg | ORAL_TABLET | Freq: Three times a day (TID) | ORAL | 0 refills | Status: DC | PRN

## 2021-11-12 NOTE — Progress Notes (Signed)
Patient stated understanding of his discharge instructions.

## 2021-11-12 NOTE — Discharge Summary (Signed)
Patient ID: Robert Rhodes MRN: [id number] DOB/AGE: June 10, 1946 76 y.o.  Admit date: 11/11/2021 Discharge date: 11/12/2021  Admission Diagnoses:  Principal Problem:   Primary osteoarthritis of right knee Active Problems:   Status post total right knee replacement   Discharge Diagnoses:  Same  Past Medical History:  Diagnosis Date   Adenomatous colon polyp    Arthritis    bilateral knees-needs replacements   Claustrophobia    Colon cancer ([place]) 2015   COVID-19    Eye abnormalities    right eye seen in ED 02/17/2014 wearing eye patch using eye drops   GERD (gastroesophageal reflux disease)    with certain foods   Glaucoma    not on eye drops at this time (10/21/2020)   Hypertension    on meds   Irregular heartbeat    per pt/doesn't know what it does   Osteoarthritis    bilat knees  needs replacement   Osteoporosis    pt denies   Substance abuse ([place])    stopped over 3 years ago   UC (ulcerative colitis) ([place])    Vitamin D deficiency     Surgeries: Procedure(s): RIGHT TOTAL KNEE ARTHROPLASTY on 11/11/2021   Consultants:   Discharged Condition: Improved  Hospital Course: ERASMUS BISTLINE is an 76 y.o. male who was admitted 11/11/2021 for operative treatment ofPrimary osteoarthritis of right knee. Patient has severe unremitting pain that affects sleep, daily activities, and work/hobbies. After pre-op clearance the patient was taken to the operating room on 11/11/2021 and underwent  Procedure(s): RIGHT TOTAL KNEE ARTHROPLASTY.    Patient was given perioperative antibiotics:  Anti-infectives (From admission, onward)    Start     Dose/Rate Route Frequency Ordered Stop   11/11/21 1300  ceFAZolin (ANCEF) IVPB 2g/100 mL premix        2 g 200 mL/hr over 30 Minutes Intravenous Every 6 hours 11/11/21 1249 11/12/21 0059   11/11/21 0824  vancomycin (VANCOCIN) powder  Status:  Discontinued          As needed 11/11/21 0824 11/11/21 0951   11/11/21 0600  ceFAZolin (ANCEF) IVPB 2g/100  mL premix        2 g 200 mL/hr over 30 Minutes Intravenous On call to O.R. 11/11/21 0535 11/11/21 0736   11/11/21 0000  sulfamethoxazole-trimethoprim (BACTRIM DS) 800-160 MG tablet        1 tablet Oral 2 times daily 11/11/21 0920          Patient was given sequential compression devices, early ambulation, and chemoprophylaxis to prevent DVT.  Patient benefited maximally from hospital stay and there were no complications.    Recent vital signs: Patient Vitals for the past 24 hrs:  BP Temp Temp src Pulse Resp SpO2  11/11/21 1935 (!) 148/73 99.1 F (37.3 C) Oral 89 (!) 23 100 %  11/11/21 1558 (!) 142/68 98.6 F (37 C) Oral 80 15 100 %  11/11/21 1325 (!) 174/89 -- -- 78 14 99 %  11/11/21 1240 (!) 143/64 -- -- 65 17 99 %  11/11/21 1225 (!) 145/73 -- -- 75 20 99 %  11/11/21 1210 (!) 157/90 -- -- 91 (!) 25 99 %  11/11/21 1155 (!) 144/85 -- -- (!) 57 12 100 %  11/11/21 1140 (!) 155/82 -- -- 62 14 100 %  11/11/21 1125 (!) 155/74 -- -- 65 12 100 %  11/11/21 1110 (!) 155/68 -- -- 67 13 100 %  11/11/21 1055 (!) 157/90 -- -- 75 18 100 %  11/11/21 1040 (!) 153/79 -- -- (!) 57 12 100 %  11/11/21 1025 137/85 -- -- 68 12 100 %  11/11/21 1010 (!) 126/91 -- -- 70 19 100 %  11/11/21 0955 123/68 (!) 97.3 F (36.3 C) -- 66 18 100 %     Recent laboratory studies:  Recent Labs    11/12/21 0256  WBC 10.4  HGB 12.0*  HCT 36.1*  PLT 328     Discharge Medications:   Allergies as of 11/12/2021       Reactions   Ace Inhibitors Other (See Comments)   Unknown reaction- "affecting my breathing in some kind of way"   Advil [ibuprofen] Other (See Comments)   Muscle tightness   Aspirin Nausea And Vomiting   Tylenol [acetaminophen] Other (See Comments)   Muscle tightness        Medication List     TAKE these medications    amLODipine 5 MG tablet Commonly known as: NORVASC TAKE 1 TABLET BY MOUTH ONCE DAILY- HYPERTENSION   atorvastatin 20 MG tablet Commonly known as: LIPITOR Take 1  tablet (20 mg total) by mouth daily.   docusate sodium 100 MG capsule Commonly known as: Colace Take 1 capsule (100 mg total) by mouth daily as needed.   FIBER ADULT GUMMIES PO Take 2 tablets by mouth daily.   magnesium oxide 400 MG tablet Commonly known as: MAG-OX Take 400 mg by mouth daily.   methocarbamol 750 MG tablet Commonly known as: Robaxin-750 Take 1 tablet (750 mg total) by mouth 2 (two) times daily as needed for muscle spasms.   omeprazole 20 MG tablet Commonly known as: PRILOSEC OTC Take 20 mg by mouth daily as needed (acid reflux).   ondansetron 4 MG tablet Commonly known as: Zofran Take 1 tablet (4 mg total) by mouth every 8 (eight) hours as needed for nausea or vomiting.   oxyCODONE 5 MG immediate release tablet Commonly known as: Roxicodone Take 1-2 tabs po every 8 hours prn pain.  To be taken after surgery   Potassium 99 MG Tabs Take 99 mg by mouth daily.   rivaroxaban 10 MG Tabs tablet Commonly known as: XARELTO Take 1 tablet (10 mg total) by mouth daily. To be taken after surgery to prevent blood clots   sulfamethoxazole-trimethoprim 800-160 MG tablet Commonly known as: BACTRIM DS Take 1 tablet by mouth 2 (two) times daily.               Durable Medical Equipment  (From admission, onward)           Start     Ordered   11/11/21 1633  DME Walker rolling  Once       Question Answer Comment  Walker: With 5 Inch Wheels   Patient needs a walker to treat with the following condition Status post left partial knee replacement      11/11/21 1632   11/11/21 1633  DME 3 n 1  Once        11/11/21 1632   11/11/21 1633  DME Bedside commode  Once       Question:  Patient needs a bedside commode to treat with the following condition  Answer:  Status post left partial knee replacement   11/11/21 1632            Diagnostic Studies: DG Knee Right Port  Result Date: 11/11/2021 CLINICAL DATA:  Status post total right knee arthroplasty. EXAM:  PORTABLE RIGHT KNEE - 1-2 VIEW COMPARISON:  Right knee  radiographs 09/29/2021 FINDINGS: Interval total right knee arthroplasty. No perihardware lucency is seen to indicate hardware failure or loosening. Expected postoperative changes including small joint effusion anterior soft tissue swelling, and subcutaneous air. No acute fracture or dislocation. IMPRESSION: Interval total right knee arthroplasty without evidence of hardware failure. Electronically Signed   By: Yvonne Kendall M.D.   On: 11/11/2021 10:21   ECHOCARDIOGRAM COMPLETE  Result Date: 10/14/2021    ECHOCARDIOGRAM REPORT   Patient Name:   EULON ALLNUTT Date of Exam: 10/14/2021 Medical Rec #:  [id number]        Height:       69.0 in Accession #:    [id number]       Weight:       217.0 lb Date of Birth:  May 10, 1946        BSA:          2.139 m Patient Age:    89 years         BP:           125/79 mmHg Patient Gender: M                HR:           93 bpm. Exam Location:  [place] Procedure: 2D Echo, 3D Echo, Cardiac Doppler and Color Doppler Indications:    Z01.810 Pre-Op clearance  History:        Patient has prior history of Echocardiogram examinations.                 Abnormal ECG, Colon cancer; Risk Factors:Hypertension. Former                 substance abuse.  Sonographer:    Marygrace Drought RCS Referring Phys: [id number] [place]  1. Left ventricular ejection fraction, by estimation, is 55 to 60%. Left ventricular ejection fraction by 3D volume is 59 %. The left ventricle has normal function. The left ventricle has no regional wall motion abnormalities. Left ventricular diastolic  parameters are consistent with Grade I diastolic dysfunction (impaired relaxation).  2. Right ventricular systolic function is normal. The right ventricular size is normal. There is normal pulmonary artery systolic pressure.  3. The mitral valve is normal in structure. No evidence of mitral valve regurgitation. No evidence of mitral stenosis.  4.  The aortic valve is normal in structure. Aortic valve regurgitation is not visualized. No aortic stenosis is present.  5. The inferior vena cava is normal in size with greater than 50% respiratory variability, suggesting right atrial pressure of 3 mmHg. FINDINGS  Left Ventricle: Left ventricular ejection fraction, by estimation, is 55 to 60%. Left ventricular ejection fraction by 3D volume is 59 %. The left ventricle has normal function. The left ventricle has no regional wall motion abnormalities. The left ventricular internal cavity size was normal in size. There is no left ventricular hypertrophy. Left ventricular diastolic parameters are consistent with Grade I diastolic dysfunction (impaired relaxation). Normal left ventricular filling pressure. Right Ventricle: The right ventricular size is normal. No increase in right ventricular wall thickness. Right ventricular systolic function is normal. There is normal pulmonary artery systolic pressure. The tricuspid regurgitant velocity is 2.23 m/s, and  with an assumed right atrial pressure of 3 mmHg, the estimated right ventricular systolic pressure is 30.8 mmHg. Left Atrium: Left atrial size was normal in size. Right Atrium: Right atrial size was normal in size. Pericardium: There is no evidence of pericardial effusion. Mitral Valve:  The mitral valve is normal in structure. Mild mitral annular calcification. No evidence of mitral valve regurgitation. No evidence of mitral valve stenosis. Tricuspid Valve: The tricuspid valve is normal in structure. Tricuspid valve regurgitation is trivial. No evidence of tricuspid stenosis. Aortic Valve: The aortic valve is normal in structure. Aortic valve regurgitation is not visualized. No aortic stenosis is present. Pulmonic Valve: The pulmonic valve was normal in structure. Pulmonic valve regurgitation is mild. No evidence of pulmonic stenosis. Aorta: The aortic root is normal in size and structure. Venous: The inferior vena  cava is normal in size with greater than 50% respiratory variability, suggesting right atrial pressure of 3 mmHg. IAS/Shunts: No atrial level shunt detected by color flow Doppler.  LEFT VENTRICLE PLAX 2D LVIDd:         3.30 cm         Diastology LVIDs:         2.40 cm         LV e' medial:    6.74 cm/s LV PW:         0.90 cm         LV E/e' medial:  11.0 LV IVS:        1.00 cm         LV e' lateral:   7.07 cm/s LVOT diam:     2.40 cm         LV E/e' lateral: 10.5 LV SV:         85 LV SV Index:   40 LVOT Area:     4.52 cm        3D Volume EF                                LV 3D EF:    Left                                             ventricul                                             ar                                             ejection                                             fraction                                             by 3D                                             volume is  59 %.                                 3D Volume EF:                                3D EF:        59 %                                LV EDV:       88 ml                                LV ESV:       36 ml                                LV SV:        52 ml RIGHT VENTRICLE RV Basal diam:  3.50 cm RV S prime:     14.80 cm/s RVSP:           22.9 mmHg LEFT ATRIUM             Index        RIGHT ATRIUM           Index LA diam:        3.40 cm 1.59 cm/m   RA Pressure: 3.00 mmHg LA Vol (A2C):   44.3 ml 20.71 ml/m  RA Area:     14.70 cm LA Vol (A4C):   35.7 ml 16.69 ml/m  RA Volume:   34.80 ml  16.27 ml/m LA Biplane Vol: 40.0 ml 18.70 ml/m  AORTIC VALVE LVOT Vmax:   79.00 cm/s LVOT Vmean:  54.600 cm/s LVOT VTI:    0.187 m  AORTA Ao Root diam: 3.30 cm Ao Asc diam:  3.10 cm MITRAL VALVE               TRICUSPID VALVE MV Area (PHT):             TR Peak grad:   19.9 mmHg MV Decel Time:             TR Vmax:        223.00 cm/s MV E velocity: 74.40 cm/s  Estimated RAP:  3.00 mmHg MV A velocity:  84.40 cm/s  RVSP:           22.9 mmHg MV E/A ratio:  0.88                            SHUNTS                            Systemic VTI:  0.19 m                            Systemic Diam: 2.40 cm Fransico Him MD Electronically signed by Fransico Him MD Signature Date/Time: 10/14/2021/3:35:30 PM    Final     Disposition: Discharge disposition: 01-Home or Self Care          Follow-up Information  Leandrew Koyanagi, MD. Schedule an appointment as soon as possible for a visit in 2 week(s).   Specialty: Orthopedic Surgery Contact information: [address] [place] [address] [phone]                  Signed: Aundra Dubin 11/12/2021, 7:57 AM

## 2021-11-12 NOTE — Progress Notes (Signed)
Pt discharge summary packet  provided to pt with instructions. Pt verbalized understanding of instructions. No complaints. Pt d/c to home with home health as ordered. Pt remains alert/oriented in no apparent distress. DME 3in 1 delivered to bedside.  Per pt his brother is responsible for his ride.

## 2021-11-12 NOTE — Progress Notes (Signed)
Physical Therapy Treatment Patient Details Name: Robert Rhodes MRN: [id number] DOB: 01/18/1946 Today's Date: 11/12/2021   History of Present Illness Pt is a 76 y/o male s/p R TKA. PMH includes colon cancer, glaucoma, HTN, COVID, and substance abuse.    PT Comments    Pt sitting EOB on arrival. Reports ability to independently get RLE in/out of bed. Educated on need to keep RLE elevated when sitting for extended time. Pt required min guard assist transfers, and min guard assist ambulation 150' with RW. Pt performed LE exercises in recliner with feet elevated. HEP handouts provided. Ice pack placed R knee.   Recommendations for follow up therapy are one component of a multi-disciplinary discharge planning process, led by the attending physician.  Recommendations may be updated based on patient status, additional functional criteria and insurance authorization.  Follow Up Recommendations  Follow physician's recommendations for discharge plan and follow up therapies     Assistance Recommended at Discharge Intermittent Supervision/Assistance  Patient can return home with the following Help with stairs or ramp for entrance;Assist for transportation;Assistance with cooking/housework   Equipment Recommendations  BSC/3in1    Recommendations for Other Services       Precautions / Restrictions Precautions Precautions: Knee Precaution Comments: reviewed no pillow under knee, and keep LE elevated when sitting for extended period of time Restrictions Weight Bearing Restrictions: Yes RLE Weight Bearing: Weight bearing as tolerated     Mobility  Bed Mobility Overal bed mobility: Modified Independent                  Transfers Overall transfer level: Needs assistance Equipment used: Rolling walker (2 wheels) Transfers: Sit to/from Stand Sit to Stand: Min guard           General transfer comment: increased time to power up, good technique     Ambulation/Gait Ambulation/Gait assistance: Min guard Gait Distance (Feet): 150 Feet Assistive device: Rolling walker (2 wheels) Gait Pattern/deviations: Step-through pattern, Decreased weight shift to right, Antalgic Gait velocity: decreased Gait velocity interpretation: 1.31 - 2.62 ft/sec, indicative of limited community ambulator   General Gait Details: cues for posture and proximity to [employer]    Modified Rankin (Stroke Patients Only)       Balance Overall balance assessment: Needs assistance Sitting-balance support: No upper extremity supported, Feet supported Sitting balance-Leahy Scale: Good     Standing balance support: Bilateral upper extremity supported, During functional activity Standing balance-Leahy Scale: Fair Standing balance comment: static stand without UE support                            Cognition Arousal/Alertness: Awake/alert Behavior During Therapy: WFL for tasks assessed/performed Overall Cognitive Status: No family/caregiver present to determine baseline cognitive functioning                                 General Comments: poor medical literacy, decreased forward thinking/planning        Exercises Total Joint Exercises Ankle Circles/Pumps: AROM, Both, 10 reps Quad Sets: AROM, Right, 10 reps Short Arc Quad: AROM, Right, 5 reps Heel Slides: AROM, Right, 5 reps Hip ABduction/ADduction: AROM, Right, 5 reps Straight Leg Raises: AROM, Right, 5 reps Goniometric ROM: 0-100 R knee    General Comments General comments (skin integrity, edema, etc.): VSS on RA  Pertinent Vitals/Pain Pain Assessment Pain Assessment: Faces Faces Pain Scale: Hurts a little bit Pain Location: R knee Pain Descriptors / Indicators: Operative site guarding Pain Intervention(s): Monitored during session, Repositioned, Ice applied    Home Living                          Prior  Function            PT Goals (current goals can now be found in the care plan section) Acute Rehab PT Goals Patient Stated Goal: home Progress towards PT goals: Progressing toward goals    Frequency    7X/week      PT Plan Current plan remains appropriate    Co-evaluation              AM-PAC PT "6 Clicks" Mobility   Outcome Measure  Help needed turning from your back to your side while in a flat bed without using bedrails?: None Help needed moving from lying on your back to sitting on the side of a flat bed without using bedrails?: None Help needed moving to and from a bed to a chair (including a wheelchair)?: A Little Help needed standing up from a chair using your arms (e.g., wheelchair or bedside chair)?: A Little Help needed to walk in hospital room?: A Little Help needed climbing 3-5 steps with a railing? : A Little 6 Click Score: 20    End of Session Equipment Utilized During Treatment: Gait belt Activity Tolerance: Patient tolerated treatment well Patient left: in chair;with call bell/phone within reach Nurse Communication: Mobility status PT Visit Diagnosis: Other abnormalities of gait and mobility (R26.89);Pain Pain - Right/Left: Right Pain - part of body: Knee     Time: 0801-0827 PT Time Calculation (min) (ACUTE ONLY): 26 min  Charges:  $Gait Training: 8-22 mins $Therapeutic Exercise: 8-22 mins                     Lorrin Goodell, PT  Office # [phone] Pager [phone]    Lorriane Shire 11/12/2021, 8:50 AM

## 2021-11-12 NOTE — Progress Notes (Signed)
Subjective: 1 Day Post-Op Procedure(s) (LRB): RIGHT TOTAL KNEE ARTHROPLASTY (Right) Patient reports pain as mild.  Feeling great and eager to go home today.  Objective: Vital signs in last 24 hours: Temp:  [97.3 F (36.3 C)-99.1 F (37.3 C)] 99.1 F (37.3 C) (06/01 1935) Pulse Rate:  [57-91] 89 (06/01 1935) Resp:  [12-25] 23 (06/01 1935) BP: (123-174)/(64-91) 148/73 (06/01 1935) SpO2:  [99 %-100 %] 100 % (06/01 1935)  Intake/Output from previous day: 06/01 0701 - 06/02 0700 In: 1540 [P.O.:240; I.V.:1200; IV Piggyback:100] Out: 1250 [Urine:1175; Blood:75] Intake/Output this shift: No intake/output data recorded.  Recent Labs    11/12/21 0256  HGB 12.0*   Recent Labs    11/12/21 0256  WBC 10.4  RBC 4.30  HCT 36.1*  PLT 328   No results for input(s): NA, K, CL, CO2, BUN, CREATININE, GLUCOSE, CALCIUM in the last 72 hours. No results for input(s): LABPT, INR in the last 72 hours.  Neurologically intact Neurovascular intact Sensation intact distally Intact pulses distally Dorsiflexion/Plantar flexion intact Incision: dressing C/D/I No cellulitis present Compartment soft   Assessment/Plan: 1 Day Post-Op Procedure(s) (LRB): RIGHT TOTAL KNEE ARTHROPLASTY (Right) Advance diet Up with therapy D/C IV fluids Discharge home with home health WBAT RLE ABLA- mild and stable   Anticipated LOS equal to or greater than 2 midnights due to - Age 18 and older with one or more of the following:  - Obesity  - Expected need for hospital services (PT, OT, Nursing) required for safe  discharge  - Anticipated need for postoperative skilled nursing care or inpatient rehab  - Active co-morbidities: None OR   - Unanticipated findings during/Post Surgery: None  - Patient is a high risk of re-admission due to: Barriers to post-acute care (logistical, no family support in home)   Aundra Dubin 11/12/2021, 7:38 AM

## 2021-11-12 NOTE — Evaluation (Signed)
Occupational Therapy Evaluation/Discharge Patient Details Name: Robert Rhodes MRN: [id number] DOB: July 27, 1945 Today's Date: 11/12/2021   History of Present Illness Pt is a 76 y/o male s/p R TKA. PMH includes colon cancer, glaucoma, HTN, COVID, and substance abuse.   Clinical Impression   PTA, pt lives in an apartment, typically Modified Independent with ADLs, basic IADLs and mobility. Pt presents now with pain controlled and able to mobilize using RW without physical assistance. Pt able to stand for prolonged time at sink for ADLs with no more than Supervision required for UB/LB ADLs. Educated re: safety with tub transfers, where assistance may be needed at DC, and use of BSC over toilet if needed to ease toilet transfer abilities. Anticipate no skilled OT services needed at DC. OT to sign off at acute leve.       Recommendations for follow up therapy are one component of a multi-disciplinary discharge planning process, led by the attending physician.  Recommendations may be updated based on patient status, additional functional criteria and insurance authorization.   Follow Up Recommendations  No OT follow up    Assistance Recommended at Discharge PRN  Patient can return home with the following Assistance with cooking/housework;Assist for transportation    Functional Status Assessment  Patient has had a recent decline in their functional status and demonstrates the ability to make significant improvements in function in a reasonable and predictable amount of time.  Equipment Recommendations  BSC/3in1    Recommendations for Other Services       Precautions / Restrictions Precautions Precautions: Knee Precaution Comments: reviewed no pillow under knee, and keep LE elevated when sitting for extended period of time Restrictions Weight Bearing Restrictions: Yes RLE Weight Bearing: Weight bearing as tolerated      Mobility Bed Mobility               General bed mobility  comments: up in chair    Transfers Overall transfer level: Modified independent Equipment used: Rolling walker (2 wheels) Transfers: Sit to/from Stand Sit to Stand: Modified independent (Device/Increase time)                  Balance Overall balance assessment: Needs assistance Sitting-balance support: No upper extremity supported, Feet supported Sitting balance-Leahy Scale: Good     Standing balance support: Bilateral upper extremity supported, During functional activity Standing balance-Leahy Scale: Fair Standing balance comment: static stand without UE support                           ADL either performed or assessed with clinical judgement   ADL Overall ADL's : Needs assistance/impaired Eating/Feeding: Independent   Grooming: Modified independent;Standing Grooming Details (indicate cue type and reason): various grooming tasks standing at sink > 12 min Upper Body Bathing: Modified independent;Standing   Lower Body Bathing: Supervison/ safety;Sit to/from stand   Upper Body Dressing : Modified independent;Sitting   Lower Body Dressing: Supervision/safety;Sit to/from stand Lower Body Dressing Details (indicate cue type and reason): cued to don affected LE first, increased time but able to complete Toilet Transfer: Supervision/safety;Comfort height toilet;Rolling walker (2 wheels)   Toileting- Clothing Manipulation and Hygiene: Supervision/safety;Sit to/from stand       Functional mobility during ADLs: Supervision/safety;Rolling walker (2 wheels) General ADL Comments: Educated about tub transfers, sponge bathing if not confident with tub transfers yet, family assist for showering tasks prn, use of BSC over toilet to ease transfer abilities     Vision Baseline  Vision/History: 0 No visual deficits Ability to See in Adequate Light: 0 Adequate Patient Visual Report: No change from baseline Vision Assessment?: No apparent visual deficits     Perception      Praxis      Pertinent Vitals/Pain Pain Assessment Pain Assessment: Faces Faces Pain Scale: Hurts a little bit Pain Location: R knee Pain Descriptors / Indicators: Sore Pain Intervention(s): Monitored during session, RN gave pain meds during session     Hand Dominance Right   Extremity/Trunk Assessment Upper Extremity Assessment Upper Extremity Assessment: Overall WFL for tasks assessed   Lower Extremity Assessment Lower Extremity Assessment: Defer to PT evaluation   Cervical / Trunk Assessment Cervical / Trunk Assessment: Normal   Communication Communication Communication: No difficulties   Cognition Arousal/Alertness: Awake/alert Behavior During Therapy: WFL for tasks assessed/performed Overall Cognitive Status: No family/caregiver present to determine baseline cognitive functioning                                 General Comments: poor medical literacy, decreased forward thinking/planning, tangential at times     General Comments  VSS on RA    Exercises     Shoulder Instructions      Home Living Family/patient expects to be discharged to:: Private residence Living Arrangements: Alone Available Help at Discharge: Family;Available 24 hours/day Type of Home: Other(Comment) (senior living) Home Access: Level entry     Home Layout: One level     Bathroom Shower/Tub: Teacher, early years/pre: Standard     Home Equipment: Conservation officer, nature (2 wheels);Shower seat;Grab bars - tub/shower;Grab bars - toilet          Prior Functioning/Environment Prior Level of Function : Independent/Modified Independent             Mobility Comments: Uses cane for ambulation ADLs Comments: able to complete ADLs without assist, basic household IADLs. uses hand cart to transport laundry down the hall        OT Problem List: Pain;Decreased knowledge of use of DME or AE      OT Treatment/Interventions:      OT Goals(Current goals can be  found in the care plan section) Acute Rehab OT Goals Patient Stated Goal: go home today OT Goal Formulation: All assessment and education complete, DC therapy  OT Frequency:      Co-evaluation              AM-PAC OT "6 Clicks" Daily Activity     Outcome Measure Help from another person eating meals?: None Help from another person taking care of personal grooming?: None Help from another person toileting, which includes using toliet, bedpan, or urinal?: A Little Help from another person bathing (including washing, rinsing, drying)?: A Little Help from another person to put on and taking off regular upper body clothing?: None Help from another person to put on and taking off regular lower body clothing?: A Little 6 Click Score: 21   End of Session Equipment Utilized During Treatment: Rolling walker (2 wheels) Nurse Communication: Mobility status  Activity Tolerance: Patient tolerated treatment well Patient left: in chair;with call bell/phone within reach  OT Visit Diagnosis: Other abnormalities of gait and mobility (R26.89);Pain Pain - Right/Left: Right Pain - part of body: Knee                Time: 2409-7353 OT Time Calculation (min): 36 min Charges:  OT General Charges $OT Visit: 1  Visit OT Evaluation $OT Eval Low Complexity: 1 Low OT Treatments $Self Care/Home Management : 8-22 mins  Malachy Chamber, OTR/L Acute Rehab Services Office: [phone]   Layla Maw 11/12/2021, 9:32 AM

## 2021-11-15 ENCOUNTER — Telehealth: Payer: Self-pay

## 2021-11-15 NOTE — Telephone Encounter (Signed)
Transition Care Management Follow-up Telephone Call Date of discharge and from where: 11/12/2021, [facility] How have you been since you were released from the hospital? He said he is feeling a little better Any questions or concerns? No  Items Reviewed: Did the pt receive and understand the discharge instructions provided? Yes  Medications obtained and verified? Yes - he said he has all of his medications and he did not have any questions about the med regime.  Other? No  Any new allergies since your discharge? No  Dietary orders reviewed? Yes Do you have support at home? Yes , his brother   [place] and Equipment/Supplies: Were home health services ordered? yes If so, what is the name of the agency? [facility]  Has the agency set up a time to come to the patient's home? Yes- the PT is coming to see him this afternoon.  Were any new equipment or medical supplies ordered?  Yes: RW, 3:1 commode, ice machine, CPM What is the name of the medical supply agency? They were ordered by orthopedics Were you able to get the supplies/equipment? yes Do you have any questions related to the use of the equipment or supplies? No  Functional Questionnaire: (I = Independent and D = Dependent) ADLs: ambulating with RW, WBAT RLE. Independent with personal care.  He said his brother lives down the hall and can assist him if needed   Follow up appointments reviewed:  PCP Hospital f/u appt confirmed?  He wanted to schedule his AWV - 11/30/2021.   Routine follow up with Dr Margarita Rana - 03/21/2022.  [facility] f/u appt confirmed? Yes  Scheduled to see orthopedics- 11/26/2021.  Are transportation arrangements needed? No - his brother drives or he uses [employer] transportation.  I also informed him that Medicaid provides rides to medical appointments.  If their condition worsens, is the pt aware to call PCP or go to the Emergency Dept.? Yes Was the patient provided with contact information for the PCP's  office or ED? Yes Was to pt encouraged to call back with questions or concerns? Yes

## 2021-11-15 NOTE — Telephone Encounter (Signed)
From the discharge call:   He said he is feeling a little better. He has all of his medications and did not have any questions or concerns  ambulating with RW, WBAT RLE. Independent with personal care.  He said his brother lives down the hall and can assist him if needed. PT is scheduled to see him at home today.   He wanted to schedule his AWV - 11/30/2021.   Routine follow up with Dr Margarita Rana - 03/21/2022.   Scheduled to see orthopedics- 11/26/2021.

## 2021-11-17 ENCOUNTER — Ambulatory Visit: Payer: Self-pay

## 2021-11-17 ENCOUNTER — Emergency Department (HOSPITAL_COMMUNITY)
Admission: EM | Admit: 2021-11-17 | Discharge: 2021-11-17 | Disposition: A | Discharge status: Home / Self Care | Payer: Medicare HMO | Attending: Emergency Medicine | Admitting: Emergency Medicine

## 2021-11-17 ENCOUNTER — Emergency Department (HOSPITAL_BASED_OUTPATIENT_CLINIC_OR_DEPARTMENT_OTHER): Payer: Medicare HMO

## 2021-11-17 DIAGNOSIS — M79604 Pain in right leg: Secondary | ICD-10-CM | POA: Diagnosis not present

## 2021-11-17 DIAGNOSIS — R3129 Other microscopic hematuria: Secondary | ICD-10-CM | POA: Diagnosis not present

## 2021-11-17 DIAGNOSIS — R350 Frequency of micturition: Secondary | ICD-10-CM | POA: Diagnosis present

## 2021-11-17 DIAGNOSIS — M7989 Other specified soft tissue disorders: Secondary | ICD-10-CM

## 2021-11-17 DIAGNOSIS — R311 Benign essential microscopic hematuria: Secondary | ICD-10-CM | POA: Diagnosis not present

## 2021-11-17 DIAGNOSIS — Z7901 Long term (current) use of anticoagulants: Secondary | ICD-10-CM | POA: Insufficient documentation

## 2021-11-17 LAB — COMPREHENSIVE METABOLIC PANEL
ALT: 83 U/L — ABNORMAL HIGH (ref 0–44)
AST: 76 U/L — ABNORMAL HIGH (ref 15–41)
Albumin: 3.1 g/dL — ABNORMAL LOW (ref 3.5–5.0)
Alkaline Phosphatase: 73 U/L (ref 38–126)
Anion gap: 7 (ref 5–15)
BUN: 11 mg/dL (ref 8–23)
CO2: 27 mmol/L (ref 22–32)
Calcium: 9.3 mg/dL (ref 8.9–10.3)
Chloride: 96 mmol/L — ABNORMAL LOW (ref 98–111)
Creatinine, Ser: 1.28 mg/dL — ABNORMAL HIGH (ref 0.61–1.24)
GFR, Estimated: 57 mL/min — ABNORMAL LOW (ref >60)
Glucose, Bld: 106 mg/dL — ABNORMAL HIGH (ref 70–99)
Potassium: 3.6 mmol/L (ref 3.5–5.1)
Sodium: 130 mmol/L — ABNORMAL LOW (ref 135–145)
Total Bilirubin: 1.3 mg/dL — ABNORMAL HIGH (ref 0.3–1.2)
Total Protein: 8.3 g/dL — ABNORMAL HIGH (ref 6.5–8.1)

## 2021-11-17 LAB — CBG MONITORING, ED: Glucose-Capillary: 91 mg/dL (ref 70–99)

## 2021-11-17 LAB — CBC WITH DIFFERENTIAL/PLATELET
Abs Immature Granulocytes: 0.07 10*3/uL (ref 0.00–0.07)
Basophils Absolute: 0 10*3/uL (ref 0.0–0.1)
Basophils Relative: 0 %
Eosinophils Absolute: 0.3 10*3/uL (ref 0.0–0.5)
Eosinophils Relative: 3 %
HCT: 32.9 % — ABNORMAL LOW (ref 39.0–52.0)
Hemoglobin: 10.9 g/dL — ABNORMAL LOW (ref 13.0–17.0)
Immature Granulocytes: 1 %
Lymphocytes Relative: 24 %
Lymphs Abs: 2.6 10*3/uL (ref 0.7–4.0)
MCH: 27.9 pg (ref 26.0–34.0)
MCHC: 33.1 g/dL (ref 30.0–36.0)
MCV: 84.1 fL (ref 80.0–100.0)
Monocytes Absolute: 1.4 10*3/uL — ABNORMAL HIGH (ref 0.1–1.0)
Monocytes Relative: 13 %
Neutro Abs: 6.3 10*3/uL (ref 1.7–7.7)
Neutrophils Relative %: 59 %
Platelets: 448 10*3/uL — ABNORMAL HIGH (ref 150–400)
RBC: 3.91 MIL/uL — ABNORMAL LOW (ref 4.22–5.81)
RDW: 13.8 % (ref 11.5–15.5)
WBC: 10.7 10*3/uL — ABNORMAL HIGH (ref 4.0–10.5)
nRBC: 0 % (ref 0.0–0.2)

## 2021-11-17 LAB — URINALYSIS, ROUTINE W REFLEX MICROSCOPIC
Bilirubin Urine: NEGATIVE
Glucose, UA: NEGATIVE mg/dL
Ketones, ur: NEGATIVE mg/dL
Leukocytes,Ua: NEGATIVE
Nitrite: NEGATIVE
Protein, ur: NEGATIVE mg/dL
Specific Gravity, Urine: 1.008 (ref 1.005–1.030)
pH: 5 (ref 5.0–8.0)

## 2021-11-17 LAB — MAGNESIUM: Magnesium: 2.3 mg/dL (ref 1.7–2.4)

## 2021-11-17 MED ORDER — SODIUM CHLORIDE 0.9 % IV BOLUS
1000.0000 mL | Freq: Once | INTRAVENOUS | Status: AC
  Administered 2021-11-17: 1000 mL via INTRAVENOUS

## 2021-11-17 NOTE — Telephone Encounter (Signed)
  Chief Complaint: cramping all over Symptoms: unable to sleep, fatigue,  Frequency: since knee surgery Pertinent Negatives: Patient denies SOB, chest pain Disposition: [x] ED /[] Urgent Care (no appt availability in office) / [] Appointment(In office/virtual)/ []  [place] [facility]/ [] Home Care/ [] Refused Recommended Disposition /[] [place] [facility]/ []  Follow-up with PCP Additional Notes: pt stated that he had this before and pt stated he wanted to go to [facility] or ED.  Pt stated he went to ED and after IVF sx went away.Called [facility] and was advised for pt to go to ED.       Drinking water mouth drying up- cramping takes molasses.  HQ:PRFFMBW, cannot sleep Cramping everywhere  Dehydrated feels needs IV  Mouth dry using biotene Answer Assessment - Initial Assessment Questions 1. ONSET: "When did the muscle aches or body pains start?"      Cramping for several days 2. LOCATION: "What part of your body is hurting?" (e.g., entire body, arms, legs)      Muscle cramping in arms, hands, legs 3. SEVERITY: "How bad is the pain?" (Scale 1-10; or mild, moderate, severe)   - MILD (1-3): doesn't interfere with normal activities    - MODERATE (4-7): interferes with normal activities or awakens from sleep    - SEVERE (8-10):  excruciating pain, unable to do any normal activities      moderate 4. CAUSE: "What do you think is causing the pains?"     dehydration 5. FEVER: "Have you been having fever?"     no 6. OTHER SYMPTOMS: "Do you have any other symptoms?" (e.g., chest pain, weakness, rash, cold or flu symptoms, weight loss)     Fatigue, unable to sleep, dry mouth , thirsty, frequent urination, cannot sleep  Protocols used: Muscle Aches and Body Pain-A-AH

## 2021-11-17 NOTE — ED Provider Notes (Signed)
[facility] EMERGENCY DEPARTMENT Provider Note   CSN: [id number] Arrival date & time: 11/17/21  1024     History  Chief Complaint  Patient presents with   Dehydration   Nausea   Urinary Frequency    Robert Rhodes is a 76 y.o. male.  76 yo M with a chief complaints of increased urination.  This been going on for a few days since he had had his knee replaced.  He fell like the knee and the leg has been painful and swollen.  He has increased his fluid intake at home because of his increased urinary effort.  He denies any difficulty urinating.   Urinary Frequency       Home Medications Prior to Admission medications   Medication Sig Start Date End Date Taking? Authorizing Provider  docusate sodium (COLACE) 100 MG capsule Take 1 capsule (100 mg total) by mouth daily as needed. 11/05/21 11/05/22  Aundra Dubin, PA-C  methocarbamol (ROBAXIN-750) 750 MG tablet Take 1 tablet (750 mg total) by mouth 2 (two) times daily as needed for muscle spasms. 11/05/21   Aundra Dubin, PA-C  oxyCODONE (ROXICODONE) 5 MG immediate release tablet Take 1-2 tabs po every 8 hours prn pain.  To be taken after surgery 11/05/21   Aundra Dubin, PA-C  rivaroxaban (XARELTO) 10 MG TABS tablet Take 1 tablet (10 mg total) by mouth daily. To be taken after surgery to prevent blood clots 11/05/21   Aundra Dubin, PA-C  amLODipine (NORVASC) 5 MG tablet TAKE 1 TABLET BY MOUTH ONCE DAILY- HYPERTENSION 09/16/21   [name], MD  atorvastatin (LIPITOR) 20 MG tablet Take 1 tablet (20 mg total) by mouth daily. 09/16/21   [name], MD  FIBER ADULT GUMMIES PO Take 2 tablets by mouth daily.    [provider]  magnesium oxide (MAG-OX) 400 MG tablet Take 400 mg by mouth daily.    [provider]  omeprazole (PRILOSEC OTC) 20 MG tablet Take 20 mg by mouth daily as needed (acid reflux).    [provider]  ondansetron (ZOFRAN) 4 MG tablet Take 1 tablet (4 mg total)  by mouth every 8 (eight) hours as needed for nausea or vomiting. 11/12/21   Aundra Dubin, PA-C  Potassium 99 MG TABS Take 99 mg by mouth daily.    [provider]  sulfamethoxazole-trimethoprim (BACTRIM DS) 800-160 MG tablet Take 1 tablet by mouth 2 (two) times daily. 11/11/21   Leandrew Koyanagi, MD      Allergies    Ace inhibitors, Advil [ibuprofen], Aspirin, and Tylenol [acetaminophen]    Review of Systems   Review of Systems  Genitourinary:  Positive for frequency.    Physical Exam Updated Vital Signs BP 138/69   Pulse 82   Temp 98 F (36.7 C) (Oral)   Resp 18   SpO2 100%  Physical Exam Vitals and nursing note reviewed.  Constitutional:      Appearance: He is well-developed.  HENT:     Head: Normocephalic and atraumatic.  Eyes:     Pupils: Pupils are equal, round, and reactive to light.  Neck:     Vascular: No JVD.  Cardiovascular:     Rate and Rhythm: Normal rate and regular rhythm.     Heart sounds: No murmur heard.    No friction rub. No gallop.  Pulmonary:     Effort: No respiratory distress.     Breath sounds: No wheezing.  Abdominal:  General: There is no distension.     Tenderness: There is no abdominal tenderness. There is no guarding or rebound.     Comments: Tenderness with palpation of the bladder.  Musculoskeletal:        General: Swelling and tenderness present. Normal range of motion.     Cervical back: Normal range of motion and neck supple.     Comments: Pain and swelling to the right leg adjacent to the knee.  Bloody drainage from the inferior aspect of the surgical wound.  Wound otherwise is clean dry and intact.  Skin:    Coloration: Skin is not pale.     Findings: No rash.  Neurological:     Mental Status: He is alert and oriented to person, place, and time.  Psychiatric:        Behavior: Behavior normal.     ED Results / Procedures / Treatments   Labs (all labs ordered are listed, but only abnormal results are displayed) Labs  Reviewed  COMPREHENSIVE METABOLIC PANEL - Abnormal; Notable for the following components:      Result Value   Sodium 130 (*)    Chloride 96 (*)    Glucose, Bld 106 (*)    Creatinine, Ser 1.28 (*)    Total Protein 8.3 (*)    Albumin 3.1 (*)    AST 76 (*)    ALT 83 (*)    Total Bilirubin 1.3 (*)    GFR, Estimated 57 (*)    All other components within normal limits  CBC WITH DIFFERENTIAL/PLATELET - Abnormal; Notable for the following components:   WBC 10.7 (*)    RBC 3.91 (*)    Hemoglobin 10.9 (*)    HCT 32.9 (*)    Platelets 448 (*)    Monocytes Absolute 1.4 (*)    All other components within normal limits  URINALYSIS, ROUTINE W REFLEX MICROSCOPIC - Abnormal; Notable for the following components:   Hgb urine dipstick MODERATE (*)    Bacteria, UA RARE (*)    All other components within normal limits  MAGNESIUM  CBG MONITORING, ED    EKG None  Radiology VAS [place] LOWER EXTREMITY VENOUS (DVT)  Result Date: 11/17/2021  Lower Venous DVT Study Patient Name:  Robert Rhodes  Date of Exam:   11/17/2021 Medical Rec #: [id number]         Accession #:    [id number] Date of Birth: 09/25/1945         Patient Gender: M Patient Age:   76 years Exam Location:  [facility] Procedure:      VAS [place] LOWER EXTREMITY VENOUS (DVT) Referring Phys: Keni Elison --------------------------------------------------------------------------------  Indications: Pain, and Swelling.  Risk Factors: Surgery RT TKA 11/11/21. Limitations: Body habitus and poor ultrasound/tissue interface. Comparison Study: No previous exams Performing Technologist: Jody Hill RVT, RDMS  Examination Guidelines: A complete evaluation includes B-mode imaging, spectral Doppler, color Doppler, and power Doppler as needed of all accessible portions of each vessel. Bilateral testing is considered an integral part of a complete examination. Limited examinations for reoccurring indications may be performed as noted. The reflux portion of the exam is  performed with the patient in reverse Trendelenburg.  +---------+---------------+---------+-----------+----------+--------------+ RIGHT    CompressibilityPhasicitySpontaneityPropertiesThrombus Aging +---------+---------------+---------+-----------+----------+--------------+ CFV      Full           Yes      Yes                                 +---------+---------------+---------+-----------+----------+--------------+  SFJ      Full                                                        +---------+---------------+---------+-----------+----------+--------------+ FV Prox  Full           Yes      Yes                                 +---------+---------------+---------+-----------+----------+--------------+ FV Mid   Full           Yes      Yes                                 +---------+---------------+---------+-----------+----------+--------------+ FV DistalFull           Yes      Yes                                 +---------+---------------+---------+-----------+----------+--------------+ PFV      Full                                                        +---------+---------------+---------+-----------+----------+--------------+ POP      Full           Yes      Yes                                 +---------+---------------+---------+-----------+----------+--------------+ PTV      Full                                                        +---------+---------------+---------+-----------+----------+--------------+ PERO     Full                                                        +---------+---------------+---------+-----------+----------+--------------+   +----+---------------+---------+-----------+----------+--------------+ LEFTCompressibilityPhasicitySpontaneityPropertiesThrombus Aging +----+---------------+---------+-----------+----------+--------------+ CFV Full           Yes      Yes                                  +----+---------------+---------+-----------+----------+--------------+    Summary: RIGHT: - There is no evidence of deep vein thrombosis in the lower extremity.  - No cystic structure found in the popliteal fossa. - Ultrasound characteristics of enlarged lymph nodes are noted in the groin.  LEFT: - No evidence of common femoral vein obstruction.  *See table(s) above for measurements and observations. Electronically signed by Harold Barban MD on 11/17/2021 at 10:54:46 PM.  Final     Procedures Procedures    Medications Ordered in ED Medications  sodium chloride 0.9 % bolus 1,000 mL (0 mLs Intravenous Stopped 11/17/21 1552)    ED Course/ Medical Decision Making/ A&P                           Medical Decision Making  76 yo M with a chief complaints of increased urinary frequency.  This is been going on for a few days ever since he had had his knee replaced.  We will obtain a UA to assess for infection.  The patient is mildly dehydrated based on his blood work with a low sodium and chloride level and mild bump in his renal function.  We will give a bolus of IV fluids.  Bladder ultrasound to assess for possible urinary retention.  DVT study of the right lower extremity.  Patient is not retaining urine.  UA with some blood in it but not infected as independently interpreted by me.  DVT study was negative.  I discussed results with the patient.  We will have him follow-up with his orthopedic surgeon.  Given urology follow-up as needed though hematuria could be due to Xarelto use.  We will have him follow-up with his family doctor.  7:06 AM:  I have discussed the diagnosis/risks/treatment options with the patient.  Evaluation and diagnostic testing in the emergency department does not suggest an emergent condition requiring admission or immediate intervention beyond what has been performed at this time.  They will follow up with  PCP. We also discussed returning to the ED immediately if new or worsening sx  occur. We discussed the sx which are most concerning (e.g., sudden worsening pain, fever, inability to tolerate by mouth) that necessitate immediate return. Medications administered to the patient during their visit and any new prescriptions provided to the patient are listed below.  Medications given during this visit Medications  sodium chloride 0.9 % bolus 1,000 mL (0 mLs Intravenous Stopped 11/17/21 1552)     The patient appears reasonably screen and/or stabilized for discharge and I doubt any other medical condition or other [facility] requiring further screening, evaluation, or treatment in the ED at this time prior to discharge.          Final Clinical Impression(s) / ED Diagnoses Final diagnoses:  Benign essential microscopic hematuria    Rx / DC Orders ED Discharge Orders     None         Deno Etienne, DO 11/18/21 4158

## 2021-11-17 NOTE — ED Triage Notes (Signed)
Pt. Stated, I had a knee replacement last Thursday and Im not able to keep anything in, I keep peeing all the time.

## 2021-11-17 NOTE — Progress Notes (Signed)
RLE venous duplex has been completed.  Preliminary results messaged to Dr. Tyrone Nine.  Results can be found under chart review under CV PROC. 11/17/2021 2:35 PM Marley Charlot RVT, RDMS

## 2021-11-17 NOTE — Discharge Instructions (Addendum)
Your kidney function was mildly worse today than it has been.  He drink as well as you can for the next 48 hours.  Please follow-up with your family doctor for recheck.  Let your orthopedic doctor know that you are seen here and that we had evaluated your leg swelling.  Your urine had blood in it.  This could be due to you starting the blood thinner.  Typically if you have blood in your urine we refer you to the urologist to see in the office.  Discussed this with your family doctor.

## 2021-11-17 NOTE — ED Provider Triage Note (Signed)
Emergency Medicine Provider Triage Evaluation Note  Robert Rhodes , a 76 y.o. male  was evaluated in triage.  Pt complains of dry mouth, urinary frequency and full body cramping for the past 2 days.  On Thursday he had knee surgery and says he has not been sleeping well since then.  No history of diabetes.  Currently taking oxycodone daily.  Review of Systems  Positive: As above Negative: Vomiting or diarrhea  Physical Exam  BP (!) 157/78 (BP Location: Right Arm)   Pulse 93   Temp 98 F (36.7 C) (Oral)   Resp 16   SpO2 92%  Gen:   Awake, no distress   Resp:  Normal effort  MSK:   Moves extremities without difficulty  Other:  RRR, lung sounds clear.  Nontender abdomen.  Dry mucous membranes  Medical Decision Making  Medically screening exam initiated at 11:16 AM.  Appropriate orders placed.  Robert Rhodes was informed that the remainder of the evaluation will be completed by another provider, this initial triage assessment does not replace that evaluation, and the importance of remaining in the ED until their evaluation is complete.     Rhae Hammock, PA-C 11/17/21 1118

## 2021-11-19 ENCOUNTER — Encounter: Payer: Self-pay | Admitting: Orthopaedic Surgery

## 2021-11-19 ENCOUNTER — Ambulatory Visit (INDEPENDENT_AMBULATORY_CARE_PROVIDER_SITE_OTHER): Payer: Medicare HMO | Admitting: Orthopaedic Surgery

## 2021-11-19 ENCOUNTER — Telehealth: Payer: Self-pay | Admitting: Orthopaedic Surgery

## 2021-11-19 DIAGNOSIS — Z96651 Presence of right artificial knee joint: Secondary | ICD-10-CM

## 2021-11-19 MED ORDER — SULFAMETHOXAZOLE-TRIMETHOPRIM 800-160 MG PO TABS: 1.0000 | ORAL_TABLET | Freq: Two times a day (BID) | ORAL | 0 refills | Status: DC

## 2021-11-19 NOTE — Progress Notes (Signed)
Post-Op Visit Note   Patient: Robert Rhodes           Date of Birth: [birth year]           MRN: [id number] Visit Date: 11/19/2021 PCP: Robert Rakes, MD   Assessment & Plan:  Chief Complaint:  Chief Complaint  Patient presents with   Right Knee - Routine Post Op    Right TKA 11/11/2021   Visit Diagnoses:  1. Status post total right knee replacement     Plan: Robert Rhodes is 8 days status post right total knee replacement on 11/11/2021.  He went to the ED 2 days ago for saturated surgical bandage which was changed.  He comes in today for recheck.  Surgical incision is intact.  There is dried bloody drainage.  There is no active drainage that I can express from the incision.  No evidence of infection.  He does exhibit expected postoperative swelling and bruising.  No neurovascular compromise.  We replaced Robert Rhodes is bandage with Betadine paint and ABDs and compression wrap was applied.  He will follow-up with Robert Rhodes next week for a recheck of his wound.  I will extend his Bactrim for another 10 days.  Follow-Up Instructions: No follow-ups on file.   Orders:  No orders of the defined types were placed in this encounter.  No orders of the defined types were placed in this encounter.   Imaging: No results found.  PMFS History: Patient Active Problem List   Diagnosis Date Noted   Primary osteoarthritis of right knee 11/11/2021   Status post total right knee replacement 11/11/2021   AKI (acute kidney injury) ([place]) 04/21/2019   Pneumonia due to COVID-19 virus 04/21/2019   Osteoporosis    Ulcerative colitis, with rectal bleeding 08/06/2015   Gastroesophageal reflux disease without esophagitis 08/06/2015   Visual impairment of right eye 06/18/2015   Essential hypertension, benign 06/18/2015   Hernia of abdominal cavity 11/27/2014   Bipolar 1 disorder, mixed, moderate ([place]) 11/27/2014   Cellulitis 08/23/2014   Medically noncompliant 08/23/2014   Left leg cellulitis  08/23/2014   Cellulitis of left leg    Hypokalemia    Diarrhea 05/06/2014   Ulcerative colitis ([place]) 04/17/2014   Verrucous keratosis 04/17/2014   Nonspecific (abnormal) findings on radiological and other examination of gastrointestinal tract 07/18/2013   Acute esophagitis 07/18/2013   Hypovitaminosis D 05/06/2013   Ulcerative colitis, unspecified 04/04/2013   Knee pain, bilateral 03/05/2013   Past Medical History:  Diagnosis Date   Adenomatous colon polyp    Arthritis    bilateral knees-needs replacements   Claustrophobia    Colon cancer ([place]) 2015   COVID-19    Eye abnormalities    right eye seen in ED 02/17/2014 wearing eye patch using eye drops   GERD (gastroesophageal reflux disease)    with certain foods   Glaucoma    not on eye drops at this time (10/21/2020)   Hypertension    on meds   Irregular heartbeat    per pt/doesn't know what it does   Osteoarthritis    bilat knees  needs replacement   Osteoporosis    pt denies   Substance abuse ([place])    stopped over 3 years ago   UC (ulcerative colitis) ([place])    Vitamin D deficiency     Family History  Problem Relation Age of Onset   Cancer Mother        type unknown-in her leg   Diabetes Father  Heart disease Father    Heart disease Sister    Heart attack Brother    Healthy Sister    Colon cancer Neg Hx    Esophageal cancer Neg Hx    Rectal cancer Neg Hx    Stomach cancer Neg Hx    Colon polyps Neg Hx     Past Surgical History:  Procedure Laterality Date   COLON RESECTION N/A 08/23/2013   Procedure: Laparoscopic total abdominal colectomy and hernia repair;  Surgeon: Robert Ruff, MD;  [facility];  Service: General;  Laterality: N/A;   COLON SURGERY  08/2013   COLONOSCOPY  11/02/2020   EUS N/A 07/18/2013   Procedure: UPPER ENDOSCOPIC ULTRASOUND (EUS) RADIAL;  Surgeon: Robert Banister, MD;  [facility];  Service: Endoscopy;  Laterality: N/A;   EYE SURGERY     FLEXIBLE SIGMOIDOSCOPY N/A 2020    prep good   HERNIA REPAIR  2005   In [place] in [place] mesh   INCISIONAL HERNIA REPAIR N/A 02/20/2014   Procedure: LAP ASSISTED INCISIONAL HERNIA REPAIR LYSIS OF ADHESIONS;  Surgeon: Robert Ruff, MD;  [facility];  Service: General;  Laterality: N/A;  converted to open @ [place] N/A 02/20/2014   Procedure: HERNIA REPAIR INCISIONAL;  Surgeon: Robert Ruff, MD;  [facility];  Service: General;  Laterality: N/A;  With MESH   INTRAOCULAR LENS INSERTION Bilateral    6 yrs ago   TOTAL KNEE ARTHROPLASTY Right 11/11/2021   Procedure: RIGHT TOTAL KNEE ARTHROPLASTY;  Surgeon: Robert Koyanagi, MD;  Location: [facility];  Service: Orthopedics;  Laterality: Right;   VENTRAL HERNIA REPAIR     Social History   Occupational History   Occupation: retired  Tobacco Use   Smoking status: Former    Packs/day: 0.00    Types: Cigarettes    Quit date: 06/14/2003    Years since quitting: 18.4   Smokeless tobacco: Never   Tobacco comments:    quit in July 2005  Vaping Use   Vaping Use: Never used  Substance and Sexual Activity   Alcohol use: No   Drug use: Not Currently    Types: 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

## 2021-11-22 ENCOUNTER — Other Ambulatory Visit: Payer: Self-pay | Admitting: Physician Assistant

## 2021-11-22 ENCOUNTER — Telehealth: Payer: Self-pay | Admitting: Physician Assistant

## 2021-11-22 ENCOUNTER — Telehealth: Payer: Self-pay | Admitting: Family Medicine

## 2021-11-22 DIAGNOSIS — M170 Bilateral primary osteoarthritis of knee: Secondary | ICD-10-CM

## 2021-11-22 MED ORDER — OXYCODONE HCL 5 MG PO TABS: ORAL_TABLET | ORAL | 0 refills | Status: DC

## 2021-11-22 NOTE — Telephone Encounter (Signed)
Sent in oxycodone

## 2021-11-22 NOTE — Telephone Encounter (Signed)
Patient called asked if he can get another 2 to 3 days supply of his pain medication (Oxycodone)?  Patient said he tried Aleve but it did not help with the pain. The number to contact patient is [phone] or [phone]

## 2021-11-22 NOTE — Telephone Encounter (Signed)
Copied from [place] [phone]. Topic: Referral - Request for Referral >> Nov 22, 2021  9:09 AM Everette C wrote: Has patient seen PCP for this complaint? No. *If NO, is insurance requiring patient see PCP for this issue before PCP can refer them? Referral for which specialty: [facility]  Preferred provider/office: Patient has no preference  Reason for referral: Patient has had knee surgery and would like in - home assistance >> Nov 22, 2021 10:03 AM Rudene Anda wrote: Pt stated he will need assistance for about a week with changing his bandages and helping him around the house.

## 2021-11-23 ENCOUNTER — Telehealth: Payer: Self-pay | Admitting: Orthopaedic Surgery

## 2021-11-23 ENCOUNTER — Other Ambulatory Visit: Payer: Self-pay

## 2021-11-23 ENCOUNTER — Ambulatory Visit: Payer: Self-pay | Admitting: *Deleted

## 2021-11-23 ENCOUNTER — Encounter (HOSPITAL_COMMUNITY): Payer: Self-pay | Admitting: Emergency Medicine

## 2021-11-23 ENCOUNTER — Emergency Department (HOSPITAL_COMMUNITY)
Admission: EM | Admit: 2021-11-23 | Discharge: 2021-11-23 | Discharge status: Against Medical Advice | Payer: Medicare HMO | Attending: Emergency Medicine | Admitting: Emergency Medicine

## 2021-11-23 DIAGNOSIS — R1084 Generalized abdominal pain: Secondary | ICD-10-CM | POA: Insufficient documentation

## 2021-11-23 DIAGNOSIS — Z96651 Presence of right artificial knee joint: Secondary | ICD-10-CM | POA: Insufficient documentation

## 2021-11-23 DIAGNOSIS — Z5321 Procedure and treatment not carried out due to patient leaving prior to being seen by health care provider: Secondary | ICD-10-CM | POA: Diagnosis not present

## 2021-11-23 DIAGNOSIS — M791 Myalgia, unspecified site: Secondary | ICD-10-CM | POA: Diagnosis not present

## 2021-11-23 LAB — BASIC METABOLIC PANEL
Anion gap: 10 (ref 5–15)
BUN: 9 mg/dL (ref 8–23)
CO2: 24 mmol/L (ref 22–32)
Calcium: 9.2 mg/dL (ref 8.9–10.3)
Chloride: 95 mmol/L — ABNORMAL LOW (ref 98–111)
Creatinine, Ser: 1.21 mg/dL (ref 0.61–1.24)
GFR, Estimated: >60 mL/min (ref >60)
Glucose, Bld: 92 mg/dL (ref 70–99)
Potassium: 4.5 mmol/L (ref 3.5–5.1)
Sodium: 129 mmol/L — ABNORMAL LOW (ref 135–145)

## 2021-11-23 LAB — URINALYSIS, ROUTINE W REFLEX MICROSCOPIC
Bacteria, UA: NONE SEEN
Bilirubin Urine: NEGATIVE
Glucose, UA: NEGATIVE mg/dL
Ketones, ur: NEGATIVE mg/dL
Leukocytes,Ua: NEGATIVE
Nitrite: NEGATIVE
Protein, ur: NEGATIVE mg/dL
Specific Gravity, Urine: 1.005 (ref 1.005–1.030)
pH: 5 (ref 5.0–8.0)

## 2021-11-23 LAB — CBC
HCT: 35.3 % — ABNORMAL LOW (ref 39.0–52.0)
Hemoglobin: 11.7 g/dL — ABNORMAL LOW (ref 13.0–17.0)
MCH: 28.1 pg (ref 26.0–34.0)
MCHC: 33.1 g/dL (ref 30.0–36.0)
MCV: 84.7 fL (ref 80.0–100.0)
Platelets: 637 10*3/uL — ABNORMAL HIGH (ref 150–400)
RBC: 4.17 MIL/uL — ABNORMAL LOW (ref 4.22–5.81)
RDW: 14 % (ref 11.5–15.5)
WBC: 11.7 10*3/uL — ABNORMAL HIGH (ref 4.0–10.5)
nRBC: 0 % (ref 0.0–0.2)

## 2021-11-23 NOTE — Telephone Encounter (Signed)
Received call from Clare Gandy (PT) with [facility] advised patient's brother took him to the ER today due to patient  cramping real bad through out his body. Gershon Mussel said do treatment or get a progress note on the patient. Tom said patient could not do the CPM machine due to all the cramping he was having. The number to contact Gershon Mussel is [phone]

## 2021-11-23 NOTE — ED Notes (Addendum)
Pt called for room x3 with no response. Pt not seen outside or in lobby.

## 2021-11-23 NOTE — Telephone Encounter (Signed)
Answer Assessment - Initial Assessment Questions 1. ONSET: "When did the pain start?"     Last Wednesday 2. LOCATION: "Where is the pain located?"     Both shoulders "Cramping" fingers cramping, "Everywhere" 3. PAIN: "How bad is the pain?" (Scale 1-10; or mild, moderate, severe)   - MILD (1-3): doesn't interfere with normal activities   - MODERATE (4-7): interferes with normal activities (e.g., work or school) or awakens from sleep   - SEVERE (8-10): excruciating pain, unable to do any normal activities, unable to move arm at all due to pain     Cramping 4. WORK OR EXERCISE: "Has there been any recent work or exercise that involved this part of the body?"     No 5. CAUSE: "What do you think is causing the shoulder pain?"     Unsure 6. OTHER SYMPTOMS: "Do you have any other symptoms?" (e.g., neck pain, swelling, rash, fever, numbness, weakness)     Cannot use CPM for knee due to cramping  Protocols used: Shoulder Pain-A-AH

## 2021-11-23 NOTE — Telephone Encounter (Signed)
  Chief Complaint: "Cramping" Symptoms: "Cramping all over" both shoulders, fingers "Spreads everywhere" States cannot use CPM machine due to cramping. Frequency: Last Wednesday, worsening Pertinent Negatives: Patient denies  Disposition: [x] ED /[] Urgent Care (no appt availability in office) / [] Appointment(In office/virtual)/ []  [facility]/ [] Home Care/ [] Refused Recommended Disposition /[] [facility]/ []  Follow-up with PCP Additional Notes: Pt declines appt as he needs 3 days to secure transportation "I need something now for this cramping." Perseverating on "There must be a medicine that will help this." Advised would need eval, advised ED, declines. Has appt with surgeon tomorrow. Also requesting home health. States [employer] said he qualifies.   Please advise. Reason for Disposition  [1] SEVERE pain AND [2] not improved 2 hours after pain medicine    "Cramping all over."  Protocols used: Shoulder Pain-A-AH

## 2021-11-23 NOTE — ED Provider Triage Note (Signed)
Emergency Medicine Provider Triage Evaluation Note  Robert Rhodes , a 76 y.o. male  was evaluated in triage.  Pt complains of generalized body cramping x 2 days. Was seen recently for similar symptoms and said he got better with IV fluids. Recently had right knee replacement.   Review of Systems  Positive: Body aches Negative: Fever, cp, sob  Physical Exam  BP 135/64 (BP Location: Right Arm)   Pulse 88   Temp 98.8 F (37.1 C) (Oral)   Resp 18   SpO2 96%  Gen:   Awake, no distress   Resp:  Normal effort  MSK:   Moves extremities without difficulty  Other:    Medical Decision Making  Medically screening exam initiated at 2:12 PM.  Appropriate orders placed.  Robert Rhodes was informed that the remainder of the evaluation will be completed by another provider, this initial triage assessment does not replace that evaluation, and the importance of remaining in the ED until their evaluation is complete.  Will start with basic lab workup and UA   Kree Armato T, PA-C 11/23/21 1414

## 2021-11-23 NOTE — ED Triage Notes (Signed)
Pt states he has been having body cramps for over a week. Denies n/v. He states he does feel like his mouth is dry. Pt was seen last week for the same and it got better after IV fluids but it has started again. Pt states he had a right knee replacement June 1st.

## 2021-11-24 ENCOUNTER — Telehealth: Payer: Self-pay | Admitting: Orthopaedic Surgery

## 2021-11-24 ENCOUNTER — Telehealth: Payer: Self-pay | Admitting: Physician Assistant

## 2021-11-24 NOTE — Telephone Encounter (Signed)
Spoke to patient

## 2021-11-24 NOTE — Telephone Encounter (Signed)
Ok, thanks.

## 2021-11-24 NOTE — Telephone Encounter (Signed)
Clare Gandy from [facility] called back. He is the PT working with patient. Would like a call back. [phone]

## 2021-11-24 NOTE — Telephone Encounter (Signed)
Pt was called and a VM was left informing patient that all concerns will be discussed at upcoming visit on 11/30/2021

## 2021-11-24 NOTE — Telephone Encounter (Signed)
Clare Gandy (PT) from [facility] called with an [place]. States unable to do home health Pt. Pt went to ED but did not stay to be treated. Tom phone number is [phone].

## 2021-11-24 NOTE — Telephone Encounter (Signed)
Clare Gandy (PT) with [facility] returned call to [facility] asked for a call back tomorrow.    Ph# [phone]

## 2021-11-25 ENCOUNTER — Other Ambulatory Visit: Payer: Self-pay | Admitting: *Deleted

## 2021-11-25 ENCOUNTER — Telehealth: Payer: Self-pay | Admitting: *Deleted

## 2021-11-25 ENCOUNTER — Telehealth: Payer: Self-pay | Admitting: Physician Assistant

## 2021-11-25 DIAGNOSIS — Z96651 Presence of right artificial knee joint: Secondary | ICD-10-CM

## 2021-11-25 DIAGNOSIS — M1711 Unilateral primary osteoarthritis, right knee: Secondary | ICD-10-CM

## 2021-11-25 NOTE — Telephone Encounter (Signed)
I was able to speak to the patient yesterday and will see him tomorrow

## 2021-11-25 NOTE — Telephone Encounter (Signed)
I have already spoken to patient about the issue

## 2021-11-25 NOTE — Telephone Encounter (Signed)
Clare Gandy (physical therapist) return call to [place]. Please call Tom at [phone].

## 2021-11-25 NOTE — Telephone Encounter (Signed)
Spoke with patient today and he states he is feeling better. Cramping has improved. He has increased his fluid intake and able to use his CPM. Orders for OPPT placed in chart to hopefully start next week. Will update HHPT. Appointment with Dr. Phoebe Sharps PA tomorrow. Will continue to follow for needs.

## 2021-11-26 ENCOUNTER — Ambulatory Visit (INDEPENDENT_AMBULATORY_CARE_PROVIDER_SITE_OTHER): Payer: Medicare HMO | Admitting: Physician Assistant

## 2021-11-26 ENCOUNTER — Telehealth: Payer: Self-pay | Admitting: *Deleted

## 2021-11-26 DIAGNOSIS — Z96651 Presence of right artificial knee joint: Secondary | ICD-10-CM

## 2021-11-26 NOTE — Progress Notes (Signed)
Post-Op Visit Note   Patient: Robert Rhodes           Date of Birth: [birth year]           MRN: [id number] Visit Date: 11/26/2021 PCP: [name], MD   Assessment & Plan:  Chief Complaint: No chief complaint on file.  Visit Diagnoses:  1. Hx of total knee replacement, right     Plan: Patient is a pleasant 76 year old gentleman who comes in today 2 weeks status post right total knee replacement 11/11/2021.  He has been doing well.  He has been getting home health physical therapy but stopped early due to total body cramping.  This is since resolved.  He has been compliant taking his Xarelto for DVT prophylaxis.  Pain is mild to moderate which is relieved with narcotics.  Examination of his right knee reveals a fully healed surgical scar with nylon sutures in place.  No evidence of infection or cellulitis.  Calves are soft nontender.  He is neurovascular tact distally.  Today, sutures removed and Steri-Strips applied.  He will continue with home health physical therapy this coming week and start outpatient physical therapy next week as they are not able to get him in sooner.  He will finish his bottle of Xarelto for DVT prophylaxis.  Dental prophylaxis reinforced.  Follow-up with [place] in 4 weeks time for repeat evaluation and 2 view x-rays of the right knee.  Call with concerns or questions.  Follow-Up Instructions: Return in about 4 weeks (around 12/24/2021).   Orders:  No orders of the defined types were placed in this encounter.  No orders of the defined types were placed in this encounter.   Imaging: No new imaging  PMFS History: Patient Active Problem List   Diagnosis Date Noted   Primary osteoarthritis of right knee 11/11/2021   Status post total right knee replacement 11/11/2021   AKI (acute kidney injury) ([place]) 04/21/2019   Pneumonia due to COVID-19 virus 04/21/2019   Osteoporosis    Ulcerative colitis, with rectal bleeding 08/06/2015   Gastroesophageal reflux disease  without esophagitis 08/06/2015   Visual impairment of right eye 06/18/2015   Essential hypertension, benign 06/18/2015   Hernia of abdominal cavity 11/27/2014   Bipolar 1 disorder, mixed, moderate ([place]) 11/27/2014   Cellulitis 08/23/2014   Medically noncompliant 08/23/2014   Left leg cellulitis 08/23/2014   Cellulitis of left leg    Hypokalemia    Diarrhea 05/06/2014   Ulcerative colitis ([place]) 04/17/2014   Verrucous keratosis 04/17/2014   Nonspecific (abnormal) findings on radiological and other examination of gastrointestinal tract 07/18/2013   Acute esophagitis 07/18/2013   Hypovitaminosis D 05/06/2013   Ulcerative colitis, unspecified 04/04/2013   Knee pain, bilateral 03/05/2013   Past Medical History:  Diagnosis Date   Adenomatous colon polyp    Arthritis    bilateral knees-needs replacements   Claustrophobia    Colon cancer ([place]) 2015   COVID-19    Eye abnormalities    right eye seen in ED 02/17/2014 wearing eye patch using eye drops   GERD (gastroesophageal reflux disease)    with certain foods   Glaucoma    not on eye drops at this time (10/21/2020)   Hypertension    on meds   Irregular heartbeat    per pt/doesn't know what it does   Osteoarthritis    bilat knees  needs replacement   Osteoporosis    pt denies   Substance abuse ([place])    stopped over  3 years ago   UC (ulcerative colitis) ([place])    Vitamin D deficiency     Family History  Problem Relation Age of Onset   Cancer Mother        type unknown-in her leg   Diabetes Father    Heart disease Father    Heart disease Sister    Heart attack Brother    Healthy Sister    Colon cancer Neg Hx    Esophageal cancer Neg Hx    Rectal cancer Neg Hx    Stomach cancer Neg Hx    Colon polyps Neg Hx     Past Surgical History:  Procedure Laterality Date   COLON RESECTION N/A 08/23/2013   Procedure: Laparoscopic total abdominal colectomy and hernia repair;  Surgeon: Leighton Ruff, MD;  [facility];  Service:  General;  Laterality: N/A;   COLON SURGERY  08/2013   COLONOSCOPY  11/02/2020   EUS N/A 07/18/2013   Procedure: UPPER ENDOSCOPIC ULTRASOUND (EUS) RADIAL;  Surgeon: Milus Banister, MD;  [facility];  Service: Endoscopy;  Laterality: N/A;   EYE SURGERY     FLEXIBLE SIGMOIDOSCOPY N/A 2020   prep good   HERNIA REPAIR  2005   In [place] in [place]   INCISIONAL HERNIA REPAIR N/A 02/20/2014   Procedure: LAP ASSISTED INCISIONAL HERNIA REPAIR LYSIS OF ADHESIONS;  Surgeon: Leighton Ruff, MD;  [facility];  Service: General;  Laterality: N/A;  converted to open @ [place] N/A 02/20/2014   Procedure: HERNIA REPAIR INCISIONAL;  Surgeon: Leighton Ruff, MD;  [facility];  Service: General;  Laterality: N/A;  With MESH   INTRAOCULAR LENS INSERTION Bilateral    6 yrs ago   TOTAL KNEE ARTHROPLASTY Right 11/11/2021   Procedure: RIGHT TOTAL KNEE ARTHROPLASTY;  Surgeon: Leandrew Koyanagi, MD;  Location: [facility];  Service: Orthopedics;  Laterality: Right;   VENTRAL HERNIA REPAIR     Social History   Occupational History   Occupation: retired  Tobacco Use   Smoking status: Former    Packs/day: 0.00    Types: Cigarettes    Quit date: 06/14/2003    Years since quitting: 18.4   Smokeless tobacco: Never   Tobacco comments:    quit in July 2005  Vaping Use   Vaping Use: Never used  Substance and Sexual Activity   Alcohol use: No   Drug use: Not Currently    Types: 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

## 2021-11-26 NOTE — Telephone Encounter (Signed)
Ortho bundle 14 day in office meeting completed.

## 2021-11-30 ENCOUNTER — Ambulatory Visit: Payer: Medicare HMO | Attending: Family Medicine | Admitting: Family Medicine

## 2021-11-30 ENCOUNTER — Encounter: Payer: Self-pay | Admitting: Family Medicine

## 2021-11-30 VITALS — BP 153/74 | HR 90 | Temp 98.4°F | Ht 69.0 in | Wt 208.4 lb

## 2021-11-30 DIAGNOSIS — Z0001 Encounter for general adult medical examination with abnormal findings: Secondary | ICD-10-CM | POA: Diagnosis not present

## 2021-11-30 DIAGNOSIS — Z0000 Encounter for general adult medical examination without abnormal findings: Secondary | ICD-10-CM

## 2021-11-30 DIAGNOSIS — R252 Cramp and spasm: Secondary | ICD-10-CM | POA: Diagnosis not present

## 2021-11-30 DIAGNOSIS — I10 Essential (primary) hypertension: Secondary | ICD-10-CM | POA: Diagnosis not present

## 2021-11-30 DIAGNOSIS — E871 Hypo-osmolality and hyponatremia: Secondary | ICD-10-CM | POA: Diagnosis not present

## 2021-11-30 NOTE — Patient Instructions (Signed)
  Robert Rhodes , Thank you for taking time to come for your Medicare Wellness Visit. I appreciate your ongoing commitment to your health goals. Please review the following plan we discussed and let me know if I can assist you in the future.   These are the goals we discussed:  Goals      DIET - EAT MORE FRUITS AND VEGETABLES     Exercise 150 min/wk Moderate Activity        This is a list of the screening recommended for you and due dates:  Health Maintenance  Topic Date Due   Zoster (Shingles) Vaccine (2 of 2) 07/15/2019   COVID-19 Vaccine (4 - Pfizer series) 05/02/2020   Flu Shot  01/11/2022   Colon Cancer Screening  11/03/2022   Tetanus Vaccine  11/26/2029   Pneumonia Vaccine  Completed   Hepatitis C Screening: USPSTF Recommendation to screen - Ages 18-79 yo.  Completed   HPV Vaccine  Aged Out

## 2021-11-30 NOTE — Progress Notes (Signed)
Medicare wellness

## 2021-11-30 NOTE — Telephone Encounter (Signed)
Pt is requesting home health due to his recent surgery.

## 2021-11-30 NOTE — Progress Notes (Signed)
Subjective:   Robert Rhodes is a 76 y.o. male who presents for Medicare Annual/Subsequent preventive examination. 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 His BP is elevated. He states he increased his sodium intake due to previously being informed he had hyponatremia from his last ED visit. He had been having intermittent body cramps especially after abnormal positioning. His fingers sometimes stick together and this led him to present to the ED on 11/23/2021 where labs had revealed hyponatremia of 129, potassium was normal at 4.5. He could not wait for evaluation due to the long wait time.  Review of Systems    General: negative for fever, weight loss, appetite change Eyes: no visual symptoms. ENT: no ear symptoms, no sinus tenderness, no nasal congestion or sore throat. Neck: no pain  Respiratory: no wheezing, shortness of breath, cough Cardiovascular: no chest pain, no dyspnea on exertion, no pedal edema, no orthopnea. Gastrointestinal: no abdominal pain, no diarrhea, no constipation Genito-Urinary: no urinary frequency, no dysuria, no polyuria. Hematologic: no bruising Endocrine: no cold or heat intolerance Neurological: no headaches, no seizures, no tremors Musculoskeletal: see HPI Skin: no pruritus, no rash. Psychological: no depression, no anxiety,         Objective:    Today's Vitals   11/30/21 1005  BP: (!) 150/68  Pulse: 90  Temp: 98.4 F (36.9 C)  TempSrc: Oral  SpO2: 99%  Weight: 208 lb 6.4 oz (94.5 kg)  Height: 5' 9"  (1.753 m)   Body mass index is 30.78 kg/m.     11/30/2021   10:10 AM 11/23/2021    1:57 PM 11/11/2021    4:00 PM 11/11/2021    5:50 AM 11/03/2021    9:17 AM 11/12/2020   10:19 AM 11/27/2019    2:30 PM  Advanced  Directives  Does Patient Have a Medical Advance Directive? No No No No No No No  Would patient like information on creating a medical advance directive? Yes (ED - Information included in AVS) No - Patient declined No - Patient declined  No - Patient declined      Current Medications (verified) Outpatient Encounter Medications as of 11/30/2021  Medication Sig   amLODipine (NORVASC) 5 MG tablet TAKE 1 TABLET BY MOUTH ONCE DAILY- HYPERTENSION   atorvastatin (LIPITOR) 20 MG tablet Take 1 tablet (20 mg total) by mouth daily.   FIBER ADULT GUMMIES PO Take 2 tablets by mouth daily.   omeprazole (PRILOSEC OTC) 20 MG tablet Take 20 mg by mouth daily as needed (acid reflux).   ondansetron (ZOFRAN) 4 MG tablet Take 1 tablet (4 mg total) by mouth every 8 (eight) hours as needed for nausea or vomiting.   oxyCODONE (ROXICODONE) 5 MG immediate release tablet Take 1 tab po every 8 hours prn pain.   Potassium 99 MG TABS Take 99 mg by mouth daily.   docusate sodium (COLACE) 100 MG capsule Take 1 capsule (100 mg total) by mouth daily as needed. (Patient not taking: Reported on 11/30/2021)   magnesium oxide (MAG-OX) 400 MG tablet Take 400 mg by mouth daily. (Patient not taking: Reported on 11/30/2021)   methocarbamol (ROBAXIN-750) 750 MG tablet Take 1 tablet (750 mg total) by mouth 2 (two) times daily as needed for muscle spasms. (Patient not taking: Reported on 11/30/2021)  rivaroxaban (XARELTO) 10 MG TABS tablet Take 1 tablet (10 mg total) by mouth daily. To be taken after surgery to prevent blood clots (Patient not taking: Reported on 11/30/2021)   sulfamethoxazole-trimethoprim (BACTRIM DS) 800-160 MG tablet Take 1 tablet by mouth 2 (two) times daily. (Patient not taking: Reported on 11/30/2021)   No facility-administered encounter medications on file as of 11/30/2021.    Allergies (verified) Ace inhibitors, Advil [ibuprofen], Aspirin, and Tylenol [acetaminophen]   History: Past Medical History:  Diagnosis  Date   Adenomatous colon polyp    Arthritis    bilateral knees-needs replacements   Claustrophobia    Colon cancer ([place]) 2015   COVID-19    Eye abnormalities    right eye seen in ED 02/17/2014 wearing eye patch using eye drops   GERD (gastroesophageal reflux disease)    with certain foods   Glaucoma    not on eye drops at this time (10/21/2020)   Hypertension    on meds   Irregular heartbeat    per pt/doesn't know what it does   Osteoarthritis    bilat knees  needs replacement   Osteoporosis    pt denies   Substance abuse ([place])    stopped over 3 years ago   UC (ulcerative colitis) ([place])    Vitamin D deficiency    Past Surgical History:  Procedure Laterality Date   COLON RESECTION N/A 08/23/2013   Procedure: Laparoscopic total abdominal colectomy and hernia repair;  Surgeon: Leighton Ruff, MD;  Location: [facility];  Service: General;  Laterality: N/A;   COLON SURGERY  08/2013   COLONOSCOPY  11/02/2020   EUS N/A 07/18/2013   Procedure: UPPER ENDOSCOPIC ULTRASOUND (EUS) RADIAL;  Surgeon: Milus Banister, MD;  Location: [facility];  Service: Endoscopy;  Laterality: N/A;   EYE SURGERY     FLEXIBLE SIGMOIDOSCOPY N/A 2020   prep good   HERNIA REPAIR  2005   In [place]/put in a mesh   INCISIONAL HERNIA REPAIR N/A 02/20/2014   Procedure: LAP ASSISTED INCISIONAL HERNIA REPAIR LYSIS OF ADHESIONS;  Surgeon: Leighton Ruff, MD;  Location: [facility];  Service: General;  Laterality: N/A;  converted to open @ [place] N/A 02/20/2014   Procedure: HERNIA REPAIR INCISIONAL;  Surgeon: Leighton Ruff, MD;  Location: [facility];  Service: General;  Laterality: N/A;  With MESH   INTRAOCULAR LENS INSERTION Bilateral    6 yrs ago   TOTAL KNEE ARTHROPLASTY Right 11/11/2021   Procedure: RIGHT TOTAL KNEE ARTHROPLASTY;  Surgeon: Leandrew Koyanagi, MD;  Location: [facility];  Service: Orthopedics;  Laterality: Right;   VENTRAL HERNIA REPAIR     Family History  Problem Relation Age of Onset   Cancer Mother         type unknown-in her leg   Diabetes Father    Heart disease Father    Heart disease Sister    Heart attack Brother    Healthy Sister    Colon cancer Neg Hx    Esophageal cancer Neg Hx    Rectal cancer Neg Hx    Stomach cancer Neg Hx    Colon polyps Neg Hx    Social History   Socioeconomic History   Marital status: Single    Spouse name: Not on file   Number of children: 0   Years of education: Not on file   Highest education level: Not on file  Occupational History   Occupation: retired  Tobacco Use   Smoking  status: Former    Packs/day: 0.00    Types: Cigarettes    Quit date: 06/14/2003    Years since quitting: 18.4   Smokeless tobacco: Never   Tobacco comments:    quit in July 2005  Vaping Use   Vaping Use: Never used  Substance and Sexual Activity   Alcohol use: No   Drug use: Not Currently    Types: 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  Other Topics Concern   Not on file  Social History Narrative   Not on file   Social Determinants of Health   Financial Resource Strain: Not on file  Food Insecurity: Not on file  Transportation Needs: Not on file  Physical Activity: Not on file  Stress: Not on file  Social Connections: Not on file    Tobacco Counseling Counseling given: Not Answered Tobacco comments: quit in July 2005   Clinical Intake:  Pre-visit preparation completed: No  Pain : No/denies pain     Diabetes: No     Diabetic?No  Interpreter Needed?: No      Activities of Daily Living    11/30/2021   10:10 AM 11/11/2021    4:00 PM  In your present state of health, do you have any difficulty performing the following activities:  Hearing? 0 0  Vision? 0 0  Difficulty concentrating or making decisions? 0 0  Walking or climbing stairs? 1 1  Dressing or bathing? 0 1  Doing errands, shopping? 0 1  Preparing Food and eating ? N   Using the Toilet? N   In the past six months, have you  accidently leaked urine? N   Do you have problems with loss of bowel control? N   Managing your Medications? N   Managing your Finances? N   Housekeeping or managing your Housekeeping? N     Patient Care Team: [name], MD as PCP - General (Family Medicine) Ladell Pier, MD as Consulting Physician (Oncology)  Indicate any recent Medical Services you may have received from other than [facility] providers in the past year (date may be approximate).     Assessment:   This is a routine wellness examination for [place].  Hearing/Vision screen No results found.  Dietary issues and exercise activities discussed: Current Exercise Habits: Home exercise routine, Type of exercise: strength training/weights, Frequency (Times/Week): 7, Intensity: Mild   Goals Addressed   None    Depression Screen    11/30/2021   10:10 AM 10/21/2021   10:24 AM 09/16/2021   10:03 AM 11/12/2020   10:19 AM 11/27/2019    2:30 PM 07/10/2018    2:55 PM 11/08/2017    2:03 PM  PHQ 2/9 Scores  PHQ - 2 Score 1 0 0 0 0 0 4  PHQ- 9 Score   0   1 6    Fall Risk    11/30/2021   10:10 AM 09/16/2021    9:23 AM 11/12/2020   10:19 AM 11/27/2019    2:30 PM 07/10/2018    2:48 PM  Fall Risk   Falls in the past year? 0 0 0 0 0  Number falls in past yr: 0 0 0    Injury with Fall? 0 0 0    Risk for fall due to :    No Fall Risks     FALL RISK PREVENTION PERTAINING TO THE HOME:  Any stairs in or around the home? No  If  so, are there any without handrails?  Nausea/a Home free of loose throw rugs in walkways, pet beds, electrical cords, etc? No  Adequate lighting in your home to reduce risk of falls? Yes   ASSISTIVE DEVICES UTILIZED TO PREVENT FALLS:  Life alert? Yes  Use of a cane, walker or w/c? Yes  Grab bars in the bathroom? Yes  Shower chair or bench in shower? Yes  Elevated toilet seat or a handicapped toilet? No   TIMED UP AND GO:  Was the test performed? No .  Length of time to ambulate 10 feet: not  performed.   Gait slow and steady with assistive device  Cognitive Function:    11/27/2019    2:45 PM  MMSE - Mini Mental State Exam  Orientation to time 5  Orientation to Place 5  Registration 3  Attention/ Calculation 5  Recall 3  Language- name 2 objects 2  Language- repeat 1  Language- follow 3 step command 3  Language- read & follow direction 1  Write a sentence 1  Copy design 1  Total score 30        Immunizations Immunization History  Administered Date(s) Administered   Influenza, High Dose Seasonal PF 03/15/2019   PFIZER(Purple Top)SARS-COV-2 Vaccination 07/18/2019, 08/08/2019, 03/07/2020   PNEUMOCOCCAL CONJUGATE-20 09/16/2021   PPD Test 02/25/2014   Pneumococcal Conjugate-13 05/20/2019, 11/27/2019   Tdap 11/27/2019   Zoster Recombinat (Shingrix) 05/20/2019     Screening Tests Health Maintenance  Topic Date Due   Zoster Vaccines- Shingrix (2 of 2) 07/15/2019   COVID-19 Vaccine (4 - Pfizer series) 05/02/2020   INFLUENZA VACCINE  01/11/2022   COLONOSCOPY (Pts 45-3yr Insurance coverage will need to be confirmed)  11/03/2022   TETANUS/TDAP  11/26/2029   Pneumonia Vaccine 76 Years old  Completed   Hepatitis C Screening  Completed   HPV VACCINES  Aged Out    Health Maintenance  Health Maintenance Due  Topic Date Due   Zoster Vaccines- Shingrix (2 of 2) 07/15/2019   COVID-19 Vaccine (4 - Pfizer series) 05/02/2020    Colorectal cancer screening: No longer required.   Lung Cancer Screening: (Low Dose CT Chest recommended if Age 78-80years, 30 pack-year currently smoking OR have quit w/in 15years.) does not qualify.   Lung Cancer Screening Referral:  n/a  Additional Screening:  Hepatitis C Screening: does qualify; Completed 11/17/20  Community Resource Referral / Chronic Care Management: CRR required this visit?  No   CCM required this visit?  No   Physical exam: Alert, awake, oriented x3 Neck: No JVD Respiration: Normal breath sounds, no  wheezing Cardiac: S1-S2 regular rate rhythm, no murmurs Abdomen: Soft, nontender Extremities: Slightly reduced range of motion in right knee, left knee is normal Neuro: At baseline, abnormal gait-ambulates with a walker Psych: Normal mood   Plan:   1. Encounter for Medicare annual wellness exam Counseled on 150 minutes of exercise per week, healthy eating (including decreased daily intake of saturated fats, cholesterol, added sugars, sodium), routine healthcare maintenance.   2. Muscle cramp We will need to check for electrolyte derangements Due to age of exercise caution with regards to initiation of muscle relaxant Advised to stay hydrated - Basic Metabolic Panel - Magnesium  3. Hyponatremia Sodium of 129 during ED visit We will repeat today  4. Essential hypertension, benign Uncontrolled Blood pressures have been previously controlled It could be that his restating creatinine sodium ingestion to correct his hyponatremia could have contributed I will have him follow-up with  the clinical pharmacist and if blood pressure remains above goal consider increasing dose of amlodipine Counseled on blood pressure goal of less than 130/80, low-sodium, DASH diet, medication compliance, 150 minutes of moderate intensity exercise per week. Discussed medication compliance, adverse effects.    I have personally reviewed and noted the following in the patient's chart:   Medical and social history Use of alcohol, tobacco or illicit drugs  Current medications and supplements including opioid prescriptions. Patient is not currently taking opioid prescriptions. Functional ability and status Nutritional status Physical activity Advanced directives List of other physicians Hospitalizations, surgeries, and ER visits in previous 12 months Vitals Screenings to include cognitive, depression, and falls Referrals and appointments  In addition, I have reviewed and discussed with patient certain  preventive protocols, quality metrics, and best practice recommendations. A written personalized care plan for preventive services as well as general preventive health recommendations were provided to patient.     [name], MD   11/30/2021

## 2021-12-01 LAB — BASIC METABOLIC PANEL
BUN/Creatinine Ratio: 11 (ref 10–24)
BUN: 11 mg/dL (ref 8–27)
CO2: 23 mmol/L (ref 20–29)
Calcium: 9.7 mg/dL (ref 8.6–10.2)
Chloride: 94 mmol/L — ABNORMAL LOW (ref 96–106)
Creatinine, Ser: 0.96 mg/dL (ref 0.76–1.27)
Glucose: 81 mg/dL (ref 70–99)
Potassium: 3.8 mmol/L (ref 3.5–5.2)
Sodium: 132 mmol/L — ABNORMAL LOW (ref 134–144)
eGFR: 81 mL/min/{1.73_m2} (ref >59)

## 2021-12-01 LAB — MAGNESIUM: Magnesium: 2 mg/dL (ref 1.6–2.3)

## 2021-12-01 NOTE — Telephone Encounter (Signed)
Done

## 2021-12-02 ENCOUNTER — Ambulatory Visit: Payer: Medicare HMO | Attending: Orthopaedic Surgery

## 2021-12-02 DIAGNOSIS — R2689 Other abnormalities of gait and mobility: Secondary | ICD-10-CM | POA: Insufficient documentation

## 2021-12-02 DIAGNOSIS — M6281 Muscle weakness (generalized): Secondary | ICD-10-CM | POA: Insufficient documentation

## 2021-12-02 DIAGNOSIS — M25561 Pain in right knee: Secondary | ICD-10-CM | POA: Insufficient documentation

## 2021-12-02 DIAGNOSIS — Z96651 Presence of right artificial knee joint: Secondary | ICD-10-CM | POA: Diagnosis not present

## 2021-12-02 DIAGNOSIS — M1711 Unilateral primary osteoarthritis, right knee: Secondary | ICD-10-CM | POA: Diagnosis not present

## 2021-12-02 NOTE — Therapy (Signed)
OUTPATIENT PHYSICAL THERAPY LOWER EXTREMITY EVALUATION   Patient Name: Robert Rhodes MRN: [id number] DOB:01-27-1946, 76 y.o., male Today's Date: 12/02/2021   PT End of Session - 12/02/21 1355     Visit Number 1    Number of Visits 24    Date for PT Re-Evaluation 02/24/22    Authorization Type [employer] MCR    PT Start Time 1400    PT Stop Time 1440    PT Time Calculation (min) 40 min    Activity Tolerance Patient tolerated treatment well    Behavior During Therapy WFL for tasks assessed/performed             Past Medical History:  Diagnosis Date   Adenomatous colon polyp    Arthritis    bilateral knees-needs replacements   Claustrophobia    Colon cancer ([place]) 2015   COVID-19    Eye abnormalities    right eye seen in ED 02/17/2014 wearing eye patch using eye drops   GERD (gastroesophageal reflux disease)    with certain foods   Glaucoma    not on eye drops at this time (10/21/2020)   Hypertension    on meds   Irregular heartbeat    per pt/doesn't know what it does   Osteoarthritis    bilat knees  needs replacement   Osteoporosis    pt denies   Substance abuse ([place])    stopped over 3 years ago   UC (ulcerative colitis) ([place])    Vitamin D deficiency    Past Surgical History:  Procedure Laterality Date   COLON RESECTION N/A 08/23/2013   Procedure: Laparoscopic total abdominal colectomy and hernia repair;  Surgeon: Leighton Ruff, MD;  [facility]: [facility];  Service: General;  Laterality: N/A;   COLON SURGERY  08/2013   COLONOSCOPY  11/02/2020   EUS N/A 07/18/2013   Procedure: UPPER ENDOSCOPIC ULTRASOUND (EUS) RADIAL;  Surgeon: Milus Banister, MD;  [facility];  Service: Endoscopy;  Laterality: N/A;   EYE SURGERY     FLEXIBLE SIGMOIDOSCOPY N/A 2020   prep good   HERNIA REPAIR  2005   [place]   INCISIONAL HERNIA REPAIR N/A 02/20/2014   Procedure: LAP ASSISTED INCISIONAL HERNIA REPAIR LYSIS OF ADHESIONS;  Surgeon: Leighton Ruff, MD;  Location:  [facility];  Service: General;  Laterality: N/A;  converted to open @ [place] N/A 02/20/2014   Procedure: HERNIA REPAIR INCISIONAL;  Surgeon: Leighton Ruff, MD;  [facility]: [facility];  Service: General;  Laterality: N/A;  With MESH   INTRAOCULAR LENS INSERTION Bilateral    6 yrs ago   TOTAL KNEE ARTHROPLASTY Right 11/11/2021   Procedure: RIGHT TOTAL KNEE ARTHROPLASTY;  Surgeon: Leandrew Koyanagi, MD;  Location: [facility];  Service: Orthopedics;  Laterality: Right;   VENTRAL HERNIA REPAIR     Patient Active Problem List   Diagnosis Date Noted   Primary osteoarthritis of right knee 11/11/2021   Status post total right knee replacement 11/11/2021   AKI (acute kidney injury) ([place]) 04/21/2019   Pneumonia due to COVID-19 virus 04/21/2019   Osteoporosis    Ulcerative colitis, with rectal bleeding 08/06/2015   Gastroesophageal reflux disease without esophagitis 08/06/2015   Visual impairment of right eye 06/18/2015   Essential hypertension, benign 06/18/2015   Hernia of abdominal cavity 11/27/2014   Bipolar 1 disorder, mixed, moderate ([place]) 11/27/2014   Cellulitis 08/23/2014   Medically noncompliant 08/23/2014   Left leg cellulitis 08/23/2014  Cellulitis of left leg    Hypokalemia    Diarrhea 05/06/2014   Ulcerative colitis ([place]) 04/17/2014   Verrucous keratosis 04/17/2014   Nonspecific (abnormal) findings on radiological and other examination of gastrointestinal tract 07/18/2013   Acute esophagitis 07/18/2013   Hypovitaminosis D 05/06/2013   Ulcerative colitis, unspecified 04/04/2013   Knee pain, bilateral 03/05/2013    PCP: [name], MD   REFERRING PROVIDER: Leandrew Koyanagi, MD  REFERRING DIAG:  [phone] (ICD-10-CM) - Status post total right knee replacement M17.11 (ICD-10-CM) - Primary osteoarthritis of right knee  THERAPY DIAG:  Acute pain of right knee - Plan: PT plan of care cert/re-cert  Muscle weakness (generalized) - Plan: PT plan of care  cert/re-cert  Other abnormalities of gait and mobility - Plan: PT plan of care cert/re-cert  Rationale for Evaluation and Treatment Rehabilitation  ONSET DATE: 11/11/2021  SUBJECTIVE:   SUBJECTIVE STATEMENT: Pt presents to PT s/p R TKA performed by Dr. Erlinda Hong on 11/11/2021. Has been doing well recently, notes that this past week he has had decrease in pain. He did well with HHPT and notes that he feels he no longer needs his FWW to get around. Has been using SPC around the house, which is the AD he was also using prior to surgery.   PERTINENT HISTORY: Osteoporosis; bipolar disorder   PAIN:  Are you having pain?  Yes: NPRS scale: 2/10 (8/10 at worst) Pain location: R knee Pain description: stiff, sharp Aggravating factors: prolonged position Relieving factors: movement, ice  PRECAUTIONS: None  WEIGHT BEARING RESTRICTIONS Yes WBAT  FALLS:  Has patient fallen in last 6 months? No  LIVING ENVIRONMENT: Lives with: lives alone Lives in: House/apartment Stairs: No Has following equipment at home: Single point cane, Environmental consultant - 2 wheeled, and Grab bars  OCCUPATION: Retired  PLOF: Independent and Independent with basic ADLs  PATIENT GOALS: decrease knee pain, improve mobility around community   OBJECTIVE:   DIAGNOSTIC FINDINGS:   See imaging  PATIENT SURVEYS:  FOTO: 47% function; 64% predicted   COGNITION:  Overall cognitive status: Within functional limits for tasks assessed     SENSATION: Light touch: decreased light touch in lateral R tibaia  POSTURE:  rounded shoulders and forward head  PALPATION: Slight TTP to distal R quad  LOWER EXTREMITY ROM:     AROM Right 12/02/2021   Left 12/02/2021   Knee flexion 98   Knee extension 7      (Blank rows = not tested)   LOWER EXTREMITY MMT:    MMT Right 12/02/2021  Left 12/02/2021   Hip flexion     Hip extension    Hip abduction    Hip adduction    Hip external rotation    Hip internal rotation    Knee extension  DNT   Knee flexion DNT   Ankle dorsiflexion     Ankle plantarflexion    Ankle inversion    Ankle eversion    Grossly    (Blank rows = not tested)  FUNCTIONAL TESTS:  30 Second Sit to Stand: 14 reps with bilateral UE support  GAIT: Distance walked: 55f Assistive device utilized: WEnvironmental consultant- 2 wheeled Level of assistance: Complete Independence Comments: decreased gait speed, antalgic gait, decrease R knee ext  [facility]  [facility]: 12/02/2021 Therapeutic Exercise: Seated hamstring stretch x 30" R Seated heel slide x 10 - 5" hold R Supine quad set x 10 - R - 5" hold Supine SLR x 5 R Supine heel slide x 5 - 5" hold R  PATIENT EDUCATION:  Education details: eval findings, FOTO, HEP, POC Person educated: Patient Education method: Explanation, Demonstration, and Handouts Education comprehension: verbalized understanding and returned demonstration   HOME EXERCISE PROGRAM: Access Code: [email or web address] URL: [email or web address] Date: 12/02/2021 Prepared by: Octavio Manns  Exercises - Seated Hamstring Stretch  - 2-3 x daily - 7 x weekly - 2-3 reps - 30 sec hold - Seated Heel Slide  - 2-3 x daily - 7 x weekly - 2-3 sets - 10 reps - 5 hold - Supine Quad Set  - 2-3 x daily - 7 x weekly - 2-3 sets - 10 reps - Active Straight Leg Raise with Quad Set  - 2-3 x daily - 7 x weekly - 2-3 sets - 10 reps - Supine Heel Slide  - 2-3 x daily - 7 x weekly - 2-3 sets - 10 reps - 5 sec hold  ASSESSMENT:  CLINICAL IMPRESSION: Patient is a 76 y.o. M who was seen today for physical therapy evaluation and treatment for s/p R TKA performed on 11/11/2021. Physical findings are consistent with surgery and recovery timeline, as pt demonstrates decreases in functional mobility, gait, and R knee ROM. His FOTO score shows he is operating below PLOF and indicates he would benefit from PT services post surgery in order to improve  safety, decrease pain, and improve functional ability.     OBJECTIVE IMPAIRMENTS Abnormal gait, decreased balance, decreased endurance, decreased mobility, difficulty walking, decreased ROM, decreased strength, and pain.   ACTIVITY LIMITATIONS carrying, lifting, squatting, stairs, and transfers  PARTICIPATION LIMITATIONS: meal prep, cleaning, and community activity  PERSONAL FACTORS 1-2 comorbidities: Osteoporosis; bipolar disorder   are also affecting patient's functional outcome.   REHAB POTENTIAL: Excellent  CLINICAL DECISION MAKING: Stable/uncomplicated  EVALUATION COMPLEXITY: Low   GOALS: Goals reviewed with patient? No  SHORT TERM GOALS: Target date: 12/23/2021  Pt will be compliant and knowledgeable with initial HEP for improved comfort and carryover Baseline: initial HEP given Goal status: INITIAL  2.  Pt will self report right knee pain no greater than 6/10 for improved comfort and functional ability Baseline: 8/10 at worst Goal status: INITIAL  LONG TERM GOALS: Target date: 02/24/2022   Pt will self report right knee pain no greater than 1-2/10 for improved comfort and functional ability Baseline: 8/10 at worst Goal status: INITIAL  2.  Pt will improve FOTO function score to no less than 64% as proxy for functional improvement Baseline: 47% function Goal status: INITIAL  3.  Pt will improve R knee AROM to range on 0-12 degrees for improved functional mobility and community navigation Baseline: see chart Goal status: INITIAL  4.  Pt will increase 30 Second Sit to Stand rep count to no less than 15 reps with no UE supportfor improved balance, strength, and functional mobility Baseline: 14 reps with bilateral UE support  Goal status: INITIAL   5.  Pt will be able to ambulate 1089f with LRAD for improved functional mobility and safety with community activites Baseline: unable Goal status: INITIAL  PLAN: PT FREQUENCY: 2x/week  PT DURATION: 12  weeks  PLANNED INTERVENTIONS: Therapeutic exercises, Therapeutic activity, Neuromuscular re-education, Balance training, Gait training, Patient/Family education, Joint mobilization, Aquatic Therapy, Dry Needling, Electrical stimulation, Cryotherapy, Moist heat, Taping,  Vasopneumatic device, Manual therapy, and Re-evaluation  PLAN FOR NEXT SESSION: assess HEP response, progress LE strength and R knee ROM  Referring diagnosis?  F38.329 (ICD-10-CM) - Status post total right knee replacement M17.11 (ICD-10-CM) - Primary osteoarthritis of right knee Treatment diagnosis? (if different than referring diagnosis)  Acute pain of right knee Muscle weakness (generalized) Other abnormalities of gait and mobility What was this (referring dx) caused by? [x]  Surgery []  Fall []  Ongoing issue []  Arthritis []  Other: ____________  Laterality: [x]  Rt []  Lt []  Both  Check all possible CPT codes:  *CHOOSE 10 OR LESS*    [x]  97110 (Therapeutic Exercise)  []  92507 (SLP Treatment)  [x]  97112 (Neuro Re-ed)   []  92526 (Swallowing Treatment)   [x]  97116 (Gait Training)   []  [id number] (Cognitive Training, 1st 15 minutes) [x]  97140 (Manual Therapy)   []  97130 (Cognitive Training, each add'l 15 minutes)  [x]  97164 (Re-evaluation)                              []  Other, List CPT Code ____________  [x]  19166 (Therapeutic Activities)     [x]  97535 (Self Care)   []  All codes above (97110 - 97535)  []  97012 (Mechanical Traction)  []  97014 (E-stim Unattended)  []  97032 (E-stim manual)  []  97033 (Ionto)  []  97035 (Ultrasound) []  97750 (Physical Performance Training) []  [id number] (Aquatic Therapy) [x]  97016 (Vasopneumatic Device) []  97018 (Paraffin) []  97034 (Contrast Bath) []  97597 (Wound Care 1st 20 sq cm) []  97598 (Wound Care each add'l 20 sq cm) []  97760 (Orthotic Fabrication, Fitting, Training Initial) []  [id number] (Prosthetic Management and Training Initial) []  [id number] (Orthotic or Prosthetic Training/ Modification  Subsequent)   Ward Chatters, PT 12/02/2021, 2:43 PM

## 2021-12-08 ENCOUNTER — Telehealth: Payer: Self-pay | Admitting: Orthopaedic Surgery

## 2021-12-08 ENCOUNTER — Ambulatory Visit: Payer: Medicare HMO

## 2021-12-08 DIAGNOSIS — M25561 Pain in right knee: Secondary | ICD-10-CM | POA: Diagnosis not present

## 2021-12-08 DIAGNOSIS — R2689 Other abnormalities of gait and mobility: Secondary | ICD-10-CM | POA: Diagnosis not present

## 2021-12-08 DIAGNOSIS — Z96651 Presence of right artificial knee joint: Secondary | ICD-10-CM | POA: Diagnosis not present

## 2021-12-08 DIAGNOSIS — M6281 Muscle weakness (generalized): Secondary | ICD-10-CM

## 2021-12-08 DIAGNOSIS — M1711 Unilateral primary osteoarthritis, right knee: Secondary | ICD-10-CM | POA: Diagnosis not present

## 2021-12-08 NOTE — Telephone Encounter (Signed)
[employer] and said that he has been in touch with patient to take of this problem.

## 2021-12-08 NOTE — Therapy (Signed)
OUTPATIENT PHYSICAL THERAPY TREATMENT NOTE   Patient Name: Robert Rhodes MRN: [id number] DOB:12-Jul-1945, 76 y.o., male Today's Date: 12/08/2021  PCP: [name], MD  REFERRING PROVIDER: Leandrew Koyanagi, MD  END OF SESSION:   PT End of Session - 12/08/21 1443     Visit Number 2    Number of Visits 24    Date for PT Re-Evaluation 02/24/22    Authorization Type [employer] MCR    PT Start Time 0034    PT Stop Time 9179    PT Time Calculation (min) 45 min    Activity Tolerance Patient tolerated treatment well    Behavior During Therapy WFL for tasks assessed/performed             Past Medical History:  Diagnosis Date   Adenomatous colon polyp    Arthritis    bilateral knees-needs replacements   Claustrophobia    Colon cancer ([place]) 2015   COVID-19    Eye abnormalities    right eye seen in ED 02/17/2014 wearing eye patch using eye drops   GERD (gastroesophageal reflux disease)    with certain foods   Glaucoma    not on eye drops at this time (10/21/2020)   Hypertension    on meds   Irregular heartbeat    per pt/doesn't know what it does   Osteoarthritis    bilat knees  needs replacement   Osteoporosis    pt denies   Substance abuse ([place])    stopped over 3 years ago   UC (ulcerative colitis) ([place])    Vitamin D deficiency    Past Surgical History:  Procedure Laterality Date   COLON RESECTION N/A 08/23/2013   Procedure: Laparoscopic total abdominal colectomy and hernia repair;  Surgeon: Leighton Ruff, MD;  [facility];  Service: General;  Laterality: N/A;   COLON SURGERY  08/2013   COLONOSCOPY  11/02/2020   EUS N/A 07/18/2013   Procedure: UPPER ENDOSCOPIC ULTRASOUND (EUS) RADIAL;  Surgeon: Milus Banister, MD;  [facility];  Service: Endoscopy;  Laterality: N/A;   EYE SURGERY     FLEXIBLE SIGMOIDOSCOPY N/A 2020   prep good   HERNIA REPA[place]  2005   [place]   INCISIONAL HERNIA REPAIR N/A 02/20/2014   Procedure: LAP ASSISTED INCISIONAL  HERNIA REPAIR LYSIS OF ADHESIONS;  Surgeon: Leighton Ruff, MD;  [facility];  Service: General;  Laterality: N/A;  converted to open @ [place] N/A 02/20/2014   Procedure: HERNIA REPAIR INCISIONAL;  Surgeon: Leighton Ruff, MD;  [facility];  Service: General;  Laterality: N/A;  With MESH   INTRAOCULAR LENS INSERTION Bilateral    6 yrs ago   TOTAL KNEE ARTHROPLASTY Right 11/11/2021   Procedure: RIGHT TOTAL KNEE ARTHROPLASTY;  Surgeon: Leandrew Koyanagi, MD;  Location: [facility];  Service: Orthopedics;  Laterality: Right;   VENTRAL HERNIA REPAIR     Patient Active Problem List   Diagnosis Date Noted   Primary osteoarthritis of right knee 11/11/2021   Status post total right knee replacement 11/11/2021   AKI (acute kidney injury) ([place]) 04/21/2019   Pneumonia due to COVID-19 virus 04/21/2019   Osteoporosis    Ulcerative colitis, with rectal bleeding 08/06/2015   Gastroesophageal reflux disease without esophagitis 08/06/2015   Visual impairment of right eye 06/18/2015   Essential hypertension, benign 06/18/2015   Hernia of abdominal cavity 11/27/2014   Bipolar 1 disorder, mixed, moderate ([place]) 11/27/2014  Cellulitis 08/23/2014   Medically noncompliant 08/23/2014   Left leg cellulitis 08/23/2014   Cellulitis of left leg    Hypokalemia    Diarrhea 05/06/2014   Ulcerative colitis ([place]) 04/17/2014   Verrucous keratosis 04/17/2014   Nonspecific (abnormal) findings on radiological and other examination of gastrointestinal tract 07/18/2013   Acute esophagitis 07/18/2013   Hypovitaminosis D 05/06/2013   Ulcerative colitis, unspecified 04/04/2013   Knee pain, bilateral 03/05/2013    REFERRING DIAG: Z96.651 (ICD-10-CM) - Status post total right knee replacement M17.11 (ICD-10-CM) - Primary osteoarthritis of right knee  THERAPY DIAG:  Acute pain of right knee  Muscle weakness (generalized)  Other abnormalities of gait and mobility  Rationale for Evaluation and  Treatment Rehabilitation  PERTINENT HISTORY: Osteoporosis; bipolar disorder   PRECAUTIONS: None  ONSET DATE: 11/11/2021  SUBJECTIVE: Patient reports no current pain and 5/10 at the worst. He states that he misplaced his home exercises, reprinted this session.   PAIN:  Are you having pain?  Yes: NPRS scale: 0/10 (5/10 at worst) Pain location: R knee Pain description: stiff, sharp Aggravating factors: prolonged position Relieving factors: movement, ice   OBJECTIVE: (objective measures completed at initial evaluation unless otherwise dated)   DIAGNOSTIC FINDINGS:            See imaging   PATIENT SURVEYS:  FOTO: 47% function; 64% predicted    COGNITION:           Overall cognitive status: Within functional limits for tasks assessed                          SENSATION: Light touch: decreased light touch in lateral R tibaia   POSTURE:  rounded shoulders and forward head   PALPATION: Slight TTP to distal R quad   LOWER EXTREMITY ROM:      AROM Right 12/02/2021   Right 12/08/2021   Knee flexion 98  99  Knee extension 7  6                        (Blank rows = not tested)     LOWER EXTREMITY MMT:     MMT Right 12/02/2021  Left 12/02/2021   Hip flexion       Hip extension      Hip abduction      Hip adduction      Hip external rotation      Hip internal rotation      Knee extension DNT    Knee flexion DNT    Ankle dorsiflexion       Ankle plantarflexion      Ankle inversion      Ankle eversion      Grossly      (Blank rows = not tested)   FUNCTIONAL TESTS:  30 Second Sit to Stand: 14 reps with bilateral UE support   GAIT: Distance walked: 52f Assistive device utilized: WEnvironmental consultant- 2 wheeled Level of assistance: Complete Independence Comments: decreased gait speed, antalgic gait, decrease R knee ext   TODAY'S TREATMENT: [facility] Adult PT Treatment:                                                DATE: 12/08/2021 Therapeutic Exercise: Seated hamstring stretch 2 x  30" R Seated heel slide x 10 -  5" hold R LAQ 3" hold at top 2x10 Supine quad set 2 x 10 - R - 5" hold Supine SLR 2 x 10 R Supine heel slide 2 x 10 - 5" hold R Manual Therapy: AP mobs grade II-III Manual flexion/extension stretch Therapeutic Activity: Gait [phone] with and without [facility]   [facility] Adult PT Treatment:                                                DATE: 12/02/2021 Therapeutic Exercise: Seated hamstring stretch x 30" R Seated heel slide x 10 - 5" hold R Supine quad set x 10 - R - 5" hold Supine SLR x 5 R Supine heel slide x 5 - 5" hold R   PATIENT EDUCATION:  Education details: eval findings, FOTO, HEP, POC Person educated: Patient Education method: Explanation, Demonstration, and Handouts Education comprehension: verbalized understanding and returned demonstration     HOME EXERCISE PROGRAM: Access Code: [email or web address] URL: [email or web address] Date: 12/02/2021 Prepared by: Octavio Manns   Exercises - Seated Hamstring Stretch  - 2-3 x daily - 7 x weekly - 2-3 reps - 30 sec hold - Seated Heel Slide  - 2-3 x daily - 7 x weekly - 2-3 sets - 10 reps - 5 hold - Supine Quad Set  - 2-3 x daily - 7 x weekly - 2-3 sets - 10 reps - Active Straight Leg Raise with Quad Set  - 2-3 x daily - 7 x weekly - 2-3 sets - 10 reps - Supine Heel Slide  - 2-3 x daily - 7 x weekly - 2-3 sets - 10 reps - 5 sec hold   ASSESSMENT:   CLINICAL IMPRESSION: Patient presents to PT with no current pain and reports up to 5/10 pain the past few days. He reports he misplaced his home exercises so these were reprinted and reviewed this session. Session today focused on Rt quad strengthening and knee ROM. He is able to ambulate without AD with minimal fatigue and pain after. Patient was able to tolerate all prescribed exercises with no adverse effects. Patient continues to benefit from skilled PT services and should be progressed as able to improve functional independence.    OBJECTIVE  IMPAIRMENTS Abnormal gait, decreased balance, decreased endurance, decreased mobility, difficulty walking, decreased ROM, decreased strength, and pain.    ACTIVITY LIMITATIONS carrying, lifting, squatting, stairs, and transfers   PARTICIPATION LIMITATIONS: meal prep, cleaning, and community activity   PERSONAL FACTORS 1-2 comorbidities: Osteoporosis; bipolar disorder   are also affecting patient's functional outcome.    REHAB POTENTIAL: Excellent   CLINICAL DECISION MAKING: Stable/uncomplicated   EVALUATION COMPLEXITY: Low     GOALS: Goals reviewed with patient? No   SHORT TERM GOALS: Target date: 12/23/2021  Pt will be compliant and knowledgeable with initial HEP for improved comfort and carryover Baseline: initial HEP given Goal status: INITIAL   2.  Pt will self report right knee pain no greater than 6/10 for improved comfort and functional ability Baseline: 8/10 at worst Goal status: INITIAL   LONG TERM GOALS: Target date: 02/24/2022    Pt will self report right knee pain no greater than 1-2/10 for improved comfort and functional ability Baseline: 8/10 at worst Goal status: INITIAL   2.  Pt will improve FOTO function score to no less than 64% as proxy for functional improvement Baseline: 47%  function Goal status: INITIAL   3.  Pt will improve R knee AROM to range on 0-12 degrees for improved functional mobility and community navigation Baseline: see chart Goal status: INITIAL   4.  Pt will increase 30 Second Sit to Stand rep count to no less than 15 reps with no UE supportfor improved balance, strength, and functional mobility Baseline: 14 reps with bilateral UE support  Goal status: INITIAL    5.  Pt will be able to ambulate 1070f with LRAD for improved functional mobility and safety with community activites Baseline: unable Goal status: INITIAL   PLAN: PT FREQUENCY: 2x/week   PT DURATION: 12 weeks   PLANNED INTERVENTIONS: Therapeutic exercises, Therapeutic  activity, Neuromuscular re-education, Balance training, Gait training, Patient/Family education, Joint mobilization, Aquatic Therapy, Dry Needling, Electrical stimulation, Cryotherapy, Moist heat, Taping, Vasopneumatic device, Manual therapy, and Re-evaluation   PLAN FOR NEXT SESSION: assess HEP response, progress LE strength and R knee ROM    SEvelene Croon PTA 12/08/2021, 2:44 PM

## 2021-12-08 NOTE — Telephone Encounter (Signed)
Called and spoke with patient. Advised him that I have messaged Ryan and waiting for a response about what to do next.

## 2021-12-08 NOTE — Telephone Encounter (Signed)
Pt called and states that the pad on the ice machine blew up like a balloon and now he can't use it. He wants to know if he can get it replaced or fixed?   CB [phone]

## 2021-12-09 ENCOUNTER — Telehealth: Payer: Self-pay | Admitting: *Deleted

## 2021-12-09 ENCOUNTER — Telehealth: Payer: Self-pay

## 2021-12-09 NOTE — Telephone Encounter (Signed)
Pt called and ryan did not give his number to text a picture of the ice machine. PT is wondering if he can get then number

## 2021-12-09 NOTE — Telephone Encounter (Signed)
Attempted 30 day Ortho bundle call to patient. No answer and left VM requesting call back.

## 2021-12-09 NOTE — Telephone Encounter (Signed)
I have reached out to [place] so that he can contact patient again.

## 2021-12-09 NOTE — Telephone Encounter (Signed)
Referral received for home health services. Message sent to Dr Margarita Rana  informing her that he is going to outpatient PT so he is not considered homebound and would not qualify for home health services - nursing or PT.  I called patient # [phone] to inform him of above noted information. Message left with call back requested to this CM.  Also need to inquire if he is referring to [facility] through Medicaid instead of home home health. He would be able to receive PCS if he needs assistance with personal care

## 2021-12-13 NOTE — Therapy (Unsigned)
OUTPATIENT PHYSICAL THERAPY TREATMENT NOTE   Patient Name: Robert Rhodes MRN: [id number] DOB:December 30, 1945, 76 y.o., male Today's Date: 12/16/2021  PCP: [name], MD  REFERRING PROVIDER: Leandrew Koyanagi, MD  END OF SESSION:   PT End of Session - 12/16/21 1144     Number of Visits 24    Date for PT Re-Evaluation 02/24/22    Authorization Type [employer] MCR    PT Start Time 2130    PT Stop Time 1230    PT Time Calculation (min) 45 min    Activity Tolerance Patient tolerated treatment well              Past Medical History:  Diagnosis Date   Adenomatous colon polyp    Arthritis    bilateral knees-needs replacements   Claustrophobia    Colon cancer ([place]) 2015   COVID-19    Eye abnormalities    right eye seen in ED 02/17/2014 wearing eye patch using eye drops   GERD (gastroesophageal reflux disease)    with certain foods   Glaucoma    not on eye drops at this time (10/21/2020)   Hypertension    on meds   Irregular heartbeat    per pt/doesn't know what it does   Osteoarthritis    bilat knees  needs replacement   Osteoporosis    pt denies   Substance abuse ([place])    stopped over 3 years ago   UC (ulcerative colitis) ([place])    Vitamin D deficiency    Past Surgical History:  Procedure Laterality Date   COLON RESECTION N/A 08/23/2013   Procedure: Laparoscopic total abdominal colectomy and hernia repair;  Surgeon: Leighton Ruff, MD;  [facility];  Service: General;  Laterality: N/A;   COLON SURGERY  08/2013   COLONOSCOPY  11/02/2020   EUS N/A 07/18/2013   Procedure: UPPER ENDOSCOPIC ULTRASOUND (EUS) RADIAL;  Surgeon: Milus Banister, MD;  [facility];  Service: Endoscopy;  Laterality: N/A;   EYE SURGERY     FLEXIBLE SIGMOIDOSCOPY N/A 2020   prep good   HERNIA REPAIR  2005   In [place]   INCISIONAL HERNIA REPAIR N/A 02/20/2014   Procedure: LAP ASSISTED INCISIONAL HERNIA REPAIR LYSIS OF ADHESIONS;  Surgeon: Leighton Ruff, MD;  [facility];  Service: General;  Laterality: N/A;  converted to open @ [place] N/A 02/20/2014   Procedure: HERNIA REPAIR INCISIONAL;  Surgeon: Leighton Ruff, MD;  [facility];  Service: General;  Laterality: N/A;  With MESH   INTRAOCULAR LENS INSERTION Bilateral    6 yrs ago   TOTAL KNEE ARTHROPLASTY Right 11/11/2021   Procedure: RIGHT TOTAL KNEE ARTHROPLASTY;  Surgeon: Leandrew Koyanagi, MD;  Location: [place];  Service: Orthopedics;  Laterality: Right;   VENTRAL HERNIA REPAIR     Patient Active Problem List   Diagnosis Date Noted   Primary osteoarthritis of right knee 11/11/2021   Status post total right knee replacement 11/11/2021   AKI (acute kidney injury) ([place]) 04/21/2019   Pneumonia due to COVID-19 virus 04/21/2019   Osteoporosis    Ulcerative colitis, with rectal bleeding 08/06/2015   Gastroesophageal reflux disease without esophagitis 08/06/2015   Visual impairment of right eye 06/18/2015   Essential hypertension, benign 06/18/2015   Hernia of abdominal cavity 11/27/2014   Bipolar 1 disorder, mixed, moderate ([place]) 11/27/2014   Cellulitis 08/23/2014   Medically noncompliant 08/23/2014   Left leg cellulitis 08/23/2014  Cellulitis of left leg    Hypokalemia    Diarrhea 05/06/2014   Ulcerative colitis ([place]) 04/17/2014   Verrucous keratosis 04/17/2014   Nonspecific (abnormal) findings on radiological and other examination of gastrointestinal tract 07/18/2013   Acute esophagitis 07/18/2013   Hypovitaminosis D 05/06/2013   Ulcerative colitis, unspecified 04/04/2013   Knee pain, bilateral 03/05/2013    REFERRING DIAG: M09.470 (ICD-10-CM) - Status post total right knee replacement M17.11 (ICD-10-CM) - Primary osteoarthritis of right knee  THERAPY DIAG:  Acute pain of right knee  Muscle weakness (generalized)  Other abnormalities of gait and mobility  Rationale for Evaluation and Treatment Rehabilitation  PERTINENT HISTORY: Osteoporosis; bipolar disorder    PRECAUTIONS: None  ONSET DATE: 11/11/2021  SUBJECTIVE: Patient reports no current pain. He reports intermittent sharp pain with certain movements that instantly go away with repositioning.   PAIN:  Are you having pain?  Yes: NPRS scale: 0/10 (5/10 at worst) Pain location: R knee Pain description: stiff, sharp Aggravating factors: prolonged position Relieving factors: movement, ice  OBJECTIVE: (objective measures completed at initial evaluation unless otherwise dated)  DIAGNOSTIC FINDINGS:            See imaging   PATIENT SURVEYS:  FOTO: 47% function; 64% predicted    COGNITION:           Overall cognitive status: Within functional limits for tasks assessed                          SENSATION: Light touch: decreased light touch in lateral R tibaia   POSTURE:  rounded shoulders and forward head   PALPATION: Slight TTP to distal R quad   LOWER EXTREMITY ROM:      AROM Right 12/02/2021   Right 12/08/2021  Right  12/16/2021  Knee flexion 98  99 110  Knee extension 7  6 3                         (Blank rows = not tested)     LOWER EXTREMITY MMT:     MMT Right 12/02/2021  Left 12/02/2021   Hip flexion       Hip extension      Hip abduction      Hip adduction      Hip external rotation      Hip internal rotation      Knee extension DNT    Knee flexion DNT    Ankle dorsiflexion       Ankle plantarflexion      Ankle inversion      Ankle eversion      Grossly      (Blank rows = not tested)   FUNCTIONAL TESTS:  30 Second Sit to Stand: 14 reps with bilateral UE support   GAIT: Distance walked: 61f Assistive device utilized: WEnvironmental consultant- 2 wheeled Level of assistance: Complete Independence Comments: decreased gait speed, antalgic gait, decrease R knee ext   TOD[facility]S TREATMENT: [facility]                                                DATE: 07/062023 Therapeutic Exercise: NuStep for 4 min for increased tolerance to flexion/ extension.  Seated  hamstring stretch 2 x 30" R Seated heel slide x 10 - 5" hold R  LAQ 3" hold at top 2x10 Supine quad set 2 x 10 - R - 5" hold Supine SLR 2 x 10 R Short sitting with OP into flexion using L LE 2 x 10 - 5" hold R Manual Therapy: Short axis distraction with OP into flexion.  Manual flexion/extension stretch Therapeutic Activity: Gait [id number]' with and without [facility] Adult PT Treatment:                                                DATE: 12/08/2021 Therapeutic Exercise: Seated hamstring stretch 2 x 30" R Seated heel slide x 10 - 5" hold R LAQ 3" hold at top 2x10 Supine quad set 2 x 10 - R - 5" hold Supine SLR 2 x 10 R Supine heel slide 2 x 10 - 5" hold R Manual Therapy: AP mobs grade II-III Manual flexion/extension stretch Therapeutic Activity: Gait [phone]' with and without [facility]   [facility] Adult PT Treatment:                                                DATE: 12/02/2021 Therapeutic Exercise: Seated hamstring stretch x 30" R Seated heel slide x 10 - 5" hold R Supine quad set x 10 - R - 5" hold Supine SLR x 5 R Supine heel slide x 5 - 5" hold R   PATIENT EDUCATION:  Education details: eval findings, FOTO, HEP, POC Person educated: Patient Education method: Explanation, Demonstration, and Handouts Education comprehension: verbalized understanding and returned demonstration     HOME EXERCISE PROGRAM: Access Code: [email or web address] URL: [email or web address] Date: 12/02/2021 Prepared by: Octavio Manns   Exercises - Seated Hamstring Stretch  - 2-3 x daily - 7 x weekly - 2-3 reps - 30 sec hold - Seated Heel Slide  - 2-3 x daily - 7 x weekly - 2-3 sets - 10 reps - 5 hold - Supine Quad Set  - 2-3 x daily - 7 x weekly - 2-3 sets - 10 reps - Active Straight Leg Raise with Quad Set  - 2-3 x daily - 7 x weekly - 2-3 sets - 10 reps - Supine Heel Slide  - 2-3 x daily - 7 x weekly - 2-3 sets - 10 reps - 5 sec hold   ASSESSMENT:   CLINICAL IMPRESSION: Patient presents to PT with no  pain. He reports that his CPM machine was removed last week, but he reporting being active with his HEP. Session today focused on Rt quad strengthening and knee ROM. Pt demonstrates improvements with ROM with flexion reaching 110 and extension lacking 3 degrees. Pt also demonstrates improvements with strength, however continues to be limited by fatigue. He is able to ambulate without AD with minimal fatigue and pain after. Patient was able to tolerate all prescribed exercises with no adverse effects. Patient continues to benefit from skilled PT services and should be progressed as able to improve functional independence.    OBJECTIVE IMPAIRMENTS Abnormal gait, decreased balance, decreased endurance, decreased mobility, difficulty walking, decreased ROM, decreased strength, and pain.    ACTIVITY LIMITATIONS carrying, lifting, squatting, stairs, and transfers   PARTICIPATION LIMITATIONS: meal prep, cleaning, and community activity   PERSONAL FACTORS 1-2 comorbidities: Osteoporosis; bipolar disorder   are  also affecting patient's functional outcome.    REHAB POTENTIAL: Excellent   CLINICAL DECISION MAKING: Stable/uncomplicated   EVALUATION COMPLEXITY: Low     GOALS: Goals reviewed with patient? No   SHORT TERM GOALS: Target date: 12/23/2021  Pt will be compliant and knowledgeable with initial HEP for improved comfort and carryover Baseline: initial HEP given Goal status: INITIAL   2.  Pt will self report right knee pain no greater than 6/10 for improved comfort and functional ability Baseline: 8/10 at worst Goal status: INITIAL   LONG TERM GOALS: Target date: 02/24/2022    Pt will self report right knee pain no greater than 1-2/10 for improved comfort and functional ability Baseline: 8/10 at worst Goal status: INITIAL   2.  Pt will improve FOTO function score to no less than 64% as proxy for functional improvement Baseline: 47% function Goal status: INITIAL   3.  Pt will improve R  knee AROM to range on 0-12 degrees for improved functional mobility and community navigation Baseline: see chart Goal status: INITIAL   4.  Pt will increase 30 Second Sit to Stand rep count to no less than 15 reps with no UE supportfor improved balance, strength, and functional mobility Baseline: 14 reps with bilateral UE support  Goal status: INITIAL    5.  Pt will be able to ambulate 1061f with LRAD for improved functional mobility and safety with community activites Baseline: unable Goal status: INITIAL   PLAN: PT FREQUENCY: 2x/week   PT DURATION: 12 weeks   PLANNED INTERVENTIONS: Therapeutic exercises, Therapeutic activity, Neuromuscular re-education, Balance training, Gait training, Patient/Family education, Joint mobilization, Aquatic Therapy, Dry Needling, Electrical stimulation, Cryotherapy, Moist heat, Taping, Vasopneumatic device, Manual therapy, and Re-evaluation   PLAN FOR NEXT SESSION: assess HEP response, progress LE strength and R knee ROM, Step up's.     SLynden Ang PT 12/16/2021, 11:50 AM

## 2021-12-16 ENCOUNTER — Encounter: Payer: Self-pay | Admitting: Physical Therapy

## 2021-12-16 ENCOUNTER — Ambulatory Visit: Payer: Medicare HMO | Attending: Orthopaedic Surgery | Admitting: Physical Therapy

## 2021-12-16 DIAGNOSIS — M6281 Muscle weakness (generalized): Secondary | ICD-10-CM

## 2021-12-16 DIAGNOSIS — R2689 Other abnormalities of gait and mobility: Secondary | ICD-10-CM | POA: Diagnosis not present

## 2021-12-16 DIAGNOSIS — M25561 Pain in right knee: Secondary | ICD-10-CM | POA: Diagnosis not present

## 2021-12-16 NOTE — Telephone Encounter (Signed)
I attempted to contact patient # [phone] to inform him  that since he is going to outpatient PT  he is not considered homebound and would not qualify for home health services - nursing or PT.Message left with call back requested to this CM.    I also wanted to inquire if he is in need of personal care services (PCS) through Medicaid instead of home home health. He would be able to receive PCS if he needs assistance with personal care

## 2021-12-20 ENCOUNTER — Ambulatory Visit: Payer: Medicare HMO | Admitting: Physical Therapy

## 2021-12-20 ENCOUNTER — Encounter: Payer: Self-pay | Admitting: Physical Therapy

## 2021-12-20 DIAGNOSIS — M25561 Pain in right knee: Secondary | ICD-10-CM | POA: Diagnosis not present

## 2021-12-20 DIAGNOSIS — M6281 Muscle weakness (generalized): Secondary | ICD-10-CM

## 2021-12-20 DIAGNOSIS — R2689 Other abnormalities of gait and mobility: Secondary | ICD-10-CM

## 2021-12-20 NOTE — Therapy (Signed)
OUTPATIENT PHYSICAL THERAPY TREATMENT NOTE   Patient Name: Robert Rhodes MRN: [id number] DOB:1945/10/30, 76 y.o., male Today's Date: 12/20/2021  PCP: [name], MD  REFERRING PROVIDER: Leandrew Koyanagi, MD  END OF SESSION:      Past Medical History:  Diagnosis Date   Adenomatous colon polyp    Arthritis    bilateral knees-needs replacements   Claustrophobia    Colon cancer ([place]) 2015   COVID-19    Eye abnormalities    right eye seen in ED 02/17/2014 wearing eye patch using eye drops   GERD (gastroesophageal reflux disease)    with certain foods   Glaucoma    not on eye drops at this time (10/21/2020)   Hypertension    on meds   Irregular heartbeat    per pt/doesn't know what it does   Osteoarthritis    bilat knees  needs replacement   Osteoporosis    pt denies   Substance abuse ([place])    stopped over 3 years ago   UC (ulcerative colitis) ([place])    Vitamin D deficiency    Past Surgical History:  Procedure Laterality Date   COLON RESECTION N/A 08/23/2013   Procedure: Laparoscopic total abdominal colectomy and hernia repair;  Surgeon: Leighton Ruff, MD;  [facility];  Service: General;  Laterality: N/A;   COLON SURGERY  08/2013   COLONOSCOPY  11/02/2020   EUS N/A 07/18/2013   Procedure: UPPER ENDOSCOPIC ULTRASOUND (EUS) RADIAL;  Surgeon: Milus Banister, MD;  [facility];  Service: Endoscopy;  Laterality: N/A;   EYE SURGERY     FLEXIBLE SIGMOIDOSCOPY N/A 2020   prep good   HERNIA REPAIR  2005   [place]   INCISIONAL HERNIA REPAIR N/A 02/20/2014   Procedure: LAP ASSISTED INCISIONAL HERNIA REPAIR LYSIS OF ADHESIONS;  Surgeon: Leighton Ruff, MD;  [facility];  Service: General;  Laterality: N/A;  converted to open @ [place] N/A 02/20/2014   Procedure: HERNIA REPAIR INCISIONAL;  Surgeon: Leighton Ruff, MD;  [facility];  Service: General;  Laterality: N/A;  With MESH   INTRAOCULAR LENS INSERTION Bilateral     6 yrs ago   TOTAL KNEE ARTHROPLASTY Right 11/11/2021   Procedure: RIGHT TOTAL KNEE ARTHROPLASTY;  Surgeon: Leandrew Koyanagi, MD;  Location: [place];  Service: Orthopedics;  Laterality: Right;   VENTRAL HERNIA REPAIR     Patient Active Problem List   Diagnosis Date Noted   Primary osteoarthritis of right knee 11/11/2021   Status post total right knee replacement 11/11/2021   AKI (acute kidney injury) ([place]) 04/21/2019   Pneumonia due to COVID-19 virus 04/21/2019   Osteoporosis    Ulcerative colitis, with rectal bleeding 08/06/2015   Gastroesophageal reflux disease without esophagitis 08/06/2015   Visual impairment of right eye 06/18/2015   Essential hypertension, benign 06/18/2015   Hernia of abdominal cavity 11/27/2014   Bipolar 1 disorder, mixed, moderate ([place]) 11/27/2014   Cellulitis 08/23/2014   Medically noncompliant 08/23/2014   Left leg cellulitis 08/23/2014   Cellulitis of left leg    Hypokalemia    Diarrhea 05/06/2014   Ulcerative colitis ([place]) 04/17/2014   Verrucous keratosis 04/17/2014   Nonspecific (abnormal) findings on radiological and other examination of gastrointestinal tract 07/18/2013   Acute esophagitis 07/18/2013   Hypovitaminosis D 05/06/2013   Ulcerative colitis, unspecified 04/04/2013   Knee pain, bilateral 03/05/2013    REFERRING DIAG: Z96.651 (ICD-10-CM) - Status post total right  knee replacement M17.11 (ICD-10-CM) - Primary osteoarthritis of right knee  THERAPY DIAG:  No diagnosis found.  Rationale for Evaluation and Treatment Rehabilitation  PERTINENT HISTORY: Osteoporosis; bipolar disorder   PRECAUTIONS: None  ONSET DATE: 11/11/2021  SUBJECTIVE: Patient reports stiffness in the morning that eases up with movement throughout the day. He denies any notable pain.   PAIN:  Are you having pain?  Yes: NPRS scale: 0/10 (5/10 at worst) Pain location: R knee Pain description: stiff, sharp Aggravating factors: prolonged position Relieving factors:  movement, ice  OBJECTIVE: (objective measures completed at initial evaluation unless otherwise dated)  DIAGNOSTIC FINDINGS:            See imaging   PATIENT SURVEYS:  FOTO: 47% function; 64% predicted    COGNITION:           Overall cognitive status: Within functional limits for tasks assessed                          SENSATION: Light touch: decreased light touch in lateral R tibaia   POSTURE:  rounded shoulders and forward head   PALPATION: Slight TTP to distal R quad   LOWER EXTREMITY ROM:      AROM Right 12/02/2021   Right 12/08/2021  Right  12/16/2021 Right 12/20/2021   Knee flexion 98  99 110 130  Knee extension 7  6 3 3                         (Blank rows = not tested)     LOWER EXTREMITY MMT:     MMT Right 12/02/2021  Left 12/02/2021   Hip flexion       Hip extension      Hip abduction      Hip adduction      Hip external rotation      Hip internal rotation      Knee extension DNT    Knee flexion DNT    Ankle dorsiflexion       Ankle plantarflexion      Ankle inversion      Ankle eversion      Grossly      (Blank rows = not tested)   FUNCTIONAL TESTS:  30 Second Sit to Stand: 14 reps with bilateral UE support   GAIT: Distance walked: 61f Assistive device utilized: WEnvironmental consultant- 2 wheeled Level of assistance: Complete Independence Comments: decreased gait speed, antalgic gait, decrease R knee ext   TODAY'S TREATMENT: [facility] Adult PT Treatment:                                                DATE: 12/20/2021 Therapeutic Exercise: NuStep for 4 min lvl 4.  Sustained hamstring stretch with 7.5b weight for OP 1 min x 2  Short sitting with OP into flexion using L LE 2 x 10 - 5" hold  Step up 6" step R LE lead  2 x 10  Lateral step up 6" step R LE lead  2 x 10  Manual Therapy:[p Short axis distraction with OP into flexion.  PA glides   Neuro:  Heel taps 2" step 2 x 10  LAQ 3" hold at top 2x10 with 2#  [facility]  [facility]: [facility]23 Therapeutic Exercise: NuStep for 4 min for increased tolerance to flexion/ extension.  Seated hamstring stretch 2 x 30" R Seated heel slide x 10 - 5" hold R LAQ 3" hold at top 2x10 Supine quad set 2 x 10 - R - 5" hold Supine SLR 2 x 10 R Short sitting with OP into flexion using L LE 2 x 10 - 5" hold R Gait x200' with and without SPC Manual Therapy: Short axis distraction with OP into flexion.  Manual flexion/extension stretch Therapeutic Activity: Gait [id number]' with and without [facility] Adult PT Treatment:                                                DATE: 12/08/2021 Therapeutic Exercise: Seated hamstring stretch 2 x 30" R Seated heel slide x 10 - 5" hold R LAQ 3" hold at top 2x10 Supine quad set 2 x 10 - R - 5" hold Supine SLR 2 x 10 R Supine heel slide 2 x 10 - 5" hold R Manual Therapy: AP mobs grade II-III Manual flexion/extension stretch Therapeutic Activity: Gait [phone]' with and without [facility]   [facility] Adult PT Treatment:                                                DATE: 12/02/2021 Therapeutic Exercise: Seated hamstring stretch x 30" R Seated heel slide x 10 - 5" hold R Supine quad set x 10 - R - 5" hold Supine SLR x 5 R Supine heel slide x 5 - 5" hold R   PATIENT EDUCATION:  Education details: eval findings, FOTO, HEP, POC Person educated: Patient Education method: Explanation, Demonstration, and Handouts Education comprehension: verbalized understanding and returned demonstration     HOME EXERCISE PROGRAM: Access Code: [email or web address] URL: [email or web address] Date: 12/02/2021 Prepared by: Octavio Manns   Exercises - Seated Hamstring Stretch  - 2-3 x daily - 7 x weekly - 2-3 reps - 30 sec hold - Seated Heel Slide  - 2-3 x daily - 7 x weekly - 2-3 sets - 10 reps - 5 hold - Supine Quad Set  - 2-3 x daily - 7 x weekly - 2-3 sets - 10 reps - Active Straight Leg Raise with Quad Set  - 2-3 x daily - 7 x weekly - 2-3 sets - 10  reps - Supine Heel Slide  - 2-3 x daily - 7 x weekly - 2-3 sets - 10 reps - 5 sec hold   ASSESSMENT:   CLINICAL IMPRESSION: Patient presents to PT with only stiffness noted. He reports compliance with his HEP and walking throughout the day to help with mobility. Pt able to complete all progressions made today. He requires intermittent rest breaks between sets due to reported fatigue. Pt demonstrates good quad control with heel taps today. His ROM continues to show improvements with flexion WFL. Pt will continue to benefit from skilled PT to address continues weakness and gait mechanics for functional independence.    OBJECTIVE IMPAIRMENTS Abnormal gait, decreased balance, decreased endurance, decreased mobility, difficulty walking, decreased ROM, decreased strength, and pain.    ACTIVITY LIMITATIONS carrying, lifting, squatting, stairs, and transfers   PARTICIPATION LIMITATIONS: meal prep, cleaning, and community activity  PERSONAL FACTORS 1-2 comorbidities: Osteoporosis; bipolar disorder   are also affecting patient's functional outcome.    REHAB POTENTIAL: Excellent   CLINICAL DECISION MAKING: Stable/uncomplicated   EVALUATION COMPLEXITY: Low     GOALS: Goals reviewed with patient? No   SHORT TERM GOALS: Target date: 12/23/2021  Pt will be compliant and knowledgeable with initial HEP for improved comfort and carryover Baseline: initial HEP given Goal status: INITIAL   2.  Pt will self report right knee pain no greater than 6/10 for improved comfort and functional ability Baseline: 8/10 at worst Goal status: INITIAL   LONG TERM GOALS: Target date: 02/24/2022    Pt will self report right knee pain no greater than 1-2/10 for improved comfort and functional ability Baseline: 8/10 at worst Goal status: INITIAL   2.  Pt will improve FOTO function score to no less than 64% as proxy for functional improvement Baseline: 47% function Goal status: INITIAL   3.  Pt will improve R  knee AROM to range on 0-12 degrees for improved functional mobility and community navigation Baseline: see chart Goal status: INITIAL   4.  Pt will increase 30 Second Sit to Stand rep count to no less than 15 reps with no UE supportfor improved balance, strength, and functional mobility Baseline: 14 reps with bilateral UE support  Goal status: INITIAL    5.  Pt will be able to ambulate 1018f with LRAD for improved functional mobility and safety with community activites Baseline: unable Goal status: INITIAL   PLAN: PT FREQUENCY: 2x/week   PT DURATION: 12 weeks   PLANNED INTERVENTIONS: Therapeutic exercises, Therapeutic activity, Neuromuscular re-education, Balance training, Gait training, Patient/Family education, Joint mobilization, Aquatic Therapy, Dry Needling, Electrical stimulation, Cryotherapy, Moist heat, Taping, Vasopneumatic device, Manual therapy, and Re-evaluation   PLAN FOR NEXT SESSION: assess HEP response, progress LE strength and R knee ROM, Step up's.     SLynden Ang PT 12/20/2021, 11:44 AM

## 2021-12-21 NOTE — Telephone Encounter (Signed)
I spoke to the patient and he said he is doing great. He explained that he only needed the home therapy when he first came home after his surgery., not now.  He has now progressed well with outpatient PT.   He also said he did not need assistance with personal care. Again, when he first came home from the hospital he needed help but not any longer.

## 2021-12-22 ENCOUNTER — Ambulatory Visit: Payer: Medicare HMO

## 2021-12-22 ENCOUNTER — Ambulatory Visit: Payer: Medicare HMO | Admitting: Family Medicine

## 2021-12-22 DIAGNOSIS — R2689 Other abnormalities of gait and mobility: Secondary | ICD-10-CM | POA: Diagnosis not present

## 2021-12-22 DIAGNOSIS — M6281 Muscle weakness (generalized): Secondary | ICD-10-CM | POA: Diagnosis not present

## 2021-12-22 DIAGNOSIS — M25561 Pain in right knee: Secondary | ICD-10-CM

## 2021-12-22 NOTE — Therapy (Signed)
OUTPATIENT PHYSICAL THERAPY TREATMENT NOTE   Patient Name: Robert Rhodes MRN: [id number] DOB:[birth year], 76 y.o., male Today's Date: 12/22/2021  PCP: [name], MD  REFERRING PROVIDER: Leandrew Koyanagi, MD  END OF SESSION:   PT End of Session - 12/22/21 1255     Visit Number 5    Number of Visits 24    Date for PT Re-Evaluation 02/24/22    Authorization Type [employer] MCR    PT Start Time 1255    PT Stop Time 1335    PT Time Calculation (min) 40 min    Activity Tolerance Patient tolerated treatment well    Behavior During Therapy WFL for tasks assessed/performed               Past Medical History:  Diagnosis Date   Adenomatous colon polyp    Arthritis    bilateral knees-needs replacements   Claustrophobia    Colon cancer ([place]) 2015   COVID-19    Eye abnormalities    right eye seen in ED 02/17/2014 wearing eye patch using eye drops   GERD (gastroesophageal reflux disease)    with certain foods   Glaucoma    not on eye drops at this time (10/21/2020)   Hypertension    on meds   Irregular heartbeat    per pt/doesn't know what it does   Osteoarthritis    bilat knees  needs replacement   Osteoporosis    pt denies   Substance abuse ([place])    stopped over 3 years ago   UC (ulcerative colitis) ([place])    Vitamin D deficiency    Past Surgical History:  Procedure Laterality Date   COLON RESECTION N/A 08/23/2013   Procedure: Laparoscopic total abdominal colectomy and hernia repair;  Surgeon: Leighton Ruff, MD;  [facility]: [facility];  Service: General;  Laterality: N/A;   COLON SURGERY  08/2013   COLONOSCOPY  11/02/2020   EUS N/A 07/18/2013   Procedure: UPPER ENDOSCOPIC ULTRASOUND (EUS) RADIAL;  Surgeon: Milus Banister, MD;  [facility];  Service: Endoscopy;  Laterality: N/A;   EYE SURGERY     FLEXIBLE SIGMOIDOSCOPY N/A 2020   prep good   HERNIA REPAIR  2005   In [place]   INCISIONAL HERNIA REPAIR N/A 02/20/2014   Procedure: LAP ASSISTED  INCISIONAL HERNIA REPAIR LYSIS OF ADHESIONS;  Surgeon: Leighton Ruff, MD;  [facility];  Service: General;  Laterality: N/A;  converted to open @ [place] N/A 02/20/2014   Procedure: HERNIA REPAIR INCISIONAL;  Surgeon: Leighton Ruff, MD;  [facility];  Service: General;  Laterality: N/A;  With MESH   INTRAOCULAR LENS INSERTION Bilateral    6 yrs ago   TOTAL KNEE ARTHROPLASTY Right 11/11/2021   Procedure: RIGHT TOTAL KNEE ARTHROPLASTY;  Surgeon: Leandrew Koyanagi, MD;  Location: [facility];  Service: Orthopedics;  Laterality: Right;   VENTRAL HERNIA REPAIR     Patient Active Problem List   Diagnosis Date Noted   Primary osteoarthritis of right knee 11/11/2021   Status post total right knee replacement 11/11/2021   AKI (acute kidney injury) ([place]) 04/21/2019   Pneumonia due to COVID-19 virus 04/21/2019   Osteoporosis    Ulcerative colitis, with rectal bleeding 08/06/2015   Gastroesophageal reflux disease without esophagitis 08/06/2015   Visual impairment of right eye 06/18/2015   Essential hypertension, benign 06/18/2015   Hernia of abdominal cavity 11/27/2014   Bipolar 1 disorder, mixed, moderate ([place]) 11/27/2014  Cellulitis 08/23/2014   Medically noncompliant 08/23/2014   Left leg cellulitis 08/23/2014   Cellulitis of left leg    Hypokalemia    Diarrhea 05/06/2014   Ulcerative colitis ([place]) 04/17/2014   Verrucous keratosis 04/17/2014   Nonspecific (abnormal) findings on radiological and other examination of gastrointestinal tract 07/18/2013   Acute esophagitis 07/18/2013   Hypovitaminosis D 05/06/2013   Ulcerative colitis, unspecified 04/04/2013   Knee pain, bilateral 03/05/2013    REFERRING DIAG: Z96.651 (ICD-10-CM) - Status post total right knee replacement M17.11 (ICD-10-CM) - Primary osteoarthritis of right knee  THERAPY DIAG:  Acute pain of right knee  Muscle weakness (generalized)  Other abnormalities of gait and mobility  Rationale for  Evaluation and Treatment Rehabilitation  PERTINENT HISTORY: Osteoporosis; bipolar disorder   PRECAUTIONS: None  ONSET DATE: 11/11/2021  SUBJECTIVE: Patient reports stiffness in the morning that eases up with movement throughout the day. He denies any notable pain.   PAIN:  Are you having pain?  Yes: NPRS scale: 0/10 (5/10 at worst) Pain location: R knee Pain description: stiff, sharp Aggravating factors: prolonged position Relieving factors: movement, ice  OBJECTIVE: (objective measures completed at initial evaluation unless otherwise dated)  DIAGNOSTIC FINDINGS:            See imaging   PATIENT SURVEYS:  FOTO: 47% function; 64% predicted    COGNITION:           Overall cognitive status: Within functional limits for tasks assessed                          SENSATION: Light touch: decreased light touch in lateral R tibaia   POSTURE:  rounded shoulders and forward head   PALPATION: Slight TTP to distal R quad   LOWER EXTREMITY ROM:      AROM Right 12/02/2021   Right 12/08/2021  Right  12/16/2021 Right 12/20/2021  Right 12/22/2021  Knee flexion 98  99 110 130 108  Knee extension 7  6 3 3 3                         (Blank rows = not tested)     LOWER EXTREMITY MMT:     MMT Right 12/02/2021  Left 12/02/2021   Hip flexion       Hip extension      Hip abduction      Hip adduction      Hip external rotation      Hip internal rotation      Knee extension DNT    Knee flexion DNT    Ankle dorsiflexion       Ankle plantarflexion      Ankle inversion      Ankle eversion      Grossly      (Blank rows = not tested)   FUNCTIONAL TESTS:  30 Second Sit to Stand: 14 reps with bilateral UE support   GAIT: Distance walked: 12f Assistive device utilized: WEnvironmental consultant- 2 wheeled Level of assistance: Complete Independence Comments: decreased gait speed, antalgic gait, decrease R knee ext   TODAY'S TREATMENT: [facility] Adult PT Treatment:                                                 DATE: 12/22/2021 Therapeutic Exercise: NuStep level 5  x 5 mins Sustained hamstring stretch with 7.5b weight for OP 1 min Short sitting with OP into flexion using L LE 2 x 10 - 5" hold  Step up 6" step R LE lead  2 x 10  Lateral step up 6" step R LE lead  2 x 10  Slant board gastroc stretch 1' Heel taps 2" step 2 x 10  LAQ 3" hold at top 2x10 with 2.5#  Manual Therapy: PA glides  Neuro:  Romberg stance 30" EO/EC Semi tandem stance x30" BIL Romberg stance on airex x30"  [facility] Adult PT Treatment:                                                DATE: 12/20/2021 Therapeutic Exercise: NuStep for 4 min lvl 4.  Sustained hamstring stretch with 7.5b weight for OP 1 min x 2  Short sitting with OP into flexion using L LE 2 x 10 - 5" hold  Step up 6" step R LE lead  2 x 10  Lateral step up 6" step R LE lead  2 x 10  Manual Therapy:[p Short axis distraction with OP into flexion.  PA glides   Neuro:  Heel taps 2" step 2 x 10  LAQ 3" hold at top 2x10 with 2#  [facility] Adult PT Treatment:                                                DATE: 07/062023 Therapeutic Exercise: NuStep for 4 min for increased tolerance to flexion/ extension.  Seated hamstring stretch 2 x 30" R Seated heel slide x 10 - 5" hold R LAQ 3" hold at top 2x10 Supine quad set 2 x 10 - R - 5" hold Supine SLR 2 x 10 R Short sitting with OP into flexion using L LE 2 x 10 - 5" hold R Gait x200' with and without SPC Manual Therapy: Short axis distraction with OP into flexion.  Manual flexion/extension stretch Therapeutic Activity: Gait [phone] with and without [facility]    PATIENT EDUCATION:  Education details: eval findings, FOTO, HEP, POC Person educated: Patient Education method: Explanation, Demonstration, and Handouts Education comprehension: verbalized understanding and returned demonstration     HOME EXERCISE PROGRAM: Access Code: [email or web address] URL: [email or web address] Date: 12/02/2021 Prepared by: Octavio Manns   Exercises - Seated Hamstring Stretch  - 2-3 x daily - 7 x weekly - 2-3 reps - 30 sec hold - Seated Heel Slide  - 2-3 x daily - 7 x weekly - 2-3 sets - 10 reps - 5 hold - Supine Quad Set  - 2-3 x daily - 7 x weekly - 2-3 sets - 10 reps - Active Straight Leg Raise with Quad Set  - 2-3 x daily - 7 x weekly - 2-3 sets - 10 reps - Supine Heel Slide  - 2-3 x daily - 7 x weekly - 2-3 sets - 10 reps - 5 sec hold   ASSESSMENT:   CLINICAL IMPRESSION: Patient presents to PT stating no current pain, just stiffness in the right knee. Session today focused on quad strengthening and knee ROM with 2-108 AROM by end of session. Patient was able to tolerate all prescribed exercises with no  adverse effects. Patient continues to benefit from skilled PT services and should be progressed as able to improve functional independence.     OBJECTIVE IMPAIRMENTS Abnormal gait, decreased balance, decreased endurance, decreased mobility, difficulty walking, decreased ROM, decreased strength, and pain.    ACTIVITY LIMITATIONS carrying, lifting, squatting, stairs, and transfers   PARTICIPATION LIMITATIONS: meal prep, cleaning, and community activity   PERSONAL FACTORS 1-2 comorbidities: Osteoporosis; bipolar disorder   are also affecting patient's functional outcome.    REHAB POTENTIAL: Excellent   CLINICAL DECISION MAKING: Stable/uncomplicated   EVALUATION COMPLEXITY: Low     GOALS: Goals reviewed with patient? No   SHORT TERM GOALS: Target date: 12/23/2021  Pt will be compliant and knowledgeable with initial HEP for improved comfort and carryover Baseline: initial HEP given Goal status: INITIAL   2.  Pt will self report right knee pain no greater than 6/10 for improved comfort and functional ability Baseline: 8/10 at worst Goal status: INITIAL   LONG TERM GOALS: Target date: 02/24/2022    Pt will self report right knee pain no greater than 1-2/10 for improved comfort and functional  ability Baseline: 8/10 at worst Goal status: INITIAL   2.  Pt will improve FOTO function score to no less than 64% as proxy for functional improvement Baseline: 47% function Goal status: INITIAL   3.  Pt will improve R knee AROM to range on 0-12 degrees for improved functional mobility and community navigation Baseline: see chart Goal status: INITIAL   4.  Pt will increase 30 Second Sit to Stand rep count to no less than 15 reps with no UE supportfor improved balance, strength, and functional mobility Baseline: 14 reps with bilateral UE support  Goal status: INITIAL    5.  Pt will be able to ambulate 1069f with LRAD for improved functional mobility and safety with community activites Baseline: unable Goal status: INITIAL   PLAN: PT FREQUENCY: 2x/week   PT DURATION: 12 weeks   PLANNED INTERVENTIONS: Therapeutic exercises, Therapeutic activity, Neuromuscular re-education, Balance training, Gait training, Patient/Family education, Joint mobilization, Aquatic Therapy, Dry Needling, Electrical stimulation, Cryotherapy, Moist heat, Taping, Vasopneumatic device, Manual therapy, and Re-evaluation   PLAN FOR NEXT SESSION: assess HEP response, progress LE strength and R knee ROM, Step up's.     SEvelene Croon PTA 12/22/2021, 1:36 PM

## 2021-12-23 NOTE — Therapy (Signed)
OUTPATIENT PHYSICAL THERAPY TREATMENT NOTE   Patient Name: Robert Rhodes MRN: [id number] DOB:Sep 09, 1945, 76 y.o., male Today's Date: 12/27/2021  PCP: [name], MD  REFERRING PROVIDER: Leandrew Koyanagi, MD  END OF SESSION:   PT End of Session - 12/27/21 1332     Visit Number 6    Number of Visits 24    Date for PT Re-Evaluation 02/24/22    Authorization Type [employer] MCR    Authorization - Visit Number 6    Authorization - Number of Visits 12    PT Start Time 7564    PT Stop Time 1412    PT Time Calculation (min) 44 min    Activity Tolerance Patient tolerated treatment well    Behavior During Therapy WFL for tasks assessed/performed                Past Medical History:  Diagnosis Date   Adenomatous colon polyp    Arthritis    bilateral knees-needs replacements   Claustrophobia    Colon cancer ([place]) 2015   COVID-19    Eye abnormalities    right eye seen in ED 02/17/2014 wearing eye patch using eye drops   GERD (gastroesophageal reflux disease)    with certain foods   Glaucoma    not on eye drops at this time (10/21/2020)   Hypertension    on meds   Irregular heartbeat    per pt/doesn't know what it does   Osteoarthritis    bilat knees  needs replacement   Osteoporosis    pt denies   Substance abuse ([place])    stopped over 3 years ago   UC (ulcerative colitis) ([place])    Vitamin D deficiency    Past Surgical History:  Procedure Laterality Date   COLON RESECTION N/A 08/23/2013   Procedure: Laparoscopic total abdominal colectomy and hernia repair;  Surgeon: Leighton Ruff, MD;  [facility];  Service: General;  Laterality: N/A;   COLON SURGERY  08/2013   COLONOSCOPY  11/02/2020   EUS N/A 07/18/2013   Procedure: UPPER ENDOSCOPIC ULTRASOUND (EUS) RADIAL;  Surgeon: Milus Banister, MD;  [facility];  Service: Endoscopy;  Laterality: N/A;   EYE SURGERY     FLEXIBLE SIGMOIDOSCOPY N/A 2020   prep good   HERNIA REPAIR  2005   [place] in [place]    INCISIONAL HERNIA REPAIR N/A 02/20/2014   Procedure: LAP ASSISTED INCISIONAL HERNIA REPAIR LYSIS OF ADHESIONS;  Surgeon: Leighton Ruff, MD;  [facility];  Service: General;  Laterality: N/A;  converted to open @ [place] N/A 02/20/2014   Procedure: HERNIA REPAIR INCISIONAL;  Surgeon: Leighton Ruff, MD;  [facility];  Service: General;  Laterality: N/A;  With MESH   INTRAOCULAR LENS INSERTION Bilateral    6 yrs ago   TOTAL KNEE ARTHROPLASTY Right 11/11/2021   Procedure: RIGHT TOTAL KNEE ARTHROPLASTY;  Surgeon: Leandrew Koyanagi, MD;  Location: [place];  Service: Orthopedics;  Laterality: Right;   VENTRAL HERNIA REPAIR     Patient Active Problem List   Diagnosis Date Noted   Primary osteoarthritis of right knee 11/11/2021   Status post total right knee replacement 11/11/2021   AKI (acute kidney injury) ([place]) 04/21/2019   Pneumonia due to COVID-19 virus 04/21/2019   Osteoporosis    Ulcerative colitis, with rectal bleeding 08/06/2015   Gastroesophageal reflux disease without esophagitis 08/06/2015   Visual impairment of right eye 06/18/2015   Essential hypertension,  benign 06/18/2015   Hernia of abdominal cavity 11/27/2014   Bipolar 1 disorder, mixed, moderate ([facility]) 11/27/2014   Cellulitis 08/23/2014   Medically noncompliant 08/23/2014   Left leg cellulitis 08/23/2014   Cellulitis of left leg    Hypokalemia    Diarrhea 05/06/2014   Ulcerative colitis ([place]) 04/17/2014   Verrucous keratosis 04/17/2014   Nonspecific (abnormal) findings on radiological and other examination of gastrointestinal tract 07/18/2013   Acute esophagitis 07/18/2013   Hypovitaminosis D 05/06/2013   Ulcerative colitis, unspecified 04/04/2013   Knee pain, bilateral 03/05/2013    REFERRING DIAG: Z96.651 (ICD-10-CM) - Status post total right knee replacement M17.11 (ICD-10-CM) - Primary osteoarthritis of right knee  THERAPY DIAG:  Acute pain of right knee  Muscle weakness  (generalized)  Other abnormalities of gait and mobility  Rationale for Evaluation and Treatment Rehabilitation  PERTINENT HISTORY: Osteoporosis; bipolar disorder   PRECAUTIONS: None  ONSET DATE: 11/11/2021  SUBJECTIVE: Patient reports slight improvements with stiffness upon waking.   PAIN:  Are you having pain?  Yes: NPRS scale: 0/10 (5/10 at worst) Pain location: R knee Pain description: stiff, sharp Aggravating factors: prolonged position Relieving factors: movement, ice  OBJECTIVE: (objective measures completed at initial evaluation unless otherwise dated)  DIAGNOSTIC FINDINGS:            See imaging   PATIENT SURVEYS:  FOTO: 47% function; 64% predicted    COGNITION:           Overall cognitive status: Within functional limits for tasks assessed                          SENSATION: Light touch: decreased light touch in lateral R tibaia   POSTURE:  rounded shoulders and forward head   PALPATION: Slight TTP to distal R quad   LOWER EXTREMITY ROM:      AROM Right 12/02/2021   Right 12/08/2021  Right  12/16/2021 Right 12/20/2021  Right 12/22/2021  Knee flexion 98  99 110 130 108  Knee extension _0 (Blank rows = not tested)     LOWER EXTREMITY MMT:     MMT Right 12/02/2021  Left 12/02/2021   Hip flexion       Hip extension      Hip abduction      Hip adduction      Hip external rotation      Hip internal rotation      Knee extension DNT    Knee flexion DNT    Ankle dorsiflexion       Ankle plantarflexion      Ankle inversion      Ankle eversion      Grossly      (Blank rows = not tested)   FUNCTIONAL TESTS:  30 Second Sit to Stand: 14 reps with bilateral UE support   GAIT: Distance walked: 46f Assistive device utilized: WEnvironmental consultant- 2 wheeled Level of assistance: Complete Independence Comments: decreased gait speed, antalgic gait, decrease R knee ext   TODAY'S TREATMENT: [facility] Adult PT Treatment:  DATE: 12/27/2021 Therapeutic Exercise: NuStep level 5 x 5 mins Slant board gastroc stretch 1' Sit to stand 10 x on airex.  Step up 6" step R LE lead  2 x 10  Lateral step up 6" step R LE lead  2 x 10  Manual Therapy: STM to calf with increased tension noted in lateral gastoc head.  Short sitting with OP into flexion using L LE 2 x 10 - 5" hold  Neuro:  Heel taps 2" step 2 x 10  TKE against BTB 2 x 20 [facility] Adult PT Treatment:                                                DATE: 12/22/2021 Therapeutic Exercise: NuStep level 5 x 5 mins Sustained hamstring stretch with 7.5b weight for OP 1 min Short sitting with OP into flexion using L LE 2 x 10 - 5" hold  Step up 6" step R LE lead  2 x 10  Lateral step up 6" step R LE lead  2 x 10  Slant board gastroc stretch 1' Heel taps 2" step 2 x 10  LAQ 3" hold at top 2x10 with 2.5#  Manual Therapy: PA glides  Neuro:  Romberg stance 30" EO/EC Semi tandem stance x30" BIL Romberg stance on airex x30"  [facility] Adult PT Treatment:                                                DATE: 12/20/2021 Therapeutic Exercise: NuStep for 4 min lvl 4.  Sustained hamstring stretch with 7.5b weight for OP 1 min x 2  Short sitting with OP into flexion using L LE 2 x 10 - 5" hold  Step up 6" step R LE lead  2 x 10  Lateral step up 6" step R LE lead  2 x 10  Manual Therapy:[p Short axis distraction with OP into flexion.  PA glides   Neuro:  Heel taps 2" step 2 x 10  LAQ 3" hold at top 2x10 with 2#  [facility] Adult PT Treatment:                                                DATE: 07/062023 Therapeutic Exercise: NuStep for 4 min for increased tolerance to flexion/ extension.  Seated hamstring stretch 2 x 30" R Seated heel slide x 10 - 5" hold R LAQ 3" hold at top 2x10 Supine quad set 2 x 10 - R - 5" hold Supine SLR 2 x 10 R Short sitting with OP into flexion using L LE 2 x 10 - 5" hold R Gait x200' with and without SPC Manual  Therapy: Short axis distraction with OP into flexion.  Manual flexion/extension stretch Therapeutic Activity: Gait [phone] with and without [facility]    PATIENT EDUCATION:  Education details: eval findings, FOTO, HEP, POC Person educated: Patient Education method: Explanation, Demonstration, and Handouts Education comprehension: verbalized understanding and returned demonstration     HOME EXERCISE PROGRAM: Access Code: [email or web address] URL: [email or web address] Date: 12/02/2021 Prepared by: Octavio Manns   Exercises - Seated  Hamstring Stretch  - 2-3 x daily - 7 x weekly - 2-3 reps - 30 sec hold - Seated Heel Slide  - 2-3 x daily - 7 x weekly - 2-3 sets - 10 reps - 5 hold - Supine Quad Set  - 2-3 x daily - 7 x weekly - 2-3 sets - 10 reps - Active Straight Leg Raise with Quad Set  - 2-3 x daily - 7 x weekly - 2-3 sets - 10 reps - Supine Heel Slide  - 2-3 x daily - 7 x weekly - 2-3 sets - 10 reps - 5 sec hold   ASSESSMENT:   CLINICAL IMPRESSION: Patient presents to PT stating no current pain and min reported stiffness in his R knee. Session today focused on quad strengthening and knee ROM. Patient was able to tolerate all prescribed exercises with no adverse effects. Pt demonstrates increased fatigue today, requiring intermittent rest breaks between exercises. Pt with most limitations noted with eccentric quad control. Patient continues to benefit from skilled PT services and should be progressed as able to improve functional independence.     OBJECTIVE IMPAIRMENTS Abnormal gait, decreased balance, decreased endurance, decreased mobility, difficulty walking, decreased ROM, decreased strength, and pain.    ACTIVITY LIMITATIONS carrying, lifting, squatting, stairs, and transfers   PARTICIPATION LIMITATIONS: meal prep, cleaning, and community activity   PERSONAL FACTORS 1-2 comorbidities: Osteoporosis; bipolar disorder   are also affecting patient's functional outcome.    REHAB  POTENTIAL: Excellent   CLINICAL DECISION MAKING: Stable/uncomplicated   EVALUATION COMPLEXITY: Low     GOALS: Goals reviewed with patient? No   SHORT TERM GOALS: Target date: 12/23/2021  Pt will be compliant and knowledgeable with initial HEP for improved comfort and carryover Baseline: initial HEP given Goal status: MET 12/27/2021   2.  Pt will self report right knee pain no greater than 6/10 for improved comfort and functional ability Baseline: 8/10 at worst Goal status: MET 12/27/2021   LONG TERM GOALS: Target date: 02/24/2022    Pt will self report right knee pain no greater than 1-2/10 for improved comfort and functional ability Baseline: 8/10 at worst Goal status: INITIAL   2.  Pt will improve FOTO function score to no less than 64% as proxy for functional improvement Baseline: 47% function Goal status: INITIAL   3.  Pt will improve R knee AROM to range on 0-12 degrees for improved functional mobility and community navigation Baseline: see chart Goal status: INITIAL   4.  Pt will increase 30 Second Sit to Stand rep count to no less than 15 reps with no UE supportfor improved balance, strength, and functional mobility Baseline: 14 reps with bilateral UE support  Goal status: INITIAL    5.  Pt will be able to ambulate 1054f with LRAD for improved functional mobility and safety with community activites Baseline: unable Goal status: INITIAL   PLAN: PT FREQUENCY: 2x/week   PT DURATION: 12 weeks   PLANNED INTERVENTIONS: Therapeutic exercises, Therapeutic activity, Neuromuscular re-education, Balance training, Gait training, Patient/Family education, Joint mobilization, Aquatic Therapy, Dry Needling, Electrical stimulation, Cryotherapy, Moist heat, Taping, Vasopneumatic device, Manual therapy, and Re-evaluation   PLAN FOR NEXT SESSION: assess HEP response, progress LE strength and R knee ROM, Step up's.     SLynden Ang PT 12/27/2021, 1:33 PM

## 2021-12-24 ENCOUNTER — Ambulatory Visit (INDEPENDENT_AMBULATORY_CARE_PROVIDER_SITE_OTHER): Payer: Medicare HMO | Admitting: Physician Assistant

## 2021-12-24 ENCOUNTER — Encounter: Payer: Self-pay | Admitting: Orthopaedic Surgery

## 2021-12-24 ENCOUNTER — Telehealth: Payer: Self-pay

## 2021-12-24 ENCOUNTER — Ambulatory Visit (INDEPENDENT_AMBULATORY_CARE_PROVIDER_SITE_OTHER): Payer: Medicare HMO

## 2021-12-24 DIAGNOSIS — Z96651 Presence of right artificial knee joint: Secondary | ICD-10-CM

## 2021-12-24 NOTE — Telephone Encounter (Signed)
Patient would like to know if he is able to submerge his right knee in water or continue to shower and just let the water roll off?  Cb# [phone].  Please advise.  Thank you.

## 2021-12-24 NOTE — Progress Notes (Signed)
Post-Op Visit Note   Patient: Robert Rhodes           Date of Birth: [birth year]           MRN: [id number] Visit Date: 12/24/2021 PCP: [name], MD   Assessment & Plan:  Chief Complaint:  Chief Complaint  Patient presents with   Right Knee - Follow-up    Right total knee arthroplasty 11/11/2021   Visit Diagnoses:  1. Status post total right knee replacement     Plan: Patient is a very pleasant 76 year old gentleman who comes in today 6 weeks status post right total knee replacement 11/11/2021.  He has been doing great.  He is in no pain.  He is in physical therapy and working on a home exercise program.  Ambulating with a single-point cane.  He has been compliant taking his baby aspirin twice daily for DVT prophylaxis.  Examination of the right knee reveals a fully healed surgical incision without evidence of infection or cellulitis.  Calves are soft nontender.  He is neurovascular tact distally.  Range of motion 5 to 105 degrees.  Stable valgus varus stress.  At this point, he will continue with physical therapy.  He may discontinue his baby aspirin.  Dental prophylaxis reinforced.  Follow-up with [place] in 6 weeks time for recheck.  Call with concerns or questions.  Follow-Up Instructions: Return in about 6 weeks (around 02/04/2022).   Orders:  Orders Placed This Encounter  Procedures   XR Knee 1-2 Views Right   No orders of the defined types were placed in this encounter.   Imaging: XR Knee 1-2 Views Right  Result Date: 12/24/2021 Well-seated prosthesis without complication   PMFS History: Patient Active Problem List   Diagnosis Date Noted   Primary osteoarthritis of right knee 11/11/2021   Status post total right knee replacement 11/11/2021   AKI (acute kidney injury) ([place]) 04/21/2019   Pneumonia due to COVID-19 virus 04/21/2019   Osteoporosis    Ulcerative colitis, with rectal bleeding 08/06/2015   Gastroesophageal reflux disease without esophagitis 08/06/2015    Visual impairment of right eye 06/18/2015   Essential hypertension, benign 06/18/2015   Hernia of abdominal cavity 11/27/2014   Bipolar 1 disorder, mixed, moderate ([place]) 11/27/2014   Cellulitis 08/23/2014   Medically noncompliant 08/23/2014   Left leg cellulitis 08/23/2014   Cellulitis of left leg    Hypokalemia    Diarrhea 05/06/2014   Ulcerative colitis ([place]) 04/17/2014   Verrucous keratosis 04/17/2014   Nonspecific (abnormal) findings on radiological and other examination of gastrointestinal tract 07/18/2013   Acute esophagitis 07/18/2013   Hypovitaminosis D 05/06/2013   Ulcerative colitis, unspecified 04/04/2013   Knee pain, bilateral 03/05/2013   Past Medical History:  Diagnosis Date   Adenomatous colon polyp    Arthritis    bilateral knees-needs replacements   Claustrophobia    Colon cancer ([place]) 2015   COVID-19    Eye abnormalities    right eye seen in ED 02/17/2014 wearing eye patch using eye drops   GERD (gastroesophageal reflux disease)    with certain foods   Glaucoma    not on eye drops at this time (10/21/2020)   Hypertension    on meds   Irregular heartbeat    per pt/doesn't know what it does   Osteoarthritis    bilat knees  needs replacement   Osteoporosis    pt denies   Substance abuse ([place])    stopped over 3 years ago  UC (ulcerative colitis) ([place])    Vitamin D deficiency     Family History  Problem Relation Age of Onset   Cancer Mother        type unknown-in her leg   Diabetes Father    Heart disease Father    Heart disease Sister    Heart attack Brother    Healthy Sister    Colon cancer Neg Hx    Esophageal cancer Neg Hx    Rectal cancer Neg Hx    Stomach cancer Neg Hx    Colon polyps Neg Hx     Past Surgical History:  Procedure Laterality Date   COLON RESECTION N/A 08/23/2013   Procedure: Laparoscopic total abdominal colectomy and hernia repair;  Surgeon: Leighton Ruff, MD;  Location: [facility];  Service: General;  Laterality: N/A;    COLON SURGERY  08/2013   COLONOSCOPY  11/02/2020   EUS N/A 07/18/2013   Procedure: UPPER ENDOSCOPIC ULTRASOUND (EUS) RADIAL;  Surgeon: Milus Banister, MD;  Location: [facility];  Service: Endoscopy;  Laterality: N/A;   EYE SURGERY     FLEXIBLE SIGMOIDOSCOPY N/A 2020   prep good   HERNIA REPAIR  2005   In [place] in [place] mesh   INCISIONAL HERNIA REPAIR N/A 02/20/2014   Procedure: LAP ASSISTED INCISIONAL HERNIA REPAIR LYSIS OF ADHESIONS;  Surgeon: Leighton Ruff, MD;  Location: [facility];  Service: General;  Laterality: N/A;  converted to open @ [place] N/A 02/20/2014   Procedure: HERNIA REPAIR INCISIONAL;  Surgeon: Leighton Ruff, MD;  Location: [facility];  Service: General;  Laterality: N/A;  With MESH   INTRAOCULAR LENS INSERTION Bilateral    6 yrs ago   TOTAL KNEE ARTHROPLASTY Right 11/11/2021   Procedure: RIGHT TOTAL KNEE ARTHROPLASTY;  Surgeon: Leandrew Koyanagi, MD;  Location: [facility];  Service: Orthopedics;  Laterality: Right;   VENTRAL HERNIA REPAIR     Social History   Occupational History   Occupation: retired  Tobacco Use   Smoking status: Former    Packs/day: 0.00    Types: Cigarettes    Quit date: 06/14/2003    Years since quitting: 18.5   Smokeless tobacco: Never   Tobacco comments:    quit in July 2005  Vaping Use   Vaping Use: Never used  Substance and Sexual Activity   Alcohol use: No   Drug use: Not Currently    Types: 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

## 2021-12-24 NOTE — Telephone Encounter (Signed)
Just a shower.  No submerging yet.

## 2021-12-27 ENCOUNTER — Ambulatory Visit: Payer: Medicare HMO | Admitting: Physical Therapy

## 2021-12-27 ENCOUNTER — Encounter: Payer: Self-pay | Admitting: Physical Therapy

## 2021-12-27 DIAGNOSIS — M6281 Muscle weakness (generalized): Secondary | ICD-10-CM | POA: Diagnosis not present

## 2021-12-27 DIAGNOSIS — R2689 Other abnormalities of gait and mobility: Secondary | ICD-10-CM

## 2021-12-27 DIAGNOSIS — M25561 Pain in right knee: Secondary | ICD-10-CM | POA: Diagnosis not present

## 2021-12-27 NOTE — Telephone Encounter (Signed)
Called patient. No answer. LMOM that he should not submerge his leg yet. Only showers.

## 2021-12-29 ENCOUNTER — Telehealth: Payer: Self-pay | Admitting: *Deleted

## 2021-12-29 ENCOUNTER — Ambulatory Visit: Payer: Medicare HMO

## 2021-12-29 DIAGNOSIS — M25561 Pain in right knee: Secondary | ICD-10-CM

## 2021-12-29 DIAGNOSIS — M6281 Muscle weakness (generalized): Secondary | ICD-10-CM

## 2021-12-29 DIAGNOSIS — R2689 Other abnormalities of gait and mobility: Secondary | ICD-10-CM

## 2021-12-29 NOTE — Telephone Encounter (Signed)
Ortho bundle 30 day call attempted x 2. No answer and left VM requesting call back. Also attempted to see patient in office last week, but came in at least an hour before his scheduled time and missed him. Will re-attempt.

## 2021-12-29 NOTE — Therapy (Signed)
OUTPATIENT PHYSICAL THERAPY TREATMENT NOTE   Patient Name: Robert Rhodes MRN: [id number] DOB:Aug 20, 1945, 76 y.o., male Today's Date: 12/29/2021  PCP: [name], MD  REFERRING PROVIDER: Leandrew Koyanagi, MD  END OF SESSION:   PT End of Session - 12/29/21 1212     Visit Number 7    Number of Visits 24    Date for PT Re-Evaluation 02/24/22    Authorization Type [employer] MCR    Authorization - Visit Number 7    Authorization - Number of Visits 12    PT Start Time 3846    PT Stop Time 6599    PT Time Calculation (min) 40 min    Activity Tolerance Patient tolerated treatment well    Behavior During Therapy WFL for tasks assessed/performed                 Past Medical History:  Diagnosis Date   Adenomatous colon polyp    Arthritis    bilateral knees-needs replacements   Claustrophobia    Colon cancer ([place]) 2015   COVID-19    Eye abnormalities    right eye seen in ED 02/17/2014 wearing eye patch using eye drops   GERD (gastroesophageal reflux disease)    with certain foods   Glaucoma    not on eye drops at this time (10/21/2020)   Hypertension    on meds   Irregular heartbeat    per pt/doesn't know what it does   Osteoarthritis    bilat knees  needs replacement   Osteoporosis    pt denies   Substance abuse ([place])    stopped over 3 years ago   UC (ulcerative colitis) ([place])    Vitamin D deficiency    Past Surgical History:  Procedure Laterality Date   COLON RESECTION N/A 08/23/2013   Procedure: Laparoscopic total abdominal colectomy and hernia repair;  Surgeon: Leighton Ruff, MD;  [facility];  Service: General;  Laterality: N/A;   COLON SURGERY  08/2013   COLONOSCOPY  11/02/2020   EUS N/A 07/18/2013   Procedure: UPPER ENDOSCOPIC ULTRASOUND (EUS) RADIAL;  Surgeon: Milus Banister, MD;  [facility];  Service: Endoscopy;  Laterality: N/A;   EYE SURGERY     FLEXIBLE SIGMOIDOSCOPY N/A 2020   prep good   HERNIA REPAIR  2005   In [place]  mesh   INCISIONAL HERNIA REPAIR N/A 02/20/2014   Procedure: LAP ASSISTED INCISIONAL HERNIA REPAIR LYSIS OF ADHESIONS;  Surgeon: Leighton Ruff, MD;  [facility];  Service: General;  Laterality: N/A;  converted to open @ [place] N/A 02/20/2014   Procedure: HERNIA REPAIR INCISIONAL;  Surgeon: Leighton Ruff, MD;  [facility];  Service: General;  Laterality: N/A;  With MESH   INTRAOCULAR LENS INSERTION Bilateral    6 yrs ago   TOTAL KNEE ARTHROPLASTY Right 11/11/2021   Procedure: RIGHT TOTAL KNEE ARTHROPLASTY;  Surgeon: Leandrew Koyanagi, MD;  Location: [facility];  Service: Orthopedics;  Laterality: Right;   VENTRAL HERNIA REPAIR     Patient Active Problem List   Diagnosis Date Noted   Primary osteoarthritis of right knee 11/11/2021   Status post total right knee replacement 11/11/2021   AKI (acute kidney injury) ([place]) 04/21/2019   Pneumonia due to COVID-19 virus 04/21/2019   Osteoporosis    Ulcerative colitis, with rectal bleeding 08/06/2015   Gastroesophageal reflux disease without esophagitis 08/06/2015   Visual impairment of right eye 06/18/2015   Essential  hypertension, benign 06/18/2015   Hernia of abdominal cavity 11/27/2014   Bipolar 1 disorder, mixed, moderate ([facility]) 11/27/2014   Cellulitis 08/23/2014   Medically noncompliant 08/23/2014   Left leg cellulitis 08/23/2014   Cellulitis of left leg    Hypokalemia    Diarrhea 05/06/2014   Ulcerative colitis ([place]) 04/17/2014   Verrucous keratosis 04/17/2014   Nonspecific (abnormal) findings on radiological and other examination of gastrointestinal tract 07/18/2013   Acute esophagitis 07/18/2013   Hypovitaminosis D 05/06/2013   Ulcerative colitis, unspecified 04/04/2013   Knee pain, bilateral 03/05/2013    REFERRING DIAG: Z96.651 (ICD-10-CM) - Status post total right knee replacement M17.11 (ICD-10-CM) - Primary osteoarthritis of right knee  THERAPY DIAG:  Acute pain of right knee  Muscle weakness  (generalized)  Other abnormalities of gait and mobility  Rationale for Evaluation and Treatment Rehabilitation  PERTINENT HISTORY: Osteoporosis; bipolar disorder   PRECAUTIONS: None  ONSET DATE: 11/11/2021  SUBJECTIVE: Patient reports no current pain, just stiffness.  PAIN:  Are you having pain?  Yes: NPRS scale: 0/10 (5/10 at worst) Pain location: R knee Pain description: stiff, sharp Aggravating factors: prolonged position Relieving factors: movement, ice  OBJECTIVE: (objective measures completed at initial evaluation unless otherwise dated)  DIAGNOSTIC FINDINGS:            See imaging   PATIENT SURVEYS:  FOTO: 47% function; 64% predicted    COGNITION:           Overall cognitive status: Within functional limits for tasks assessed                          SENSATION: Light touch: decreased light touch in lateral R tibaia   POSTURE:  rounded shoulders and forward head   PALPATION: Slight TTP to distal R quad   LOWER EXTREMITY ROM:      AROM Right 12/02/2021   Right 12/08/2021  Right  12/16/2021 Right 12/20/2021  Right 12/22/2021  Knee flexion 98  99 110 130 108  Knee extension _0 (Blank rows = not tested)     LOWER EXTREMITY MMT:     MMT Right 12/02/2021  Left 12/02/2021   Hip flexion       Hip extension      Hip abduction      Hip adduction      Hip external rotation      Hip internal rotation      Knee extension DNT    Knee flexion DNT    Ankle dorsiflexion       Ankle plantarflexion      Ankle inversion      Ankle eversion      Grossly      (Blank rows = not tested)   FUNCTIONAL TESTS:  30 Second Sit to Stand: 14 reps with bilateral UE support   GAIT: Distance walked: 54f Assistive device utilized: WEnvironmental consultant- 2 wheeled Level of assistance: Complete Independence Comments: decreased gait speed, antalgic gait, decrease R knee ext   TODAY'S TREATMENT: [facility] Adult PT Treatment:  DATE: 12/29/2021 Therapeutic Exercise: NuStep level 5 x 5 mins Slant board gastroc stretch 1' Sit to stand 10 x on airex LAQ 4# 2x10 with 3" hold at top Step up 6" step R LE lead  2 x 10  Lateral step up 6" step R LE lead  2 x 10  Heel raises 2x10 Heel taps 2" step 2 x 10  TKE against BTB 2x10 Seated hamstring stretch 2x30" Rt Manual Therapy: AP mobs in supine Short sitting with OP into flexion using L LE 2 x 10 - 5" hold   [facility] Adult PT Treatment:                                                DATE: 12/27/2021 Therapeutic Exercise: NuStep level 5 x 5 mins Slant board gastroc stretch 1' Sit to stand 10 x on airex.  Step up 6" step R LE lead  2 x 10  Lateral step up 6" step R LE lead  2 x 10  Manual Therapy: STM to calf with increased tension noted in lateral gastoc head.  Short sitting with OP into flexion using L LE 2 x 10 - 5" hold  Neuro:  Heel taps 2" step 2 x 10  TKE against BTB 2 x 20  [facility] Adult PT Treatment:                                                DATE: 12/22/2021 Therapeutic Exercise: NuStep level 5 x 5 mins Sustained hamstring stretch with 7.5b weight for OP 1 min Short sitting with OP into flexion using L LE 2 x 10 - 5" hold  Step up 6" step R LE lead  2 x 10  Lateral step up 6" step R LE lead  2 x 10  Slant board gastroc stretch 1' Heel taps 2" step 2 x 10  LAQ 3" hold at top 2x10 with 2.5#  Manual Therapy: PA glides  Neuro:  Romberg stance 30" EO/EC Semi tandem stance x30" BIL Romberg stance on airex x30"  [facility] Adult PT Treatment:                                                DATE: 12/20/2021 Therapeutic Exercise: NuStep for 4 min lvl 4.  Sustained hamstring stretch with 7.5b weight for OP 1 min x 2  Short sitting with OP into flexion using L LE 2 x 10 - 5" hold  Step up 6" step R LE lead  2 x 10  Lateral step up 6" step R LE lead  2 x 10  Manual Therapy:[p Short axis distraction with OP into flexion.  PA glides   Neuro:  Heel  taps 2" step 2 x 10  LAQ 3" hold at top 2x10 with 2#     PATIENT EDUCATION:  Education details: eval findings, FOTO, HEP, POC Person educated: Patient Education method: Explanation, Demonstration, and Handouts Education comprehension: verbalized understanding and returned demonstration     HOME EXERCISE PROGRAM: Access Code: [email or web address] URL: [email or web address] Date: 12/02/2021 Prepared by: Octavio Manns   Exercises -  Seated Hamstring Stretch  - 2-3 x daily - 7 x weekly - 2-3 reps - 30 sec hold - Seated Heel Slide  - 2-3 x daily - 7 x weekly - 2-3 sets - 10 reps - 5 hold - Supine Quad Set  - 2-3 x daily - 7 x weekly - 2-3 sets - 10 reps - Active Straight Leg Raise with Quad Set  - 2-3 x daily - 7 x weekly - 2-3 sets - 10 reps - Supine Heel Slide  - 2-3 x daily - 7 x weekly - 2-3 sets - 10 reps - 5 sec hold   ASSESSMENT:   CLINICAL IMPRESSION: Patient presents to PT with no current pain and reports only stiffness in the Rt knee. Session today continued to focus on Rt knee ROM and strengthening, particularly quad control. Patient was able to tolerate all prescribed exercises with no adverse effects. He did need an occasional rest break during session due to fatigue, particularly with standing exercises. Patient continues to benefit from skilled PT services and should be progressed as able to improve functional independence.  OBJECTIVE IMPAIRMENTS Abnormal gait, decreased balance, decreased endurance, decreased mobility, difficulty walking, decreased ROM, decreased strength, and pain.    ACTIVITY LIMITATIONS carrying, lifting, squatting, stairs, and transfers   PARTICIPATION LIMITATIONS: meal prep, cleaning, and community activity   PERSONAL FACTORS 1-2 comorbidities: Osteoporosis; bipolar disorder   are also affecting patient's functional outcome.    REHAB POTENTIAL: Excellent   CLINICAL DECISION MAKING: Stable/uncomplicated   EVALUATION COMPLEXITY: Low      GOALS: Goals reviewed with patient? No   SHORT TERM GOALS: Target date: 12/23/2021  Pt will be compliant and knowledgeable with initial HEP for improved comfort and carryover Baseline: initial HEP given Goal status: MET 12/27/2021   2.  Pt will self report right knee pain no greater than 6/10 for improved comfort and functional ability Baseline: 8/10 at worst Goal status: MET 12/27/2021   LONG TERM GOALS: Target date: 02/24/2022    Pt will self report right knee pain no greater than 1-2/10 for improved comfort and functional ability Baseline: 8/10 at worst Goal status: INITIAL   2.  Pt will improve FOTO function score to no less than 64% as proxy for functional improvement Baseline: 47% function Goal status: INITIAL   3.  Pt will improve R knee AROM to range on 0-12 degrees for improved functional mobility and community navigation Baseline: see chart Goal status: INITIAL   4.  Pt will increase 30 Second Sit to Stand rep count to no less than 15 reps with no UE supportfor improved balance, strength, and functional mobility Baseline: 14 reps with bilateral UE support  Goal status: INITIAL    5.  Pt will be able to ambulate 1022f with LRAD for improved functional mobility and safety with community activites Baseline: unable Goal status: INITIAL   PLAN: PT FREQUENCY: 2x/week   PT DURATION: 12 weeks   PLANNED INTERVENTIONS: Therapeutic exercises, Therapeutic activity, Neuromuscular re-education, Balance training, Gait training, Patient/Family education, Joint mobilization, Aquatic Therapy, Dry Needling, Electrical stimulation, Cryotherapy, Moist heat, Taping, Vasopneumatic device, Manual therapy, and Re-evaluation   PLAN FOR NEXT SESSION: assess HEP response, progress LE strength and R knee ROM, Step up's.     SEvelene Croon PTA 12/29/2021, 12:55 PM

## 2021-12-30 NOTE — Therapy (Signed)
OUTPATIENT PHYSICAL THERAPY TREATMENT NOTE   Patient Name: Robert Rhodes MRN: [id number] DOB:[birth year], 76 y.o., male Today's Date: 12/30/2021  PCP: [name], MD  REFERRING PROVIDER: Leandrew Koyanagi, MD  END OF SESSION:         Past Medical History:  Diagnosis Date   Adenomatous colon polyp    Arthritis    bilateral knees-needs replacements   Claustrophobia    Colon cancer ([place]) 2015   COVID-19    Eye abnormalities    right eye seen in ED 02/17/2014 wearing eye patch using eye drops   GERD (gastroesophageal reflux disease)    with certain foods   Glaucoma    not on eye drops at this time (10/21/2020)   Hypertension    on meds   Irregular heartbeat    per pt/doesn't know what it does   Osteoarthritis    bilat knees  needs replacement   Osteoporosis    pt denies   Substance abuse ([place])    stopped over 3 years ago   UC (ulcerative colitis) ([place])    Vitamin D deficiency    Past Surgical History:  Procedure Laterality Date   COLON RESECTION N/A 08/23/2013   Procedure: Laparoscopic total abdominal colectomy and hernia repair;  Surgeon: Leighton Ruff, MD;  Location: [facility];  Service: General;  Laterality: N/A;   COLON SURGERY  08/2013   COLONOSCOPY  11/02/2020   EUS N/A 07/18/2013   Procedure: UPPER ENDOSCOPIC ULTRASOUND (EUS) RADIAL;  Surgeon: Milus Banister, MD;  [facility];  Service: Endoscopy;  Laterality: N/A;   EYE SURGERY     FLEXIBLE SIGMOIDOSCOPY N/A 2020   prep good   HERNIA REPAIR  2005   [place]   INCISIONAL HERNIA REPAIR N/A 02/20/2014   Procedure: LAP ASSISTED INCISIONAL HERNIA REPAIR LYSIS OF ADHESIONS;  Surgeon: Leighton Ruff, MD;  Location: [facility];  Service: General;  Laterality: N/A;  converted to open @ [place] N/A 02/20/2014   Procedure: HERNIA REPAIR INCISIONAL;  Surgeon: Leighton Ruff, MD;  Location[facility];  Service: General;  Laterality: N/A;  With MESH   INTRAOCULAR LENS INSERTION  Bilateral    6 yrs ago   TOTAL KNEE ARTHROPLASTY Right 11/11/2021   Procedure: RIGHT TOTAL KNEE ARTHROPLASTY;  Surgeon: Leandrew Koyanagi, MD;  Location: [place];  Service: Orthopedics;  Laterality: Right;   VENTRAL HERNIA REPAIR     Patient Active Problem List   Diagnosis Date Noted   Primary osteoarthritis of right knee 11/11/2021   Status post total right knee replacement 11/11/2021   AKI (acute kidney injury) ([place]) 04/21/2019   Pneumonia due to COVID-19 virus 04/21/2019   Osteoporosis    Ulcerative colitis, with rectal bleeding 08/06/2015   Gastroesophageal reflux disease without esophagitis 08/06/2015   Visual impairment of right eye 06/18/2015   Essential hypertension, benign 06/18/2015   Hernia of abdominal cavity 11/27/2014   Bipolar 1 disorder, mixed, moderate ([place]) 11/27/2014   Cellulitis 08/23/2014   Medically noncompliant 08/23/2014   Left leg cellulitis 08/23/2014   Cellulitis of left leg    Hypokalemia    Diarrhea 05/06/2014   Ulcerative colitis ([place]) 04/17/2014   Verrucous keratosis 04/17/2014   Nonspecific (abnormal) findings on radiological and other examination of gastrointestinal tract 07/18/2013   Acute esophagitis 07/18/2013   Hypovitaminosis D 05/06/2013   Ulcerative colitis, unspecified 04/04/2013   Knee pain, bilateral 03/05/2013    REFERRING DIAG: Z96.651 (ICD-10-CM) - Status  post total right knee replacement M17.11 (ICD-10-CM) - Primary osteoarthritis of right knee  THERAPY DIAG:  No diagnosis found.  Rationale for Evaluation and Treatment Rehabilitation  PERTINENT HISTORY: Osteoporosis; bipolar disorder   PRECAUTIONS: None  ONSET DATE: 11/11/2021  SUBJECTIVE: Patient reports increased stiffness due to being a bit more sedentary the last two days.    PAIN:  Are you having pain?  Yes: NPRS scale: 0/10 (5/10 at worst) Pain location: R knee Pain description: stiff, sharp Aggravating factors: prolonged position Relieving factors: movement,  ice  OBJECTIVE: (objective measures completed at initial evaluation unless otherwise dated)  DIAGNOSTIC FINDINGS:            See imaging   PATIENT SURVEYS:  FOTO: 47% function; 64% predicted    COGNITION:           Overall cognitive status: Within functional limits for tasks assessed                          SENSATION: Light touch: decreased light touch in lateral R tibaia   POSTURE:  rounded shoulders and forward head   PALPATION: Slight TTP to distal R quad   LOWER EXTREMITY ROM:      AROM Right 12/02/2021   Right 12/08/2021  Right  12/16/2021 Right 12/20/2021  Right 12/22/2021  Knee flexion 98  99 110 130 108  Knee extension 7  6 3 3 3                         (Blank rows = not tested)     LOWER EXTREMITY MMT:     MMT Right 12/02/2021  Left 12/02/2021   Hip flexion       Hip extension      Hip abduction      Hip adduction      Hip external rotation      Hip internal rotation      Knee extension DNT    Knee flexion DNT    Ankle dorsiflexion       Ankle plantarflexion      Ankle inversion      Ankle eversion      Grossly      (Blank rows = not tested)   FUNCTIONAL TESTS:  30 Second Sit to Stand: 14 reps with bilateral UE support   GAIT: Distance walked: 37f Assistive device utilized: WEnvironmental consultant- 2 wheeled Level of assistance: Complete Independence Comments: decreased gait speed, antalgic gait, decrease R knee ext   TODAY'S TREATMENT: [facility] Adult PT Treatment:                                                DATE: 01/03/2022 Therapeutic Exercise: NuStep level 5 x 5 mins Slant board gastroc stretch 1' Sustained hamstring stretch with 7.5b weight for OP 1 min x2 Sit to stand 2x10 on airex LAQ 4# 2x10 with 3" hold at top Step up 6" step R LE lead  2 x 10  Heel raises 2x10 increased difficulty without UE assist.  Heel taps 4" step 2 x 10  TKE against BTB 2x10 Manual Therapy: AP mobs in supine Short sitting with OP into flexion using L LE 2 x 10 - 5" hold   [facility]  [facility] Therapeutic Exercise: NuStep level 5 x 5 mins Slant board gastroc stretch 1' Sit to stand 10 x on airex LAQ 4# 2x10 with 3" hold at top Step up 6" step R LE lead  2 x 10  Lateral step up 6" step R LE lead  2 x 10  Heel raises 2x10 Heel taps 2" step 2 x 10  TKE against BTB 2x10 Seated hamstring stretch 2x30" Rt Manual Therapy: AP mobs in supine Short sitting with OP into flexion using L LE 2 x 10 - 5" hold   [facility] Adult [facility] Treatment:                                                DATE: 12/27/2021 Therapeutic Exercise: NuStep level 5 x 5 mins Slant board gastroc stretch 1' Sit to stand 10 x on airex.  Step up 6" step R LE lead  2 x 10  Lateral step up 6" step R LE lead  2 x 10  Manual Therapy: STM to calf with increased tension noted in lateral gastoc head.  Short sitting with OP into flexion using L LE 2 x 10 - 5" hold  Neuro:  Heel taps 2" step 2 x 10  TKE against BTB 2 x 20     PATIENT EDUCATION:  Education details: eval findings, FOTO, HEP, POC Person educated: Patient Education method: Explanation, Demonstration, and Handouts Education comprehension: verbalized understanding and returned demonstration     HOME EXERCISE PROGRAM: Access Code: [email or web address] URL: [email or web address] Date: 12/02/2021 Prepared by: Octavio Manns   Exercises - Seated Hamstring Stretch  - 2-3 x daily - 7 x weekly - 2-3 reps - 30 sec hold - Seated Heel Slide  - 2-3 x daily - 7 x weekly - 2-3 sets - 10 reps - 5 hold - Supine Quad Set  - 2-3 x daily - 7 x weekly - 2-3 sets - 10 reps - Active Straight Leg Raise with Quad Set  - 2-3 x daily - 7 x weekly - 2-3 sets - 10 reps - Supine Heel Slide  - 2-3 x daily - 7 x weekly - 2-3 sets - 10 reps - 5 sec hold   ASSESSMENT:   CLINICAL IMPRESSION: Patient presents to PT with no current pain and reports of increased stiffness in the Rt knee. Session  today continued to focus on Rt knee ROM and strengthening, particularly quad control and terminal knee extension. Pt repots improved symptoms throughout session. Patient was able to tolerate all prescribed exercises with no adverse effects. He did need an occasional rest break during session due to fatigue, particularly with standing exercises. Patient continues to benefit from skilled PT services and should be progressed as able to improve functional independence.  OBJECTIVE IMPAIRMENTS Abnormal gait, decreased balance, decreased endurance, decreased mobility, difficulty walking, decreased ROM, decreased strength, and pain.    ACTIVITY LIMITATIONS carrying, lifting, squatting, stairs, and transfers   PARTICIPATION LIMITATIONS: meal prep, cleaning, and community activity   PERSONAL FACTORS 1-2 comorbidities: Osteoporosis; bipolar disorder   are also affecting patient's functional outcome.    REHAB POTENTIAL: Excellent   CLINICAL DECISION MAKING: Stable/uncomplicated   EVALUATION COMPLEXITY: Low     GOALS: Goals reviewed with patient? No   SHORT TERM GOALS: Target date: 12/23/2021  Pt will be compliant and knowledgeable with initial HEP for improved  comfort and carryover Baseline: initial HEP given Goal status: MET 12/27/2021   2.  Pt will self report right knee pain no greater than 6/10 for improved comfort and functional ability Baseline: 8/10 at worst Goal status: MET 12/27/2021   LONG TERM GOALS: Target date: 02/24/2022    Pt will self report right knee pain no greater than 1-2/10 for improved comfort and functional ability Baseline: 8/10 at worst Goal status: INITIAL   2.  Pt will improve FOTO function score to no less than 64% as proxy for functional improvement Baseline: 47% function Goal status: INITIAL   3.  Pt will improve R knee AROM to range on 0-12 degrees for improved functional mobility and community navigation Baseline: see chart Goal status: INITIAL   4.  Pt  will increase 30 Second Sit to Stand rep count to no less than 15 reps with no UE supportfor improved balance, strength, and functional mobility Baseline: 14 reps with bilateral UE support  Goal status: INITIAL    5.  Pt will be able to ambulate 1034f with LRAD for improved functional mobility and safety with community activites Baseline: unable Goal status: INITIAL   PLAN: PT FREQUENCY: 2x/week   PT DURATION: 12 weeks   PLANNED INTERVENTIONS: Therapeutic exercises, Therapeutic activity, Neuromuscular re-education, Balance training, Gait training, Patient/Family education, Joint mobilization, Aquatic Therapy, Dry Needling, Electrical stimulation, Cryotherapy, Moist heat, Taping, Vasopneumatic device, Manual therapy, and Re-evaluation   PLAN FOR NEXT SESSION: assess HEP response, progress LE strength and R knee ROM, Step up's.     SLynden Ang PT 12/30/2021, 9:03 AM

## 2021-12-31 ENCOUNTER — Telehealth: Payer: Self-pay

## 2021-12-31 NOTE — Telephone Encounter (Signed)
Pt called and states that he had knee surgery and wants to know when he is allowed to wash the area. Cb [phone]

## 2021-12-31 NOTE — Telephone Encounter (Signed)
Called and informed patient. 

## 2021-12-31 NOTE — Telephone Encounter (Signed)
He can shower and wash now. No scrubbing. He can get into his personal bathtub if he wants. No pools or public swimming yet.

## 2022-01-03 ENCOUNTER — Ambulatory Visit: Payer: Medicare HMO | Admitting: Physical Therapy

## 2022-01-03 DIAGNOSIS — M6281 Muscle weakness (generalized): Secondary | ICD-10-CM

## 2022-01-03 DIAGNOSIS — R2689 Other abnormalities of gait and mobility: Secondary | ICD-10-CM

## 2022-01-03 DIAGNOSIS — M25561 Pain in right knee: Secondary | ICD-10-CM

## 2022-01-05 ENCOUNTER — Ambulatory Visit: Payer: Medicare HMO

## 2022-01-05 DIAGNOSIS — M25561 Pain in right knee: Secondary | ICD-10-CM | POA: Diagnosis not present

## 2022-01-05 DIAGNOSIS — R2689 Other abnormalities of gait and mobility: Secondary | ICD-10-CM | POA: Diagnosis not present

## 2022-01-05 DIAGNOSIS — M6281 Muscle weakness (generalized): Secondary | ICD-10-CM | POA: Diagnosis not present

## 2022-01-05 NOTE — Therapy (Signed)
OUTPATIENT PHYSICAL THERAPY TREATMENT NOTE   Patient Name: Robert Rhodes MRN: [id number] DOB:March 31, 1946, 76 y.o., male Today's Date: 01/05/2022  PCP: [name], MD  REFERRING PROVIDER: Leandrew Koyanagi, MD  END OF SESSION:     PT End of Session - 01/05/22 1206     Visit Number 9    Number of Visits 24    Date for PT Re-Evaluation 02/24/22    Authorization Type [employer] MCR    Authorization - Visit Number 9    Authorization - Number of Visits 12    PT Start Time 1207    PT Stop Time 5465    PT Time Calculation (min) 40 min    Activity Tolerance Patient tolerated treatment well    Behavior During Therapy WFL for tasks assessed/performed              Past Medical History:  Diagnosis Date   Adenomatous colon polyp    Arthritis    bilateral knees-needs replacements   Claustrophobia    Colon cancer ([place]) 2015   COVID-19    Eye abnormalities    right eye seen in ED 02/17/2014 wearing eye patch using eye drops   GERD (gastroesophageal reflux disease)    with certain foods   Glaucoma    not on eye drops at this time (10/21/2020)   Hypertension    on meds   Irregular heartbeat    per pt/doesn't know what it does   Osteoarthritis    bilat knees  needs replacement   Osteoporosis    pt denies   Substance abuse ([place])    stopped over 3 years ago   UC (ulcerative colitis) ([place])    Vitamin D deficiency    Past Surgical History:  Procedure Laterality Date   COLON RESECTION N/A 08/23/2013   Procedure: Laparoscopic total abdominal colectomy and hernia repair;  Surgeon: Leighton Ruff, MD;  [facility];  Service: General;  Laterality: N/A;   COLON SURGERY  08/2013   COLONOSCOPY  11/02/2020   EUS N/A 07/18/2013   Procedure: UPPER ENDOSCOPIC ULTRASOUND (EUS) RADIAL;  Surgeon: Milus Banister, MD;  [facility];  Service: Endoscopy;  Laterality: N/A;   EYE SURGERY     FLEXIBLE SIGMOIDOSCOPY N/A 2020   prep good   HERNIA REPA[place]  2005   [place]    INCISIONAL HERNIA REPAIR N/A 02/20/2014   Procedure: LAP ASSISTED INCISIONAL HERNIA REPAIR LYSIS OF ADHESIONS;  Surgeon: Leighton Ruff, MD;  [facility];  Service: General;  Laterality: N/A;  converted to open @ [place] N/A 02/20/2014   Procedure: HERNIA REPAIR INCISIONAL;  Surgeon: Leighton Ruff, MD;  [facility];  Service: General;  Laterality: N/A;  With MESH   INTRAOCULAR LENS INSERTION Bilateral    6 yrs ago   TOTAL KNEE ARTHROPLASTY Right 11/11/2021   Procedure: RIGHT TOTAL KNEE ARTHROPLASTY;  Surgeon: Leandrew Koyanagi, MD;  Location: [place];  Service: Orthopedics;  Laterality: Right;   VENTRAL HERNIA REPAIR     Patient Active Problem List   Diagnosis Date Noted   Primary osteoarthritis of right knee 11/11/2021   Status post total right knee replacement 11/11/2021   AKI (acute kidney injury) ([place]) 04/21/2019   Pneumonia due to COVID-19 virus 04/21/2019   Osteoporosis    Ulcerative colitis, with rectal bleeding 08/06/2015   Gastroesophageal reflux disease without esophagitis 08/06/2015   Visual impairment of right eye 06/18/2015   Essential hypertension,  benign 06/18/2015   Hernia of abdominal cavity 11/27/2014   Bipolar 1 disorder, mixed, moderate ([facility]) 11/27/2014   Cellulitis 08/23/2014   Medically noncompliant 08/23/2014   Left leg cellulitis 08/23/2014   Cellulitis of left leg    Hypokalemia    Diarrhea 05/06/2014   Ulcerative colitis ([place]) 04/17/2014   Verrucous keratosis 04/17/2014   Nonspecific (abnormal) findings on radiological and other examination of gastrointestinal tract 07/18/2013   Acute esophagitis 07/18/2013   Hypovitaminosis D 05/06/2013   Ulcerative colitis, unspecified 04/04/2013   Knee pain, bilateral 03/05/2013    REFERRING DIAG: Z96.651 (ICD-10-CM) - Status post total right knee replacement M17.11 (ICD-10-CM) - Primary osteoarthritis of right knee  THERAPY DIAG:  Acute pain of right knee  Muscle weakness  (generalized)  Other abnormalities of gait and mobility  Rationale for Evaluation and Treatment Rehabilitation  PERTINENT HISTORY: Osteoporosis; bipolar disorder   PRECAUTIONS: None  ONSET DATE: 11/11/2021  SUBJECTIVE: Patient reports no current pain, just stiffness.  PAIN:  Are you having pain?  Yes: NPRS scale: 0/10 (5/10 at worst) Pain location: R knee Pain description: stiff, sharp Aggravating factors: prolonged position Relieving factors: movement, ice  OBJECTIVE: (objective measures completed at initial evaluation unless otherwise dated)  DIAGNOSTIC FINDINGS:            See imaging   PATIENT SURVEYS:  FOTO: 47% function; 64% predicted    COGNITION:           Overall cognitive status: Within functional limits for tasks assessed                          SENSATION: Light touch: decreased light touch in lateral R tibaia   POSTURE:  rounded shoulders and forward head   PALPATION: Slight TTP to distal R quad   LOWER EXTREMITY ROM:      AROM Right 12/02/2021   Right 12/08/2021  Right  12/16/2021 Right 12/20/2021  Right 12/22/2021  Knee flexion 98  99 110 130 AROM 108  Knee extension 7  6 3 3 3                         (Blank rows = not tested)     LOWER EXTREMITY MMT:     MMT Right 12/02/2021  Left 12/02/2021   Hip flexion       Hip extension      Hip abduction      Hip adduction      Hip external rotation      Hip internal rotation      Knee extension DNT    Knee flexion DNT    Ankle dorsiflexion       Ankle plantarflexion      Ankle inversion      Ankle eversion      Grossly      (Blank rows = not tested)   FUNCTIONAL TESTS:  30 Second Sit to Stand: 14 reps with bilateral UE support   GAIT: Distance walked: 11f Assistive device utilized: WEnvironmental consultant- 2 wheeled Level of assistance: Complete Independence Comments: decreased gait speed, antalgic gait, decrease R knee ext   TODAY'S TREATMENT: [facility] Adult PT Treatment:  DATE: 01/05/2022 Therapeutic Exercise: Bike x5 mins full revolutions Slant board gastroc stretch 2' Sit to stand 10 x on airex Seated hamstring curl BlueTB 2x10 LAQ 4# 2x10 with 3" hold at top Step up 6" step R LE lead  2 x 10  Lateral step up 6" step R LE lead  2 x 10  Heel/toe raises 3x10 TKE against BTB 2x10 Seated hamstring stretch 3x30" Rt   [facility] Adult PT Treatment:                                                DATE: 12/29/2021 Therapeutic Exercise: NuStep level 5 x 5 mins Slant board gastroc stretch 1' Sit to stand 10 x on airex LAQ 4# 2x10 with 3" hold at top Step up 6" step R LE lead  2 x 10  Lateral step up 6" step R LE lead  2 x 10  Heel raises 2x10 Heel taps 2" step 2 x 10  TKE against BTB 2x10 Seated hamstring stretch 2x30" Rt Manual Therapy: AP mobs in supine Short sitting with OP into flexion using L LE 2 x 10 - 5" hold   [facility] Adult PT Treatment:                                                DATE: 12/27/2021 Therapeutic Exercise: NuStep level 5 x 5 mins Slant board gastroc stretch 1' Sit to stand 10 x on airex.  Step up 6" step R LE lead  2 x 10  Lateral step up 6" step R LE lead  2 x 10  Manual Therapy: STM to calf with increased tension noted in lateral gastoc head.  Short sitting with OP into flexion using L LE 2 x 10 - 5" hold  Neuro:  Heel taps 2" step 2 x 10  TKE against BTB 2 x 20   PATIENT EDUCATION:  Education details: eval findings, FOTO, HEP, POC Person educated: Patient Education method: Explanation, Demonstration, and Handouts Education comprehension: verbalized understanding and returned demonstration     HOME EXERCISE PROGRAM: Access Code: [email or web address] URL: [email or web address] Date: 12/02/2021 Prepared by: Octavio Manns   Exercises - Seated Hamstring Stretch  - 2-3 x daily - 7 x weekly - 2-3 reps - 30 sec hold - Seated Heel Slide  - 2-3 x daily - 7 x weekly - 2-3 sets - 10 reps - 5 hold - Supine Quad  Set  - 2-3 x daily - 7 x weekly - 2-3 sets - 10 reps - Active Straight Leg Raise with Quad Set  - 2-3 x daily - 7 x weekly - 2-3 sets - 10 reps - Supine Heel Slide  - 2-3 x daily - 7 x weekly - 2-3 sets - 10 reps - 5 sec hold   ASSESSMENT:   CLINICAL IMPRESSION: Patient presents to PT with no current pain and reports mostly stiffness in the Rt knee. Introduced the bike this session with patient able to make full revolutions, though he did report a slight increase in pain in the Rt knee at the beginning that became less by the end. Session today continued to focus on Rt knee ROM and strengthening. He remains limited by fatigue occasionally throughout session,  with standing exercises. He is doing excellent with therapy as demonstrated by his improved ROM, decreased strength, and improved functional mobility. Patient continues to benefit from skilled PT services and should be progressed as able to improve functional independence.    OBJECTIVE IMPAIRMENTS Abnormal gait, decreased balance, decreased endurance, decreased mobility, difficulty walking, decreased ROM, decreased strength, and pain.    ACTIVITY LIMITATIONS carrying, lifting, squatting, stairs, and transfers   PARTICIPATION LIMITATIONS: meal prep, cleaning, and community activity   PERSONAL FACTORS 1-2 comorbidities: Osteoporosis; bipolar disorder   are also affecting patient's functional outcome.    REHAB POTENTIAL: Excellent   CLINICAL DECISION MAKING: Stable/uncomplicated   EVALUATION COMPLEXITY: Low     GOALS: Goals reviewed with patient? No   SHORT TERM GOALS: Target date: 12/23/2021  Pt will be compliant and knowledgeable with initial HEP for improved comfort and carryover Baseline: initial HEP given Goal status: MET 12/27/2021   2.  Pt will self report right knee pain no greater than 6/10 for improved comfort and functional ability Baseline: 8/10 at worst Goal status: MET 12/27/2021   LONG TERM GOALS: Target date:  02/24/2022    Pt will self report right knee pain no greater than 1-2/10 for improved comfort and functional ability Baseline: 8/10 at worst Goal status: INITIAL   2.  Pt will improve FOTO function score to no less than 64% as proxy for functional improvement Baseline: 47% function Goal status: INITIAL   3.  Pt will improve R knee AROM to range on 0-12 degrees for improved functional mobility and community navigation Baseline: see chart Goal status: In progress 3-108   4.  Pt will increase 30 Second Sit to Stand rep count to no less than 15 reps with no UE supportfor improved balance, strength, and functional mobility Baseline: 14 reps with bilateral UE support  Goal status: INITIAL    5.  Pt will be able to ambulate 1023f with LRAD for improved functional mobility and safety with community activites Baseline: unable Goal status: INITIAL   PLAN: PT FREQUENCY: 2x/week   PT DURATION: 12 weeks   PLANNED INTERVENTIONS: Therapeutic exercises, Therapeutic activity, Neuromuscular re-education, Balance training, Gait training, Patient/Family education, Joint mobilization, Aquatic Therapy, Dry Needling, Electrical stimulation, Cryotherapy, Moist heat, Taping, Vasopneumatic device, Manual therapy, and Re-evaluation   PLAN FOR NEXT SESSION: assess HEP response, progress LE strength and R knee ROM, Step up's.     SEvelene Croon PTA 01/05/2022, 12:50 PM

## 2022-01-07 ENCOUNTER — Encounter: Payer: Self-pay | Admitting: Family Medicine

## 2022-01-07 ENCOUNTER — Ambulatory Visit: Payer: Medicare HMO | Attending: Family Medicine | Admitting: Pharmacist

## 2022-01-07 VITALS — BP 132/72

## 2022-01-07 DIAGNOSIS — I10 Essential (primary) hypertension: Secondary | ICD-10-CM | POA: Diagnosis not present

## 2022-01-07 NOTE — Progress Notes (Signed)
   S:     No chief complaint on file.  Robert Rhodes is a 76 y.o. male who presents for hypertension evaluation, education, and management. 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 Patient was referred and last seen by Primary Care Provider, Dr. Margarita Rana, on 11/30/21. At last visit, BP was elevated at 153/74 mmHg.   Today, patient arrives in spirits and presents with a cane. Denies dizziness, headache, blurred vision, swelling.   Medication adherence reported, takes amlodipine around 12PM every day . Patient has NOT taken BP medications today.   Current antihypertensives include: amlodipine 5 mg  Reported home BP readings: 130/78 2 days. 135/84 4 days ago.   Patient-reported exercise habits: limited due to age and R TKA  O:  Vitals:   01/07/22 1114  BP: 132/72   Last 3 Office BP readings: BP Readings from Last 3 Encounters:  01/07/22 132/72  11/30/21 (!) 153/74  11/23/21 134/76    BMET    Component Value Date/Time   NA 132 (L) 11/30/2021 1112   K 3.8 11/30/2021 1112   CL 94 (L) 11/30/2021 1112   CO2 23 11/30/2021 1112   GLUCOSE 81 11/30/2021 1112   GLUCOSE 92 11/23/2021 1438   BUN 11 11/30/2021 1112   CREATININE 0.96 11/30/2021 1112   CREATININE 0.82 11/27/2014 1558   CALCIUM 9.7 11/30/2021 1112   GFRNONAA >60 11/23/2021 1438   GFRAA 79 10/22/2019 1043    Renal function: CrCl cannot be calculated (Patient's most recent lab result is older than the maximum 21 days allowed.).  Clinical ASCVD: No  The ASCVD Risk score (Arnett DK, et al., 2019) failed to calculate for the following reasons:   The valid total cholesterol range is 130 to 320 mg/dL  A/P: Hypertension longstanding, currently close to goal on current medications. BP goal < 130/80 mmHg. Medication adherence appears optimal. -Continued amlodipine 5 mg daily as patient's BP close to goal without taking BP medications today.   -Educated patient to take BP medications prior to visits. -Encouraged patient to check BP at home and bring log of readings to next visit. Counseled on proper use of home BP cuff.    Results reviewed and written information provided.    Written patient instructions provided. Patient verbalized understanding of treatment plan.  Total time in face to face counseling 25 minutes.    Follow-up:  Pharmacist 1 month. PCP clinic visit in 03/21/22.   Joseph Art, Pharm.D. PGY-2 Ambulatory Care Pharmacy Resident 01/07/2022 11:25 AM

## 2022-01-12 ENCOUNTER — Ambulatory Visit: Payer: Medicare HMO | Attending: Orthopaedic Surgery

## 2022-01-12 DIAGNOSIS — R2689 Other abnormalities of gait and mobility: Secondary | ICD-10-CM | POA: Diagnosis not present

## 2022-01-12 DIAGNOSIS — M25561 Pain in right knee: Secondary | ICD-10-CM | POA: Diagnosis not present

## 2022-01-12 DIAGNOSIS — M6281 Muscle weakness (generalized): Secondary | ICD-10-CM | POA: Diagnosis not present

## 2022-01-12 NOTE — Therapy (Signed)
OUTPATIENT PHYSICAL THERAPY TREATMENT NOTE   Patient Name: Robert Rhodes MRN: [id number] DOB:02-Dec-1945, 76 y.o., male Today's Date: 01/12/2022  PCP: [name], MD  REFERRING PROVIDER: Leandrew Koyanagi, MD  END OF SESSION:     PT End of Session - 01/12/22 1043     Visit Number 10    Number of Visits 24    Date for PT Re-Evaluation 02/24/22    Authorization Type [employer] MCR    Authorization - Visit Number 10    Authorization - Number of Visits 12    PT Start Time 1779    PT Stop Time 1121    PT Time Calculation (min) 38 min    Activity Tolerance Patient tolerated treatment well;Patient limited by fatigue    Behavior During Therapy WFL for tasks assessed/performed             Past Medical History:  Diagnosis Date   Adenomatous colon polyp    Arthritis    bilateral knees-needs replacements   Claustrophobia    Colon cancer ([place]) 2015   COVID-19    Eye abnormalities    right eye seen in ED 02/17/2014 wearing eye patch using eye drops   GERD (gastroesophageal reflux disease)    with certain foods   Glaucoma    not on eye drops at this time (10/21/2020)   Hypertension    on meds   Irregular heartbeat    per pt/doesn't know what it does   Osteoarthritis    bilat knees  needs replacement   Osteoporosis    pt denies   Substance abuse ([place])    stopped over 3 years ago   UC (ulcerative colitis) ([place])    Vitamin D deficiency    Past Surgical History:  Procedure Laterality Date   COLON RESECTION N/A 08/23/2013   Procedure: Laparoscopic total abdominal colectomy and hernia repair;  Surgeon: Leighton Ruff, MD;  Location: [facility];  Service: General;  Laterality: N/A;   COLON SURGERY  08/2013   COLONOSCOPY  11/02/2020   EUS N/A 07/18/2013   Procedure: UPPER ENDOSCOPIC ULTRASOUND (EUS) RADIAL;  Surgeon: Milus Banister, MD;  Location: [facility];  Service: Endoscopy;  Laterality: N/A;   EYE SURGERY     FLEXIBLE SIGMOIDOSCOPY N/A 2020   prep good   HERNIA REPAIR   2005   In [place]/put in a mesh   INCISIONAL HERNIA REPAIR N/A 02/20/2014   Procedure: LAP ASSISTED INCISIONAL HERNIA REPAIR LYSIS OF ADHESIONS;  Surgeon: Leighton Ruff, MD;  Location: [facility];  Service: General;  Laterality: N/A;  converted to open @ [place] N/A 02/20/2014   Procedure: HERNIA REPAIR INCISIONAL;  Surgeon: Leighton Ruff, MD;  Location: [facility];  Service: General;  Laterality: N/A;  With MESH   INTRAOCULAR LENS INSERTION Bilateral    6 yrs ago   TOTAL KNEE ARTHROPLASTY Right 11/11/2021   Procedure: RIGHT TOTAL KNEE ARTHROPLASTY;  Surgeon: Leandrew Koyanagi, MD;  Location: [facility];  Service: Orthopedics;  Laterality: Right;   VENTRAL HERNIA REPAIR     Patient Active Problem List   Diagnosis Date Noted   Primary osteoarthritis of right knee 11/11/2021   Status post total right knee replacement 11/11/2021   AKI (acute kidney injury) ([place]) 04/21/2019   Pneumonia due to COVID-19 virus 04/21/2019   Osteoporosis    Ulcerative colitis, with rectal bleeding 08/06/2015   Gastroesophageal reflux disease without esophagitis 08/06/2015   Visual impairment of right eye 06/18/2015  Essential hypertension, benign 06/18/2015   Hernia of abdominal cavity 11/27/2014   Bipolar 1 disorder, mixed, moderate (HCC) 11/27/2014   Cellulitis 08/23/2014   Medically noncompliant 08/23/2014   Left leg cellulitis 08/23/2014   Cellulitis of left leg    Hypokalemia    Diarrhea 05/06/2014   Ulcerative colitis ([place]) 04/17/2014   Verrucous keratosis 04/17/2014   Nonspecific (abnormal) findings on radiological and other examination of gastrointestinal tract 07/18/2013   Acute esophagitis 07/18/2013   Hypovitaminosis D 05/06/2013   Ulcerative colitis, unspecified 04/04/2013   Knee pain, bilateral 03/05/2013    REFERRING DIAG: Z96.651 (ICD-10-CM) - Status post total right knee replacement M17.11 (ICD-10-CM) - Primary osteoarthritis of right knee  THERAPY DIAG:  Acute pain of right  knee  Muscle weakness (generalized)  Other abnormalities of gait and mobility  Rationale for Evaluation and Treatment Rehabilitation  PERTINENT HISTORY: Osteoporosis; bipolar disorder   PRECAUTIONS: None  ONSET DATE: 11/11/2021  SUBJECTIVE: Patient reports no current pain, just stiffness.  PAIN:  Are you having pain?  Yes: NPRS scale: 0/10 (5/10 at worst, describes more stiffness than pain) Pain location: R knee Pain description: stiff, sharp Aggravating factors: prolonged position Relieving factors: movement, ice  OBJECTIVE: (objective measures completed at initial evaluation unless otherwise dated)  DIAGNOSTIC FINDINGS:            See imaging   PATIENT SURVEYS:  FOTO: 47% function; 64% predicted    COGNITION:           Overall cognitive status: Within functional limits for tasks assessed                          SENSATION: Light touch: decreased light touch in lateral R tibaia   POSTURE:  rounded shoulders and forward head   PALPATION: Slight TTP to distal R quad   LOWER EXTREMITY ROM:      AROM Right 12/02/2021   Right 12/08/2021  Right  12/16/2021 Right 12/20/2021  Right 12/22/2021 Right 01/12/2022  Knee flexion 98  99 110 130 AROM 108 105 AROM  Knee extension 7  6 3 3 3 3                         (Blank rows = not tested)     LOWER EXTREMITY MMT:     MMT Right 12/02/2021  Left 12/02/2021   Hip flexion       Hip extension      Hip abduction      Hip adduction      Hip external rotation      Hip internal rotation      Knee extension DNT    Knee flexion DNT    Ankle dorsiflexion       Ankle plantarflexion      Ankle inversion      Ankle eversion      Grossly      (Blank rows = not tested)   FUNCTIONAL TESTS:  30 Second Sit to Stand: 14 reps with bilateral UE support   GAIT: Distance walked: 107f Assistive device utilized: WEnvironmental consultant- 2 wheeled Level of assistance: Complete Independence Comments: decreased gait speed, antalgic gait, decrease R  knee ext   TODAY'S TREATMENT: [facility] Adult PT Treatment:  DATE: 01/12/2022 Therapeutic Exercise: Bike x5 mins full revolutions Slant board gastroc stretch 2' Knee flexion stretch on 12" stacked boxes x20 Sit to stand 10 x on airex LAQ 4# 2x10 with 3" hold at top Step up 6" step R LE lead  2 x 10  Lateral step up 6" step R LE lead  2 x 10  Heel/toe raises 3x10 Seated hamstring stretch 2x30" Rt Manual Therapy: AP mobs in supine Short sitting with OP into flexion x 10 - 5" hold    [facility] Adult PT Treatment:                                                DATE: 01/05/2022 Therapeutic Exercise: Bike x5 mins full revolutions Slant board gastroc stretch 2' Sit to stand 10 x on airex Seated hamstring curl BlueTB 2x10 LAQ 4# 2x10 with 3" hold at top Step up 6" step R LE lead  2 x 10  Lateral step up 6" step R LE lead  2 x 10  Heel/toe raises 3x10 TKE against BTB 2x10 Seated hamstring stretch 3x30" Rt   [facility] Adult PT Treatment:                                                DATE: 12/29/2021 Therapeutic Exercise: NuStep level 5 x 5 mins Slant board gastroc stretch 1' Sit to stand 10 x on airex LAQ 4# 2x10 with 3" hold at top Step up 6" step R LE lead  2 x 10  Lateral step up 6" step R LE lead  2 x 10  Heel raises 2x10 Heel taps 2" step 2 x 10  TKE against BTB 2x10 Seated hamstring stretch 2x30" Rt Manual Therapy: AP mobs in supine Short sitting with OP into flexion using L LE 2 x 10 - 5" hold   PATIENT EDUCATION:  Education details: eval findings, FOTO, HEP, POC Person educated: Patient Education method: Explanation, Demonstration, and Handouts Education comprehension: verbalized understanding and returned demonstration     HOME EXERCISE PROGRAM: Access Code: [email or web address] URL: [email or web address] Date: 12/02/2021 Prepared by: Octavio Manns   Exercises - Seated Hamstring Stretch  - 2-3 x daily - 7 x weekly - 2-3 reps -  30 sec hold - Seated Heel Slide  - 2-3 x daily - 7 x weekly - 2-3 sets - 10 reps - 5 hold - Supine Quad Set  - 2-3 x daily - 7 x weekly - 2-3 sets - 10 reps - Active Straight Leg Raise with Quad Set  - 2-3 x daily - 7 x weekly - 2-3 sets - 10 reps - Supine Heel Slide  - 2-3 x daily - 7 x weekly - 2-3 sets - 10 reps - 5 sec hold   ASSESSMENT:   CLINICAL IMPRESSION: Patient presents to PT with no current pain and reports only stiffness in the Rt knee. Session today continued to focus on Rt knee strengthening and ROM with use of manual techniques to decrease tension and tissue restriction. ROM measured today at 3-105 AROM. He was able to complete more standing exercises this session before needing a seated rest break. Patient was able to tolerate all prescribed exercises with no adverse effects. Patient continues to  benefit from skilled PT services and should be progressed as able to improve functional independence.  OBJECTIVE IMPAIRMENTS Abnormal gait, decreased balance, decreased endurance, decreased mobility, difficulty walking, decreased ROM, decreased strength, and pain.    ACTIVITY LIMITATIONS carrying, lifting, squatting, stairs, and transfers   PARTICIPATION LIMITATIONS: meal prep, cleaning, and community activity   PERSONAL FACTORS 1-2 comorbidities: Osteoporosis; bipolar disorder   are also affecting patient's functional outcome.    REHAB POTENTIAL: Excellent   CLINICAL DECISION MAKING: Stable/uncomplicated   EVALUATION COMPLEXITY: Low     GOALS: Goals reviewed with patient? No   SHORT TERM GOALS: Target date: 12/23/2021  Pt will be compliant and knowledgeable with initial HEP for improved comfort and carryover Baseline: initial HEP given Goal status: MET 12/27/2021   2.  Pt will self report right knee pain no greater than 6/10 for improved comfort and functional ability Baseline: 8/10 at worst Goal status: MET 12/27/2021   LONG TERM GOALS: Target date: 02/24/2022    Pt  will self report right knee pain no greater than 1-2/10 for improved comfort and functional ability Baseline: 8/10 at worst Goal status: INITIAL   2.  Pt will improve FOTO function score to no less than 64% as proxy for functional improvement Baseline: 47% function Goal status: INITIAL   3.  Pt will improve R knee AROM to range on 0-12 degrees for improved functional mobility and community navigation Baseline: see chart Goal status: In progress 3-108   4.  Pt will increase 30 Second Sit to Stand rep count to no less than 15 reps with no UE supportfor improved balance, strength, and functional mobility Baseline: 14 reps with bilateral UE support  Goal status: INITIAL    5.  Pt will be able to ambulate 1035f with LRAD for improved functional mobility and safety with community activites Baseline: unable Goal status: INITIAL   PLAN: PT FREQUENCY: 2x/week   PT DURATION: 12 weeks   PLANNED INTERVENTIONS: Therapeutic exercises, Therapeutic activity, Neuromuscular re-education, Balance training, Gait training, Patient/Family education, Joint mobilization, Aquatic Therapy, Dry Needling, Electrical stimulation, Cryotherapy, Moist heat, Taping, Vasopneumatic device, Manual therapy, and Re-evaluation   PLAN FOR NEXT SESSION: assess HEP response, progress LE strength and R knee ROM, Step up's.     SMargarette [place] PTA 01/12/22 11:21 AM

## 2022-01-13 DIAGNOSIS — H40013 Open angle with borderline findings, low risk, bilateral: Secondary | ICD-10-CM | POA: Diagnosis not present

## 2022-01-18 NOTE — Therapy (Signed)
OUTPATIENT PHYSICAL THERAPY TREATMENT NOTE   Patient Name: Robert Rhodes MRN: [id number] DOB:08-Oct-1945, 76 y.o., male Today's Date: 01/19/2022  PCP: [name], MD  REFERRING PROVIDER: Leandrew Koyanagi, MD  END OF SESSION:     PT End of Session - 01/19/22 1045     Visit Number 11    Number of Visits 24    Date for PT Re-Evaluation 02/24/22    Authorization Type [employer] MCR    Authorization - Visit Number 11    Authorization - Number of Visits 12    PT Start Time 8101    PT Stop Time 1125    PT Time Calculation (min) 40 min    Activity Tolerance Patient tolerated treatment well;Patient limited by fatigue    Behavior During Therapy [facility] for tasks assessed/performed              Past Medical History:  Diagnosis Date   Adenomatous colon polyp    Arthritis    bilateral knees-needs replacements   Claustrophobia    Colon cancer ([place]) 2015   COVID-19    Eye abnormalities    right eye seen in ED 02/17/2014 wearing eye patch using eye drops   GERD (gastroesophageal reflux disease)    with certain foods   Glaucoma    not on eye drops at this time (10/21/2020)   Hypertension    on meds   Irregular heartbeat    per pt/doesn't know what it does   Osteoarthritis    bilat knees  needs replacement   Osteoporosis    pt denies   Substance abuse ([place])    stopped over 3 years ago   UC (ulcerative colitis) ([place])    Vitamin D deficiency    Past Surgical History:  Procedure Laterality Date   COLON RESECTION N/A 08/23/2013   Procedure: Laparoscopic total abdominal colectomy and hernia repair;  Surgeon: Leighton Ruff, MD;  [facility]: [facility];  Service: General;  Laterality: N/A;   COLON SURGERY  08/2013   COLONOSCOPY  11/02/2020   EUS N/A 07/18/2013   Procedure: UPPER ENDOSCOPIC ULTRASOUND (EUS) RADIAL;  Surgeon: Milus Banister, MD;  [facility];  Service: Endoscopy;  Laterality: N/A;   EYE SURGERY     FLEXIBLE SIGMOIDOSCOPY N/A 2020   prep good   HERNIA REPAIR   2005   In [place]   INCISIONAL HERNIA REPAIR N/A 02/20/2014   Procedure: LAP ASSISTED INCISIONAL HERNIA REPAIR LYSIS OF ADHESIONS;  Surgeon: Leighton Ruff, MD;  [facility];  Service: General;  Laterality: N/A;  converted to open @ [place] N/A 02/20/2014   Procedure: HERNIA REPAIR INCISIONAL;  Surgeon: Leighton Ruff, MD;  [facility];  Service: General;  Laterality: N/A;  With MESH   INTRAOCULAR LENS INSERTION Bilateral    6 yrs ago   TOTAL KNEE ARTHROPLASTY Right 11/11/2021   Procedure: RIGHT TOTAL KNEE ARTHROPLASTY;  Surgeon: Leandrew Koyanagi, MD;  Location: [place];  Service: Orthopedics;  Laterality: Right;   VENTRAL HERNIA REPAIR     Patient Active Problem List   Diagnosis Date Noted   Primary osteoarthritis of right knee 11/11/2021   Status post total right knee replacement 11/11/2021   AKI (acute kidney injury) ([place]) 04/21/2019   Pneumonia due to COVID-19 virus 04/21/2019   Osteoporosis    Ulcerative colitis, with rectal bleeding 08/06/2015   Gastroesophageal reflux disease without esophagitis 08/06/2015   Visual impairment of right eye 06/18/2015  Essential hypertension, benign 06/18/2015   Hernia of abdominal cavity 11/27/2014   Bipolar 1 disorder, mixed, moderate (HCC) 11/27/2014   Cellulitis 08/23/2014   Medically noncompliant 08/23/2014   Left leg cellulitis 08/23/2014   Cellulitis of left leg    Hypokalemia    Diarrhea 05/06/2014   Ulcerative colitis ([place]) 04/17/2014   Verrucous keratosis 04/17/2014   Nonspecific (abnormal) findings on radiological and other examination of gastrointestinal tract 07/18/2013   Acute esophagitis 07/18/2013   Hypovitaminosis D 05/06/2013   Ulcerative colitis, unspecified 04/04/2013   Knee pain, bilateral 03/05/2013    REFERRING DIAG: Z96.651 (ICD-10-CM) - Status post total right knee replacement M17.11 (ICD-10-CM) - Primary osteoarthritis of right knee  THERAPY DIAG:  Acute pain of right  knee  Muscle weakness (generalized)  Other abnormalities of gait and mobility  Rationale for Evaluation and Treatment Rehabilitation  PERTINENT HISTORY: Osteoporosis; bipolar disorder   PRECAUTIONS: None  ONSET DATE: 11/11/2021  SUBJECTIVE: Patient reports no current pain, just stiffness.  PAIN:  Are you having pain?  Yes: NPRS scale: 0/10 (5/10 at worst, describes more stiffness than pain) Pain location: R knee Pain description: stiff, sharp Aggravating factors: prolonged position Relieving factors: movement, ice  OBJECTIVE: (objective measures completed at initial evaluation unless otherwise dated)  DIAGNOSTIC FINDINGS:            See imaging   PATIENT SURVEYS:  FOTO: 47% function; 64% predicted    COGNITION:           Overall cognitive status: Within functional limits for tasks assessed                          SENSATION: Light touch: decreased light touch in lateral R tibaia   POSTURE:  rounded shoulders and forward head   PALPATION: Slight TTP to distal R quad   LOWER EXTREMITY ROM:      AROM Right 12/02/2021   Right 12/08/2021  Right  12/16/2021 Right 12/20/2021  Right 12/22/2021 Right 01/12/2022 Right 01/19/2022  Knee flexion 98  99 110 130 AROM 108 105 AROM 106 AROM  Knee extension _0 (Blank rows = not tested)     LOWER EXTREMITY MMT:     MMT Right 12/02/2021  Left 12/02/2021   Hip flexion       Hip extension      Hip abduction      Hip adduction      Hip external rotation      Hip internal rotation      Knee extension DNT    Knee flexion DNT    Ankle dorsiflexion       Ankle plantarflexion      Ankle inversion      Ankle eversion      Grossly      (Blank rows = not tested)   FUNCTIONAL TESTS:  30 Second Sit to Stand: 14 reps with bilateral UE support   GAIT: Distance walked: 56f Assistive device utilized: WEnvironmental consultant- 2 wheeled Level of assistance: Complete Independence Comments: decreased gait speed,  antalgic gait, decrease R knee ext   TODAY'S TREATMENT: [facility] Adult PT Treatment:  DATE: 01/19/2022 Therapeutic Exercise: Bike x5 mins full revolutions Slant board gastroc stretch 2' Knee flexion stretch on 12" stacked boxes x20 Sit to stand 10 x on airex no UE support LAQ 4# 2x10 with 3" hold at top Step up 6" step R LE lead  2 x 10  Lateral step up 6" step R LE lead  2 x 10  Heel/toe raises 3x10 Seated hamstring stretch 2x30" Rt Heel taps Rt on 2" step 3x10 Heel slides 5" hold x10 Manual Therapy: Short sitting with OP into flexion x 10 - 10" hold   [facility] Adult PT Treatment:                                                DATE: 01/12/2022 Therapeutic Exercise: Bike x5 mins full revolutions Slant board gastroc stretch 2' Knee flexion stretch on 12" stacked boxes x20 Sit to stand 10 x on airex LAQ 4# 2x10 with 3" hold at top Step up 6" step R LE lead  2 x 10  Lateral step up 6" step R LE lead  2 x 10  Heel/toe raises 3x10 Seated hamstring stretch 2x30" Rt Manual Therapy: AP mobs in supine Short sitting with OP into flexion x 10 - 5" hold    [facility] Adult PT Treatment:                                                DATE: 01/05/2022 Therapeutic Exercise: Bike x5 mins full revolutions Slant board gastroc stretch 2' Sit to stand 10 x on airex Seated hamstring curl BlueTB 2x10 LAQ 4# 2x10 with 3" hold at top Step up 6" step R LE lead  2 x 10  Lateral step up 6" step R LE lead  2 x 10  Heel/toe raises 3x10 TKE against BTB 2x10 Seated hamstring stretch 3x30" Rt  PATIENT EDUCATION:  Education details: eval findings, FOTO, HEP, POC Person educated: Patient Education method: Explanation, Demonstration, and Handouts Education comprehension: verbalized understanding and returned demonstration     HOME EXERCISE PROGRAM: Access Code: [email or web address] URL: [email or web address] Date: 12/02/2021 Prepared by: Octavio Manns    Exercises - Seated Hamstring Stretch  - 2-3 x daily - 7 x weekly - 2-3 reps - 30 sec hold - Seated Heel Slide  - 2-3 x daily - 7 x weekly - 2-3 sets - 10 reps - 5 hold - Supine Quad Set  - 2-3 x daily - 7 x weekly - 2-3 sets - 10 reps - Active Straight Leg Raise with Quad Set  - 2-3 x daily - 7 x weekly - 2-3 sets - 10 reps - Supine Heel Slide  - 2-3 x daily - 7 x weekly - 2-3 sets - 10 reps - 5 sec hold   ASSESSMENT:   CLINICAL IMPRESSION: Patient presents to PT with stiffness in his Rt knee and reports HEP compliance. Session today continued to focus on Rt knee strengthening and ROM, particular focus on flexion. His AROM has improved to 106. Utilized manual techniques as well to improve flexion ROM and decrease tissue restriction. He remains somewhat limited by fatigue throughout session, needing seated rest breaks.  Patient continues to benefit from skilled PT services and should be progressed as  able to improve functional independence.    OBJECTIVE IMPAIRMENTS Abnormal gait, decreased balance, decreased endurance, decreased mobility, difficulty walking, decreased ROM, decreased strength, and pain.    ACTIVITY LIMITATIONS carrying, lifting, squatting, stairs, and transfers   PARTICIPATION LIMITATIONS: meal prep, cleaning, and community activity   PERSONAL FACTORS 1-2 comorbidities: Osteoporosis; bipolar disorder   are also affecting patient's functional outcome.    REHAB POTENTIAL: Excellent   CLINICAL DECISION MAKING: Stable/uncomplicated   EVALUATION COMPLEXITY: Low     GOALS: Goals reviewed with patient? No   SHORT TERM GOALS: Target date: 12/23/2021  Pt will be compliant and knowledgeable with initial HEP for improved comfort and carryover Baseline: initial HEP given Goal status: MET 12/27/2021   2.  Pt will self report right knee pain no greater than 6/10 for improved comfort and functional ability Baseline: 8/10 at worst Goal status: MET 12/27/2021   LONG TERM  GOALS: Target date: 02/24/2022    Pt will self report right knee pain no greater than 1-2/10 for improved comfort and functional ability Baseline: 8/10 at worst Goal status: INITIAL   2.  Pt will improve FOTO function score to no less than 64% as proxy for functional improvement Baseline: 47% function Goal status: INITIAL   3.  Pt will improve R knee AROM to range on 0-12 degrees for improved functional mobility and community navigation Baseline: see chart Goal status: In progress 3-108   4.  Pt will increase 30 Second Sit to Stand rep count to no less than 15 reps with no UE supportfor improved balance, strength, and functional mobility Baseline: 14 reps with bilateral UE support  Goal status: INITIAL    5.  Pt will be able to ambulate 1048f with LRAD for improved functional mobility and safety with community activites Baseline: unable Goal status: INITIAL   PLAN: PT FREQUENCY: 2x/week   PT DURATION: 12 weeks   PLANNED INTERVENTIONS: Therapeutic exercises, Therapeutic activity, Neuromuscular re-education, Balance training, Gait training, Patient/Family education, Joint mobilization, Aquatic Therapy, Dry Needling, Electrical stimulation, Cryotherapy, Moist heat, Taping, Vasopneumatic device, Manual therapy, and Re-evaluation   PLAN FOR NEXT SESSION: assess HEP response, progress LE strength and R knee ROM, Step up's.     SMargarette Canada PTA 01/19/22 11:26 AM

## 2022-01-19 ENCOUNTER — Ambulatory Visit: Payer: Medicare HMO

## 2022-01-19 DIAGNOSIS — M25561 Pain in right knee: Secondary | ICD-10-CM | POA: Diagnosis not present

## 2022-01-19 DIAGNOSIS — M6281 Muscle weakness (generalized): Secondary | ICD-10-CM

## 2022-01-19 DIAGNOSIS — R2689 Other abnormalities of gait and mobility: Secondary | ICD-10-CM | POA: Diagnosis not present

## 2022-01-26 ENCOUNTER — Ambulatory Visit: Payer: Medicare HMO

## 2022-01-26 DIAGNOSIS — M6281 Muscle weakness (generalized): Secondary | ICD-10-CM

## 2022-01-26 DIAGNOSIS — R2689 Other abnormalities of gait and mobility: Secondary | ICD-10-CM

## 2022-01-26 DIAGNOSIS — M25561 Pain in right knee: Secondary | ICD-10-CM | POA: Diagnosis not present

## 2022-01-26 NOTE — Therapy (Signed)
OUTPATIENT PHYSICAL THERAPY TREATMENT NOTE   Patient Name: Robert Rhodes MRN: [id number] DOB:24-Aug-1945, 76 y.o., male Today's Date: 01/26/2022  PCP: [name], MD  REFERRING PROVIDER: Leandrew Koyanagi, MD  END OF SESSION:     PT End of Session - 01/26/22 1259     Visit Number 12    Number of Visits 24    Date for PT Re-Evaluation 02/24/22    Authorization Type [employer] MCR    Authorization - Visit Number 12    Authorization - Number of Visits 12    PT Start Time 1300    PT Stop Time 1340    PT Time Calculation (min) 40 min    Activity Tolerance Patient tolerated treatment well;Patient limited by fatigue    Behavior During Therapy [facility] for tasks assessed/performed               Past Medical History:  Diagnosis Date   Adenomatous colon polyp    Arthritis    bilateral knees-needs replacements   Claustrophobia    Colon cancer ([place]) 2015   COVID-19    Eye abnormalities    right eye seen in ED 02/17/2014 wearing eye patch using eye drops   GERD (gastroesophageal reflux disease)    with certain foods   Glaucoma    not on eye drops at this time (10/21/2020)   Hypertension    on meds   Irregular heartbeat    per pt/doesn't know what it does   Osteoarthritis    bilat knees  needs replacement   Osteoporosis    pt denies   Substance abuse ([place])    stopped over 3 years ago   UC (ulcerative colitis) ([place])    Vitamin D deficiency    Past Surgical History:  Procedure Laterality Date   COLON RESECTION N/A 08/23/2013   Procedure: Laparoscopic total abdominal colectomy and hernia repair;  Surgeon: Leighton Ruff, MD;  [facility];  Service: General;  Laterality: N/A;   COLON SURGERY  08/2013   COLONOSCOPY  11/02/2020   EUS N/A 07/18/2013   Procedure: UPPER ENDOSCOPIC ULTRASOUND (EUS) RADIAL;  Surgeon: Milus Banister, MD;  [facility];  Service: Endoscopy;  Laterality: N/A;   EYE SURGERY     FLEXIBLE SIGMOIDOSCOPY N/A 2020   prep good   HERNIA  REPAIR  2005   [place] in [place]   INCISIONAL HERNIA REPAIR N/A 02/20/2014   Procedure: LAP ASSISTED INCISIONAL HERNIA REPAIR LYSIS OF ADHESIONS;  Surgeon: Leighton Ruff, MD;  [facility];  Service: General;  Laterality: N/A;  converted to open @ [place] N/A 02/20/2014   Procedure: HERNIA REPAIR INCISIONAL;  Surgeon: Leighton Ruff, MD;  [facility];  Service: General;  Laterality: N/A;  With MESH   INTRAOCULAR LENS INSERTION Bilateral    6 yrs ago   TOTAL KNEE ARTHROPLASTY Right 11/11/2021   Procedure: RIGHT TOTAL KNEE ARTHROPLASTY;  Surgeon: Leandrew Koyanagi, MD;  Location: [place];  Service: Orthopedics;  Laterality: Right;   VENTRAL HERNIA REPAIR     Patient Active Problem List   Diagnosis Date Noted   Primary osteoarthritis of right knee 11/11/2021   Status post total right knee replacement 11/11/2021   AKI (acute kidney injury) ([place]) 04/21/2019   Pneumonia due to COVID-19 virus 04/21/2019   Osteoporosis    Ulcerative colitis, with rectal bleeding 08/06/2015   Gastroesophageal reflux disease without esophagitis 08/06/2015   Visual impairment of right eye 06/18/2015  Essential hypertension, benign 06/18/2015   Hernia of abdominal cavity 11/27/2014   Bipolar 1 disorder, mixed, moderate (HCC) 11/27/2014   Cellulitis 08/23/2014   Medically noncompliant 08/23/2014   Left leg cellulitis 08/23/2014   Cellulitis of left leg    Hypokalemia    Diarrhea 05/06/2014   Ulcerative colitis ([place]) 04/17/2014   Verrucous keratosis 04/17/2014   Nonspecific (abnormal) findings on radiological and other examination of gastrointestinal tract 07/18/2013   Acute esophagitis 07/18/2013   Hypovitaminosis D 05/06/2013   Ulcerative colitis, unspecified 04/04/2013   Knee pain, bilateral 03/05/2013    REFERRING DIAG: Z96.651 (ICD-10-CM) - Status post total right knee replacement M17.11 (ICD-10-CM) - Primary osteoarthritis of right knee  THERAPY DIAG:  Acute pain of right  knee  Muscle weakness (generalized)  Other abnormalities of gait and mobility  Rationale for Evaluation and Treatment Rehabilitation  PERTINENT HISTORY: Osteoporosis; bipolar disorder   PRECAUTIONS: None  ONSET DATE: 11/11/2021  SUBJECTIVE: Patient reports that he has some mild pain, but mostly stiffness in the Rt knee.   PAIN:  Are you having pain?  Yes: NPRS scale: 1/10 (5/10 at worst, describes more stiffness than pain) Pain location: R knee Pain description: stiff, sharp Aggravating factors: prolonged position Relieving factors: movement, ice  OBJECTIVE: (objective measures completed at initial evaluation unless otherwise dated)  DIAGNOSTIC FINDINGS:            See imaging   PATIENT SURVEYS:  FOTO: 47% function; 64% predicted    COGNITION:           Overall cognitive status: Within functional limits for tasks assessed                          SENSATION: Light touch: decreased light touch in lateral R tibaia   POSTURE:  rounded shoulders and forward head   PALPATION: Slight TTP to distal R quad   LOWER EXTREMITY ROM:      AROM Right 12/02/2021   Right 12/08/2021  Right  12/16/2021 Right 12/20/2021  Right 12/22/2021 Right 01/12/2022 Right 01/19/2022 Right 01/26/22  Knee flexion 98  99 110 130 AROM 108 105 AROM 106 AROM 108 AROM  Knee extension 7  6 3 3 3 3                           (Blank rows = not tested)     LOWER EXTREMITY MMT:     MMT Right 12/02/2021  Left 12/02/2021   Hip flexion       Hip extension      Hip abduction      Hip adduction      Hip external rotation      Hip internal rotation      Knee extension DNT    Knee flexion DNT    Ankle dorsiflexion       Ankle plantarflexion      Ankle inversion      Ankle eversion      Grossly      (Blank rows = not tested)   FUNCTIONAL TESTS:  30 Second Sit to Stand: 14 reps with bilateral UE support   GAIT: Distance walked: 9f Assistive device utilized: WEnvironmental consultant- 2 wheeled Level of  assistance: Complete Independence Comments: decreased gait speed, antalgic gait, decrease R knee ext   TODAY'S TREATMENT: [facility] Adult PT Treatment:  DATE: 01/26/2022 Therapeutic Exercise: Bike x5 mins full revolutions Slant board gastroc stretch 2x1" Knee flexion stretch on 12" stacked boxes x20 Sit to stand 10 x on airex no UE support Step up 6" step R LE lead  2 x 10  Lateral step up 6" step R LE lead  2 x 10  Seated hamstring stretch 2x30" Rt Heel taps Rt on 2" step 3x10 Heel slides 5" hold x10 Prone quad stretch x1' Hamstring curls BlueTB 2x10 Rt Manual Therapy: Short sitting with OP into flexion x 10 - 10" hold   [facility] Adult PT Treatment:                                                DATE: 01/19/2022 Therapeutic Exercise: Bike x5 mins full revolutions Slant board gastroc stretch 2' Knee flexion stretch on 12" stacked boxes x20 Sit to stand 10 x on airex no UE support LAQ 4# 2x10 with 3" hold at top Step up 6" step R LE lead  2 x 10  Lateral step up 6" step R LE lead  2 x 10  Heel/toe raises 3x10 Seated hamstring stretch 2x30" Rt Heel taps Rt on 2" step 3x10 Heel slides 5" hold x10 Manual Therapy: Short sitting with OP into flexion x 10 - 10" hold   [facility] Adult PT Treatment:                                                DATE: 01/12/2022 Therapeutic Exercise: Bike x5 mins full revolutions Slant board gastroc stretch 2' Knee flexion stretch on 12" stacked boxes x20 Sit to stand 10 x on airex LAQ 4# 2x10 with 3" hold at top Step up 6" step R LE lead  2 x 10  Lateral step up 6" step R LE lead  2 x 10  Heel/toe raises 3x10 Seated hamstring stretch 2x30" Rt Manual Therapy: AP mobs in supine Short sitting with OP into flexion x 10 - 5" hold    PATIENT EDUCATION:  Education details: eval findings, FOTO, HEP, POC Person educated: Patient Education method: Explanation, Demonstration, and Handouts Education comprehension:  verbalized understanding and returned demonstration     HOME EXERCISE PROGRAM: Access Code: [email or web address] URL: [email or web address] Date: 12/02/2021 Prepared by: Octavio Manns   Exercises - Seated Hamstring Stretch  - 2-3 x daily - 7 x weekly - 2-3 reps - 30 sec hold - Seated Heel Slide  - 2-3 x daily - 7 x weekly - 2-3 sets - 10 reps - 5 hold - Supine Quad Set  - 2-3 x daily - 7 x weekly - 2-3 sets - 10 reps - Active Straight Leg Raise with Quad Set  - 2-3 x daily - 7 x weekly - 2-3 sets - 10 reps - Supine Heel Slide  - 2-3 x daily - 7 x weekly - 2-3 sets - 10 reps - 5 sec hold   ASSESSMENT:   CLINICAL IMPRESSION: Patient presents to PT with mild pain and stiffness in his Rt knee and reports HEP compliance. Session today continued to focus on Rt knee strengthening and ROM, with particular focus on knee flexion ROM. AROM this session 108. Patient was able to tolerate all prescribed exercises with  no adverse effects. Patient continues to benefit from skilled PT services and should be progressed as able to improve functional independence.   OBJECTIVE IMPAIRMENTS Abnormal gait, decreased balance, decreased endurance, decreased mobility, difficulty walking, decreased ROM, decreased strength, and pain.    ACTIVITY LIMITATIONS carrying, lifting, squatting, stairs, and transfers   PARTICIPATION LIMITATIONS: meal prep, cleaning, and community activity   PERSONAL FACTORS 1-2 comorbidities: Osteoporosis; bipolar disorder   are also affecting patient's functional outcome.    REHAB POTENTIAL: Excellent   CLINICAL DECISION MAKING: Stable/uncomplicated   EVALUATION COMPLEXITY: Low     GOALS: Goals reviewed with patient? No   SHORT TERM GOALS: Target date: 12/23/2021  Pt will be compliant and knowledgeable with initial HEP for improved comfort and carryover Baseline: initial HEP given Goal status: MET 12/27/2021   2.  Pt will self report right knee pain no greater than 6/10 for  improved comfort and functional ability Baseline: 8/10 at worst Goal status: MET 12/27/2021   LONG TERM GOALS: Target date: 02/24/2022    Pt will self report right knee pain no greater than 1-2/10 for improved comfort and functional ability Baseline: 8/10 at worst Goal status: INITIAL   2.  Pt will improve FOTO function score to no less than 64% as proxy for functional improvement Baseline: 47% function Goal status: INITIAL   3.  Pt will improve R knee AROM to range on 0-12 degrees for improved functional mobility and community navigation Baseline: see chart Goal status: In progress 3-108   4.  Pt will increase 30 Second Sit to Stand rep count to no less than 15 reps with no UE supportfor improved balance, strength, and functional mobility Baseline: 14 reps with bilateral UE support  Goal status: INITIAL    5.  Pt will be able to ambulate 1031f with LRAD for improved functional mobility and safety with community activites Baseline: unable Goal status: INITIAL   PLAN: PT FREQUENCY: 2x/week   PT DURATION: 12 weeks   PLANNED INTERVENTIONS: Therapeutic exercises, Therapeutic activity, Neuromuscular re-education, Balance training, Gait training, Patient/Family education, Joint mobilization, Aquatic Therapy, Dry Needling, Electrical stimulation, Cryotherapy, Moist heat, Taping, Vasopneumatic device, Manual therapy, and Re-evaluation   PLAN FOR NEXT SESSION: assess HEP response, progress LE strength and R knee ROM, Step up's.     SMargarette Canada PTA 01/26/22 1:41 PM

## 2022-02-01 ENCOUNTER — Ambulatory Visit: Payer: Medicare HMO

## 2022-02-01 DIAGNOSIS — R2689 Other abnormalities of gait and mobility: Secondary | ICD-10-CM | POA: Diagnosis not present

## 2022-02-01 DIAGNOSIS — M6281 Muscle weakness (generalized): Secondary | ICD-10-CM

## 2022-02-01 DIAGNOSIS — M25561 Pain in right knee: Secondary | ICD-10-CM | POA: Diagnosis not present

## 2022-02-01 NOTE — Therapy (Signed)
OUTPATIENT PHYSICAL THERAPY TREATMENT NOTE   Patient Name: Robert Rhodes MRN: [id number] DOB:August 30, 1945, 76 y.o., male Today's Date: 02/01/2022  PCP: [name], MD  REFERRING PROVIDER: Leandrew Koyanagi, MD  END OF SESSION:     PT End of Session - 02/01/22 1358     Visit Number 13    Number of Visits 24    Date for PT Re-Evaluation 02/24/22    Authorization Type [employer] M[employer]    Authorization - Visit Number 13    Authorization - Number of Visits 12    PT Start Time 1400    PT Stop Time 7939    PT Time Calculation (min) 42 min    Activity Tolerance Patient tolerated treatment well;Patient limited by fatigue    Behavior During Therapy WFL for tasks assessed/performed                Past Medical History:  Diagnosis Date   Adenomatous colon polyp    Arthritis    bilateral knees-needs replacements   Claustrophobia    Colon cancer ([place]) 2015   COVID-19    Eye abnormalities    right eye seen in ED 02/17/2014 wearing eye patch using eye drops   GERD (gastroesophageal reflux disease)    with certain foods   Glaucoma    not on eye drops at this time (10/21/2020)   Hypertension    on meds   Irregular heartbeat    per pt/doesn't know what it does   Osteoarthritis    bilat knees  needs replacement   Osteoporosis    pt denies   Substance abuse ([place])    stopped over 3 years ago   UC (ulcerative colitis) ([place])    Vitamin D deficiency    Past Surgical History:  Procedure Laterality Date   COLON RESECTION N/A 08/23/2013   Procedure: Laparoscopic total abdominal colectomy and hernia repair;  Surgeon: Leighton Ruff, MD;  Location: [facility];  Service: General;  Laterality: N/A;   COLON SURGERY  08/2013   COLONOSCOPY  11/02/2020   EUS N/A 07/18/2013   Procedure: UPPER ENDOSCOPIC ULTRASOUND (EUS) RADIAL;  Surgeon: Milus Banister, MD;  [facility]: [facility];  Service: Endoscopy;  Laterality: N/A;   EYE SURGERY     FLEXIBLE SIGMOIDOSCOPY N/A 2020   prep good   HERNIA  REPAIR  2005   In [place]   INCISIONAL HERNIA REPAIR N/A 02/20/2014   Procedure: LAP ASSISTED INCISIONAL HERNIA REPAIR LYSIS OF ADHESIONS;  Surgeon: Leighton Ruff, MD;  Location: [facility];  Service: General;  Laterality: N/A;  converted to open @ [place] N/A 02/20/2014   Procedure: HERNIA REPAIR INCISIONAL;  Surgeon: Leighton Ruff, MD;  Location: [facility];  Service: General;  Laterality: N/A;  With MESH   INTRAOCULAR LENS INSERTION Bilateral    6 yrs ago   TOTAL KNEE ARTHROPLASTY Right 11/11/2021   Procedure: RIGHT TOTAL KNEE ARTHROPLASTY;  Surgeon: Leandrew Koyanagi, MD;  Location: [place];  Service: Orthopedics;  Laterality: Right;   VENTRAL HERNIA REPAIR     Patient Active Problem List   Diagnosis Date Noted   Primary osteoarthritis of right knee 11/11/2021   Status post total right knee replacement 11/11/2021   AKI (acute kidney injury) ([place]) 04/21/2019   Pneumonia due to COVID-19 virus 04/21/2019   Osteoporosis    Ulcerative colitis, with rectal bleeding 08/06/2015   Gastroesophageal reflux disease without esophagitis 08/06/2015   Visual impairment of right eye  06/18/2015   Essential hypertension, benign 06/18/2015   Hernia of abdominal cavity 11/27/2014   Bipolar 1 disorder, mixed, moderate ([place]) 11/27/2014   Cellulitis 08/23/2014   Medically noncompliant 08/23/2014   Left leg cellulitis 08/23/2014   Cellulitis of left leg    Hypokalemia    Diarrhea 05/06/2014   Ulcerative colitis ([place]) 04/17/2014   Verrucous keratosis 04/17/2014   Nonspecific (abnormal) findings on radiological and other examination of gastrointestinal tract 07/18/2013   Acute esophagitis 07/18/2013   Hypovitaminosis D 05/06/2013   Ulcerative colitis, unspecified 04/04/2013   Knee pain, bilateral 03/05/2013    REFERRING DIAG: Z96.651 (ICD-10-CM) - Status post total right knee replacement M17.11 (ICD-10-CM) - Primary osteoarthritis of right knee  THERAPY DIAG:  Acute pain of right  knee  Muscle weakness (generalized)  Other abnormalities of gait and mobility  Rationale for Evaluation and Treatment Rehabilitation  PERTINENT HISTORY: Osteoporosis; bipolar disorder   PRECAUTIONS: None  ONSET DATE: 11/11/2021  SUBJECTIVE:  Pt presents to PT with reports of R knee stiffness, not too much pain. Has been compliant with HEP with no adverse effect. Pt is ready to begin PT treatment at this time.   PAIN:  Are you having pain?  Yes: NPRS scale: 1/10 (5/10 at worst, describes more stiffness than pain) Pain location: R knee Pain description: stiff, sharp Aggravating factors: prolonged position Relieving factors: movement, ice  OBJECTIVE: (objective measures completed at initial evaluation unless otherwise dated)  DIAGNOSTIC FINDINGS:            See imaging   PATIENT SURVEYS:  FOTO: 47% function; 64% predicted    COGNITION:           Overall cognitive status: Within functional limits for tasks assessed                          SENSATION: Light touch: decreased light touch in lateral R tibaia   POSTURE:  rounded shoulders and forward head   PALPATION: Slight TTP to distal R quad   LOWER EXTREMITY ROM:      AROM Right 12/22/2021 Right 01/12/2022 Right 01/19/2022 Right 01/26/22 Right 02/01/22  Knee flexion AROM 108 105 AROM 106 AROM 108 AROM 110 AROM  Knee extension 3 3                           (Blank rows = not tested)     LOWER EXTREMITY MMT:     MMT Right 12/02/2021  Left 12/02/2021   Hip flexion       Hip extension      Hip abduction      Hip adduction      Hip external rotation      Hip internal rotation      Knee extension DNT    Knee flexion DNT    Ankle dorsiflexion       Ankle plantarflexion      Ankle inversion      Ankle eversion      Grossly      (Blank rows = not tested)   FUNCTIONAL TESTS:  30 Second Sit to Stand: 14 reps with bilateral UE support   GAIT: Distance walked: 78f Assistive device utilized: WEnvironmental consultant- 2  wheeled Level of assistance: Complete Independence Comments: decreased gait speed, antalgic gait, decrease R knee ext   TODAY'S TREATMENT: [facility] Adult PT Treatment:  DATE: 02/01/2022 Therapeutic Exercise: Bike L2 x 5 mins full revolutions Slant board gastroc stretch x 60" Knee flexion stretch on 12" stacked boxes 2x30" Seated knee ext 2x10 5# R only Seated knee flexion 2x10 15# Sit to stand 2x10 on airex no UE support Step up 8" step R LE lead  x 20 Lateral step up 6" step R LE lead  2 x 10  Seated hamstring stretch 2x30" Rt Supine SLR 2x15 5# R Manual Therapy: Short sitting with OP into flexion x 10 - 10" hold  [facility] Adult PT Treatment:                                                DATE: 01/26/2022 Therapeutic Exercise: Bike x5 mins full revolutions Slant board gastroc stretch 2x1" Knee flexion stretch on 12" stacked boxes x20 Sit to stand 10 x on airex no UE support Step up 6" step R LE lead  2 x 10  Lateral step up 6" step R LE lead  2 x 10  Seated hamstring stretch 2x30" Rt Heel taps Rt on 2" step 3x10 Heel slides 5" hold x10 Prone quad stretch x1' Hamstring curls BlueTB 2x10 Rt Manual Therapy: Short sitting with OP into flexion x 10 - 10" hold   [facility] Adult PT Treatment:                                                DATE: 01/19/2022 Therapeutic Exercise: Bike x5 mins full revolutions Slant board gastroc stretch 2' Knee flexion stretch on 12" stacked boxes x20 Sit to stand 10 x on airex no UE support LAQ 4# 2x10 with 3" hold at top Step up 6" step R LE lead  2 x 10  Lateral step up 6" step R LE lead  2 x 10  Heel/toe raises 3x10 Seated hamstring stretch 2x30" Rt Heel taps Rt on 2" step 3x10 Heel slides 5" hold x10 Manual Therapy: Short sitting with OP into flexion x 10 - 10" hold   PATIENT EDUCATION:  Education details: continue HEP Person educated: Patient Education method: Explanation, Demonstration, and  Handouts Education comprehension: verbalized understanding and returned demonstration     HOME EXERCISE PROGRAM: Access Code: [email or web address] URL: [email or web address] Date: 12/02/2021 Prepared by: Octavio Manns   Exercises - Seated Hamstring Stretch  - 2-3 x daily - 7 x weekly - 2-3 reps - 30 sec hold - Seated Heel Slide  - 2-3 x daily - 7 x weekly - 2-3 sets - 10 reps - 5 hold - Supine Quad Set  - 2-3 x daily - 7 x weekly - 2-3 sets - 10 reps - Active Straight Leg Raise with Quad Set  - 2-3 x daily - 7 x weekly - 2-3 sets - 10 reps - Supine Heel Slide  - 2-3 x daily - 7 x weekly - 2-3 sets - 10 reps - 5 sec hold   ASSESSMENT:   CLINICAL IMPRESSION: Pt was able to complete all prescribed exercises with no adverse effect or increase in pain. Therapy focused on improving LE strength and R knee ROM. He is progressing very well and will continues to improve with therapy. Pt has f/u visit with MD later this week and  will continue to be seen and progressed as able.   OBJECTIVE IMPAIRMENTS Abnormal gait, decreased balance, decreased endurance, decreased mobility, difficulty walking, decreased ROM, decreased strength, and pain.    ACTIVITY LIMITATIONS carrying, lifting, squatting, stairs, and transfers   PARTICIPATION LIMITATIONS: meal prep, cleaning, and community activity   PERSONAL FACTORS 1-2 comorbidities: Osteoporosis; bipolar disorder   are also affecting patient's functional outcome.      GOALS: Goals reviewed with patient? No   SHORT TERM GOALS: Target date: 12/23/2021  Pt will be compliant and knowledgeable with initial HEP for improved comfort and carryover Baseline: initial HEP given Goal status: MET 12/27/2021   2.  Pt will self report right knee pain no greater than 6/10 for improved comfort and functional ability Baseline: 8/10 at worst Goal status: MET 12/27/2021   LONG TERM GOALS: Target date: 02/24/2022    Pt will self report right knee pain no greater  than 1-2/10 for improved comfort and functional ability Baseline: 8/10 at worst Goal status: INITIAL   2.  Pt will improve FOTO function score to no less than 64% as proxy for functional improvement Baseline: 47% function Goal status: INITIAL   3.  Pt will improve R knee AROM to range on 0-12 degrees for improved functional mobility and community navigation Baseline: see chart Goal status: In progress 3-108   4.  Pt will increase 30 Second Sit to Stand rep count to no less than 15 reps with no UE supportfor improved balance, strength, and functional mobility Baseline: 14 reps with bilateral UE support  Goal status: INITIAL    5.  Pt will be able to ambulate 1071f with LRAD for improved functional mobility and safety with community activites Baseline: unable Goal status: INITIAL   PLAN: PT FREQUENCY: 2x/week   PT DURATION: 12 weeks   PLANNED INTERVENTIONS: Therapeutic exercises, Therapeutic activity, Neuromuscular re-education, Balance training, Gait training, Patient/Family education, Joint mobilization, Aquatic Therapy, Dry Needling, Electrical stimulation, Cryotherapy, Moist heat, Taping, Vasopneumatic device, Manual therapy, and Re-evaluation   PLAN FOR NEXT SESSION: assess HEP response, progress LE strength and R knee ROM, Step up's.     DWard ChattersPT  02/01/22 3:18 PM

## 2022-02-04 ENCOUNTER — Telehealth: Payer: Self-pay | Admitting: *Deleted

## 2022-02-04 ENCOUNTER — Encounter: Payer: Self-pay | Admitting: Orthopaedic Surgery

## 2022-02-04 ENCOUNTER — Ambulatory Visit (INDEPENDENT_AMBULATORY_CARE_PROVIDER_SITE_OTHER): Payer: Medicare HMO | Admitting: Orthopaedic Surgery

## 2022-02-04 DIAGNOSIS — Z96651 Presence of right artificial knee joint: Secondary | ICD-10-CM

## 2022-02-04 NOTE — Progress Notes (Signed)
Post-Op Visit Note   Patient: Robert Rhodes           Date of Birth: 1945-09-13           MRN: [id number] Visit Date: 02/04/2022 PCP: [name], MD   Assessment & Plan:  Chief Complaint:  Chief Complaint  Patient presents with   Right Knee - Follow-up    Right total knee arthroplasty 11/11/2021   Visit Diagnoses:  1. Status post total right knee replacement     Plan: Robert Rhodes is 3 months status post right total knee on 11/11/2021.  He is doing well.  He is very eager to have his left knee replaced.  He feels that he has fully recovered from the right knee replacement.  Examination of the right knee shows a fully healed surgical scar.  Excellent range of motion.  Stable to varus valgus.  Robert Rhodes has made a very good recovery from his knee replacement.  He would like to move forward with a left total knee replacement at the end of next month.  I think this is very reasonable and we will give him another month to get stronger in his right leg.  Near future to schedule surgery.  Follow-Up Instructions: Return in about 3 months (around 05/07/2022).   Orders:  No orders of the defined types were placed in this encounter.  No orders of the defined types were placed in this encounter.   Imaging: No results found.  PMFS History: Patient Active Problem List   Diagnosis Date Noted   Primary osteoarthritis of right knee 11/11/2021   Status post total right knee replacement 11/11/2021   AKI (acute kidney injury) ([place]) 04/21/2019   Pneumonia due to COVID-19 virus 04/21/2019   Osteoporosis    Ulcerative colitis, with rectal bleeding 08/06/2015   Gastroesophageal reflux disease without esophagitis 08/06/2015   Visual impairment of right eye 06/18/2015   Essential hypertension, benign 06/18/2015   Hernia of abdominal cavity 11/27/2014   Bipolar 1 disorder, mixed, moderate ([place]) 11/27/2014   Cellulitis 08/23/2014   Medically noncompliant 08/23/2014   Left leg  cellulitis 08/23/2014   Cellulitis of left leg    Hypokalemia    Diarrhea 05/06/2014   Ulcerative colitis ([place]) 04/17/2014   Verrucous keratosis 04/17/2014   Nonspecific (abnormal) findings on radiological and other examination of gastrointestinal tract 07/18/2013   Acute esophagitis 07/18/2013   Hypovitaminosis D 05/06/2013   Ulcerative colitis, unspecified 04/04/2013   Knee pain, bilateral 03/05/2013   Past Medical History:  Diagnosis Date   Adenomatous colon polyp    Arthritis    bilateral knees-needs replacements   Claustrophobia    Colon cancer ([place]) 2015   COVID-19    Eye abnormalities    right eye seen in ED 02/17/2014 wearing eye patch using eye drops   GERD (gastroesophageal reflux disease)    with certain foods   Glaucoma    not on eye drops at this time (10/21/2020)   Hypertension    on meds   Irregular heartbeat    per pt/doesn't know what it does   Osteoarthritis    bilat knees  needs replacement   Osteoporosis    pt denies   Substance abuse ([place])    stopped over 3 years ago   UC (ulcerative colitis) ([place])    Vitamin D deficiency     Family History  Problem Relation Age of Onset   Cancer Mother        type unknown-in her  leg   Diabetes Father    Heart disease Father    Heart disease Sister    Heart attack Brother    Healthy Sister    Colon cancer Neg Hx    Esophageal cancer Neg Hx    Rectal cancer Neg Hx    Stomach cancer Neg Hx    Colon polyps Neg Hx     Past Surgical History:  Procedure Laterality Date   COLON RESECTION N/A 08/23/2013   Procedure: Laparoscopic total abdominal colectomy and hernia repair;  Surgeon: Leighton Ruff, MD;  [facility];  Service: General;  Laterality: N/A;   COLON SURGERY  08/2013   COLONOSCOPY  11/02/2020   EUS N/A 07/18/2013   Procedure: UPPER ENDOSCOPIC ULTRASOUND (EUS) RADIAL;  Surgeon: Milus Banister, MD;  [facility];  Service: Endoscopy;  Laterality: N/A;   EYE SURGERY     FLEXIBLE  SIGMOIDOSCOPY N/A 2020   prep good   HERNIA REPA[place]  [place]   [place]   INCISIONAL HERNIA REPAIR N/A 02/20/2014   Procedure: LAP ASSISTED INCISIONAL HERNIA REPAIR LYSIS OF ADHESIONS;  Surgeon: Leighton Ruff, MD;  [facility];  Service: General;  Laterality: N/A;  converted to open @ [place] N/A 02/20/2014   Procedure: HERNIA REPAIR INCISIONAL;  Surgeon: Leighton Ruff, MD;  [facility];  Service: General;  Laterality: N/A;  With MESH   INTRAOCULAR LENS INSERTION Bilateral    6 yrs ago   TOTAL KNEE ARTHROPLASTY Right 11/11/2021   Procedure: RIGHT TOTAL KNEE ARTHROPLASTY;  Surgeon: Leandrew Koyanagi, MD;  Location: [place];  Service: Orthopedics;  Laterality: Right;   VENTRAL HERNIA REPAIR     Social History   Occupational History   Occupation: retired  Tobacco Use   Smoking status: Former    Packs/day: 0.00    Types: Cigarettes    Quit date: 06/14/2003    Years since quitting: 18.6   Smokeless tobacco: Never   Tobacco comments:    quit in July 2005  Vaping Use   Vaping Use: Never used  Substance and Sexual Activity   Alcohol use: No   Drug use: Not Currently    Types: 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

## 2022-02-04 NOTE — Telephone Encounter (Signed)
Ortho bundle in person meeting today. 90 day survey completed.

## 2022-02-08 ENCOUNTER — Ambulatory Visit: Payer: Medicare HMO

## 2022-02-08 DIAGNOSIS — M6281 Muscle weakness (generalized): Secondary | ICD-10-CM | POA: Diagnosis not present

## 2022-02-08 DIAGNOSIS — R2689 Other abnormalities of gait and mobility: Secondary | ICD-10-CM

## 2022-02-08 DIAGNOSIS — M25561 Pain in right knee: Secondary | ICD-10-CM | POA: Diagnosis not present

## 2022-02-08 NOTE — Therapy (Signed)
OUTPATIENT PHYSICAL THERAPY TREATMENT NOTE/DISCHARGE  PHYSICAL THERAPY DISCHARGE SUMMARY  Visits from Start of Care: 14  Current functional level related to goals / functional outcomes: See goals and objective   Remaining deficits: See goals and objective   Education / Equipment: HEP   Patient agrees to discharge. Patient goals were met. Patient is being discharged due to meeting the stated rehab goals.   Patient Name: Robert Rhodes MRN: [id number] DOB:12-04-1945, 76 y.o., male Today's Date: 02/08/2022  PCP: [name], MD  REFERRING PROVIDER: Leandrew Koyanagi, MD  END OF SESSION:     PT End of Session - 02/08/22 1356     Visit Number 14    Number of Visits 24    Date for PT Re-Evaluation 02/24/22    Authorization Type [employer] M[employer]    Authorization - Visit Number --    Authorization - Number of Visits --    PT Start Time 1400    PT Stop Time 1440    PT Time Calculation (min) 40 min    Activity Tolerance Patient tolerated treatment well;Patient limited by fatigue    Behavior During Therapy [facility] for tasks assessed/performed                 Past Medical History:  Diagnosis Date   Adenomatous colon polyp    Arthritis    bilateral knees-needs replacements   Claustrophobia    Colon cancer ([place]) 2015   COVID-19    Eye abnormalities    right eye seen in ED 02/17/2014 wearing eye patch using eye drops   GERD (gastroesophageal reflux disease)    with certain foods   Glaucoma    not on eye drops at this time (10/21/2020)   Hypertension    on meds   Irregular heartbeat    per pt/doesn't know what it does   Osteoarthritis    bilat knees  needs replacement   Osteoporosis    pt denies   Substance abuse ([place])    stopped over 3 years ago   UC (ulcerative colitis) ([place])    Vitamin D deficiency    Past Surgical History:  Procedure Laterality Date   COLON RESECTION N/A 08/23/2013   Procedure: Laparoscopic total abdominal colectomy and hernia repair;   Surgeon: Leighton Ruff, MD;  Location: [facility];  Service: General;  Laterality: N/A;   COLON SURGERY  08/2013   COLONOSCOPY  11/02/2020   EUS N/A 07/18/2013   Procedure: UPPER ENDOSCOPIC ULTRASOUND (EUS) RADIAL;  Surgeon: Milus Banister, MD;  Location: [facility];  Service: Endoscopy;  Laterality: N/A;   EYE SURGERY     FLEXIBLE SIGMOIDOSCOPY N/A 2020   prep good   HERNIA REPAIR  2005   In [place]/put in a mesh   INCISIONAL HERNIA REPAIR N/A 02/20/2014   Procedure: LAP ASSISTED INCISIONAL HERNIA REPAIR LYSIS OF ADHESIONS;  Surgeon: Leighton Ruff, MD;  Location: [facility];  Service: General;  Laterality: N/A;  converted to open @ [place] N/A 02/20/2014   Procedure: HERNIA REPAIR INCISIONAL;  Surgeon: Leighton Ruff, MD;  Location: [facility];  Service: General;  Laterality: N/A;  With MESH   INTRAOCULAR LENS INSERTION Bilateral    6 yrs ago   TOTAL KNEE ARTHROPLASTY Right 11/11/2021   Procedure: RIGHT TOTAL KNEE ARTHROPLASTY;  Surgeon: Leandrew Koyanagi, MD;  Location: [place];  Service: Orthopedics;  Laterality: Right;   VENTRAL HERNIA REPAIR     Patient Active Problem List   Diagnosis  Date Noted   Primary osteoarthritis of right knee 11/11/2021   Status post total right knee replacement 11/11/2021   AKI (acute kidney injury) ([place]) 04/21/2019   Pneumonia due to COVID-19 virus 04/21/2019   Osteoporosis    Ulcerative colitis, with rectal bleeding 08/06/2015   Gastroesophageal reflux disease without esophagitis 08/06/2015   Visual impairment of right eye 06/18/2015   Essential hypertension, benign 06/18/2015   Hernia of abdominal cavity 11/27/2014   Bipolar 1 disorder, mixed, moderate (HCC) 11/27/2014   Cellulitis 08/23/2014   Medically noncompliant 08/23/2014   Left leg cellulitis 08/23/2014   Cellulitis of left leg    Hypokalemia    Diarrhea 05/06/2014   Ulcerative colitis ([place]) 04/17/2014   Verrucous keratosis 04/17/2014   Nonspecific (abnormal) findings on radiological and  other examination of gastrointestinal tract 07/18/2013   Acute esophagitis 07/18/2013   Hypovitaminosis D 05/06/2013   Ulcerative colitis, unspecified 04/04/2013   Knee pain, bilateral 03/05/2013    REFERRING DIAG: Z96.651 (ICD-10-CM) - Status post total right knee replacement M17.11 (ICD-10-CM) - Primary osteoarthritis of right knee  THERAPY DIAG:  Acute pain of right knee  Muscle weakness (generalized)  Other abnormalities of gait and mobility  Rationale for Evaluation and Treatment Rehabilitation  PERTINENT HISTORY: Osteoporosis; bipolar disorder   PRECAUTIONS: None  ONSET DATE: 11/11/2021  SUBJECTIVE:  Pt presents to PT with no current pain. Had a good follow up visit with MD last week, will soon schedule a L TKA in the next few months. Pt is ready to begin PT at this time.  PAIN:  Are you having pain?  Yes: NPRS scale: 1/10 (5/10 at worst, describes more stiffness than pain) Pain location: R knee Pain description: stiff, sharp Aggravating factors: prolonged position Relieving factors: movement, ice  OBJECTIVE: (objective measures completed at initial evaluation unless otherwise dated)  PATIENT SURVEYS:  FOTO: 66% function   COGNITION:           Overall cognitive status: Within functional limits for tasks assessed                          SENSATION: Light touch: decreased light touch in lateral R tibaia   POSTURE:  rounded shoulders and forward head   PALPATION: Slight TTP to distal R quad   LOWER EXTREMITY ROM:      AROM Right 12/22/2021 Right 01/12/2022 Right 01/19/2022 Right 01/26/22 Right 02/01/22 Right 02/08/2022  Knee flexion AROM 108 105 AROM 106 AROM 108 AROM 110 AROM 113  Knee extension _0 (Blank rows = not tested)     LOWER EXTREMITY MMT:     MMT Right 12/02/2021  Left 12/02/2021   Hip flexion       Hip extension      Hip abduction      Hip adduction      Hip external rotation      Hip internal rotation       Knee extension DNT    Knee flexion DNT    Ankle dorsiflexion       Ankle plantarflexion      Ankle inversion      Ankle eversion      Grossly      (Blank rows = not tested)   FUNCTIONAL TESTS:  30 Second Sit to Stand: 18 reps -  no UE support   GAIT: Distance walked: 48f Assistive device utilized: WEnvironmental consultant- 2 wheeled Level of assistance: Complete Independence Comments: decreased gait speed, antalgic gait, decrease R knee ext   TODAY'S TREATMENT: [facility] Adult PT Treatment:                                                DATE: 02/08/2022 Therapeutic Exercise: Bike L2 x 5 mins full revolutions Slant board gastroc stretch x 60" Knee flexion stretch on 12" stacked boxes 2x30" Seated knee ext 2x10 5# R only Seated knee flexion 2x10 20# Sit to stand 2x10 on airex no UE support Step up 8" step R LE lead  x 20 Lateral step up 6" step R LE lead  2 x 10  Seated hamstring stretch 2x30" Rt Supine SLR 2x15 5# R Therapeutic Activity: Assessment of tests/measures, goals, and outcomes for discharge  [facility]                                                [facility]: [facility] Therapeutic Exercise: Bike L2 x 5 mins full revolutions Slant board gastroc stretch x 60" Knee flexion stretch on 12" stacked boxes 2x30" Seated knee ext 2x10 5# R only Seated knee flexion 2x10 15# Sit to stand 2x10 on airex no UE support Step up 8" step R LE lead  x 20 Lateral step up 6" step R LE lead  2 x 10  Seated hamstring stretch 2x30" Rt Supine SLR 2x15 5# R Manual Therapy: Short sitting with OP into flexion x 10 - 10" hold  [facility] Adult PT Treatment:                                                DATE: 01/26/2022 Therapeutic Exercise: Bike x5 mins full revolutions Slant board gastroc stretch 2x1" Knee flexion stretch on 12" stacked boxes x20 Sit to stand 10 x on airex no UE support Step up 6" step R LE lead  2 x 10  Lateral step up 6" step R LE lead  2 x 10  Seated hamstring stretch 2x30"  Rt Heel taps Rt on 2" step 3x10 Heel slides 5" hold x10 Prone quad stretch x1' Hamstring curls BlueTB 2x10 Rt Manual Therapy: Short sitting with OP into flexion x 10 - 10" hold   PATIENT EDUCATION:  Education details: continue HEP Person educated: Patient Education method: Explanation, Demonstration, and Handouts Education comprehension: verbalized understanding and returned demonstration     HOME EXERCISE PROGRAM: Access Code: [email or web address] URL: [email or web address] Date: 02/08/2022 Prepared by: DOctavio Manns Exercises - Seated Hamstring Stretch  - 1 x daily - 7 x weekly - 2-3 reps - 30 sec hold - Seated Heel Slide  - 1 x daily - 7 x weekly - 2-3 sets - 10 reps - 5 sec hold - Sit to Stand Without Arm Support  - 1 x daily - 7 x weekly - 3 sets - 10-15 reps - Long Sitting Quad Set with Towel Roll Under Heel  - 1 x daily - 7 x weekly - 2-3 set1s -  10 reps - 5 sec hold - Active Straight Leg Raise with Quad Set  - 1 x daily - 7 x weekly - 2-3 sets - 20 reps - Supine Heel Slide with Strap  - 1 x daily - 7 x weekly - 2-3 sets - 10 reps - 5 sec hold - Single Leg Knee Extension with Weight Machine  - 1 x daily - 7 x weekly - 2-3 sets - 10 reps - Hamstring Curl with Weight Machine  - 1 x daily - 7 x weekly - 2-3 sets - 10 reps - Standing Knee Flexion Stretch on Step  - 1 x daily - 7 x weekly - 2-3 reps - 30 sec hold - Seated Knee Extension Stretch with Chair  - 1 x daily - 7 x weekly - 1 reps - 5 min hold   ASSESSMENT:   CLINICAL IMPRESSION: Pt was once again able to complete all prescribed exercises and demonstrated knowledge of HEP with no adverse effect. Over the course of PT treatment he has progressed very well, demonstrating improved strength, R knee ROM, and functional mobility. He has met all LTGs with therapy and should continue to improve with HEP compliance post surgery. Pt is in agreement with current plan and is ready to discharge from skilled PT at this time.    OBJECTIVE IMPAIRMENTS Abnormal gait, decreased balance, decreased endurance, decreased mobility, difficulty walking, decreased ROM, decreased strength, and pain.    ACTIVITY LIMITATIONS carrying, lifting, squatting, stairs, and transfers   PARTICIPATION LIMITATIONS: meal prep, cleaning, and community activity   PERSONAL FACTORS 1-2 comorbidities: Osteoporosis; bipolar disorder   are also affecting patient's functional outcome.      GOALS: Goals reviewed with patient? No   SHORT TERM GOALS: Target date: 12/23/2021  Pt will be compliant and knowledgeable with initial HEP for improved comfort and carryover Baseline: initial HEP given Goal status: MET 12/27/2021   2.  Pt will self report right knee pain no greater than 6/10 for improved comfort and functional ability Baseline: 8/10 at worst Goal status: MET 12/27/2021   LONG TERM GOALS: Target date: 02/24/2022    Pt will self report right knee pain no greater than 1-2/10 for improved comfort and functional ability Baseline: 8/10 at worst Goal status: MET   2.  Pt will improve FOTO function score to no less than 64% as proxy for functional improvement Baseline: 47% function 02/08/2022: 66% function Goal status: MET   3.  Pt will improve R knee AROM to range on 0-12 degrees for improved functional mobility and community navigation Baseline: see chart Goal status: In progress 3-108   4.  Pt will increase 30 Second Sit to Stand rep count to no less than 15 reps with no UE supportfor improved balance, strength, and functional mobility Baseline: 14 reps with bilateral UE support  02/08/2022: 18 reps - no UE support Goal status: MET   5.  Pt will be able to ambulate 1035f with LRAD for improved functional mobility and safety with community activites Baseline: unable Goal status: MET   PLAN: PT FREQUENCY: 2x/week   PT DURATION: 12 weeks   PLANNED INTERVENTIONS: Therapeutic exercises, Therapeutic activity, Neuromuscular  re-education, Balance training, Gait training, Patient/Family education, Joint mobilization, Aquatic Therapy, Dry Needling, Electrical stimulation, Cryotherapy, Moist heat, Taping, Vasopneumatic device, Manual therapy, and Re-evaluation   PLAN FOR NEXT SESSION: assess HEP response, progress LE strength and R knee ROM, Step up's.     DWard ChattersPT  02/08/22 2:47  PM

## 2022-02-11 ENCOUNTER — Ambulatory Visit: Payer: Medicare HMO | Attending: Family Medicine | Admitting: Pharmacist

## 2022-02-11 VITALS — BP 126/80 | HR 81

## 2022-02-11 DIAGNOSIS — I10 Essential (primary) hypertension: Secondary | ICD-10-CM

## 2022-02-11 NOTE — Progress Notes (Signed)
   S:     No chief complaint on file.  Robert Rhodes is a 76 y.o. male who presents for hypertension evaluation, education, and management. 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 Patient was referred and last seen by Primary Care Provider, Dr. Margarita Rana, on 11/30/21. We saw him 01/07/2022 and his BP was good.   Today, patient arrives in spirits and ambulates with a cane. Denies dizziness, headache, blurred vision, swelling.   Medication adherence reported, takes amlodipine around 12PM every day . Patient has NOT taken BP medications today.   Current antihypertensives include: amlodipine 5 mg  Reported home BP readings: none given   Patient-reported exercise habits: limited due to age and R TKA  O:  Vitals:   02/11/22 1132  BP: 126/80  Pulse: 81   Last 3 Office BP readings: BP Readings from Last 3 Encounters:  02/11/22 126/80  01/07/22 132/72  11/30/21 (!) 153/74    BMET    Component Value Date/Time   NA 132 (L) 11/30/2021 1112   K 3.8 11/30/2021 1112   CL 94 (L) 11/30/2021 1112   CO2 23 11/30/2021 1112   GLUCOSE 81 11/30/2021 1112   GLUCOSE 92 11/23/2021 1438   BUN 11 11/30/2021 1112   CREATININE 0.96 11/30/2021 1112   CREATININE 0.82 11/27/2014 1558   CALCIUM 9.7 11/30/2021 1112   GFRNONAA >60 11/23/2021 1438   GFRAA 79 10/22/2019 1043    Renal function: CrCl cannot be calculated (Patient's most recent lab result is older than the maximum 21 days allowed.).  Clinical ASCVD: No  The ASCVD Risk score (Arnett DK, et al., 2019) failed to calculate for the following reasons:   The valid total cholesterol range is 130 to 320 mg/dL  A/P: Hypertension longstanding, currently close to goal on current medications. BP goal < 130/80 mmHg. Medication adherence appears optimal. -Continued amlodipine 5 mg daily as patient's BP close to goal without taking BP medications today.  -Educated patient  to take BP medications prior to visits. -Encouraged patient to check BP at home and bring log of readings to next visit. Counseled on proper use of home BP cuff.    Results reviewed and written information provided.    Written patient instructions provided. Patient verbalized understanding of treatment plan.  Total time in face to face counseling 30 minutes.    Follow-up:  PCP clinic visit in 03/21/22.   Benard Halsted, PharmD, [facility] [place], [place] [phone]

## 2022-02-15 ENCOUNTER — Ambulatory Visit: Payer: Medicare HMO

## 2022-03-21 ENCOUNTER — Encounter: Payer: Self-pay | Admitting: Family Medicine

## 2022-03-21 ENCOUNTER — Ambulatory Visit: Payer: Medicare HMO | Attending: Family Medicine | Admitting: Family Medicine

## 2022-03-21 VITALS — BP 119/70 | HR 93 | Temp 98.3°F | Ht 69.0 in | Wt 226.6 lb

## 2022-03-21 DIAGNOSIS — I10 Essential (primary) hypertension: Secondary | ICD-10-CM

## 2022-03-21 DIAGNOSIS — Z9189 Other specified personal risk factors, not elsewhere classified: Secondary | ICD-10-CM | POA: Diagnosis not present

## 2022-03-21 DIAGNOSIS — K5190 Ulcerative colitis, unspecified, without complications: Secondary | ICD-10-CM

## 2022-03-21 MED ORDER — AMLODIPINE BESYLATE 5 MG PO TABS: ORAL_TABLET | ORAL | 1 refills | Status: DC

## 2022-03-21 MED ORDER — ATORVASTATIN CALCIUM 20 MG PO TABS
20.0000 mg | ORAL_TABLET | Freq: Every day | ORAL | 1 refills | Status: DC
Start: 2022-03-21 — End: 2022-09-20

## 2022-03-21 NOTE — Patient Instructions (Signed)
Exercising to Stay Healthy To become healthy and stay healthy, it is recommended that you do moderate-intensity and vigorous-intensity exercise. You can tell that you are exercising at a moderate intensity if your heart starts beating faster and you start breathing faster but can still hold a conversation. You can tell that you are exercising at a vigorous intensity if you are breathing much harder and faster and cannot hold a conversation while exercising. How can exercise benefit me? Exercising regularly is important. It has many health benefits, such as: Improving overall fitness, flexibility, and endurance. Increasing bone density. Helping with weight control. Decreasing body fat. Increasing muscle strength and endurance. Reducing stress and tension, anxiety, depression, or anger. Improving overall health. What guidelines should I follow while exercising? Before you start a new exercise program, talk with your health care provider. Do not exercise so much that you hurt yourself, feel dizzy, or get very short of breath. Wear comfortable clothes and wear shoes with good support. Drink plenty of water while you exercise to prevent dehydration or heat stroke. Work out until your breathing and your heartbeat get faster (moderate intensity). How often should I exercise? Choose an activity that you enjoy, and set realistic goals. Your health care provider can help you make an activity plan that is individually designed and works best for you. Exercise regularly as told by your health care provider. This may include: Doing strength training two times a week, such as: Lifting weights. Using resistance bands. Push-ups. Sit-ups. Yoga. Doing a certain intensity of exercise for a given amount of time. Choose from these options: A total of 150 minutes of moderate-intensity exercise every week. A total of 75 minutes of vigorous-intensity exercise every week. A mix of moderate-intensity and  vigorous-intensity exercise every week. Children, pregnant women, people who have not exercised regularly, people who are overweight, and older adults may need to talk with a health care provider about what activities are safe to perform. If you have a medical condition, be sure to talk with your health care provider before you start a new exercise program. What are some exercise ideas? Moderate-intensity exercise ideas include: Walking 1 mile (1.6 km) in about 15 minutes. Biking. Hiking. Golfing. Dancing. Water aerobics. Vigorous-intensity exercise ideas include: Walking 4.5 miles (7.2 km) or more in about 1 hour. Jogging or running 5 miles (8 km) in about 1 hour. Biking 10 miles (16.1 km) or more in about 1 hour. Lap swimming. Roller-skating or in-line skating. Cross-country skiing. Vigorous competitive sports, such as football, basketball, and soccer. Jumping rope. Aerobic dancing. What are some everyday activities that can help me get exercise? Yard work, such as: Pushing a lawn mower. Raking and bagging leaves. Washing your car. Pushing a stroller. Shoveling snow. Gardening. Washing windows or floors. How can I be more active in my day-to-day activities? Use stairs instead of an elevator. Take a walk during your lunch break. If you drive, park your car farther away from your work or school. If you take public transportation, get off one stop early and walk the rest of the way. Stand up or walk around during all of your indoor phone calls. Get up, stretch, and walk around every 30 minutes throughout the day. Enjoy exercise with a friend. Support to continue exercising will help you keep a regular routine of activity. Where to find more information You can find more information about exercising to stay healthy from: U.S. Department of Health and Human Services: www.hhs.gov Centers for Disease Control and Prevention (  CDC): www.cdc.gov Summary Exercising regularly is  important. It will improve your overall fitness, flexibility, and endurance. Regular exercise will also improve your overall health. It can help you control your weight, reduce stress, and improve your bone density. Do not exercise so much that you hurt yourself, feel dizzy, or get very short of breath. Before you start a new exercise program, talk with your health care provider. This information is not intended to replace advice given to you by your health care provider. Make sure you discuss any questions you have with your health care provider. Document Revised: 09/25/2020 Document Reviewed: 09/25/2020 Elsevier Patient Education  2023 [employer]  

## 2022-03-21 NOTE — Progress Notes (Signed)
Subjective:  Patient ID: Robert Rhodes, male    DOB: 1946/05/21  Age: 76 y.o. MRN: [id number]  CC: Hypertension (HTN f/u. No questions/ concerns. [facility] med refill.)   HPI Robert Rhodes is a 76 y.o. year old male with a history of hypertension, schizophrenia (diagnosed in the 67s, stable of medications), ulcerative colitis, osteoarthritis of the knees (status post right TKA) here for chronic disease management.    Interval History:  His blood pressure is good and he endorses adherence with his antihypertensive.  He exercises by means of walking. Also endorses adherence with his statin with no adverse effects from his medication. He has not had any ulcerative colitis flare. His right knee is doing well and he cannot wait to have surgery on his left knee.  Ambulates with a cane. Past Medical History:  Diagnosis Date   Adenomatous colon polyp    Arthritis    bilateral knees-needs replacements   Claustrophobia    Colon cancer ([place]) 2015   COVID-19    Eye abnormalities    right eye seen in ED 02/17/2014 wearing eye patch using eye drops   GERD (gastroesophageal reflux disease)    with certain foods   Glaucoma    not on eye drops at this time (10/21/2020)   Hypertension    on meds   Irregular heartbeat    per pt/doesn't know what it does   Osteoarthritis    bilat knees  needs replacement   Osteoporosis    pt denies   Substance abuse ([place])    stopped over 3 years ago   UC (ulcerative colitis) ([place])    Vitamin D deficiency     Past Surgical History:  Procedure Laterality Date   COLON RESECTION N/A 08/23/2013   Procedure: Laparoscopic total abdominal colectomy and hernia repair;  Surgeon: Leighton Ruff, MD;  [facility];  Service: General;  Laterality: N/A;   COLON SURGERY  08/2013   COLONOSCOPY  11/02/2020   EUS N/A 07/18/2013   Procedure: UPPER ENDOSCOPIC ULTRASOUND (EUS) RADIAL;  Surgeon: Milus Banister, MD;  [facility];  Service: Endoscopy;   Laterality: N/A;   EYE SURGERY     FLEXIBLE SIGMOIDOSCOPY N/A 2020   prep good   HERNIA REPAIR  2005   In [place]   INCISIONAL HERNIA REPAIR N/A 02/20/2014   Procedure: LAP ASSISTED INCISIONAL HERNIA REPAIR LYSIS OF ADHESIONS;  Surgeon: Leighton Ruff, MD;  [facility];  Service: General;  Laterality: N/A;  converted to open @ [place] N/A 02/20/2014   Procedure: HERNIA REPAIR INCISIONAL;  Surgeon: Leighton Ruff, MD;  [facility];  Service: General;  Laterality: N/A;  With MESH   INTRAOCULAR LENS INSERTION Bilateral    6 yrs ago   TOTAL KNEE ARTHROPLASTY Right 11/11/2021   Procedure: RIGHT TOTAL KNEE ARTHROPLASTY;  Surgeon: Leandrew Koyanagi, MD;  Location: [facility];  Service: Orthopedics;  Laterality: Right;   VENTRAL HERNIA REPAIR      Family History  Problem Relation Age of Onset   Cancer Mother        type unknown-in her leg   Diabetes Father    Heart disease Father    Heart disease Sister    Heart attack Brother    Healthy Sister    Colon cancer Neg Hx    Esophageal cancer Neg Hx    Rectal cancer Neg Hx    Stomach cancer Neg Hx  Colon polyps Neg Hx     Social History   Socioeconomic History   Marital status: Single    Spouse name: Not on file   Number of children: 0   Years of education: Not on file   Highest education level: Not on file  Occupational History   Occupation: retired  Tobacco Use   Smoking status: Former    Packs/day: 0.00    Types: Cigarettes    Quit date: 06/14/2003    Years since quitting: 18.7   Smokeless tobacco: Never   Tobacco comments:    quit in July 2005  Vaping Use   Vaping Use: Never used  Substance and Sexual Activity   Alcohol use: No   Drug use: Not Currently    Types: 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  Other Topics Concern   Not on file  Social History Narrative   Not on file   Social Determinants of Health   Financial Resource Strain: Not on file   Food Insecurity: Not on file  Transportation Needs: Not on file  Physical Activity: Not on file  Stress: Not on file  Social Connections: Not on file    Allergies  Allergen Reactions   Ace Inhibitors Other (See Comments)    Unknown reaction- "affecting my breathing in some kind of way"   Advil [Ibuprofen] Other (See Comments)    Muscle tightness   Aspirin Nausea And Vomiting   Tylenol [Acetaminophen] Other (See Comments)    Muscle tightness    Outpatient Medications Prior to Visit  Medication Sig Dispense Refill   FIBER ADULT GUMMIES PO Take 2 tablets by mouth daily.     magnesium oxide (MAG-OX) 400 MG tablet Take 400 mg by mouth daily.     methocarbamol (ROBAXIN-750) 750 MG tablet Take 1 tablet (750 mg total) by mouth 2 (two) times daily as needed for muscle spasms. 20 tablet 2   omeprazole (PRILOSEC OTC) 20 MG tablet Take 20 mg by mouth daily as needed (acid reflux).     amLODipine (NORVASC) 5 MG tablet TAKE 1 TABLET BY MOUTH ONCE DAILY- HYPERTENSION 90 tablet 1   atorvastatin (LIPITOR) 20 MG tablet Take 1 tablet (20 mg total) by mouth daily. 90 tablet 1   oxyCODONE (ROXICODONE) 5 MG immediate release tablet Take 1 tab po every 8 hours prn pain. (Patient not taking: Reported on 03/21/2022) 40 tablet 0   docusate sodium (COLACE) 100 MG capsule Take 1 capsule (100 mg total) by mouth daily as needed. (Patient not taking: Reported on 03/21/2022) 30 capsule 2   ondansetron (ZOFRAN) 4 MG tablet Take 1 tablet (4 mg total) by mouth every 8 (eight) hours as needed for nausea or vomiting. (Patient not taking: Reported on 03/21/2022) 40 tablet 0   Potassium 99 MG TABS Take 99 mg by mouth daily. (Patient not taking: Reported on 03/21/2022)     rivaroxaban (XARELTO) 10 MG TABS tablet Take 1 tablet (10 mg total) by mouth daily. To be taken after surgery to prevent blood clots (Patient not taking: Reported on 03/21/2022) 35 tablet 0   sulfamethoxazole-trimethoprim (BACTRIM DS) 800-160 MG tablet Take 1  tablet by mouth 2 (two) times daily. (Patient not taking: Reported on 03/21/2022) 20 tablet 0   No facility-administered medications prior to visit.     ROS Review of Systems  Constitutional:  Negative for activity change and appetite change.  HENT:  Negative for sinus pressure and sore throat.  Respiratory:  Negative for chest tightness, shortness of breath and wheezing.   Cardiovascular:  Negative for chest pain and palpitations.  Gastrointestinal:  Negative for abdominal distention, abdominal pain and constipation.  Genitourinary: Negative.   Musculoskeletal:        Left knee pain  Psychiatric/Behavioral:  Negative for behavioral problems and dysphoric mood.     Objective:  BP 119/70 (BP Location: Right Arm, Patient Position: Sitting, Cuff Size: Large)   Pulse 93   Temp 98.3 F (36.8 C) (Oral)   Ht 5' 9"  (1.753 m)   Wt 226 lb 9.6 oz (102.8 kg)   SpO2 99%   BMI 33.46 kg/m      03/21/2022    8:37 AM 02/11/2022   11:32 AM 01/07/2022   11:14 AM  BP/Weight  Systolic BP 244 010 272  Diastolic BP 70 80 72  Wt. (Lbs) 226.6    BMI 33.46 kg/m2        Physical Exam Constitutional:      Appearance: He is well-developed.  Cardiovascular:     Rate and Rhythm: Normal rate.     Heart sounds: Normal heart sounds. No murmur heard. Pulmonary:     Effort: Pulmonary effort is normal.     Breath sounds: Normal breath sounds. No wheezing or rales.  Chest:     Chest wall: No tenderness.  Abdominal:     General: Bowel sounds are normal. There is no distension.     Palpations: Abdomen is soft. There is no mass.     Tenderness: There is no abdominal tenderness.     Hernia: A hernia (ventral) is present.  Musculoskeletal:        General: Normal range of motion.     Right lower leg: No edema.     Left lower leg: No edema.  Neurological:     Mental Status: He is alert and oriented to person, place, and time.  Psychiatric:        Mood and Affect: Mood normal.        Latest Ref  Rng & Units 11/30/2021   11:12 AM 11/23/2021    2:38 PM 11/17/2021   11:15 AM  CMP  Glucose 70 - 99 mg/dL 81  92  106   BUN 8 - 27 mg/dL 11  9  11    Creatinine 0.76 - 1.27 mg/dL 0.96  1.21  1.28   Sodium 134 - 144 mmol/L 132  129  130   Potassium 3.5 - 5.2 mmol/L 3.8  4.5  3.6   Chloride 96 - 106 mmol/L 94  95  96   CO2 20 - 29 mmol/L 23  24  27    Calcium 8.6 - 10.2 mg/dL 9.7  9.2  9.3   Total Protein 6.5 - 8.1 g/dL   8.3   Total Bilirubin 0.3 - 1.2 mg/dL   1.3   Alkaline Phos 38 - 126 U/L   73   AST 15 - 41 U/L   76   ALT 0 - 44 U/L   83     Lipid Panel     Component Value Date/Time   CHOL 128 10/11/2021 1041   TRIG 81 10/11/2021 1041   HDL 44 10/11/2021 1041   CHOLHDL 2.9 10/11/2021 1041   CHOLHDL 4.2 03/05/2013 1021   VLDL 28 03/05/2013 1021   LDLCALC 68 10/11/2021 1041    CBC    Component Value Date/Time   WBC 11.7 (H) 11/23/2021 1438   RBC 4.17 (L)  11/23/2021 1438   HGB 11.7 (L) 11/23/2021 1438   HGB 13.6 10/22/2019 1043   HCT 35.3 (L) 11/23/2021 1438   HCT 41.4 10/22/2019 1043   PLT 637 (H) 11/23/2021 1438   PLT 429 10/22/2019 1043   MCV 84.7 11/23/2021 1438   MCV 86 10/22/2019 1043   MCH 28.1 11/23/2021 1438   MCHC 33.1 11/23/2021 1438   RDW 14.0 11/23/2021 1438   RDW 14.4 10/22/2019 1043   LYMPHSABS 2.6 11/17/2021 1115   LYMPHSABS 2.6 10/22/2019 1043   MONOABS 1.4 (H) 11/17/2021 1115   EOSABS 0.3 11/17/2021 1115   EOSABS 0.2 10/22/2019 1043   BASOSABS 0.0 11/17/2021 1115   BASOSABS 0.1 10/22/2019 1043    Lab Results  Component Value Date   HGBA1C 5.5 10/22/2019    Assessment & Plan:  1. Essential hypertension, benign Controlled I have demonstrated the use of his home blood pressure monitor for him and discovered he needs to place a new battery Counseled on blood pressure goal of less than 130/80, low-sodium, DASH diet, medication compliance, 150 minutes of moderate intensity exercise per week. Discussed medication compliance, adverse effects. -  amLODipine (NORVASC) 5 MG tablet; TAKE 1 TABLET BY MOUTH ONCE DAILY- HYPERTENSION  Dispense: 90 tablet; Refill: 1  2. At increased risk for cardiovascular disease Normal lipid panel Low-cholesterol diet - atorvastatin (LIPITOR) 20 MG tablet; Take 1 tablet (20 mg total) by mouth daily.  Dispense: 90 tablet; Refill: 1  3. Ulcerative colitis without complications, unspecified location ([place]) Stable with no flares   Health care maintenance-he received his RSV vaccine and flu shot 2 weeks ago at [facility].  Meds ordered this encounter  Medications   amLODipine (NORVASC) 5 MG tablet    Sig: TAKE 1 TABLET BY MOUTH ONCE DAILY- HYPERTENSION    Dispense:  90 tablet    Refill:  1   atorvastatin (LIPITOR) 20 MG tablet    Sig: Take 1 tablet (20 mg total) by mouth daily.    Dispense:  90 tablet    Refill:  1    Follow-up: Return in about 6 months (around 09/20/2022) for Chronic medical conditions.       Robert Rakes, MD, FAAFP. [facility] and [place], [place]   03/21/2022, 9:01 AM

## 2022-04-04 ENCOUNTER — Ambulatory Visit: Payer: Medicare HMO | Attending: Cardiology | Admitting: Cardiology

## 2022-04-04 ENCOUNTER — Encounter: Payer: Self-pay | Admitting: Cardiology

## 2022-04-04 VITALS — BP 132/80 | HR 93 | Ht 69.0 in | Wt 229.0 lb

## 2022-04-04 DIAGNOSIS — I10 Essential (primary) hypertension: Secondary | ICD-10-CM

## 2022-04-04 DIAGNOSIS — E785 Hyperlipidemia, unspecified: Secondary | ICD-10-CM | POA: Diagnosis not present

## 2022-04-04 DIAGNOSIS — R9431 Abnormal electrocardiogram [ECG] [EKG]: Secondary | ICD-10-CM | POA: Diagnosis not present

## 2022-04-04 NOTE — Progress Notes (Signed)
Cardiology Office Note:    Date:  04/04/2022   ID:  Robert Rhodes, DOB 10-22-1945, MRN [id number]  PCP:  [name], MD  Cardiologist:  None  Electrophysiologist:  None   Referring MD: [name], MD   Chief Complaint  Patient presents with   Follow-up   Abnormal ECG    History of Present Illness:    Robert Rhodes is a 76 y.o. male with a hx of colon cancer, GERD, hypertension, former substance abuse, ulcerative colitis who presents for follow-up.  He was referred by Dr. Margarita Rana for preoperative evaluation, initially seen on 10/11/2021.  Planning knee surgery.  He denies any chest pain or dyspnea.  Reports he walks 25 minutes/day, denies any exertional symptoms.  States that he can walk up a flight of stairs without stopping, is limited by knee pain sometimes but denies any exertional chest pain or dyspnea.  He denies any lightheadedness, syncope, lower extremity edema, or palpitations.  He smoked off and on for 40 years, quit in 2005.  Quit smoking marijuana in 2016.  Family history includes sister has pacemaker.  Echocardiogram 10/14/2021 showed normal biventricular function, no significant valvular disease.  Since last clinic visit, he reports that he is doing well.  Underwent knee surgery, no complications.  Denies any chest pain, dyspnea, lightheadedness, syncope, lower extremity edema, or palpitations.  Walks 4 days per week for 25 minutes.  No exertional symptoms.    Past Medical History:  Diagnosis Date   Adenomatous colon polyp    Arthritis    bilateral knees-needs replacements   Claustrophobia    Colon cancer ([place]) 2015   COVID-19    Eye abnormalities    right eye seen in ED 02/17/2014 wearing eye patch using eye drops   GERD (gastroesophageal reflux disease)    with certain foods   Glaucoma    not on eye drops at this time (10/21/2020)   Hypertension    on meds   Irregular heartbeat    per pt/doesn't know what it does   Osteoarthritis    bilat knees   needs replacement   Osteoporosis    pt denies   Substance abuse ([place])    stopped over 3 years ago   UC (ulcerative colitis) ([place])    Vitamin D deficiency     Past Surgical History:  Procedure Laterality Date   COLON RESECTION N/A 08/23/2013   Procedure: Laparoscopic total abdominal colectomy and hernia repair;  Surgeon: Leighton Ruff, MD;  [facility];  Service: General;  Laterality: N/A;   COLON SURGERY  08/2013   COLONOSCOPY  11/02/2020   EUS N/A 07/18/2013   Procedure: UPPER ENDOSCOPIC ULTRASOUND (EUS) RADIAL;  Surgeon: Milus Banister, MD;  [facility];  Service: Endoscopy;  Laterality: N/A;   EYE SURGERY     FLEXIBLE SIGMOIDOSCOPY N/A 2020   prep good   HERNIA REPA[place]  2005   [place]   INCISIONAL HERNIA REPAIR N/A 02/20/2014   Procedure: LAP ASSISTED INCISIONAL HERNIA REPAIR LYSIS OF ADHESIONS;  Surgeon: Leighton Ruff, MD;  [facility];  Service: General;  Laterality: N/A;  converted to open @ [place] N/A 02/20/2014   Procedure: HERNIA REPAIR INCISIONAL;  Surgeon: Leighton Ruff, MD;  [facility];  Service: General;  Laterality: N/A;  With MESH   INTRAOCULAR LENS INSERTION Bilateral    6 yrs ago   TOTAL KNEE ARTHROPLASTY Right 11/11/2021   Procedure: RIGHT TOTAL KNEE  ARTHROPLASTY;  Surgeon: Leandrew Koyanagi, MD;  Location: [facility];  Service: Orthopedics;  Laterality: Right;   VENTRAL HERNIA REPAIR      Current Medications: Current Meds  Medication Sig   amLODipine (NORVASC) 5 MG tablet TAKE 1 TABLET BY MOUTH ONCE DAILY- HYPERTENSION   atorvastatin (LIPITOR) 20 MG tablet Take 1 tablet (20 mg total) by mouth daily.   FIBER ADULT GUMMIES PO Take 2 tablets by mouth daily.   magnesium oxide (MAG-OX) 400 MG tablet Take 400 mg by mouth daily.   omeprazole (PRILOSEC OTC) 20 MG tablet Take 20 mg by mouth daily as needed (acid reflux).     Allergies:   Ace inhibitors, Advil [ibuprofen], Aspirin, and Tylenol [acetaminophen]    Social History   Socioeconomic History   Marital status: Single    Spouse name: Not on file   Number of children: 0   Years of education: Not on file   Highest education level: Not on file  Occupational History   Occupation: retired  Tobacco Use   Smoking status: Former    Packs/day: 0.00    Types: Cigarettes    Quit date: 06/14/2003    Years since quitting: 18.8   Smokeless tobacco: Never   Tobacco comments:    quit in July 2005  Vaping Use   Vaping Use: Never used  Substance and Sexual Activity   Alcohol use: No   Drug use: Not Currently    Types: 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  Other Topics Concern   Not on file  Social History Narrative   Not on file   Social Determinants of Health   Financial Resource Strain: Not on file  Food Insecurity: Not on file  Transportation Needs: Not on file  Physical Activity: Not on file  Stress: Not on file  Social Connections: Not on file     Family History: The patient's family history includes Cancer in his mother; Diabetes in his father; Healthy in his sister; Heart attack in his brother; Heart disease in his father and sister. There is no history of Colon cancer, Esophageal cancer, Rectal cancer, Stomach cancer, or Colon polyps.  ROS:   Please see the history of present illness.     All other systems reviewed and are negative.  EKGs/Labs/Other Studies Reviewed:    The following studies were reviewed today:   EKG:   10/11/2021: Sinus tachycardia, rate 108, nonspecific T wave flattening  Recent Labs: 11/17/2021: ALT 83 11/23/2021: Hemoglobin 11.7; Platelets 637 11/30/2021: BUN 11; Creatinine, Ser 0.96; Magnesium 2.0; Potassium 3.8; Sodium 132  Recent Lipid Panel    Component Value Date/Time   CHOL 128 10/11/2021 1041   TRIG 81 10/11/2021 1041   HDL 44 10/11/2021 1041   CHOLHDL 2.9 10/11/2021 1041   CHOLHDL 4.2 03/05/2013 1021   VLDL 28 03/05/2013 1021   LDLCALC 68  10/11/2021 1041    Physical Exam:    VS:  BP 132/80 (BP Location: Left Arm, Patient Position: Sitting, Cuff Size: Large)   Pulse 93   Ht 5' 9"  (1.753 m)   Wt 229 lb (103.9 kg)   SpO2 96%   BMI 33.82 kg/m     Wt Readings from Last 3 Encounters:  04/04/22 229 lb (103.9 kg)  03/21/22 226 lb 9.6 oz (102.8 kg)  11/30/21 208 lb 6.4 oz (94.5 kg)     GEN:  Well nourished, well developed in no acute distress HEENT: Normal  NECK: No JVD; No carotid bruits LYMPHATICS: No lymphadenopathy CARDIAC: RRR, no murmurs, rubs, gallops RESPIRATORY:  Clear to auscultation without rales, wheezing or rhonchi  ABDOMEN: Soft, non-tender, non-distended MUSCULOSKELETAL:  No edema; No deformity  SKIN: Warm and dry NEUROLOGIC:  Alert and oriented x 3 PSYCHIATRIC:  Normal affect   ASSESSMENT:    1. Abnormal EKG   2. Essential hypertension   3. Hyperlipidemia, unspecified hyperlipidemia type     PLAN:    Abnormal EKG: subtle EKG abnormalities with nonspecific ST/T wave abnormalities and sinus tachycardia.  Echocardiogram 10/14/2021 showed normal biventricular function, no significant valvular disease.  Hypertension: On amlodipine 5 mg daily.  Appears controlled  Hyperlipidemia: LDL 124 in 11/17/2020.  Started on atorvastatin 20 mg daily at that time.  LDL 68 on 10/11/2021  RTC in 1 year   Medication Adjustments/Labs and Tests Ordered: Current medicines are reviewed at length with the patient today.  Concerns regarding medicines are outlined above.  No orders of the defined types were placed in this encounter.  No orders of the defined types were placed in this encounter.   Patient Instructions  Medication Instructions:  Your physician recommends that you continue on your current medications as directed. Please refer to the Current Medication list given to you today.  *If you need a refill on your cardiac medications before your next appointment, please call your pharmacy*  Follow-Up: At [facility], you and your health needs are our priority.  As part of our continuing mission to provide you with exceptional heart care, we have created designated Provider Care Teams.  These Care Teams include your primary Cardiologist (physician) and Advanced Practice Providers (APPs -  Physician Assistants and Nurse Practitioners) who all work together to provide you with the care you need, when you need it.  We recommend signing up for the patient portal called "MyChart".  Sign up information is provided on this After Visit Summary.  MyChart is used to connect with patients for Virtual Visits (Telemedicine).  Patients are able to view lab/test results, encounter notes, upcoming appointments, etc.  Non-urgent messages can be sent to your provider as well.   To learn more about what you can do with MyChart, go to [email or web address].    Your next appointment:   12 month(s)  The format for your next appointment:   In Person  Provider:   Dr. Gardiner Rhyme         Signed, Donato Heinz, MD  04/04/2022 9:23 AM    [place]

## 2022-04-04 NOTE — Patient Instructions (Signed)
Medication Instructions:  Your physician recommends that you continue on your current medications as directed. Please refer to the Current Medication list given to you today.  *If you need a refill on your cardiac medications before your next appointment, please call your pharmacy*  Follow-Up: At [facility], you and your health needs are our priority.  As part of our continuing mission to provide you with exceptional heart care, we have created designated Provider Care Teams.  These Care Teams include your primary Cardiologist (physician) and Advanced Practice Providers (APPs -  Physician Assistants and Nurse Practitioners) who all work together to provide you with the care you need, when you need it.  We recommend signing up for the patient portal called "MyChart".  Sign up information is provided on this After Visit Summary.  MyChart is used to connect with patients for Virtual Visits (Telemedicine).  Patients are able to view lab/test results, encounter notes, upcoming appointments, etc.  Non-urgent messages can be sent to your provider as well.   To learn more about what you can do with MyChart, go to [email or web address].    Your next appointment:   12 month(s)  The format for your next appointment:   In Person  Provider:   Dr. Gardiner Rhyme

## 2022-05-10 ENCOUNTER — Telehealth: Payer: Self-pay

## 2022-05-10 NOTE — Patient Outreach (Signed)
  Care Coordination   Initial Visit Note   05/10/2022 Name: Robert Rhodes MRN: [id number] DOB: Mar 01, 1946  Robert Rhodes is a 76 y.o. year old male who sees [name], MD for primary care. I spoke with  Jennefer Bravo by phone today.  What matters to the patients health and wellness today?  Patient voices that things are going well for him at present.  Denies any RN CM/THN needs or concerns at this time.    Goals Addressed             This Visit's Progress    COMPLETED: Care Coordination-no follow up required       Care Coordination Interventions: Provided education to patient re: [facility] services Assessed social determinant of health barriers Reviewed health maintenance measures-pt voices he has already gotten flu,RSV an COVID booster this season          SDOH assessments and interventions completed:  Yes  SDOH Interventions Today    Flowsheet Row Most Recent Value  SDOH Interventions   Food Insecurity Interventions Intervention Not Indicated  Transportation Interventions Intervention Not Indicated        Care Coordination Interventions:  Yes, provided   Follow up plan: No further intervention required.   Encounter Outcome:  Pt. Visit Completed   Enzo Montgomery, RN,BSN,CCM [facility] Telephonic Care Management Coordinator Direct Phone: [phone] Toll Free: [phone] Fax: [phone]

## 2022-06-02 ENCOUNTER — Encounter: Payer: Self-pay | Admitting: Family Medicine

## 2022-06-02 ENCOUNTER — Ambulatory Visit: Payer: Medicare HMO | Attending: Family Medicine | Admitting: Family Medicine

## 2022-06-02 VITALS — BP 137/79 | HR 101 | Temp 98.9°F | Ht 69.0 in | Wt 237.0 lb

## 2022-06-02 DIAGNOSIS — F40240 Claustrophobia: Secondary | ICD-10-CM

## 2022-06-02 MED ORDER — HYDROXYZINE HCL 25 MG PO TABS: 25.0000 mg | ORAL_TABLET | Freq: Three times a day (TID) | ORAL | 1 refills | Status: DC | PRN

## 2022-06-02 NOTE — Patient Instructions (Signed)

## 2022-06-02 NOTE — Progress Notes (Signed)
Claustrophobia

## 2022-06-02 NOTE — Progress Notes (Signed)
Subjective:  Patient ID: Robert Rhodes, male    DOB: 14-Dec-1945  Age: 76 y.o. MRN: [id number]  CC: Hypertension   HPI Robert Rhodes is a 75 y.o. year old male with a history of hypertension, schizophrenia (diagnosed in the 60s, stable off medications), ulcerative colitis, osteoarthritis of the knees (status post right TKA) here for chronic disease management.   Interval History: Today he presents for an acute visit. He had claustrophobia in the 60s with feelings of being close did not and would lead to his getting out of the house to 'get some air' and he had to use medications as needed.  Does not recall the name of the medication he used.  Lately that has occurred and he would notice some anxiety, tachypnea, tachycardia. Symptoms occur sparingly.   He has slacked off on his exercise regimen and has been eating a lot of junk food. Past Medical History:  Diagnosis Date   Adenomatous colon polyp    Arthritis    bilateral knees-needs replacements   Claustrophobia    Colon cancer ([place]) 2015   COVID-19    Eye abnormalities    right eye seen in ED 02/17/2014 wearing eye patch using eye drops   GERD (gastroesophageal reflux disease)    with certain foods   Glaucoma    not on eye drops at this time (10/21/2020)   Hypertension    on meds   Irregular heartbeat    per pt/doesn't know what it does   Osteoarthritis    bilat knees  needs replacement   Osteoporosis    pt denies   Substance abuse ([place])    stopped over 3 years ago   UC (ulcerative colitis) ([place])    Vitamin D deficiency     Past Surgical History:  Procedure Laterality Date   COLON RESECTION N/A 08/23/2013   Procedure: Laparoscopic total abdominal colectomy and hernia repair;  Surgeon: Leighton Ruff, MD;  [facility];  Service: General;  Laterality: N/A;   COLON SURGERY  08/2013   COLONOSCOPY  11/02/2020   EUS N/A 07/18/2013   Procedure: UPPER ENDOSCOPIC ULTRASOUND (EUS) RADIAL;  Surgeon: Milus Banister, MD;   [facility];  Service: Endoscopy;  Laterality: N/A;   EYE SURGERY     FLEXIBLE SIGMOIDOSCOPY N/A 2020   prep good   HERNIA REPAIR  [place]   [place]   INCISIONAL HERNIA REPAIR N/A 02/20/2014   Procedure: LAP ASSISTED INCISIONAL HERNIA REPAIR LYSIS OF ADHESIONS;  Surgeon: Leighton Ruff, MD;  [facility];  Service: General;  Laterality: N/A;  converted to open @ [place] N/A 02/20/2014   Procedure: HERNIA REPAIR INCISIONAL;  Surgeon: Leighton Ruff, MD;  [facility];  Service: General;  Laterality: N/A;  With MESH   INTRAOCULAR LENS INSERTION Bilateral    6 yrs ago   TOTAL KNEE ARTHROPLASTY Right 11/11/2021   Procedure: RIGHT TOTAL KNEE ARTHROPLASTY;  Surgeon: Leandrew Koyanagi, MD;  Location: [place] [facility];  Service: Orthopedics;  Laterality: Right;   VENTRAL HERNIA REPAIR      Family History  Problem Relation Age of Onset   Cancer Mother        type unknown-in her leg   Diabetes Father    Heart disease Father    Heart disease Sister    Heart attack Brother    Healthy Sister    Colon cancer Neg Hx    Esophageal cancer Neg Hx  Rectal cancer Neg Hx    Stomach cancer Neg Hx    Colon polyps Neg Hx     Social History   Socioeconomic History   Marital status: Single    Spouse name: Not on file   Number of children: 0   Years of education: Not on file   Highest education level: Not on file  Occupational History   Occupation: retired  Tobacco Use   Smoking status: Former    Packs/day: 0.00    Types: Cigarettes    Quit date: 06/14/2003    Years since quitting: 18.9   Smokeless tobacco: Never   Tobacco comments:    quit in July 2005  Vaping Use   Vaping Use: Never used  Substance and Sexual Activity   Alcohol use: No   Drug use: Not Currently    Types: 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  Other Topics Concern   Not on file  Social History Narrative   Not on file   Social Determinants of  Health   Financial Resource Strain: Not on file  Food Insecurity: No Food Insecurity (05/10/2022)   Hunger Vital Sign    Worried About Running Out of Food in the Last Year: Never true    [place] in the Last Year: Never true  Transportation Needs: No Transportation Needs (05/10/2022)   PRAPARE - Hydrologist (Medical): No    Lack of Transportation (Non-Medical): No  Physical Activity: Not on file  Stress: Not on file  Social Connections: Not on file    Allergies  Allergen Reactions   Ace Inhibitors Other (See Comments)    Unknown reaction- "affecting my breathing in some kind of way"   Advil [Ibuprofen] Other (See Comments)    Muscle tightness   Aspirin Nausea And Vomiting   Tylenol [Acetaminophen] Other (See Comments)    Muscle tightness    Outpatient Medications Prior to Visit  Medication Sig Dispense Refill   amLODipine (NORVASC) 5 MG tablet TAKE 1 TABLET BY MOUTH ONCE DAILY- HYPERTENSION 90 tablet 1   atorvastatin (LIPITOR) 20 MG tablet Take 1 tablet (20 mg total) by mouth daily. 90 tablet 1   FIBER ADULT GUMMIES PO Take 2 tablets by mouth daily.     magnesium oxide (MAG-OX) 400 MG tablet Take 400 mg by mouth daily.     omeprazole (PRILOSEC OTC) 20 MG tablet Take 20 mg by mouth daily as needed (acid reflux).     methocarbamol (ROBAXIN-750) 750 MG tablet Take 1 tablet (750 mg total) by mouth 2 (two) times daily as needed for muscle spasms. (Patient not taking: Reported on 04/04/2022) 20 tablet 2   oxyCODONE (ROXICODONE) 5 MG immediate release tablet Take 1 tab po every 8 hours prn pain. (Patient not taking: Reported on 04/04/2022) 40 tablet 0   No facility-administered medications prior to visit.     ROS Review of Systems  Constitutional:  Negative for activity change and appetite change.  HENT:  Negative for sinus pressure and sore throat.   Respiratory:  Negative for chest tightness, shortness of breath and wheezing.    Cardiovascular:  Negative for chest pain and palpitations.  Gastrointestinal:  Negative for abdominal distention, abdominal pain and constipation.  Genitourinary: Negative.   Musculoskeletal: Negative.   Psychiatric/Behavioral:  Negative for behavioral problems and dysphoric mood.     Objective:  BP 137/79   Pulse (!) 101  Temp 98.9 F (37.2 C) (Oral)   Ht 5' 9"  (1.753 m)   Wt 237 lb (107.5 kg)   SpO2 99%   BMI 35.00 kg/m      06/02/2022   11:22 AM 06/02/2022   10:46 AM 04/04/2022    9:06 AM  BP/Weight  Systolic BP 476 546 503  Diastolic BP 79 76 80  Wt. (Lbs)  237 229  BMI  35 kg/m2 33.82 kg/m2      Physical Exam Constitutional:      Appearance: He is well-developed.  Cardiovascular:     Rate and Rhythm: Normal rate.     Heart sounds: Normal heart sounds. No murmur heard. Pulmonary:     Effort: Pulmonary effort is normal.     Breath sounds: Normal breath sounds. No wheezing or rales.  Chest:     Chest wall: No tenderness.  Abdominal:     General: Bowel sounds are normal. There is no distension.     Palpations: Abdomen is soft. There is no mass.     Tenderness: There is no abdominal tenderness.  Musculoskeletal:        General: Normal range of motion.     Right lower leg: No edema.     Left lower leg: No edema.  Neurological:     Mental Status: He is alert and oriented to person, place, and time.  Psychiatric:        Mood and Affect: Mood normal.        Latest Ref Rng & Units 11/30/2021   11:12 AM 11/23/2021    2:38 PM 11/17/2021   11:15 AM  CMP  Glucose 70 - 99 mg/dL 81  92  106   BUN 8 - 27 mg/dL 11  9  11    Creatinine 0.76 - 1.27 mg/dL 0.96  1.21  1.28   Sodium 134 - 144 mmol/L 132  129  130   Potassium 3.5 - 5.2 mmol/L 3.8  4.5  3.6   Chloride 96 - 106 mmol/L 94  95  96   CO2 20 - 29 mmol/L 23  24  27    Calcium 8.6 - 10.2 mg/dL 9.7  9.2  9.3   Total Protein 6.5 - 8.1 g/dL   8.3   Total Bilirubin 0.3 - 1.2 mg/dL   1.3   Alkaline Phos 38 -  126 U/L   73   AST 15 - 41 U/L   76   ALT 0 - 44 U/L   83     Lipid Panel     Component Value Date/Time   CHOL 128 10/11/2021 1041   TRIG 81 10/11/2021 1041   HDL 44 10/11/2021 1041   CHOLHDL 2.9 10/11/2021 1041   CHOLHDL 4.2 03/05/2013 1021   VLDL 28 03/05/2013 1021   LDLCALC 68 10/11/2021 1041    CBC    Component Value Date/Time   WBC 11.7 (H) 11/23/2021 1438   RBC 4.17 (L) 11/23/2021 1438   HGB 11.7 (L) 11/23/2021 1438   HGB 13.6 10/22/2019 1043   HCT 35.3 (L) 11/23/2021 1438   HCT 41.4 10/22/2019 1043   PLT 637 (H) 11/23/2021 1438   PLT 429 10/22/2019 1043   MCV 84.7 11/23/2021 1438   MCV 86 10/22/2019 1043   MCH 28.1 11/23/2021 1438   MCHC 33.1 11/23/2021 1438   RDW 14.0 11/23/2021 1438   RDW 14.4 10/22/2019 1043   LYMPHSABS 2.6 11/17/2021 1115   LYMPHSABS 2.6 10/22/2019 1043   MONOABS 1.4 (  H) 11/17/2021 1115   EOSABS 0.3 11/17/2021 1115   EOSABS 0.2 10/22/2019 1043   BASOSABS 0.0 11/17/2021 1115   BASOSABS 0.1 10/22/2019 1043    Lab Results  Component Value Date   HGBA1C 5.5 10/22/2019    Assessment & Plan:  1. Claustrophobia Intermittent episodes Will place on trial of hydroxyzine Advised that if symptoms are uncontrolled he may need to be evaluated further to see if his schizophrenia needs treatment. - hydrOXYzine (ATARAX) 25 MG tablet; Take 1 tablet (25 mg total) by mouth 3 (three) times daily as needed. As needed for anxiety  Dispense: 60 tablet; Refill: 1    Meds ordered this encounter  Medications   hydrOXYzine (ATARAX) 25 MG tablet    Sig: Take 1 tablet (25 mg total) by mouth 3 (three) times daily as needed. As needed for anxiety    Dispense:  60 tablet    Refill:  1    Follow-up: Return for previously scheduled appointment.       [name], MD, FAAFP. [facility] and [place], [place]   06/02/2022, 12:53 PM

## 2022-06-24 DIAGNOSIS — E669 Obesity, unspecified: Secondary | ICD-10-CM | POA: Diagnosis not present

## 2022-06-24 DIAGNOSIS — F1121 Opioid dependence, in remission: Secondary | ICD-10-CM | POA: Diagnosis not present

## 2022-06-24 DIAGNOSIS — E785 Hyperlipidemia, unspecified: Secondary | ICD-10-CM | POA: Diagnosis not present

## 2022-06-24 DIAGNOSIS — F1021 Alcohol dependence, in remission: Secondary | ICD-10-CM | POA: Diagnosis not present

## 2022-06-24 DIAGNOSIS — R2681 Unsteadiness on feet: Secondary | ICD-10-CM | POA: Diagnosis not present

## 2022-06-24 DIAGNOSIS — F1421 Cocaine dependence, in remission: Secondary | ICD-10-CM | POA: Diagnosis not present

## 2022-06-24 DIAGNOSIS — K5190 Ulcerative colitis, unspecified, without complications: Secondary | ICD-10-CM | POA: Diagnosis not present

## 2022-06-24 DIAGNOSIS — I10 Essential (primary) hypertension: Secondary | ICD-10-CM | POA: Diagnosis not present

## 2022-06-24 DIAGNOSIS — M170 Bilateral primary osteoarthritis of knee: Secondary | ICD-10-CM | POA: Diagnosis not present

## 2022-07-18 DIAGNOSIS — H31023 Solar retinopathy, bilateral: Secondary | ICD-10-CM | POA: Diagnosis not present

## 2022-07-18 DIAGNOSIS — H40052 Ocular hypertension, left eye: Secondary | ICD-10-CM | POA: Diagnosis not present

## 2022-07-18 DIAGNOSIS — H31013 Macula scars of posterior pole (postinflammatory) (post-traumatic), bilateral: Secondary | ICD-10-CM | POA: Diagnosis not present

## 2022-07-18 DIAGNOSIS — H40013 Open angle with borderline findings, low risk, bilateral: Secondary | ICD-10-CM | POA: Diagnosis not present

## 2022-09-20 ENCOUNTER — Encounter: Payer: Self-pay | Admitting: Family Medicine

## 2022-09-20 ENCOUNTER — Ambulatory Visit: Payer: Medicare HMO | Attending: Family Medicine | Admitting: Family Medicine

## 2022-09-20 VITALS — BP 128/85 | HR 87 | Temp 98.8°F | Ht 69.0 in | Wt 225.0 lb

## 2022-09-20 DIAGNOSIS — F250 Schizoaffective disorder, bipolar type: Secondary | ICD-10-CM

## 2022-09-20 DIAGNOSIS — Z9189 Other specified personal risk factors, not elsewhere classified: Secondary | ICD-10-CM | POA: Diagnosis not present

## 2022-09-20 DIAGNOSIS — K5190 Ulcerative colitis, unspecified, without complications: Secondary | ICD-10-CM | POA: Diagnosis not present

## 2022-09-20 DIAGNOSIS — I10 Essential (primary) hypertension: Secondary | ICD-10-CM

## 2022-09-20 MED ORDER — ATORVASTATIN CALCIUM 20 MG PO TABS: 20.0000 mg | ORAL_TABLET | Freq: Every day | ORAL | 1 refills | Status: DC

## 2022-09-20 MED ORDER — AMLODIPINE BESYLATE 5 MG PO TABS: ORAL_TABLET | ORAL | 1 refills | Status: DC

## 2022-09-20 NOTE — Patient Instructions (Signed)
Exercising to Stay Healthy To become healthy and stay healthy, it is recommended that you do moderate-intensity and vigorous-intensity exercise. You can tell that you are exercising at a moderate intensity if your heart starts beating faster and you start breathing faster but can still hold a conversation. You can tell that you are exercising at a vigorous intensity if you are breathing much harder and faster and cannot hold a conversation while exercising. How can exercise benefit me? Exercising regularly is important. It has many health benefits, such as: Improving overall fitness, flexibility, and endurance. Increasing bone density. Helping with weight control. Decreasing body fat. Increasing muscle strength and endurance. Reducing stress and tension, anxiety, depression, or anger. Improving overall health. What guidelines should I follow while exercising? Before you start a new exercise program, talk with your health care provider. Do not exercise so much that you hurt yourself, feel dizzy, or get very short of breath. Wear comfortable clothes and wear shoes with good support. Drink plenty of water while you exercise to prevent dehydration or heat stroke. Work out until your breathing and your heartbeat get faster (moderate intensity). How often should I exercise? Choose an activity that you enjoy, and set realistic goals. Your health care provider can help you make an activity plan that is individually designed and works best for you. Exercise regularly as told by your health care provider. This may include: Doing strength training two times a week, such as: Lifting weights. Using resistance bands. Push-ups. Sit-ups. Yoga. Doing a certain intensity of exercise for a given amount of time. Choose from these options: A total of 150 minutes of moderate-intensity exercise every week. A total of 75 minutes of vigorous-intensity exercise every week. A mix of moderate-intensity and  vigorous-intensity exercise every week. Children, pregnant women, people who have not exercised regularly, people who are overweight, and older adults may need to talk with a health care provider about what activities are safe to perform. If you have a medical condition, be sure to talk with your health care provider before you start a new exercise program. What are some exercise ideas? Moderate-intensity exercise ideas include: Walking 1 mile (1.6 km) in about 15 minutes. Biking. Hiking. Golfing. Dancing. Water aerobics. Vigorous-intensity exercise ideas include: Walking 4.5 miles (7.2 km) or more in about 1 hour. Jogging or running 5 miles (8 km) in about 1 hour. Biking 10 miles (16.1 km) or more in about 1 hour. Lap swimming. Roller-skating or in-line skating. Cross-country skiing. Vigorous competitive sports, such as football, basketball, and soccer. Jumping rope. Aerobic dancing. What are some everyday activities that can help me get exercise? Yard work, such as: Pushing a lawn mower. Raking and bagging leaves. Washing your car. Pushing a stroller. Shoveling snow. Gardening. Washing windows or floors. How can I be more active in my day-to-day activities? Use stairs instead of an elevator. Take a walk during your lunch break. If you drive, park your car farther away from your work or school. If you take public transportation, get off one stop early and walk the rest of the way. Stand up or walk around during all of your indoor phone calls. Get up, stretch, and walk around every 30 minutes throughout the day. Enjoy exercise with a friend. Support to continue exercising will help you keep a regular routine of activity. Where to find more information You can find more information about exercising to stay healthy from: U.S. Department of Health and Human Services: www.hhs.gov Centers for Disease Control and Prevention (  CDC): www.cdc.gov Summary Exercising regularly is  important. It will improve your overall fitness, flexibility, and endurance. Regular exercise will also improve your overall health. It can help you control your weight, reduce stress, and improve your bone density. Do not exercise so much that you hurt yourself, feel dizzy, or get very short of breath. Before you start a new exercise program, talk with your health care provider. This information is not intended to replace advice given to you by your health care provider. Make sure you discuss any questions you have with your health care provider. Document Revised: 09/25/2020 Document Reviewed: 09/25/2020 Elsevier Patient Education  2023 [employer]  

## 2022-09-20 NOTE — Progress Notes (Signed)
Subjective:  Patient ID: Robert Rhodes, male    DOB: 28-Sep-1945  Age: 77 y.o. MRN: [id number]  CC: Hypertension   HPI Robert Rhodes is a 77 y.o. year old male with a history of hypertension, schizophrenia (diagnosed in the 60s, stable off medications), ulcerative colitis, osteoarthritis of the knees (status post right TKA) here for chronic disease management.     Interval History:   At his last visit, he had Complains of claustrophobia and was placed on Hydroxyzine which he states was ineffective and so he discontinued it. He now does meditation which has been beneficial. This was a one time episode. His Schizophrenia has not flared up and he states he has not had a problem since the 60's and he does not see Behavioral health.  Endorses not having any ulcerative colitis flares and has not needed his medications x1 year. He sees his GI infrequently. Adherent with his antihypertensive but not adherent with his Statin. Does not exercise much Past Medical History:  Diagnosis Date   Adenomatous colon polyp    Arthritis    bilateral knees-needs replacements   Claustrophobia    Colon cancer 2015   COVID-19    Eye abnormalities    right eye seen in ED 02/17/2014 wearing eye patch using eye drops   GERD (gastroesophageal reflux disease)    with certain foods   Glaucoma    not on eye drops at this time (10/21/2020)   Hypertension    on meds   Irregular heartbeat    per pt/doesn't know what it does   Osteoarthritis    bilat knees  needs replacement   Osteoporosis    pt denies   Substance abuse    stopped over 3 years ago   UC (ulcerative colitis)    Vitamin D deficiency     Past Surgical History:  Procedure Laterality Date   COLON RESECTION N/A 08/23/2013   Procedure: Laparoscopic total abdominal colectomy and hernia repair;  Surgeon: Romie Levee, MD;  [facility];  Service: General;  Laterality: N/A;   COLON SURGERY  08/2013   COLONOSCOPY  11/02/2020   EUS N/A  07/18/2013   Procedure: UPPER ENDOSCOPIC ULTRASOUND (EUS) RADIAL;  Surgeon: Rachael Fee, MD;  [facility];  Service: Endoscopy;  Laterality: N/A;   EYE SURGERY     FLEXIBLE SIGMOIDOSCOPY N/A 2020   prep good   HERNIA REPA[place]  [place]   [place]   INCISIONAL HERNIA REPAIR N/A 02/20/2014   Procedure: LAP ASSISTED INCISIONAL HERNIA REPAIR LYSIS OF ADHESIONS;  Surgeon: Romie Levee, MD;  [facility];  Service: General;  Laterality: N/A;  converted to open @ 0935   INCISIONAL HERNIA REPAIR N/A 02/20/2014   Procedure: HERNIA REPAIR INCISIONAL;  Surgeon: Romie Levee, MD;  [facility];  Service: General;  Laterality: N/A;  With MESH   INTRAOCULAR LENS INSERTION Bilateral    6 yrs ago   TOTAL KNEE ARTHROPLASTY Right 11/11/2021   Procedure: RIGHT TOTAL KNEE ARTHROPLASTY;  Surgeon: Tarry Kos, MD;  [facility];  Service: Orthopedics;  Laterality: Right;   VENTRAL HERNIA REPAIR      Family History  Problem Relation Age of Onset   Cancer Mother        type unknown-in her leg   Diabetes Father    Heart disease Father    Heart disease Sister    Heart attack Brother    Healthy Sister    Colon  cancer Neg Hx    Esophageal cancer Neg Hx    Rectal cancer Neg Hx    Stomach cancer Neg Hx    Colon polyps Neg Hx     Social History   Socioeconomic History   Marital status: Single    Spouse name: Not on file   Number of children: 0   Years of education: Not on file   Highest education level: GED or equivalent  Occupational History   Occupation: retired  Tobacco Use   Smoking status: Former    Packs/day: 0    Types: Cigarettes    Quit date: 06/14/2003    Years since quitting: 19.2   Smokeless tobacco: Never   Tobacco comments:    quit in July 2005  Vaping Use   Vaping Use: Never used  Substance and Sexual Activity   Alcohol use: No   Drug use: Not Currently    Types: 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  Other  Topics Concern   Not on file  Social History Narrative   Not on file   Social Determinants of Health   Financial Resource Strain: Medium Risk (09/16/2022)   Overall Financial Resource Strain (CARDIA)    Difficulty of Paying Living Expenses: Somewhat hard  Food Insecurity: Food Insecurity Present (09/16/2022)   Hunger Vital Sign    Worried About Running Out of Food in the Last Year: Sometimes true    Ran Out of Food in the Last Year: Sometimes true  Transportation Needs: Unmet Transportation Needs (09/16/2022)   PRAPARE - Transportation    Lack of Transportation (Medical): No    Lack of Transportation (Non-Medical): Yes  Physical Activity: Sufficiently Active (09/16/2022)   Exercise Vital Sign    Days of Exercise per Week: 5 days    Minutes of Exercise per Session: 30 min  Stress: No Stress Concern Present (09/16/2022)   [employer] of Occupational Health - Occupational Stress Questionnaire    Feeling of Stress : Not at all  Social Connections: Unknown (09/16/2022)   Social Connection and Isolation Panel [NHANES]    Frequency of Communication with Friends and Family: Twice a week    Frequency of Social Gatherings with Friends and Family: Once a week    Attends Religious Services: Patient declined    Database administrator or Organizations: No    Attends Engineer, structural: Not on file    Marital Status: Never married    Allergies  Allergen Reactions   Ace Inhibitors Other (See Comments)    Unknown reaction- "affecting my breathing in some kind of way"   Advil [Ibuprofen] Other (See Comments)    Muscle tightness   Aspirin Nausea And Vomiting   Tylenol [Acetaminophen] Other (See Comments)    Muscle tightness    Outpatient Medications Prior to Visit  Medication Sig Dispense Refill   amLODipine (NORVASC) 5 MG tablet TAKE 1 TABLET BY MOUTH ONCE DAILY- HYPERTENSION 90 tablet 1   FIBER ADULT GUMMIES PO Take 2 tablets by mouth daily. (Patient not taking: Reported on  09/20/2022)     atorvastatin (LIPITOR) 20 MG tablet Take 1 tablet (20 mg total) by mouth daily. (Patient not taking: Reported on 09/20/2022) 90 tablet 1   hydrOXYzine (ATARAX) 25 MG tablet Take 1 tablet (25 mg total) by mouth 3 (three) times daily as needed. As needed for anxiety (Patient not taking: Reported on 09/20/2022) 60 tablet 1   magnesium oxide (MAG-OX)  400 MG tablet Take 400 mg by mouth daily. (Patient not taking: Reported on 09/20/2022)     methocarbamol (ROBAXIN-750) 750 MG tablet Take 1 tablet (750 mg total) by mouth 2 (two) times daily as needed for muscle spasms. (Patient not taking: Reported on 09/20/2022) 20 tablet 2   omeprazole (PRILOSEC OTC) 20 MG tablet Take 20 mg by mouth daily as needed (acid reflux). (Patient not taking: Reported on 09/20/2022)     oxyCODONE (ROXICODONE) 5 MG immediate release tablet Take 1 tab po every 8 hours prn pain. (Patient not taking: Reported on 09/20/2022) 40 tablet 0   No facility-administered medications prior to visit.     ROS Review of Systems  Constitutional:  Negative for activity change and appetite change.  HENT:  Negative for sinus pressure and sore throat.   Respiratory:  Negative for chest tightness, shortness of breath and wheezing.   Cardiovascular:  Negative for chest pain and palpitations.  Gastrointestinal:  Negative for abdominal distention, abdominal pain and constipation.  Genitourinary: Negative.   Musculoskeletal: Negative.   Psychiatric/Behavioral:  Negative for behavioral problems and dysphoric mood.     Objective:  BP (!) 142/79   Pulse 87   Temp 98.8 F (37.1 C)   Ht 5\' 9"  (1.753 m)   Wt 225 lb (102.1 kg)   SpO2 99%   BMI 33.23 kg/m      09/20/2022   10:44 AM 06/02/2022   11:22 AM 06/02/2022   10:46 AM  BP/Weight  Systolic BP 142 137 155  Diastolic BP 79 79 76  Wt. (Lbs) 225  237  BMI 33.23 kg/m2  35 kg/m2      Physical Exam Constitutional:      Appearance: He is well-developed.  Cardiovascular:     Rate  and Rhythm: Normal rate.     Heart sounds: Normal heart sounds. No murmur heard. Pulmonary:     Effort: Pulmonary effort is normal.     Breath sounds: Normal breath sounds. No wheezing or rales.  Chest:     Chest wall: No tenderness.  Abdominal:     General: Bowel sounds are normal. There is no distension.     Palpations: Abdomen is soft. There is no mass.     Tenderness: There is no abdominal tenderness.  Musculoskeletal:        General: Normal range of motion.     Right lower leg: No edema.     Left lower leg: No edema.  Neurological:     Mental Status: He is alert and oriented to person, place, and time.  Psychiatric:        Mood and Affect: Mood normal.        Latest Ref Rng & Units 11/30/2021   11:12 AM 11/23/2021    2:38 PM 11/17/2021   11:15 AM  CMP  Glucose 70 - 99 mg/dL 81  92  [id number]   BUN 8 - 27 mg/dL 11  9  11    Creatinine 0.76 - 1.27 mg/dL 1.190.96  1.471.21  8.291.28   Sodium 134 - 144 mmol/L 132  129  130   Potassium 3.5 - 5.2 mmol/L 3.8  4.5  3.6   Chloride 96 - 106 mmol/L 94  95  96   CO2 20 - 29 mmol/L 23  24  27    Calcium 8.6 - 10.2 mg/dL 9.7  9.2  9.3   Total Protein 6.5 - 8.1 g/dL   8.3   Total Bilirubin 0.3 - 1.2 mg/dL  1.3   Alkaline Phos 38 - 126 U/L   73   AST 15 - 41 U/L   76   ALT 0 - 44 U/L   83     Lipid Panel     Component Value Date/Time   CHOL 128 10/11/2021 1041   TRIG 81 10/11/2021 1041   HDL 44 10/11/2021 1041   CHOLHDL 2.9 10/11/2021 1041   CHOLHDL 4.2 03/05/2013 1021   VLDL 28 03/05/2013 1021   LDLCALC 68 10/11/2021 1041    CBC    Component Value Date/Time   WBC 11.7 (H) 11/23/2021 1438   RBC 4.17 (L) 11/23/2021 1438   HGB 11.7 (L) 11/23/2021 1438   HGB 13.6 10/22/2019 1043   HCT 35.3 (L) 11/23/2021 1438   HCT 41.4 10/22/2019 1043   PLT 637 (H) 11/23/2021 1438   PLT 429 10/22/2019 1043   MCV 84.7 11/23/2021 1438   MCV 86 10/22/2019 1043   MCH 28.1 11/23/2021 1438   MCHC 33.1 11/23/2021 1438   RDW 14.0 11/23/2021 1438   RDW 14.4  10/22/2019 1043   LYMPHSABS 2.6 11/17/2021 1115   LYMPHSABS 2.6 10/22/2019 1043   MONOABS 1.4 (H) 11/17/2021 1115   EOSABS 0.3 11/17/2021 1115   EOSABS 0.2 10/22/2019 1043   BASOSABS 0.0 11/17/2021 1115   BASOSABS 0.1 10/22/2019 1043    Lab Results  Component Value Date   HGBA1C 5.5 10/22/2019   Assessment & Plan:  1. Schizoaffective disorder, bipolar type Stable Currently not under the care of behavioral health  2. Ulcerative colitis without complications, unspecified location No recent flares He is currently not on medications - CBC with Differential/Platelet  3. Essential hypertension, benign Controlled Continue current regimen Counseled on blood pressure goal of less than 130/80, low-sodium, DASH diet, medication compliance, 150 minutes of moderate intensity exercise per week. Discussed medication compliance, adverse effects. - amLODipine (NORVASC) 5 MG tablet; TAKE 1 TABLET BY MOUTH ONCE DAILY- HYPERTENSION  Dispense: 90 tablet; Refill: 1 - CMP14+EGFR  4. At increased risk for cardiovascular disease Has not been adherent with his statin Low-cholesterol diet Restart Lipitor - atorvastatin (LIPITOR) 20 MG tablet; Take 1 tablet (20 mg total) by mouth daily.  Dispense: 90 tablet; Refill: 1    Meds ordered this encounter  Medications   amLODipine (NORVASC) 5 MG tablet    Sig: TAKE 1 TABLET BY MOUTH ONCE DAILY- HYPERTENSION    Dispense:  90 tablet    Refill:  1   atorvastatin (LIPITOR) 20 MG tablet    Sig: Take 1 tablet (20 mg total) by mouth daily.    Dispense:  90 tablet    Refill:  1    Follow-up: Return in about 6 months (around 03/22/2023) for Chronic medical conditions.       Hoy Register, MD, FAAFP. [facility] [place], [place] [phone]   09/20/2022, 11:30 AM

## 2022-09-21 LAB — CBC WITH DIFFERENTIAL/PLATELET
Basophils Absolute: 0.1 10*3/uL (ref 0.0–0.2)
Basos: 1 %
EOS (ABSOLUTE): 0.2 10*3/uL (ref 0.0–0.4)
Eos: 3 %
Hematocrit: 41.1 % (ref 37.5–51.0)
Hemoglobin: 13.2 g/dL (ref 13.0–17.7)
Immature Grans (Abs): 0 10*3/uL (ref 0.0–0.1)
Immature Granulocytes: 0 %
Lymphocytes Absolute: 2.7 10*3/uL (ref 0.7–3.1)
Lymphs: 45 %
MCH: 26.7 pg (ref 26.6–33.0)
MCHC: 32.1 g/dL (ref 31.5–35.7)
MCV: 83 fL (ref 79–97)
Monocytes Absolute: 0.7 10*3/uL (ref 0.1–0.9)
Monocytes: 11 %
Neutrophils Absolute: 2.5 10*3/uL (ref 1.4–7.0)
Neutrophils: 40 %
Platelets: 443 10*3/uL (ref 150–450)
RBC: 4.94 x10E6/uL (ref 4.14–5.80)
RDW: 15.5 % — ABNORMAL HIGH (ref 11.6–15.4)
WBC: 6.1 10*3/uL (ref 3.4–10.8)

## 2022-09-21 LAB — CMP14+EGFR
ALT: 35 IU/L (ref 0–44)
AST: 30 IU/L (ref 0–40)
Albumin/Globulin Ratio: 1 — ABNORMAL LOW (ref 1.2–2.2)
Albumin: 3.8 g/dL (ref 3.8–4.8)
Alkaline Phosphatase: 88 IU/L (ref 44–121)
BUN/Creatinine Ratio: 6 — ABNORMAL LOW (ref 10–24)
BUN: 6 mg/dL — ABNORMAL LOW (ref 8–27)
Bilirubin Total: 0.3 mg/dL (ref 0.0–1.2)
CO2: 25 mmol/L (ref 20–29)
Calcium: 9.5 mg/dL (ref 8.6–10.2)
Chloride: 99 mmol/L (ref 96–106)
Creatinine, Ser: 1.08 mg/dL (ref 0.76–1.27)
Globulin, Total: 4 g/dL (ref 1.5–4.5)
Glucose: 83 mg/dL (ref 70–99)
Potassium: 4.5 mmol/L (ref 3.5–5.2)
Sodium: 136 mmol/L (ref 134–144)
Total Protein: 7.8 g/dL (ref 6.0–8.5)
eGFR: 70 mL/min/{1.73_m2} (ref >59)

## 2022-10-14 DIAGNOSIS — H40013 Open angle with borderline findings, low risk, bilateral: Secondary | ICD-10-CM | POA: Diagnosis not present

## 2022-10-14 DIAGNOSIS — H40052 Ocular hypertension, left eye: Secondary | ICD-10-CM | POA: Diagnosis not present

## 2022-10-24 ENCOUNTER — Telehealth: Payer: Self-pay | Admitting: Family Medicine

## 2022-10-24 NOTE — Telephone Encounter (Signed)
Copied from CRM [id number]. Topic: Medicare AWV >> Oct 24, 2022 11:21 AM Rushie Goltz wrote: Reason for CRM: Called patient to schedule Medicare Annual Wellness Visit (AWV). Left message for patient to call back and schedule Medicare Annual Wellness Visit (AWV).  Last date of AWV: 11/30/2021  Patient's insurance is [employer] (Calendar year)  Please schedule an AWVS appointment at any time with [facility] ANNUAL WELLNESS VISIT.  If any questions, please contact me at [phone].    Thank you,  [place] [facility] Direct dial  [phone]

## 2022-10-24 NOTE — Telephone Encounter (Signed)
Contacted Robert Rhodes to schedule their annual wellness visit. Appointment made for 10/26/2022.  Thank you,  [facility] Support [facility] Direct dial  [phone]

## 2022-10-26 ENCOUNTER — Ambulatory Visit: Payer: Medicare HMO | Attending: Family Medicine | Admitting: Pharmacist

## 2022-10-26 ENCOUNTER — Encounter: Payer: Self-pay | Admitting: Pharmacist

## 2022-10-26 ENCOUNTER — Ambulatory Visit (INDEPENDENT_AMBULATORY_CARE_PROVIDER_SITE_OTHER): Payer: Medicare HMO | Admitting: Orthopaedic Surgery

## 2022-10-26 ENCOUNTER — Ambulatory Visit (INDEPENDENT_AMBULATORY_CARE_PROVIDER_SITE_OTHER): Payer: Medicare HMO

## 2022-10-26 ENCOUNTER — Ambulatory Visit: Payer: Medicare HMO | Attending: Family Medicine

## 2022-10-26 VITALS — Ht 69.0 in | Wt 225.0 lb

## 2022-10-26 VITALS — Ht 66.0 in | Wt 211.0 lb

## 2022-10-26 DIAGNOSIS — M1712 Unilateral primary osteoarthritis, left knee: Secondary | ICD-10-CM | POA: Insufficient documentation

## 2022-10-26 DIAGNOSIS — Z96651 Presence of right artificial knee joint: Secondary | ICD-10-CM | POA: Diagnosis not present

## 2022-10-26 DIAGNOSIS — Z0000 Encounter for general adult medical examination without abnormal findings: Secondary | ICD-10-CM

## 2022-10-26 DIAGNOSIS — Z79899 Other long term (current) drug therapy: Secondary | ICD-10-CM

## 2022-10-26 NOTE — Patient Instructions (Signed)
Robert Rhodes , Thank you for taking time to come for your Medicare Wellness Visit. I appreciate your ongoing commitment to your health goals. Please review the following plan we discussed and let me know if I can assist you in the future.   These are the goals we discussed:  Goals      DIET - EAT MORE FRUITS AND VEGETABLES     Exercise 150 min/wk Moderate Activity        This is a list of the screening recommended for you and due dates:  Health Maintenance  Topic Date Due   Zoster (Shingles) Vaccine (2 of 2) 07/15/2019   COVID-19 Vaccine (4 - 2023-24 season) 02/11/2022   Colon Cancer Screening  11/03/2022   Flu Shot  01/12/2023   Eye exam for diabetics  04/16/2023   Medicare Annual Wellness Visit  10/26/2023   DTaP/Tdap/Td vaccine (2 - Td or Tdap) 11/26/2029   Pneumonia Vaccine  Completed   Hepatitis C Screening: USPSTF Recommendation to screen - Ages 55-79 yo.  Completed   HPV Vaccine  Aged Out    Advanced directives: Information on Advanced Care Planning can be found at [facility] Advance Health Care Directives Advance Health Care Directives ([email or web address]    Conditions/risks identified: Aim for 30 minutes of exercise or brisk walking, 6-8 glasses of water, and 5 servings of fruits and vegetables each day.   Next appointment: Follow up in one year for your annual wellness visit.   Happy Early Birthday!!!  Preventive Care 37 Years and Older, Male  Preventive care refers to lifestyle choices and visits with your health care provider that can promote health and wellness. What does preventive care include? A yearly physical exam. This is also called an annual well check. Dental exams once or twice a year. Routine eye exams. Ask your health care provider how often you should have your eyes checked. Personal lifestyle choices, including: Daily care of your teeth and gums. Regular physical activity. Eating a healthy diet. Avoiding tobacco and drug  use. Limiting alcohol use. Practicing safe sex. Taking low doses of aspirin every day. Taking vitamin and mineral supplements as recommended by your health care provider. What happens during an annual well check? The services and screenings done by your health care provider during your annual well check will depend on your age, overall health, lifestyle risk factors, and family history of disease. Counseling  Your health care provider may ask you questions about your: Alcohol use. Tobacco use. Drug use. Emotional well-being. Home and relationship well-being. Sexual activity. Eating habits. History of falls. Memory and ability to understand (cognition). Work and work Astronomer. Screening  You may have the following tests or measurements: Height, weight, and BMI. Blood pressure. Lipid and cholesterol levels. These may be checked every 5 years, or more frequently if you are over 25 years old. Skin check. Lung cancer screening. You may have this screening every year starting at age 77 if you have a 30-pack-year history of smoking and currently smoke or have quit within the past 15 years. Fecal occult blood test (FOBT) of the stool. You may have this test every year starting at age 59. Flexible sigmoidoscopy or colonoscopy. You may have a sigmoidoscopy every 5 years or a colonoscopy every 10 years starting at age 67. Prostate cancer screening. Recommendations will vary depending on your family history and other risks. Hepatitis C blood test. Hepatitis B blood test. Sexually transmitted disease (STD) testing. Diabetes screening. This is done  by checking your blood sugar (glucose) after you have not eaten for a while (fasting). You may have this done every 1-3 years. Abdominal aortic aneurysm (AAA) screening. You may need this if you are a current or former smoker. Osteoporosis. You may be screened starting at age 4 if you are at high risk. Talk with your health care provider about  your test results, treatment options, and if necessary, the need for more tests. Vaccines  Your health care provider may recommend certain vaccines, such as: Influenza vaccine. This is recommended every year. Tetanus, diphtheria, and acellular pertussis (Tdap, Td) vaccine. You may need a Td booster every 10 years. Zoster vaccine. You may need this after age 24. Pneumococcal 13-valent conjugate (PCV13) vaccine. One dose is recommended after age 28. Pneumococcal polysaccharide (PPSV23) vaccine. One dose is recommended after age 22. Talk to your health care provider about which screenings and vaccines you need and how often you need them. This information is not intended to replace advice given to you by your health care provider. Make sure you discuss any questions you have with your health care provider. Document Released: 06/26/2015 Document Revised: 02/17/2016 Document Reviewed: 03/31/2015 Elsevier Interactive Patient Education  2017 [employer] Prevention in the Home Falls can cause injuries. They can happen to people of all ages. There are many things you can do to make your home safe and to help prevent falls. What can I do on the outside of my home? Regularly fix the edges of walkways and driveways and fix any cracks. Remove anything that might make you trip as you walk through a door, such as a raised step or threshold. Trim any bushes or trees on the path to your home. Use bright outdoor lighting. Clear any walking paths of anything that might make someone trip, such as rocks or tools. Regularly check to see if handrails are loose or broken. Make sure that both sides of any steps have handrails. Any raised decks and porches should have guardrails on the edges. Have any leaves, snow, or ice cleared regularly. Use sand or salt on walking paths during winter. Clean up any spills in your garage right away. This includes oil or grease spills. What can I do in the bathroom? Use  night lights. Install grab bars by the toilet and in the tub and shower. Do not use towel bars as grab bars. Use non-skid mats or decals in the tub or shower. If you need to sit down in the shower, use a plastic, non-slip stool. Keep the floor dry. Clean up any water that spills on the floor as soon as it happens. Remove soap buildup in the tub or shower regularly. Attach bath mats securely with double-sided non-slip rug tape. Do not have throw rugs and other things on the floor that can make you trip. What can I do in the bedroom? Use night lights. Make sure that you have a light by your bed that is easy to reach. Do not use any sheets or blankets that are too big for your bed. They should not hang down onto the floor. Have a firm chair that has side arms. You can use this for support while you get dressed. Do not have throw rugs and other things on the floor that can make you trip. What can I do in the kitchen? Clean up any spills right away. Avoid walking on wet floors. Keep items that you use a lot in easy-to-reach places. If you need to  reach something above you, use a strong step stool that has a grab bar. Keep electrical cords out of the way. Do not use floor polish or wax that makes floors slippery. If you must use wax, use non-skid floor wax. Do not have throw rugs and other things on the floor that can make you trip. What can I do with my stairs? Do not leave any items on the stairs. Make sure that there are handrails on both sides of the stairs and use them. Fix handrails that are broken or loose. Make sure that handrails are as long as the stairways. Check any carpeting to make sure that it is firmly attached to the stairs. Fix any carpet that is loose or worn. Avoid having throw rugs at the top or bottom of the stairs. If you do have throw rugs, attach them to the floor with carpet tape. Make sure that you have a light switch at the top of the stairs and the bottom of the  stairs. If you do not have them, ask someone to add them for you. What else can I do to help prevent falls? Wear shoes that: Do not have high heels. Have rubber bottoms. Are comfortable and fit you well. Are closed at the toe. Do not wear sandals. If you use a stepladder: Make sure that it is fully opened. Do not climb a closed stepladder. Make sure that both sides of the stepladder are locked into place. Ask someone to hold it for you, if possible. Clearly mark and make sure that you can see: Any grab bars or handrails. First and last steps. Where the edge of each step is. Use tools that help you move around (mobility aids) if they are needed. These include: Canes. Walkers. Scooters. Crutches. Turn on the lights when you go into a dark area. Replace any light bulbs as soon as they burn out. Set up your furniture so you have a clear path. Avoid moving your furniture around. If any of your floors are uneven, fix them. If there are any pets around you, be aware of where they are. Review your medicines with your doctor. Some medicines can make you feel dizzy. This can increase your chance of falling. Ask your doctor what other things that you can do to help prevent falls. This information is not intended to replace advice given to you by your health care provider. Make sure you discuss any questions you have with your health care provider. Document Released: 03/26/2009 Document Revised: 11/05/2015 Document Reviewed: 07/04/2014 Elsevier Interactive Patient Education  2017 [employer].

## 2022-10-26 NOTE — Progress Notes (Signed)
Subjective:   Robert Rhodes is a 77 y.o. male who presents for Medicare Annual/Subsequent preventive examination.  I connected with  Robert Rhodes on 10/26/22 by a audio enabled telemedicine application and verified that I am speaking with the correct person using two identifiers.  Patient Location: Home  Provider Location: Home Office  I discussed the limitations of evaluation and management by telemedicine. The patient expressed understanding and agreed to proceed.  Review of Systems     Cardiac Risk Factors include: advanced age (>71men, >46 women);hypertension;male gender     Objective:    Today's Vitals   10/26/22 1341  Weight: 225 lb (102.1 kg)  Height: 5\' 9"  (1.753 m)   Body mass index is 33.23 kg/m.     10/26/2022    9:21 PM 12/02/2021    1:54 PM 11/30/2021   10:10 AM 11/23/2021    1:57 PM 11/11/2021    4:00 PM 11/11/2021    5:50 AM 11/03/2021    9:17 AM  Advanced Directives  Does Patient Have a Medical Advance Directive? No No No No No No No  Would patient like information on creating a medical advance directive? Yes (MAU/Ambulatory/Procedural Areas - Information given)  Yes (ED - Information included in AVS) No - Patient declined No - Patient declined  No - Patient declined    Current Medications (verified) Outpatient Encounter Medications as of 10/26/2022  Medication Sig   amLODipine (NORVASC) 5 MG tablet TAKE 1 TABLET BY MOUTH ONCE DAILY- HYPERTENSION   atorvastatin (LIPITOR) 20 MG tablet Take 1 tablet (20 mg total) by mouth daily.   FIBER ADULT GUMMIES PO Take 2 tablets by mouth daily.   No facility-administered encounter medications on file as of 10/26/2022.    Allergies (verified) Ace inhibitors, Advil [ibuprofen], Aspirin, and Tylenol [acetaminophen]   History: Past Medical History:  Diagnosis Date   Adenomatous colon polyp    Arthritis    bilateral knees-needs replacements   Claustrophobia    Colon cancer (HCC) 2015   COVID-19    Eye  abnormalities    right eye seen in ED 02/17/2014 wearing eye patch using eye drops   GERD (gastroesophageal reflux disease)    with certain foods   Glaucoma    not on eye drops at this time (10/21/2020)   Hypertension    on meds   Irregular heartbeat    per pt/doesn't know what it does   Osteoarthritis    bilat knees  needs replacement   Osteoporosis    pt denies   Substance abuse (HCC)    stopped over 3 years ago   UC (ulcerative colitis) (HCC)    Vitamin D deficiency    Past Surgical History:  Procedure Laterality Date   COLON RESECTION N/A 08/23/2013   Procedure: Laparoscopic total abdominal colectomy and hernia repair;  Surgeon: Romie Levee, MD;  Location: [facility];  Service: General;  Laterality: N/A;   COLON SURGERY  08/2013   COLONOSCOPY  11/02/2020   EUS N/A 07/18/2013   Procedure: UPPER ENDOSCOPIC ULTRASOUND (EUS) RADIAL;  Surgeon: Rachael Fee, MD;  Location: [facility];  Service: Endoscopy;  Laterality: N/A;   EYE SURGERY     FLEXIBLE SIGMOIDOSCOPY N/A 2020   prep good   HERNIA REPAIR  2005   In [place] in a mesh   INCISIONAL HERNIA REPAIR N/A 02/20/2014   Procedure: LAP ASSISTED INCISIONAL HERNIA REPAIR LYSIS OF ADHESIONS;  Surgeon: Romie Levee, MD;  Location: [facility];  Service: General;  Laterality: N/A;  converted to open @ 0935   INCISIONAL HERNIA REPAIR N/A 02/20/2014   Procedure: HERNIA REPAIR INCISIONAL;  Surgeon: Romie Levee, MD;  Location: [facility];  Service: General;  Laterality: N/A;  With MESH   INTRAOCULAR LENS INSERTION Bilateral    6 yrs ago   TOTAL KNEE ARTHROPLASTY Right 11/11/2021   Procedure: RIGHT TOTAL KNEE ARTHROPLASTY;  Surgeon: Tarry Kos, MD;  Location: [facility];  Service: Orthopedics;  Laterality: Right;   VENTRAL HERNIA REPAIR     Family History  Problem Relation Age of Onset   Cancer Mother        type unknown-in her leg   Diabetes Father    Heart disease Father    Heart disease Sister    Heart attack Brother    Healthy Sister     Colon cancer Neg Hx    Esophageal cancer Neg Hx    Rectal cancer Neg Hx    Stomach cancer Neg Hx    Colon polyps Neg Hx    Social History   Socioeconomic History   Marital status: Single    Spouse name: Not on file   Number of children: 0   Years of education: Not on file   Highest education level: GED or equivalent  Occupational History   Occupation: retired  Tobacco Use   Smoking status: Former    Packs/day: 0    Types: Cigarettes    Quit date: 06/14/2003    Years since quitting: 19.3   Smokeless tobacco: Never   Tobacco comments:    quit in July 2005  Vaping Use   Vaping Use: Never used  Substance and Sexual Activity   Alcohol use: No   Drug use: Not Currently    Types: 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  Other Topics Concern   Not on file  Social History Narrative   Not on file   Social Determinants of Health   Financial Resource Strain: Medium Risk (10/26/2022)   Overall Financial Resource Strain (CARDIA)    Difficulty of Paying Living Expenses: Somewhat hard  Food Insecurity: Food Insecurity Present (10/26/2022)   Hunger Vital Sign    Worried About Running Out of Food in the Last Year: Sometimes true    Ran Out of Food in the Last Year: Sometimes true  Transportation Needs: No Transportation Needs (10/26/2022)   PRAPARE - Administrator, Civil Service (Medical): No    Lack of Transportation (Non-Medical): No  Recent Concern: Transportation Needs - Unmet Transportation Needs (09/16/2022)   PRAPARE - Transportation    Lack of Transportation (Medical): No    Lack of Transportation (Non-Medical): Yes  Physical Activity: Sufficiently Active (10/26/2022)   Exercise Vital Sign    Days of Exercise per Week: 5 days    Minutes of Exercise per Session: 30 min  Stress: No Stress Concern Present (10/26/2022)   [employer] of Occupational Health - Occupational Stress Questionnaire    Feeling of Stress : Not at all   Social Connections: Moderately Isolated (10/26/2022)   Social Connection and Isolation Panel [NHANES]    Frequency of Communication with Friends and Family: Three times a week    Frequency of Social Gatherings with Friends and Family: Once a week    Attends Religious Services: Never    Database administrator or Organizations: Yes    Attends Engineer, structural: More than 4 times per year  Marital Status: Never married    Tobacco Counseling Counseling given: Not Answered Tobacco comments: quit in July 2005   Clinical Intake:  Pre-visit preparation completed: Yes  Pain : No/denies pain     Diabetes: No  How often do you need to have someone help you when you read instructions, pamphlets, or other written materials from your doctor or pharmacy?: 1 - Never  Diabetic?No   Interpreter Needed?: No  Information entered by :: Kandis Fantasia LPN   Activities of Daily Living    10/26/2022    9:21 PM 10/24/2022    6:24 PM  In your present state of health, do you have any difficulty performing the following activities:  Hearing? 0 0  Vision? 0 0  Difficulty concentrating or making decisions? 0 0  Walking or climbing stairs? 0 0  Dressing or bathing? 0 0  Doing errands, shopping? 0 0  Preparing Food and eating ? N N  Using the Toilet? N N  In the past six months, have you accidently leaked urine? N N  Do you have problems with loss of bowel control? N N  Managing your Medications? N N  Managing your Finances? N N  Housekeeping or managing your Housekeeping? N N    Patient Care Team: Hoy Register, MD as PCP - General (Family Medicine) Ladene Artist, MD as Consulting Physician (Oncology)  Indicate any recent Medical Services you may have received from other than [facility] providers in the past year (date may be approximate).     Assessment:   This is a routine wellness examination for Hermen.  Hearing/Vision screen Hearing Screening - Comments:: Denies  hearing difficulties   Vision Screening - Comments::  up to date with [facility]    Dietary issues and exercise activities discussed: Current Exercise Habits: Home exercise routine, Type of exercise: walking, Time (Minutes): 30, Frequency (Times/Week): 5, Weekly Exercise (Minutes/Week): 150, Intensity: Mild, Exercise limited by: orthopedic condition(s)   Goals Addressed   None   Depression Screen    10/26/2022    9:19 PM 09/20/2022   10:53 AM 06/02/2022   10:50 AM 03/21/2022    8:40 AM 11/30/2021   10:10 AM 10/21/2021   10:24 AM 09/16/2021   10:03 AM  PHQ 2/9 Scores  PHQ - 2 Score 0 0 0 0 1 0 0  PHQ- 9 Score  0 2    0    Fall Risk    10/26/2022    9:16 PM 10/24/2022    6:24 PM 06/02/2022   10:46 AM 03/21/2022    8:39 AM 11/30/2021   10:10 AM  Fall Risk   Falls in the past year? 0 0 0 0 0  Number falls in past yr: 0  0 0 0  Injury with Fall? 0  0 0 0  Risk for fall due to : No Fall Risks  No Fall Risks No Fall Risks   Follow up Falls prevention discussed;Education provided;Falls evaluation completed        FALL RISK PREVENTION PERTAINING TO THE HOME:  Any stairs in or around the home? No  If so, are there any without handrails? No  Home free of loose throw rugs in walkways, pet beds, electrical cords, etc? Yes  Adequate lighting in your home to reduce risk of falls? Yes   ASSISTIVE DEVICES UTILIZED TO PREVENT FALLS:  Life alert? No  Use of a cane, walker or w/c? Yes  Grab bars in the bathroom?  Yes  Shower chair or bench in shower? No  Elevated toilet seat or a handicapped toilet? Yes   TIMED UP AND GO:  Was the test performed? No . Telephonic visit   Cognitive Function:    11/27/2019    2:45 PM  MMSE - Mini Mental State Exam  Orientation to time 5  Orientation to Place 5  Registration 3  Attention/ Calculation 5  Recall 3  Language- name 2 objects 2  Language- repeat 1  Language- follow 3 step command 3  Language- read & follow  direction 1  Write a sentence 1  Copy design 1  Total score 30        10/26/2022    9:22 PM 09/20/2022   10:52 AM  6CIT Screen  What Year? 0 points 0 points  What month? 0 points 0 points  What time? 0 points 0 points  Count back from 20 0 points 0 points  Months in reverse 0 points 0 points  Repeat phrase 0 points 0 points  Total Score 0 points 0 points    Immunizations Immunization History  Administered Date(s) Administered   Influenza, High Dose Seasonal PF 03/15/2019   Influenza-Unspecified 03/06/2022   PFIZER(Purple Top)SARS-COV-2 Vaccination 07/18/2019, 08/08/2019, 03/07/2020   PNEUMOCOCCAL CONJUGATE-20 09/16/2021   PPD Test 02/25/2014   Pneumococcal Conjugate-13 05/20/2019, 11/27/2019   Tdap 11/27/2019   Zoster Recombinat (Shingrix) 05/20/2019    TDAP status: Up to date  Flu Vaccine status: Up to date  Pneumococcal vaccine status: Up to date  Covid-19 vaccine status: Information provided on how to obtain vaccines.   Qualifies for Shingles Vaccine? Yes   Zostavax completed No   Shingrix Completed?: Yes  Screening Tests Health Maintenance  Topic Date Due   Zoster Vaccines- Shingrix (2 of 2) 07/15/2019   COVID-19 Vaccine (4 - 2023-24 season) 02/11/2022   COLONOSCOPY (Pts 45-80yrs Insurance coverage will need to be confirmed)  11/03/2022   INFLUENZA VACCINE  01/12/2023   OPHTHALMOLOGY EXAM  04/16/2023   Medicare Annual Wellness (AWV)  10/26/2023   DTaP/Tdap/Td (2 - Td or Tdap) 11/26/2029   Pneumonia Vaccine 73+ Years old  Completed   Hepatitis C Screening  Completed   HPV VACCINES  Aged Out    Health Maintenance  Health Maintenance Due  Topic Date Due   Zoster Vaccines- Shingrix (2 of 2) 07/15/2019   COVID-19 Vaccine (4 - 2023-24 season) 02/11/2022    Colorectal cancer screening: Type of screening: Colonoscopy. Completed 11/02/20. Repeat every 2 years  Lung Cancer Screening: (Low Dose CT Chest recommended if Age 49-80 years, 30 pack-year currently  smoking OR have quit w/in 15years.) does not qualify.   Lung Cancer Screening Referral: n/a  Additional Screening:  Hepatitis C Screening: does qualify; Completed 11/17/20  Vision Screening: Recommended annual ophthalmology exams for early detection of glaucoma and other disorders of the eye. Is the patient up to date with their annual eye exam?  Yes  Who is the provider or what is the name of the office in which the patient attends annual eye exams? [facility] If pt is not established with a provider, would they like to be referred to a provider to establish care? No .   Dental Screening: Recommended annual dental exams for proper oral hygiene  Community Resource Referral / Chronic Care Management: CRR required this visit?  No   CCM required this visit?  No      Plan:     I have personally reviewed and noted the  following in the patient's chart:   Medical and social history Use of alcohol, tobacco or illicit drugs  Current medications and supplements including opioid prescriptions. Patient is not currently taking opioid prescriptions. Functional ability and status Nutritional status Physical activity Advanced directives List of other physicians Hospitalizations, surgeries, and ER visits in previous 12 months Vitals Screenings to include cognitive, depression, and falls Referrals and appointments  In addition, I have reviewed and discussed with patient certain preventive protocols, quality metrics, and best practice recommendations. A written personalized care plan for preventive services as well as general preventive health recommendations were provided to patient.     Durwin Nora, [place]   [id number]   Due to this being a virtual visit, the after visit summary with patients personalized plan was offered to patient via mail or my-chart. Patient would like to access on my-chart  Nurse Notes: Patient is asking for assistance in getting in home personal care services  for after his upcoming knee surgery.

## 2022-10-26 NOTE — Progress Notes (Signed)
Office Visit Note   Patient: Robert Rhodes           Date of Birth: 07-05-1945           MRN: [id number] Visit Date: 10/26/2022              Requested by: Hoy Register, MD [address] [place],  [place] [address] PCP: Hoy Register, MD   Assessment & Plan: Visit Diagnoses:  1. Primary osteoarthritis of left knee   2. Status post total right knee replacement     Plan: Impression is 77 year old gentleman 1 year status post left total knee replacement and advanced right knee DJD with varus deformity.  In regards to the left knee dental prophylaxis reinforced.  Activity as tolerated.  In regards to the right knee he has bone-on-bone varus deformity and he would like to move forward with a right total knee replacement soon as possible.  Again risk benefits prognosis reviewed with the patient.  Questions encouraged and answered.  Robert Rhodes will call the patient to confirm surgery time.  Follow-Up Instructions: No follow-ups on file.   Orders:  Orders Placed This Encounter  Procedures   XR KNEE 3 VIEW LEFT   No orders of the defined types were placed in this encounter.     Procedures: No procedures performed   Clinical Data: No additional findings.   Subjective: Chief Complaint  Patient presents with   Left Knee - Pain    Wants to discuss TKA    HPI Robert Rhodes a very pleasant 77 year old gentleman who is here to follow-up for both knees.  He is 1 year postop from a left total knee replacement.  He did very well and he is very pleased.  He has no pain.  His right knee is causing significant pain and disability with ADLs.  He would like to have this replaced soon as possible. Review of Systems  Constitutional: Negative.   HENT: Negative.    Eyes: Negative.   Respiratory: Negative.    Cardiovascular: Negative.   Gastrointestinal: Negative.   Endocrine: Negative.   Genitourinary: Negative.   Skin: Negative.   Allergic/Immunologic: Negative.    Neurological: Negative.   Hematological: Negative.   Psychiatric/Behavioral: Negative.    All other systems reviewed and are negative.    Objective: Vital Signs: Ht 5\' 6"  (1.676 m)   Wt 211 lb (95.7 kg)   BMI 34.06 kg/m   Physical Exam Vitals and nursing note reviewed.  Constitutional:      Appearance: He is well-developed.  Pulmonary:     Effort: Pulmonary effort is normal.  Abdominal:     Palpations: Abdomen is soft.  Skin:    General: Skin is warm.  Neurological:     Mental Status: He is alert and oriented to person, place, and time.  Psychiatric:        Behavior: Behavior normal.        Thought Content: Thought content normal.        Judgment: Judgment normal.     Ortho Exam Examination right knee shows antalgic gait.  Uses a cane for ambulation.  Varus deformity.  Medial joint line tenderness.  Pain and crepitus with range of motion.  Small effusion. Specialty Comments:  No specialty comments available.  Imaging: XR KNEE 3 VIEW LEFT  Result Date: 10/26/2022 Advanced tricompartmental degenerative joint disease.  Bone-on-bone joint space narrowing.  Varus deformity. Stable right total knee replacement without complication    PMFS History:  Patient Active Problem List   Diagnosis Date Noted   Primary osteoarthritis of left knee 10/26/2022   Schizoaffective disorder, bipolar type (HCC) 09/20/2022   Primary osteoarthritis of right knee 11/11/2021   Status post total right knee replacement 11/11/2021   AKI (acute kidney injury) (HCC) 04/21/2019   Pneumonia due to COVID-19 virus 04/21/2019   Osteoporosis    Ulcerative colitis, with rectal bleeding 08/06/2015   Gastroesophageal reflux disease without esophagitis 08/06/2015   Visual impairment of right eye 06/18/2015   Essential hypertension, benign 06/18/2015   Hernia of abdominal cavity 11/27/2014   Bipolar 1 disorder, mixed, moderate (HCC) 11/27/2014   Cellulitis 08/23/2014   Medically noncompliant  08/23/2014   Left leg cellulitis 08/23/2014   Cellulitis of left leg    Hypokalemia    Diarrhea 05/06/2014   Ulcerative colitis (HCC) 04/17/2014   Verrucous keratosis 04/17/2014   Nonspecific (abnormal) findings on radiological and other examination of gastrointestinal tract 07/18/2013   Acute esophagitis 07/18/2013   Hypovitaminosis D 05/06/2013   Ulcerative colitis, unspecified 04/04/2013   Knee pain, bilateral 03/05/2013   Past Medical History:  Diagnosis Date   Adenomatous colon polyp    Arthritis    bilateral knees-needs replacements   Claustrophobia    Colon cancer (HCC) 2015   COVID-19    Eye abnormalities    right eye seen in ED 02/17/2014 wearing eye patch using eye drops   GERD (gastroesophageal reflux disease)    with certain foods   Glaucoma    not on eye drops at this time (10/21/2020)   Hypertension    on meds   Irregular heartbeat    per pt/doesn't know what it does   Osteoarthritis    bilat knees  needs replacement   Osteoporosis    pt denies   Substance abuse (HCC)    stopped over 3 years ago   UC (ulcerative colitis) (HCC)    Vitamin D deficiency     Family History  Problem Relation Age of Onset   Cancer Mother        type unknown-in her leg   Diabetes Father    Heart disease Father    Heart disease Sister    Heart attack Brother    Healthy Sister    Colon cancer Neg Hx    Esophageal cancer Neg Hx    Rectal cancer Neg Hx    Stomach cancer Neg Hx    Colon polyps Neg Hx     Past Surgical History:  Procedure Laterality Date   COLON RESECTION N/A 08/23/2013   Procedure: Laparoscopic total abdominal colectomy and hernia repair;  Surgeon: Romie Levee, MD;  Location: [facility];  Service: General;  Laterality: N/A;   COLON SURGERY  08/2013   COLONOSCOPY  11/02/2020   EUS N/A 07/18/2013   Procedure: UPPER ENDOSCOPIC ULTRASOUND (EUS) RADIAL;  Surgeon: Rachael Fee, MD;  Location: [facility];  Service: Endoscopy;  Laterality: N/A;   EYE SURGERY      FLEXIBLE SIGMOIDOSCOPY N/A 2020   prep good   HERNIA REPAIR  2005   In [place]/put in a mesh   INCISIONAL HERNIA REPAIR N/A 02/20/2014   Procedure: LAP ASSISTED INCISIONAL HERNIA REPAIR LYSIS OF ADHESIONS;  Surgeon: Romie Levee, MD;  Location: [facility];  Service: General;  Laterality: N/A;  converted to open @ 0935   INCISIONAL HERNIA REPAIR N/A 02/20/2014   Procedure: HERNIA REPAIR INCISIONAL;  Surgeon: Romie Levee, MD;  Location: [facility];  Service: General;  Laterality:  N/A;  With MESH   INTRAOCULAR LENS INSERTION Bilateral    6 yrs ago   TOTAL KNEE ARTHROPLASTY Right 11/11/2021   Procedure: RIGHT TOTAL KNEE ARTHROPLASTY;  Surgeon: Tarry Kos, MD;  Location: [facility];  Service: Orthopedics;  Laterality: Right;   VENTRAL HERNIA REPAIR     Social History   Occupational History   Occupation: retired  Tobacco Use   Smoking status: Former    Packs/day: 0    Types: Cigarettes    Quit date: 06/14/2003    Years since quitting: 19.3   Smokeless tobacco: Never   Tobacco comments:    quit in July 2005  Vaping Use   Vaping Use: Never used  Substance and Sexual Activity   Alcohol use: No   Drug use: Not Currently    Types: 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

## 2022-10-26 NOTE — Progress Notes (Signed)
   10/26/2022 Name: Robert Rhodes MRN: [id number] DOB: 16-Mar-1946  No chief complaint on file.  Robert Rhodes is a 77 y.o. year old male who presented for a telephone visit.   They were referred to the pharmacist by a quality report for assistance in managing medication access. He is at risk of failing his adherence to cholesterol medication measure.   Patient is participating in a Managed Medicaid Plan:  no  Subjective:  Care Team: Primary Care Provider: Hoy Register, MD ; Next Scheduled Visit: this afternoon for  an AWV  Medication Access/Adherence  Current Pharmacy:  [facility] - [place] - [address] [place] [address] Phone: [phone] Fax: [phone]   Patient reports affordability concerns with their medications: No  Patient reports access/transportation concerns to their pharmacy: No  Patient reports adherence concerns with their medications:  No     Medication Management:  Current adherence strategy: fills 90-day supplies  Patient reports Good adherence to medications  Patient reports the following barriers to adherence: no known barriers at this time.   Recent fill dates: 09/20/2022 for a 90 day supply confirmed with his [employer].   Objective:  Lab Results  Component Value Date   HGBA1C 5.5 10/22/2019    Lab Results  Component Value Date   CREATININE 1.08 09/20/2022   BUN 6 (L) 09/20/2022   NA 136 09/20/2022   K 4.5 09/20/2022   CL 99 09/20/2022   CO2 25 09/20/2022    Lab Results  Component Value Date   CHOL 128 10/11/2021   HDL 44 10/11/2021   LDLCALC 68 10/11/2021   TRIG 81 10/11/2021   CHOLHDL 2.9 10/11/2021    Medications Reviewed Today     Reviewed by Hoy Register, MD (Physician) on 09/20/22 at 1258  Med List Status: <None>   Medication Order Taking? Sig Documenting Provider Last Dose Status Informant  amLODipine (NORVASC) 5 MG tablet  [id number]  TAKE 1 TABLET BY MOUTH ONCE DAILY- HYPERTENSION Newlin, Enobong, MD  Active   atorvastatin (LIPITOR) 20 MG tablet [id number]  Take 1 tablet (20 mg total) by mouth daily. Hoy Register, MD  Active   FIBER ADULT GUMMIES PO [id number] No Take 2 tablets by mouth daily.  Patient not taking: Reported on 09/20/2022   [provider] Not Taking Active Self              Assessment/Plan:   Medication Management: - Currently strategy sufficient to maintain appropriate adherence to prescribed medication regimen - Suggested use of weekly pill box to organize medications - Created list of medication, indication, and administration time. Provided to patient   Follow Up Plan: this afternoon for AWV.   Butch Penny, PharmD, Patsy Baltimore, CPP Clinical Pharmacist [facility] [phone]

## 2022-10-27 ENCOUNTER — Telehealth: Payer: Self-pay | Admitting: Internal Medicine

## 2022-10-27 NOTE — Telephone Encounter (Signed)
Good afternoon Dr. Rhea Belton,   We received a call from this patient wishing to schedule his Flex Sig, however patient is over the age of 17. Would you like to schedule an office visit first or schedule the patient directly in the [facility]?  Please advise on scheduling.   Thank you.

## 2022-10-31 NOTE — Telephone Encounter (Signed)
OV 1st please JMP

## 2022-11-01 NOTE — Telephone Encounter (Signed)
Patient has been scheduled for an OV.

## 2022-11-08 IMAGING — DX DG KNEE 1-2V PORT*R*
1 series · 3 of 3 positions shown · non-contrast
Comparison: Right knee radiographs 09/29/2021

CLINICAL DATA: Status post total right knee arthroplasty.

EXAM:
PORTABLE RIGHT KNEE - 1-2 VIEW

[Series 1: knee · 0.14mm/px · 3 of 3 slices shown]
[im 1/3]
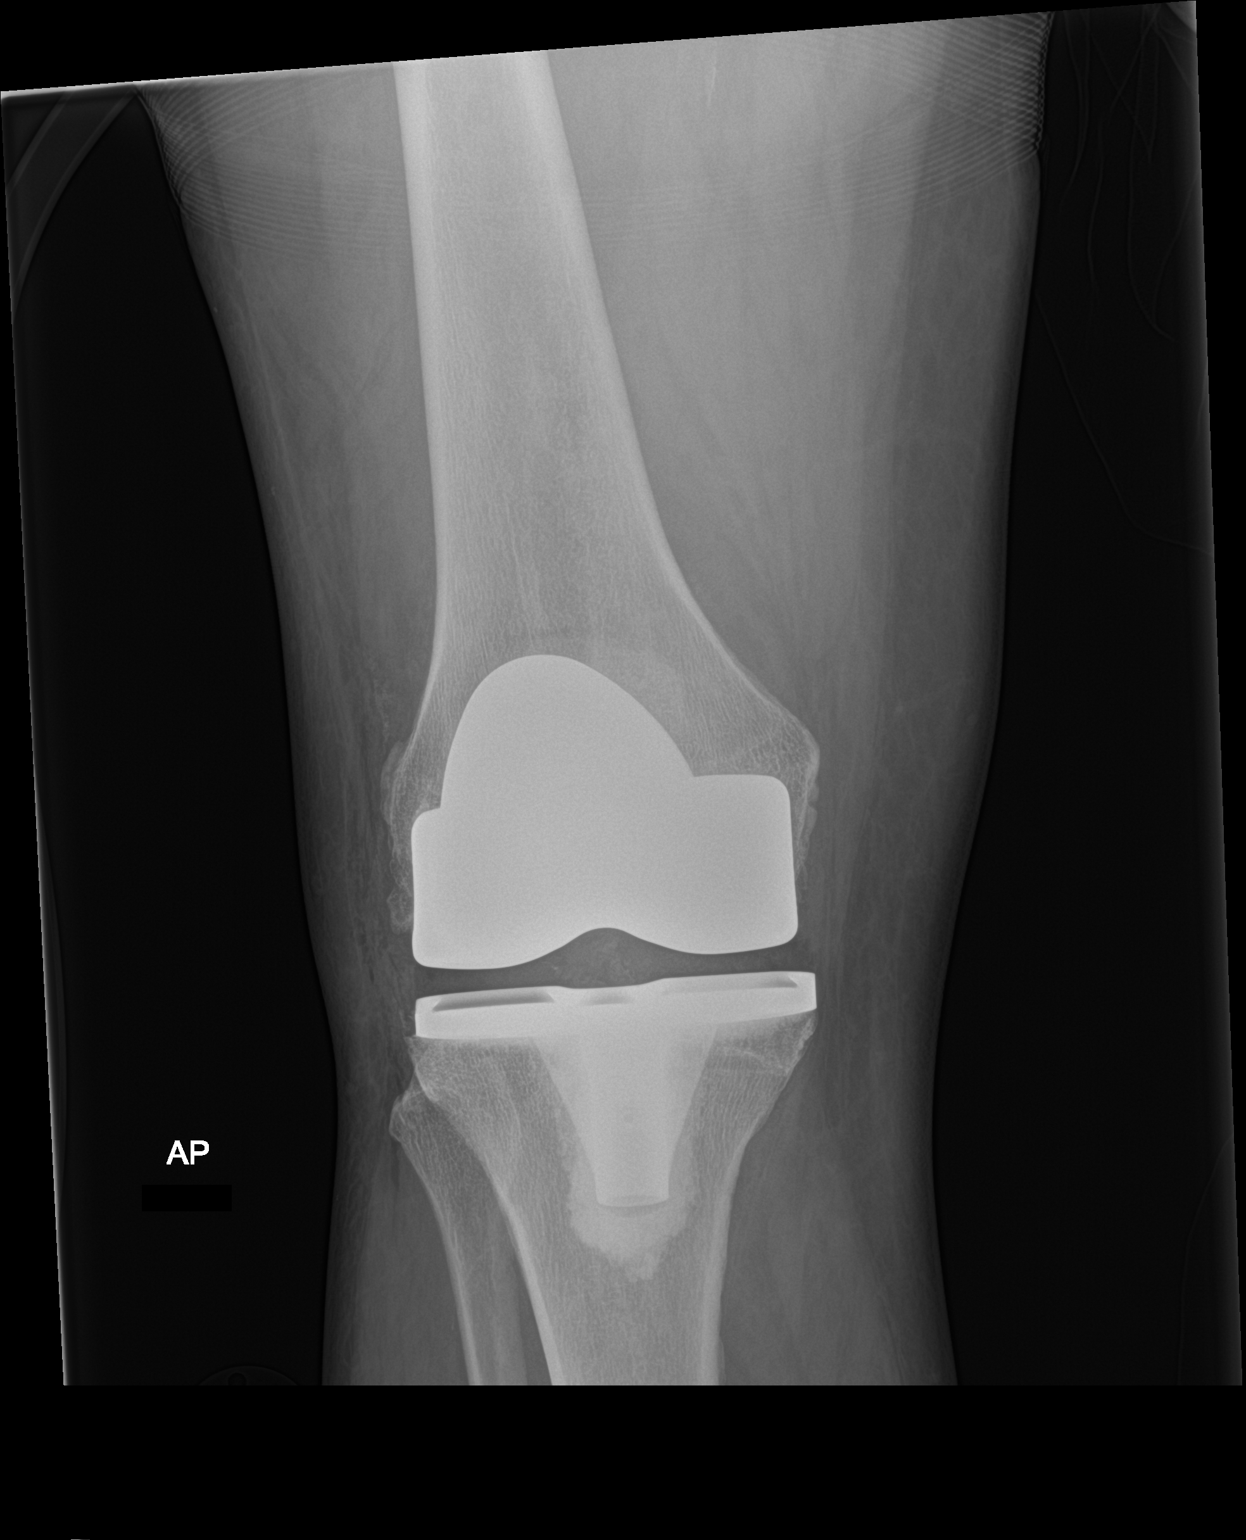
[im 2/3]
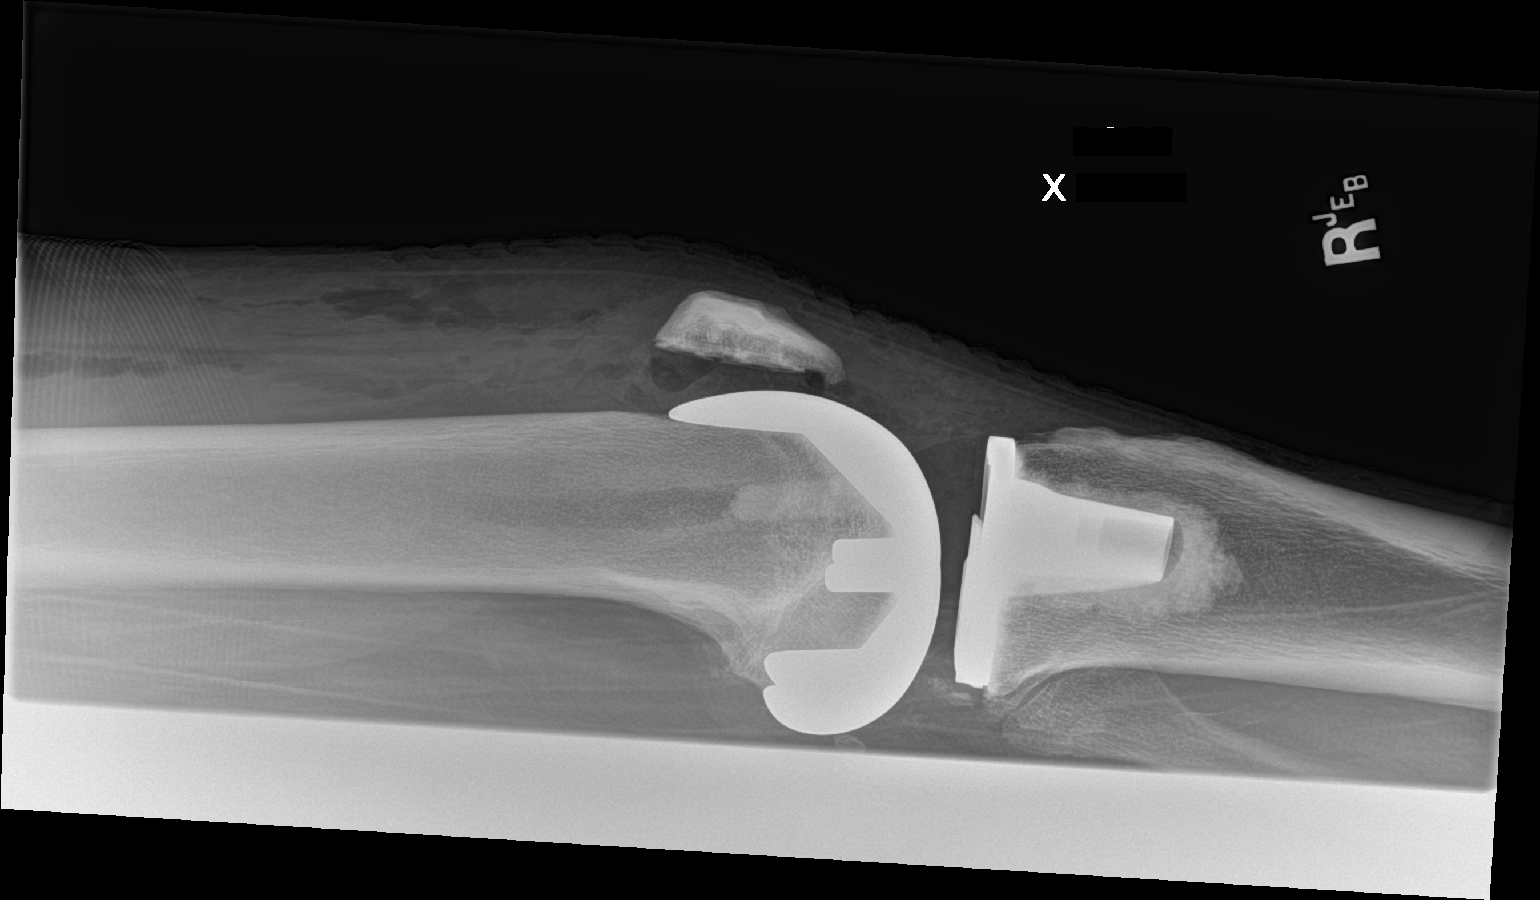
[im 3/3]
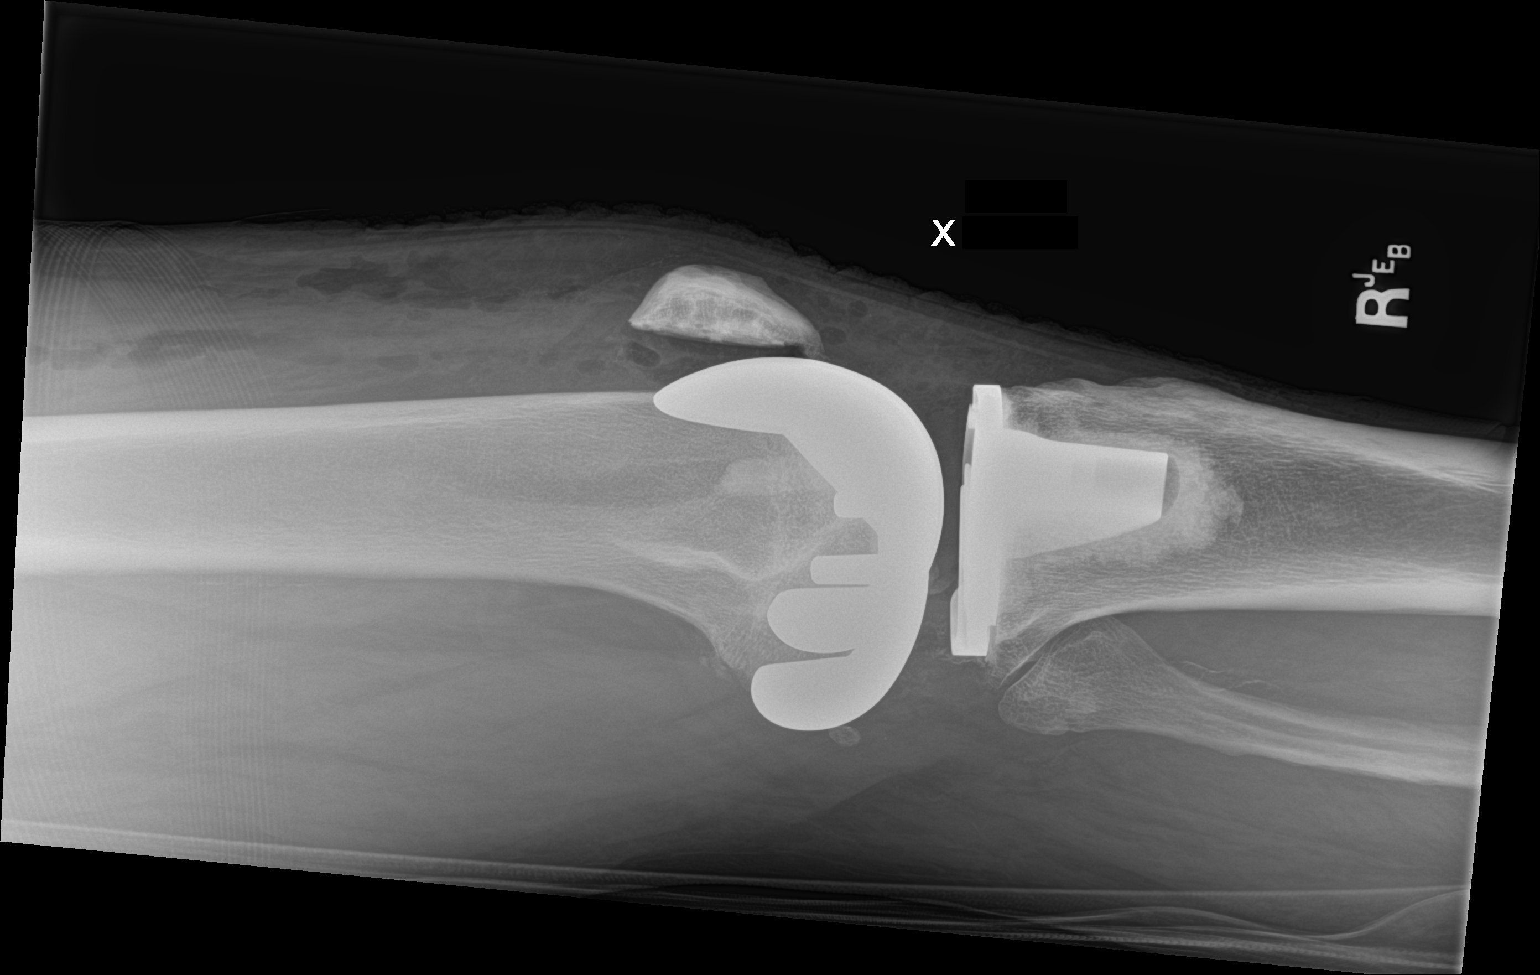

[3 of 3 positions shown; findings below may reference images not displayed]

FINDINGS: Interval total right knee arthroplasty. No perihardware lucency is
seen to indicate hardware failure or loosening. Expected
postoperative changes including small joint effusion anterior soft
tissue swelling, and subcutaneous air. No acute fracture or
dislocation.
IMPRESSION: Interval total right knee arthroplasty without evidence of hardware
failure.

## 2022-11-25 ENCOUNTER — Other Ambulatory Visit: Payer: Self-pay

## 2022-11-28 ENCOUNTER — Ambulatory Visit (INDEPENDENT_AMBULATORY_CARE_PROVIDER_SITE_OTHER): Payer: Medicare HMO | Admitting: Orthopaedic Surgery

## 2022-11-28 DIAGNOSIS — Z01818 Encounter for other preprocedural examination: Secondary | ICD-10-CM | POA: Diagnosis not present

## 2022-11-28 DIAGNOSIS — M1712 Unilateral primary osteoarthritis, left knee: Secondary | ICD-10-CM | POA: Diagnosis not present

## 2022-11-28 NOTE — Progress Notes (Signed)
Nurse visit

## 2022-11-29 ENCOUNTER — Telehealth: Payer: Self-pay | Admitting: Family Medicine

## 2022-11-29 LAB — HEMOGLOBIN A1C
Hgb A1c MFr Bld: 5.7 % of total Hgb — ABNORMAL HIGH (ref <5.7)
Mean Plasma Glucose: 117 mg/dL
eAG (mmol/L): 6.5 mmol/L

## 2022-11-29 LAB — PREALBUMIN: Prealbumin: 13 mg/dL — ABNORMAL LOW (ref 21–43)

## 2022-11-29 NOTE — Telephone Encounter (Addendum)
Home Health Verbal Orders - Caller/Agency: Patient Robert Rhodes [facility] Number: [phone] Requesting home health nurse aid Frequency: pt says only for 8 days 45 minutes a day. His surgery is 07/05/pt mentioned redefined staffing for the company   [phone]

## 2022-11-29 NOTE — Telephone Encounter (Signed)
Copied from CRM [id number]. Topic: General - Other >> Nov 29, 2022 10:31 AM Turkey B wrote: Reason for CRM: pt called in says his surgery date is 07/05. I put in a home health request for him today. He mentioned redifned staffing company

## 2022-11-30 NOTE — Telephone Encounter (Signed)
Noted  

## 2022-12-06 NOTE — Pre-Procedure Instructions (Signed)
Surgical Instructions    Your procedure is scheduled on December 16, 2022.  Report to [facility] Main Entrance "A" at 9:45 A.M., then check in with the Admitting office.  Call this number if you have problems the morning of surgery:  [phone]  If you have any questions prior to your surgery date call [phone]: Open Monday-Friday 8am-4pm If you experience any cold or flu symptoms such as cough, fever, chills, shortness of breath, etc. between now and your scheduled surgery, please notify us at the above number.     Remember:  Do not eat after midnight the night before your surgery  You may drink clear liquids until 9:15 AM the morning of your surgery.   Clear liquids allowed are: Water, Non-Citrus Juices (without pulp), Carbonated Beverages, Clear Tea, Black Coffee Only (NO MILK, CREAM OR POWDERED CREAMER of any kind), and Gatorade.  Patient Instructions  The night before surgery:  No food after midnight. ONLY clear liquids after midnight  The day of surgery (if you do NOT have diabetes):  Drink ONE (1) Pre-Surgery Clear Ensure by 9:15 AM the morning of surgery. Drink in one sitting. Do not sip.  This drink was given to you during your hospital  pre-op appointment visit.  Nothing else to drink after completing the  Pre-Surgery Clear Ensure.         If you have questions, please contact your surgeon's office.     Take these medicines the morning of surgery with A SIP OF WATER:  amLODipine (NORVASC)   atorvastatin (LIPITOR)   propranolol (INDERAL) - may take if needed   As of today, STOP taking any Aspirin (unless otherwise instructed by your surgeon) Aleve, Naproxen, Ibuprofen, Motrin, Advil, Goody's, BC's, all herbal medications, fish oil, and all vitamins.                     Do NOT Smoke (Tobacco/Vaping) for 24 hours prior to your procedure.  If you use a CPAP at night, you may bring your mask/headgear for your overnight stay.   Contacts, glasses, piercing's,  hearing aid's, dentures or partials may not be worn into surgery, please bring cases for these belongings.    For patients admitted to the hospital, discharge time will be determined by your treatment team.   Patients discharged the day of surgery will not be allowed to drive home, and someone needs to stay with them for 24 hours.  SURGICAL WAITING ROOM VISITATION Patients having surgery or a procedure may have no more than 2 support people in the waiting area - these visitors may rotate.   Children under the age of 71 must have an adult with them who is not the patient. If the patient needs to stay at the hospital during part of their recovery, the visitor guidelines for inpatient rooms apply. Pre-op nurse will coordinate an appropriate time for 1 support person to accompany patient in pre-op.  This support person may not rotate.   Please refer to the [facility] website for the visitor guidelines for Inpatients (after your surgery is over and you are in a regular room).   If you received a COVID test during your pre-op visit  it is requested that you wear a mask when out in public, stay away from anyone that may not be feeling well and notify your surgeon if you develop symptoms. If you have been in contact with anyone that has tested positive in the last 10 days please notify you surgeon.  Pre-operative 5 CHG Bath Instructions   You can play a key role in reducing the risk of infection after surgery. Your skin needs to be as free of germs as possible. You can reduce the number of germs on your skin by washing with CHG (chlorhexidine gluconate) soap before surgery. CHG is an antiseptic soap that kills germs and continues to kill germs even after washing.   DO NOT use if you have an allergy to chlorhexidine/CHG or antibacterial soaps. If your skin becomes reddened or irritated, stop using the CHG and notify one of our RNs at [phone].   Please shower with the CHG soap starting 4 days  before surgery using the following schedule:     Please keep in mind the following:  DO NOT shave, including legs and underarms, starting the day of your first shower.   You may shave your face at any point before/day of surgery.  Place clean sheets on your bed the day you start using CHG soap. Use a clean washcloth (not used since being washed) for each shower. DO NOT sleep with pets once you start using the CHG.   CHG Shower Instructions:  If you choose to wash your hair and private area, wash first with your normal shampoo/soap.  After you use shampoo/soap, rinse your hair and body thoroughly to remove shampoo/soap residue.  Turn the water OFF and apply about 3 tablespoons (45 ml) of CHG soap to a CLEAN washcloth.  Apply CHG soap ONLY FROM YOUR NECK DOWN TO YOUR TOES (washing for 3-5 minutes)  DO NOT use CHG soap on face, private areas, open wounds, or sores.  Pay special attention to the area where your surgery is being performed.  If you are having back surgery, having someone wash your back for you may be helpful. Wait 2 minutes after CHG soap is applied, then you may rinse off the CHG soap.  Pat dry with a clean towel  Put on clean clothes/pajamas   If you choose to wear lotion, please use ONLY the CHG-compatible lotions on the back of this paper.     Additional instructions for the day of surgery: DO NOT APPLY any lotions, deodorants, cologne, or perfumes.   Do not wear jewelry or makeup Do not wear nail polish, gel polish, artificial nails, or any other type of covering on natural nails (fingers and toes) Do not bring valuables to the hospital. [facility] is not responsible for any belongings or valuables. Put on clean/comfortable clothes.  Brush your teeth.  Ask your nurse before applying any prescription medications to the skin.      CHG Compatible Lotions   Aveeno Moisturizing lotion  Cetaphil Moisturizing Cream  Cetaphil Moisturizing Lotion  Clairol Herbal  Essence Moisturizing Lotion, Dry Skin  Clairol Herbal Essence Moisturizing Lotion, Extra Dry Skin  Clairol Herbal Essence Moisturizing Lotion, Normal Skin  Curel Age Defying Therapeutic Moisturizing Lotion with Alpha Hydroxy  Curel Extreme Care Body Lotion  Curel Soothing Hands Moisturizing Hand Lotion  Curel Therapeutic Moisturizing Cream, Fragrance-Free  Curel Therapeutic Moisturizing Lotion, Fragrance-Free  Curel Therapeutic Moisturizing Lotion, Original Formula  Eucerin Daily Replenishing Lotion  Eucerin Dry Skin Therapy Plus Alpha Hydroxy Crme  Eucerin Dry Skin Therapy Plus Alpha Hydroxy Lotion  Eucerin Original Crme  Eucerin Original Lotion  Eucerin Plus Crme Eucerin Plus Lotion  Eucerin TriLipid Replenishing Lotion  Keri Anti-Bacterial Hand Lotion  Keri Deep Conditioning Original Lotion Dry Skin Formula Softly Scented  Keri Deep Conditioning Original Lotion, Fragrance Free  Sensitive Skin Formula  Keri Lotion Fast Absorbing Fragrance Free Sensitive Skin Formula  Keri Lotion Fast Absorbing Softly Scented Dry Skin Formula  Keri Original Lotion  Keri Skin Renewal Lotion Keri Silky Smooth Lotion  Keri Silky Smooth Sensitive Skin Lotion  Nivea Body Creamy Conditioning Oil  Nivea Body Extra Enriched Lotion  Nivea Body Original Lotion  Nivea Body Sheer Moisturizing Lotion Nivea Crme  Nivea Skin Firming Lotion  NutraDerm 30 Skin Lotion  NutraDerm Skin Lotion  NutraDerm Therapeutic Skin Cream  NutraDerm Therapeutic Skin Lotion  ProShield Protective Hand Cream  Provon moisturizing lotion   Please read over the following fact sheets that you were given.

## 2022-12-07 ENCOUNTER — Other Ambulatory Visit: Payer: Self-pay

## 2022-12-07 ENCOUNTER — Encounter (HOSPITAL_COMMUNITY)
Admission: RE | Admit: 2022-12-07 | Discharge: 2022-12-07 | Disposition: A | Discharge status: Home / Self Care | Payer: Medicare HMO | Source: Ambulatory Visit | Attending: Orthopaedic Surgery | Admitting: Orthopaedic Surgery

## 2022-12-07 ENCOUNTER — Encounter (HOSPITAL_COMMUNITY): Payer: Self-pay

## 2022-12-07 VITALS — BP 144/76 | HR 89 | Temp 98.5°F | Resp 17 | Ht 69.0 in | Wt 202.0 lb

## 2022-12-07 DIAGNOSIS — Z01818 Encounter for other preprocedural examination: Secondary | ICD-10-CM | POA: Diagnosis not present

## 2022-12-07 DIAGNOSIS — M1712 Unilateral primary osteoarthritis, left knee: Secondary | ICD-10-CM | POA: Insufficient documentation

## 2022-12-07 HISTORY — DX: Cardiac arrhythmia, unspecified: I49.9

## 2022-12-07 LAB — BASIC METABOLIC PANEL
Anion gap: 8 (ref 5–15)
BUN: <5 mg/dL — ABNORMAL LOW (ref 8–23)
CO2: 28 mmol/L (ref 22–32)
Calcium: 9 mg/dL (ref 8.9–10.3)
Chloride: 101 mmol/L (ref 98–111)
Creatinine, Ser: 0.98 mg/dL (ref 0.61–1.24)
GFR, Estimated: >60 mL/min (ref >60)
Glucose, Bld: 82 mg/dL (ref 70–99)
Potassium: 3.7 mmol/L (ref 3.5–5.1)
Sodium: 137 mmol/L (ref 135–145)

## 2022-12-07 LAB — CBC
HCT: 39.5 % (ref 39.0–52.0)
Hemoglobin: 12.5 g/dL — ABNORMAL LOW (ref 13.0–17.0)
MCH: 26.3 pg (ref 26.0–34.0)
MCHC: 31.6 g/dL (ref 30.0–36.0)
MCV: 83 fL (ref 80.0–100.0)
Platelets: 468 10*3/uL — ABNORMAL HIGH (ref 150–400)
RBC: 4.76 MIL/uL (ref 4.22–5.81)
RDW: 15.4 % (ref 11.5–15.5)
WBC: 6.9 10*3/uL (ref 4.0–10.5)
nRBC: 0 % (ref 0.0–0.2)

## 2022-12-07 LAB — SURGICAL PCR SCREEN
MRSA, PCR: NEGATIVE
Staphylococcus aureus: NEGATIVE

## 2022-12-07 NOTE — Progress Notes (Signed)
PCP - Hoy Register Cardiologist - denies  PPM/ICD - denies Device Orders - n/a Rep Notified - n/a  Chest x-ray - denies EKG - 12/07/2022 Stress Test - denies ECHO - 10/14/2021 Cardiac Cath - denies  Sleep Study - denies CPAP -n/a   Fasting Blood Sugar - no DM Checks Blood Sugar _____ times a day  Last dose of GLP1 agonist-  n/a GLP1 instructions: n/a  Blood Thinner Instructions:n/a Aspirin Instructions:n/a  ERAS Protcol -yes PRE-SURGERY Ensure or G2- until 0915 AM  COVID TEST- n/a   Anesthesia review: no  Patient denies shortness of breath, fever, cough and chest pain at PAT appointment and the last 2 months   All instructions explained to the patient, with a verbal understanding of the material. Patient agrees to go over the instructions while at home for a better understanding. Patient also instructed to self quarantine after being tested for COVID-19. The opportunity to ask questions was provided.

## 2022-12-08 ENCOUNTER — Telehealth: Payer: Self-pay | Admitting: *Deleted

## 2022-12-08 NOTE — Telephone Encounter (Signed)
Ortho bundle pre-op call for Left TKA and 1 year call for Right TKA completed today.

## 2022-12-08 NOTE — Care Plan (Signed)
[facility] RNCM call to patient to discuss his upcoming Left total knee arthroplasty with Dr. Roda Shutters on 12/16/22. He is an Ortho bundle through [facility] and is agreeable to case management. He lives alone, but has a brother that will be assisting as needed. Also through [facility], patient has requested a HHA via [employer] staff[employer]. RNCM has called and left VM to see if this will be provided. HHPT will be needed after a short hospital stay. Referral made to [facility] after choice provided. He will be receiving a CPM from [employer] and already has a RW from his previous knee replacement surgery last year. Reviewed all post op care instructions. Will continue to follow for needs.

## 2022-12-12 ENCOUNTER — Other Ambulatory Visit: Payer: Self-pay | Admitting: Physician Assistant

## 2022-12-12 MED ORDER — ONDANSETRON HCL 4 MG PO TABS: 4.0000 mg | ORAL_TABLET | Freq: Three times a day (TID) | ORAL | 0 refills | Status: DC | PRN

## 2022-12-12 MED ORDER — DOCUSATE SODIUM 100 MG PO CAPS: 100.0000 mg | ORAL_CAPSULE | Freq: Every day | ORAL | 2 refills | Status: AC | PRN

## 2022-12-12 MED ORDER — OXYCODONE-ACETAMINOPHEN 5-325 MG PO TABS: 1.0000 | ORAL_TABLET | Freq: Three times a day (TID) | ORAL | 0 refills | Status: DC | PRN

## 2022-12-12 MED ORDER — RIVAROXABAN 10 MG PO TABS: 10.0000 mg | ORAL_TABLET | Freq: Every day | ORAL | 0 refills | Status: DC

## 2022-12-12 MED ORDER — METHOCARBAMOL 750 MG PO TABS: 750.0000 mg | ORAL_TABLET | Freq: Two times a day (BID) | ORAL | 2 refills | Status: DC | PRN

## 2022-12-14 MED ORDER — TRANEXAMIC ACID 1000 MG/10ML IV SOLN
2000.0000 mg | INTRAVENOUS | Status: DC
  Filled 2022-12-14: qty 20

## 2022-12-16 ENCOUNTER — Encounter (HOSPITAL_COMMUNITY)
Admission: RE | Disposition: A | Discharge status: Home / Self Care | Payer: Self-pay | Source: Home / Self Care | Attending: Orthopaedic Surgery

## 2022-12-16 ENCOUNTER — Encounter (HOSPITAL_COMMUNITY): Payer: Self-pay | Admitting: Orthopaedic Surgery

## 2022-12-16 ENCOUNTER — Observation Stay (HOSPITAL_COMMUNITY): Admission: RE | Admit: 2022-12-16 | Payer: Medicare HMO | Source: Home / Self Care | Admitting: Orthopaedic Surgery

## 2022-12-16 ENCOUNTER — Observation Stay (HOSPITAL_COMMUNITY): Payer: Medicare HMO

## 2022-12-16 ENCOUNTER — Ambulatory Visit (HOSPITAL_COMMUNITY): Payer: Medicare HMO | Admitting: Anesthesiology

## 2022-12-16 ENCOUNTER — Other Ambulatory Visit: Payer: Self-pay

## 2022-12-16 DIAGNOSIS — Z96651 Presence of right artificial knee joint: Secondary | ICD-10-CM | POA: Diagnosis not present

## 2022-12-16 DIAGNOSIS — M1711 Unilateral primary osteoarthritis, right knee: Secondary | ICD-10-CM

## 2022-12-16 DIAGNOSIS — F419 Anxiety disorder, unspecified: Secondary | ICD-10-CM | POA: Diagnosis not present

## 2022-12-16 DIAGNOSIS — Z471 Aftercare following joint replacement surgery: Secondary | ICD-10-CM | POA: Diagnosis not present

## 2022-12-16 DIAGNOSIS — Z96652 Presence of left artificial knee joint: Secondary | ICD-10-CM | POA: Diagnosis not present

## 2022-12-16 DIAGNOSIS — Z79899 Other long term (current) drug therapy: Secondary | ICD-10-CM | POA: Diagnosis not present

## 2022-12-16 DIAGNOSIS — Z87891 Personal history of nicotine dependence: Secondary | ICD-10-CM | POA: Insufficient documentation

## 2022-12-16 DIAGNOSIS — M1712 Unilateral primary osteoarthritis, left knee: Principal | ICD-10-CM | POA: Diagnosis present

## 2022-12-16 DIAGNOSIS — I10 Essential (primary) hypertension: Secondary | ICD-10-CM | POA: Diagnosis not present

## 2022-12-16 DIAGNOSIS — Z85038 Personal history of other malignant neoplasm of large intestine: Secondary | ICD-10-CM | POA: Insufficient documentation

## 2022-12-16 DIAGNOSIS — Z8616 Personal history of COVID-19: Secondary | ICD-10-CM | POA: Diagnosis not present

## 2022-12-16 DIAGNOSIS — G8918 Other acute postprocedural pain: Secondary | ICD-10-CM | POA: Diagnosis not present

## 2022-12-16 DIAGNOSIS — R609 Edema, unspecified: Secondary | ICD-10-CM | POA: Diagnosis not present

## 2022-12-16 HISTORY — PX: TOTAL KNEE ARTHROPLASTY: SHX125

## 2022-12-16 HISTORY — PX: APPLICATION OF WOUND VAC: SHX5189

## 2022-12-16 SURGERY — ARTHROPLASTY, KNEE, TOTAL
Anesthesia: Spinal | Site: Knee | Laterality: Left

## 2022-12-16 MED ORDER — OXYCODONE HCL 5 MG PO TABS: 10.0000 mg | ORAL_TABLET | ORAL | Status: DC | PRN

## 2022-12-16 MED ORDER — PROPOFOL 500 MG/50ML IV EMUL
INTRAVENOUS | Status: DC | PRN
  Administered 2022-12-16: 150 ug/kg/min via INTRAVENOUS

## 2022-12-16 MED ORDER — PHENYLEPHRINE 80 MCG/ML (10ML) SYRINGE FOR IV PUSH (FOR BLOOD PRESSURE SUPPORT)
PREFILLED_SYRINGE | INTRAVENOUS | Status: DC | PRN
  Administered 2022-12-16 (×2): 80 ug via INTRAVENOUS
  Administered 2022-12-16: 160 ug via INTRAVENOUS
  Administered 2022-12-16: 80 ug via INTRAVENOUS
  Administered 2022-12-16: 160 ug via INTRAVENOUS
  Administered 2022-12-16: 80 ug via INTRAVENOUS
  Administered 2022-12-16 (×2): 160 ug via INTRAVENOUS
  Administered 2022-12-16 (×4): 80 ug via INTRAVENOUS

## 2022-12-16 MED ORDER — SODIUM CHLORIDE 0.9 % IV SOLN: INTRAVENOUS | Status: DC

## 2022-12-16 MED ORDER — HYDRALAZINE HCL 20 MG/ML IJ SOLN
5.0000 mg | INTRAMUSCULAR | Status: AC
  Administered 2022-12-16 (×2): 5 mg via INTRAVENOUS

## 2022-12-16 MED ORDER — BUPIVACAINE-MELOXICAM ER 400-12 MG/14ML IJ SOLN
INTRAMUSCULAR | Status: DC | PRN
  Administered 2022-12-16: 400 mg

## 2022-12-16 MED ORDER — CHLORHEXIDINE GLUCONATE 0.12 % MT SOLN
15.0000 mL | Freq: Once | OROMUCOSAL | Status: AC
  Administered 2022-12-16: 15 mL via OROMUCOSAL
  Filled 2022-12-16: qty 15

## 2022-12-16 MED ORDER — FENTANYL CITRATE (PF) 100 MCG/2ML IJ SOLN: 50.0000 ug | Freq: Once | INTRAMUSCULAR | Status: AC

## 2022-12-16 MED ORDER — BUPIVACAINE IN DEXTROSE 0.75-8.25 % IT SOLN
INTRATHECAL | Status: DC | PRN
  Administered 2022-12-16: 1.6 mL via INTRATHECAL

## 2022-12-16 MED ORDER — TRANEXAMIC ACID 1000 MG/10ML IV SOLN
INTRAVENOUS | Status: DC | PRN
  Administered 2022-12-16: 2000 mg via TOPICAL

## 2022-12-16 MED ORDER — DOCUSATE SODIUM 100 MG PO CAPS
100.0000 mg | ORAL_CAPSULE | Freq: Two times a day (BID) | ORAL | Status: DC
  Administered 2022-12-16 – 2022-12-17 (×2): 100 mg via ORAL
  Filled 2022-12-16 (×2): qty 1

## 2022-12-16 MED ORDER — LACTATED RINGERS IV SOLN: INTRAVENOUS | Status: DC

## 2022-12-16 MED ORDER — MENTHOL 3 MG MT LOZG: 1.0000 | LOZENGE | OROMUCOSAL | Status: DC | PRN

## 2022-12-16 MED ORDER — ACETAMINOPHEN 325 MG PO TABS: 325.0000 mg | ORAL_TABLET | Freq: Four times a day (QID) | ORAL | Status: DC | PRN

## 2022-12-16 MED ORDER — POVIDONE-IODINE 10 % EX SWAB
2.0000 | Freq: Once | CUTANEOUS | Status: AC
  Administered 2022-12-16: 2 via TOPICAL

## 2022-12-16 MED ORDER — ACETAMINOPHEN 500 MG PO TABS: 1000.0000 mg | ORAL_TABLET | Freq: Four times a day (QID) | ORAL | Status: DC

## 2022-12-16 MED ORDER — OXYCODONE HCL ER 10 MG PO T12A
10.0000 mg | EXTENDED_RELEASE_TABLET | Freq: Two times a day (BID) | ORAL | Status: DC
  Administered 2022-12-16 – 2022-12-17 (×2): 10 mg via ORAL
  Filled 2022-12-16 (×2): qty 1

## 2022-12-16 MED ORDER — SODIUM CHLORIDE 0.9 % IR SOLN
Status: DC | PRN
  Administered 2022-12-16: 1000 mL

## 2022-12-16 MED ORDER — PROPOFOL 10 MG/ML IV BOLUS
INTRAVENOUS | Status: DC | PRN
  Administered 2022-12-16: 20 mg via INTRAVENOUS

## 2022-12-16 MED ORDER — ONDANSETRON HCL 4 MG/2ML IJ SOLN
4.0000 mg | Freq: Four times a day (QID) | INTRAMUSCULAR | Status: DC | PRN
  Administered 2022-12-16: 4 mg via INTRAVENOUS
  Filled 2022-12-16: qty 2

## 2022-12-16 MED ORDER — OXYCODONE HCL 5 MG PO TABS: 5.0000 mg | ORAL_TABLET | Freq: Once | ORAL | Status: DC | PRN

## 2022-12-16 MED ORDER — PROPOFOL 10 MG/ML IV BOLUS
INTRAVENOUS | Status: AC
  Filled 2022-12-16: qty 20

## 2022-12-16 MED ORDER — BUPIVACAINE-MELOXICAM ER 400-12 MG/14ML IJ SOLN
INTRAMUSCULAR | Status: AC
  Filled 2022-12-16: qty 1

## 2022-12-16 MED ORDER — OXYCODONE HCL 5 MG/5ML PO SOLN: 5.0000 mg | Freq: Once | ORAL | Status: DC | PRN

## 2022-12-16 MED ORDER — METOCLOPRAMIDE HCL 5 MG PO TABS: 5.0000 mg | ORAL_TABLET | Freq: Three times a day (TID) | ORAL | Status: DC | PRN

## 2022-12-16 MED ORDER — AMLODIPINE BESYLATE 5 MG PO TABS
5.0000 mg | ORAL_TABLET | Freq: Every day | ORAL | Status: DC
  Administered 2022-12-17: 5 mg via ORAL
  Filled 2022-12-16: qty 1

## 2022-12-16 MED ORDER — METHOCARBAMOL 1000 MG/10ML IJ SOLN: 500.0000 mg | Freq: Four times a day (QID) | INTRAVENOUS | Status: DC | PRN

## 2022-12-16 MED ORDER — VANCOMYCIN HCL 1000 MG IV SOLR
INTRAVENOUS | Status: AC
  Filled 2022-12-16: qty 20

## 2022-12-16 MED ORDER — FENTANYL CITRATE (PF) 100 MCG/2ML IJ SOLN
INTRAMUSCULAR | Status: AC
  Administered 2022-12-16: 50 ug via INTRAVENOUS
  Filled 2022-12-16: qty 2

## 2022-12-16 MED ORDER — HYDROMORPHONE HCL 1 MG/ML IJ SOLN: 0.5000 mg | INTRAMUSCULAR | Status: DC | PRN

## 2022-12-16 MED ORDER — ONDANSETRON HCL 4 MG PO TABS: 4.0000 mg | ORAL_TABLET | Freq: Four times a day (QID) | ORAL | Status: DC | PRN

## 2022-12-16 MED ORDER — FERROUS SULFATE 325 (65 FE) MG PO TABS
325.0000 mg | ORAL_TABLET | Freq: Three times a day (TID) | ORAL | Status: DC
  Administered 2022-12-17: 325 mg via ORAL
  Filled 2022-12-16 (×2): qty 1

## 2022-12-16 MED ORDER — CEFAZOLIN SODIUM-DEXTROSE 2-4 GM/100ML-% IV SOLN
2.0000 g | Freq: Four times a day (QID) | INTRAVENOUS | Status: AC
  Administered 2022-12-16 – 2022-12-17 (×2): 2 g via INTRAVENOUS
  Filled 2022-12-16 (×2): qty 100

## 2022-12-16 MED ORDER — ROPIVACAINE HCL 7.5 MG/ML IJ SOLN
INTRAMUSCULAR | Status: DC | PRN
  Administered 2022-12-16: 20 mL via PERINEURAL

## 2022-12-16 MED ORDER — PHENOL 1.4 % MT LIQD: 1.0000 | OROMUCOSAL | Status: DC | PRN

## 2022-12-16 MED ORDER — VANCOMYCIN HCL 1000 MG IV SOLR
INTRAVENOUS | Status: DC | PRN
  Administered 2022-12-16: 1000 mg via TOPICAL

## 2022-12-16 MED ORDER — RIVAROXABAN 10 MG PO TABS
10.0000 mg | ORAL_TABLET | Freq: Every day | ORAL | Status: DC
  Administered 2022-12-17: 10 mg via ORAL
  Filled 2022-12-16: qty 1

## 2022-12-16 MED ORDER — MIDAZOLAM HCL 2 MG/2ML IJ SOLN
INTRAMUSCULAR | Status: AC
  Filled 2022-12-16: qty 2

## 2022-12-16 MED ORDER — ONDANSETRON HCL 4 MG/2ML IJ SOLN: 4.0000 mg | Freq: Once | INTRAMUSCULAR | Status: DC | PRN

## 2022-12-16 MED ORDER — PHENYLEPHRINE 80 MCG/ML (10ML) SYRINGE FOR IV PUSH (FOR BLOOD PRESSURE SUPPORT)
PREFILLED_SYRINGE | INTRAVENOUS | Status: AC
  Filled 2022-12-16: qty 10

## 2022-12-16 MED ORDER — PROPRANOLOL HCL 10 MG PO TABS
10.0000 mg | ORAL_TABLET | Freq: Every day | ORAL | Status: DC | PRN
  Administered 2022-12-17: 10 mg via ORAL
  Filled 2022-12-16 (×2): qty 1

## 2022-12-16 MED ORDER — PRONTOSAN WOUND IRRIGATION OPTIME
TOPICAL | Status: DC | PRN
  Administered 2022-12-16: 450 mL via TOPICAL

## 2022-12-16 MED ORDER — DEXAMETHASONE SODIUM PHOSPHATE 10 MG/ML IJ SOLN
10.0000 mg | Freq: Once | INTRAMUSCULAR | Status: AC
  Administered 2022-12-17: 10 mg via INTRAVENOUS
  Filled 2022-12-16: qty 1

## 2022-12-16 MED ORDER — HYDRALAZINE HCL 20 MG/ML IJ SOLN
INTRAMUSCULAR | Status: AC
  Filled 2022-12-16: qty 1

## 2022-12-16 MED ORDER — FENTANYL CITRATE (PF) 100 MCG/2ML IJ SOLN: 25.0000 ug | INTRAMUSCULAR | Status: DC | PRN

## 2022-12-16 MED ORDER — TRANEXAMIC ACID-NACL 1000-0.7 MG/100ML-% IV SOLN
1000.0000 mg | INTRAVENOUS | Status: AC
  Administered 2022-12-16: 1000 mg via INTRAVENOUS
  Filled 2022-12-16: qty 100

## 2022-12-16 MED ORDER — TRANEXAMIC ACID-NACL 1000-0.7 MG/100ML-% IV SOLN
1000.0000 mg | Freq: Once | INTRAVENOUS | Status: AC
  Administered 2022-12-16: 1000 mg via INTRAVENOUS
  Filled 2022-12-16: qty 100

## 2022-12-16 MED ORDER — METOCLOPRAMIDE HCL 5 MG/ML IJ SOLN: 5.0000 mg | Freq: Three times a day (TID) | INTRAMUSCULAR | Status: DC | PRN

## 2022-12-16 MED ORDER — ASPIRIN 81 MG PO CHEW: 81.0000 mg | CHEWABLE_TABLET | Freq: Once | ORAL | Status: DC

## 2022-12-16 MED ORDER — METHOCARBAMOL 500 MG PO TABS
500.0000 mg | ORAL_TABLET | Freq: Four times a day (QID) | ORAL | Status: DC | PRN
  Administered 2022-12-16 – 2022-12-17 (×2): 500 mg via ORAL
  Filled 2022-12-16 (×2): qty 1

## 2022-12-16 MED ORDER — FENTANYL CITRATE (PF) 250 MCG/5ML IJ SOLN
INTRAMUSCULAR | Status: AC
  Filled 2022-12-16: qty 5

## 2022-12-16 MED ORDER — ORAL CARE MOUTH RINSE: 15.0000 mL | Freq: Once | OROMUCOSAL | Status: AC

## 2022-12-16 MED ORDER — 0.9 % SODIUM CHLORIDE (POUR BTL) OPTIME
TOPICAL | Status: DC | PRN
  Administered 2022-12-16: 1000 mL

## 2022-12-16 MED ORDER — CEFAZOLIN SODIUM-DEXTROSE 2-4 GM/100ML-% IV SOLN
2.0000 g | INTRAVENOUS | Status: AC
  Administered 2022-12-16: 2 g via INTRAVENOUS
  Filled 2022-12-16: qty 100

## 2022-12-16 MED ORDER — OXYCODONE HCL 5 MG PO TABS
5.0000 mg | ORAL_TABLET | ORAL | Status: DC | PRN
  Administered 2022-12-17 (×2): 10 mg via ORAL
  Filled 2022-12-16 (×2): qty 2

## 2022-12-16 SURGICAL SUPPLY — 88 items
ADH SKN CLS APL DERMABOND .7 (GAUZE/BANDAGES/DRESSINGS) ×2
ALCOHOL 70% 16 OZ (MISCELLANEOUS) ×2 IMPLANT
BAG COUNTER SPONGE SURGICOUNT (BAG) IMPLANT
BAG DECANTER FOR FLEXI CONT (MISCELLANEOUS) ×2 IMPLANT
BAG SPNG CNTER NS LX DISP (BAG)
BANDAGE ESMARK 6X9 LF (GAUZE/BANDAGES/DRESSINGS) IMPLANT
BLADE SAG 18X100X1.27 (BLADE) ×2 IMPLANT
BLADE SAW SGTL 73X25 THK (BLADE) ×2 IMPLANT
BNDG CMPR 9X6 STRL LF SNTH (GAUZE/BANDAGES/DRESSINGS)
BNDG ESMARK 6X9 LF (GAUZE/BANDAGES/DRESSINGS)
BOWL SMART MIX CTS (DISPOSABLE) ×2 IMPLANT
BSPLAT TIB 5D G CMNT STM LT (Knees) ×2 IMPLANT
CANISTER WOUND CARE 500ML ATS (WOUND CARE) IMPLANT
CEMENT BONE REFOBACIN R1X40 US (Cement) IMPLANT
CLSR STERI-STRIP ANTIMIC 1/2X4 (GAUZE/BANDAGES/DRESSINGS) ×4 IMPLANT
COMP FEM CMT PERS SZ 11 LT (Joint) ×2 IMPLANT
COMPONENT FEM CMT PERS SZ11LT (Joint) IMPLANT
COOLER ICEMAN CLASSIC (MISCELLANEOUS) ×2 IMPLANT
COVER SURGICAL LIGHT HANDLE (MISCELLANEOUS) ×2 IMPLANT
CUFF TOURN SGL QUICK 34 (TOURNIQUET CUFF) ×2
CUFF TRNQT CYL 34X4.125X (TOURNIQUET CUFF) ×2 IMPLANT
DERMABOND ADVANCED .7 DNX12 (GAUZE/BANDAGES/DRESSINGS) ×2 IMPLANT
DRAPE EXTREMITY T 121X128X90 (DISPOSABLE) ×2 IMPLANT
DRAPE HALF SHEET 40X57 (DRAPES) ×2 IMPLANT
DRAPE INCISE IOBAN 66X45 STRL (DRAPES) ×2 IMPLANT
DRAPE ORTHO SPLIT 77X108 STRL (DRAPES)
DRAPE POUCH INSTRU U-SHP 10X18 (DRAPES) ×2 IMPLANT
DRAPE SURG ORHT 6 SPLT 77X108 (DRAPES) IMPLANT
DRAPE U-SHAPE 47X51 STRL (DRAPES) ×4 IMPLANT
DRESSING PEEL AND PLAC PRVNA20 (GAUZE/BANDAGES/DRESSINGS) IMPLANT
DRSG AQUACEL AG ADV 3.5X10 (GAUZE/BANDAGES/DRESSINGS) ×2 IMPLANT
DRSG PEEL AND PLACE PREVENA 20 (GAUZE/BANDAGES/DRESSINGS) ×2
DURAPREP 26ML APPLICATOR (WOUND CARE) ×6 IMPLANT
ELECT CAUTERY BLADE 6.4 (BLADE) ×2 IMPLANT
ELECT PENCIL ROCKER SW 15FT (MISCELLANEOUS) ×2 IMPLANT
ELECT REM PT RETURN 9FT ADLT (ELECTROSURGICAL) ×2
ELECTRODE REM PT RTRN 9FT ADLT (ELECTROSURGICAL) ×2 IMPLANT
GLOVE BIOGEL PI IND STRL 7.0 (GLOVE) ×4 IMPLANT
GLOVE BIOGEL PI IND STRL 7.5 (GLOVE) ×10 IMPLANT
GLOVE ECLIPSE 7.0 STRL STRAW (GLOVE) ×6 IMPLANT
GLOVE INDICATOR 7.0 STRL GRN (GLOVE) ×2 IMPLANT
GLOVE INDICATOR 7.5 STRL GRN (GLOVE) ×2 IMPLANT
GLOVE SURG SYN 7.5 E (GLOVE) ×4 IMPLANT
GLOVE SURG SYN 7.5 PF PI (GLOVE) ×4 IMPLANT
GLOVE SURG UNDER LTX SZ7.5 (GLOVE) ×4 IMPLANT
GLOVE SURG UNDER POLY LF SZ7 (GLOVE) ×4 IMPLANT
GOWN STRL REUS W/ TWL LRG LVL3 (GOWN DISPOSABLE) ×2 IMPLANT
GOWN STRL REUS W/TWL LRG LVL3 (GOWN DISPOSABLE) ×2
GOWN STRL SURGICAL XL XLNG (GOWN DISPOSABLE) ×2 IMPLANT
GOWN TOGA ZIPPER T7+ PEEL AWAY (MISCELLANEOUS) ×4 IMPLANT
HANDPIECE INTERPULSE COAX TIP (DISPOSABLE) ×2
HOOD PEEL AWAY T7 (MISCELLANEOUS) ×2 IMPLANT
INSERT TIBIA ARTIC SZ 8-11 13 (Joint) IMPLANT
KIT BASIN OR (CUSTOM PROCEDURE TRAY) ×2 IMPLANT
KIT DRSG PREVENA PLUS 7DAY 125 (MISCELLANEOUS) IMPLANT
KIT TURNOVER KIT B (KITS) ×2 IMPLANT
MANIFOLD NEPTUNE II (INSTRUMENTS) ×2 IMPLANT
MARKER SKIN DUAL TIP RULER LAB (MISCELLANEOUS) ×4 IMPLANT
NDL SPNL 18GX3.5 QUINCKE PK (NEEDLE) ×2 IMPLANT
NEEDLE SPNL 18GX3.5 QUINCKE PK (NEEDLE) ×2 IMPLANT
NS IRRIG 1000ML POUR BTL (IV SOLUTION) ×2 IMPLANT
PACK TOTAL JOINT (CUSTOM PROCEDURE TRAY) ×2 IMPLANT
PAD ARMBOARD 7.5X6 YLW CONV (MISCELLANEOUS) ×4 IMPLANT
PAD COLD SHLDR WRAP-ON (PAD) ×2 IMPLANT
PIN DRILL HDLS TROCAR 75 4PK (PIN) IMPLANT
SCREW FEMALE HEX FIX 25X2.5 (ORTHOPEDIC DISPOSABLE SUPPLIES) IMPLANT
SET HNDPC FAN SPRY TIP SCT (DISPOSABLE) ×2 IMPLANT
SOLUTION PRONTOSAN WOUND 350ML (IRRIGATION / IRRIGATOR) ×2 IMPLANT
STAPLER VISISTAT 35W (STAPLE) IMPLANT
STEM POLY PAT PLY 35M KNEE (Knees) IMPLANT
STEM TIBIA 5 DEG SZ G L KNEE (Knees) IMPLANT
SUCTION TUBE FRAZIER 10FR DISP (SUCTIONS) ×2 IMPLANT
SUT ETHILON 2 0 FS 18 (SUTURE) IMPLANT
SUT ETHILON 2 0 PSLX (SUTURE) IMPLANT
SUT MNCRL AB 3-0 PS2 27 (SUTURE) IMPLANT
SUT VIC AB 0 CT1 27 (SUTURE) ×4
SUT VIC AB 0 CT1 27XBRD ANBCTR (SUTURE) ×4 IMPLANT
SUT VIC AB 1 CTX 27 (SUTURE) ×6 IMPLANT
SUT VIC AB 2-0 CT1 27 (SUTURE) ×8
SUT VIC AB 2-0 CT1 TAPERPNT 27 (SUTURE) ×8 IMPLANT
SYR 50ML LL SCALE MARK (SYRINGE) ×4 IMPLANT
TIBIA STEM 5 DEG SZ G L KNEE (Knees) ×2 IMPLANT
TOWEL GREEN STERILE (TOWEL DISPOSABLE) ×2 IMPLANT
TOWEL GREEN STERILE FF (TOWEL DISPOSABLE) ×2 IMPLANT
TRAY CATH INTERMITTENT SS 16FR (CATHETERS) IMPLANT
TUBE SUCT ARGYLE STRL (TUBING) ×2 IMPLANT
UNDERPAD 30X36 HEAVY ABSORB (UNDERPADS AND DIAPERS) ×2 IMPLANT
YANKAUER SUCT BULB TIP NO VENT (SUCTIONS) ×4 IMPLANT

## 2022-12-16 NOTE — H&P (Signed)
PREOPERATIVE H&P  Chief Complaint: left knee osteoarthritis  HPI: Robert Rhodes is a 77 y.o. male who presents for surgical treatment of left knee osteoarthritis.  He denies any changes in medical history.  Past Medical History:  Diagnosis Date   Adenomatous colon polyp    Arthritis    bilateral knees-needs replacements   Claustrophobia    Colon cancer (HCC) 2015   COVID-19    Dysrhythmia    Eye abnormalities    right eye seen in ED 02/17/2014 wearing eye patch using eye drops   GERD (gastroesophageal reflux disease)    with certain foods   Glaucoma    not on eye drops at this time (10/21/2020)   Hypertension    on meds   Irregular heartbeat    per pt/doesn't know what it does   Osteoarthritis    bilat knees  needs replacement   Osteoporosis    pt denies   Substance abuse (HCC)    stopped over 3 years ago   UC (ulcerative colitis) (HCC)    Vitamin D deficiency    Past Surgical History:  Procedure Laterality Date   COLON RESECTION N/A 08/23/2013   Procedure: Laparoscopic total abdominal colectomy and hernia repair;  Surgeon: Romie Levee, MD;  Location: [facility];  Service: General;  Laterality: N/A;   COLON SURGERY  08/2013   COLONOSCOPY  11/02/2020   EUS N/A 07/18/2013   Procedure: UPPER ENDOSCOPIC ULTRASOUND (EUS) RADIAL;  Surgeon: Rachael Fee, MD;  Location: [facility];  Service: Endoscopy;  Laterality: N/A;   EYE SURGERY     FLEXIBLE SIGMOIDOSCOPY N/A 2020   prep good   HERNIA REPAIR  2005   In [place]/put in a mesh   INCISIONAL HERNIA REPAIR N/A 02/20/2014   Procedure: LAP ASSISTED INCISIONAL HERNIA REPAIR LYSIS OF ADHESIONS;  Surgeon: Romie Levee, MD;  Location: [facility];  Service: General;  Laterality: N/A;  converted to open @ 0935   INCISIONAL HERNIA REPAIR N/A 02/20/2014   Procedure: HERNIA REPAIR INCISIONAL;  Surgeon: Romie Levee, MD;  Location: [facility];  Service: General;  Laterality: N/A;  With MESH   INTRAOCULAR LENS INSERTION Bilateral    6 yrs  ago   TOTAL KNEE ARTHROPLASTY Right 11/11/2021   Procedure: RIGHT TOTAL KNEE ARTHROPLASTY;  Surgeon: Tarry Kos, MD;  Location: [facility];  Service: Orthopedics;  Laterality: Right;   VENTRAL HERNIA REPAIR     Social History   Socioeconomic History   Marital status: Single    Spouse name: Not on file   Number of children: 0   Years of education: Not on file   Highest education level: GED or equivalent  Occupational History   Occupation: retired  Tobacco Use   Smoking status: Former    Packs/day: 0    Types: Cigarettes    Quit date: 06/14/2003    Years since quitting: 19.5   Smokeless tobacco: Never   Tobacco comments:    quit in July 2005  Vaping Use   Vaping Use: Never used  Substance and Sexual Activity   Alcohol use: No   Drug use: Not Currently    Types: 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  Other Topics Concern   Not on file  Social History Narrative   Not on file   Social Determinants of Health   Financial Resource Strain: Medium Risk (10/26/2022)   Overall Financial Resource Strain (CARDIA)  Difficulty of Paying Living Expenses: Somewhat hard  Food Insecurity: Food Insecurity Present (10/26/2022)   Hunger Vital Sign    Worried About Running Out of Food in the Last Year: Sometimes true    Ran Out of Food in the Last Year: Sometimes true  Transportation Needs: No Transportation Needs (10/26/2022)   PRAPARE - Administrator, Civil Service (Medical): No    Lack of Transportation (Non-Medical): No  Recent Concern: Transportation Needs - Unmet Transportation Needs (09/16/2022)   PRAPARE - Transportation    Lack of Transportation (Medical): No    Lack of Transportation (Non-Medical): Yes  Physical Activity: Sufficiently Active (10/26/2022)   Exercise Vital Sign    Days of Exercise per Week: 5 days    Minutes of Exercise per Session: 30 min  Stress: No Stress Concern Present (10/26/2022)   [employer] of Occupational  Health - Occupational Stress Questionnaire    Feeling of Stress : Not at all  Social Connections: Moderately Isolated (10/26/2022)   Social Connection and Isolation Panel [NHANES]    Frequency of Communication with Friends and Family: Three times a week    Frequency of Social Gatherings with Friends and Family: Once a week    Attends Religious Services: Never    Database administrator or Organizations: Yes    Attends Engineer, structural: More than 4 times per year    Marital Status: Never married   Family History  Problem Relation Age of Onset   Cancer Mother        type unknown-in her leg   Diabetes Father    Heart disease Father    Heart disease Sister    Heart attack Brother    Healthy Sister    Colon cancer Neg Hx    Esophageal cancer Neg Hx    Rectal cancer Neg Hx    Stomach cancer Neg Hx    Colon polyps Neg Hx    Allergies  Allergen Reactions   Ace Inhibitors Other (See Comments)    Unknown reaction- "affecting my breathing in some kind of way"   Advil [Ibuprofen] Other (See Comments)    Muscle tightness   Aspirin Nausea And Vomiting   Tylenol [Acetaminophen] Other (See Comments)    Muscle tightness   Prior to Admission medications   Medication Sig Start Date End Date Taking? Authorizing Provider  amLODipine (NORVASC) 5 MG tablet TAKE 1 TABLET BY MOUTH ONCE DAILY- HYPERTENSION 09/20/22  Yes Hoy Register, MD  atorvastatin (LIPITOR) 20 MG tablet Take 1 tablet (20 mg total) by mouth daily. 09/20/22  Yes Hoy Register, MD  docusate sodium (COLACE) 100 MG capsule Take 1 capsule (100 mg total) by mouth daily as needed. 12/12/22 12/12/23  Cristie Hem, PA-C  Homeopathic Products (ARNICA EX) Apply 1 Application topically daily as needed (bruising).   Yes [provider]  MAGNESIUM PO Take 1 tablet by mouth daily.   Yes [provider]  methocarbamol (ROBAXIN-750) 750 MG tablet Take 1 tablet (750 mg total) by mouth 2 (two) times daily as needed for  muscle spasms. 12/12/22   Cristie Hem, PA-C  ondansetron (ZOFRAN) 4 MG tablet Take 1 tablet (4 mg total) by mouth every 8 (eight) hours as needed for nausea or vomiting. 12/12/22   Cristie Hem, PA-C  oxyCODONE-acetaminophen (PERCOCET) 5-325 MG tablet Take 1-2 tablets by mouth 3 (three) times daily as needed. 12/12/22   Cristie Hem, PA-C  POTASSIUM PO Take 1 tablet by  mouth daily.   Yes [provider]  propranolol (INDERAL) 10 MG tablet Take 10 mg by mouth daily as needed (claustrophobia).   Yes [provider]  rivaroxaban (XARELTO) 10 MG TABS tablet Take 1 tablet (10 mg total) by mouth daily. To be taken after surgery to prevent blood clots 12/12/22   Cristie Hem, PA-C  VITAMIN D PO Take 1 tablet by mouth 2 (two) times a week.   Yes [provider]     Positive ROS: All other systems have been reviewed and were otherwise negative with the exception of those mentioned in the HPI and as above.  Physical Exam: General: Alert, no acute distress Cardiovascular: No pedal edema Respiratory: No cyanosis, no use of accessory musculature GI: abdomen soft Skin: No lesions in the area of chief complaint Neurologic: Sensation intact distally Psychiatric: Patient is competent for consent with normal mood and affect Lymphatic: no lymphedema  MUSCULOSKELETAL: exam stable  Assessment: left knee osteoarthritis  Plan: Plan for Procedure(s): LEFT TOTAL KNEE ARTHROPLASTY  The risks benefits and alternatives were discussed with the patient including but not limited to the risks of nonoperative treatment, versus surgical intervention including infection, bleeding, nerve injury,  blood clots, cardiopulmonary complications, morbidity, mortality, among others, and they were willing to proceed.   Glee Arvin, MD 12/16/2022 9:43 AM

## 2022-12-16 NOTE — Discharge Instructions (Signed)

## 2022-12-16 NOTE — Anesthesia Postprocedure Evaluation (Signed)
Anesthesia Post Note  Patient: Robert Rhodes  Procedure(s) Performed: LEFT TOTAL KNEE ARTHROPLASTY (Left: Knee) APPLICATION OF WOUND VAC     Patient location during evaluation: PACU Anesthesia Type: Spinal Level of consciousness: awake Pain management: pain level controlled Vital Signs Assessment: post-procedure vital signs reviewed and stable Respiratory status: spontaneous breathing, respiratory function stable and nonlabored ventilation Cardiovascular status: blood pressure returned to baseline and stable Postop Assessment: no headache, no backache and no apparent nausea or vomiting Anesthetic complications: no   No notable events documented.  Last Vitals:  Vitals:   12/16/22 1445 12/16/22 1515  BP: (!) 152/71 (!) 160/86  Pulse: 60 (!) 56  Resp: 14 10  Temp:  (!) 36.4 C  SpO2: 100% 99%    Last Pain:  Vitals:   12/16/22 1515  TempSrc:   PainSc: Asleep                 Linton Rump

## 2022-12-16 NOTE — Progress Notes (Signed)
Pt here from PACU; Alert & oriented x4; IV fluids started ;had a small amount of bowel movement;no wounds on sacral area/buttocks noted.

## 2022-12-16 NOTE — Anesthesia Procedure Notes (Signed)
Procedure Name: MAC Date/Time: 12/16/2022 11:40 AM  Performed by: De Nurse, CRNAPre-anesthesia Checklist: Patient identified, Emergency Drugs available, Suction available, Patient being monitored and Timeout performed Patient Re-evaluated:Patient Re-evaluated prior to induction Oxygen Delivery Method: Simple face mask

## 2022-12-16 NOTE — Anesthesia Preprocedure Evaluation (Addendum)
Anesthesia Evaluation  Patient identified by MRN, date of birth, ID band Patient awake    Reviewed: Allergy & Precautions, NPO status , Patient's Chart, lab work & pertinent test results, reviewed documented beta blocker date and time   History of Anesthesia Complications Negative for: history of anesthetic complications  Airway Mallampati: II  TM Distance: >3 FB Neck ROM: Full    Dental  (+) Edentulous Upper, Edentulous Lower   Pulmonary former smoker   Pulmonary exam normal        Cardiovascular hypertension, Pt. on medications and Pt. on home beta blockers Normal cardiovascular exam   '23 TTE - EF 55 to 60%. Grade I diastolic dysfunction (impaired relaxation). No valvular problems     Neuro/Psych  PSYCHIATRIC DISORDERS Anxiety  Bipolar Disorder    Schizoaffective d/o negative neurological ROS     GI/Hepatic PUD,GERD  Controlled,,(+)     substance abuse   UC Colon cancer    Endo/Other   Obesity   Renal/GU negative Renal ROS     Musculoskeletal  (+) Arthritis ,    Abdominal   Peds  Hematology  (+) Blood dyscrasia, anemia   Anesthesia Other Findings   Reproductive/Obstetrics                             Anesthesia Physical Anesthesia Plan  ASA: 3  Anesthesia Plan: Spinal   Post-op Pain Management: Regional block*   Induction:   PONV Risk Score and Plan: 1 and Treatment may vary due to age or medical condition and Propofol infusion  Airway Management Planned: Natural Airway and Simple Face Mask  Additional Equipment: None  Intra-op Plan:   Post-operative Plan:   Informed Consent: I have reviewed the patients History and Physical, chart, labs and discussed the procedure including the risks, benefits and alternatives for the proposed anesthesia with the patient or authorized representative who has indicated his/her understanding and acceptance.       Plan  Discussed with: CRNA and Anesthesiologist  Anesthesia Plan Comments: (Labs reviewed, platelets acceptable. Discussed risks and benefits of spinal, including spinal/epidural hematoma, infection, failed block, and PDPH. Patient expressed understanding and wished to proceed. )        Anesthesia Quick Evaluation

## 2022-12-16 NOTE — Transfer of Care (Signed)
Immediate Anesthesia Transfer of Care Note  Patient: Robert Rhodes  Procedure(s) Performed: LEFT TOTAL KNEE ARTHROPLASTY (Left: Knee) APPLICATION OF WOUND VAC  Patient Location: PACU  Anesthesia Type:MAC and Spinal  Level of Consciousness: awake, alert , and oriented  Airway & Oxygen Therapy: Patient Spontanous Breathing  Post-op Assessment: Report given to RN  Post vital signs: Reviewed and stable  Last Vitals:  Vitals Value Taken Time  BP 108/61 12/16/22 1355  Temp    Pulse 63 12/16/22 1356  Resp 19 12/16/22 1356  SpO2 97 % 12/16/22 1356  Vitals shown include unvalidated device data.  Last Pain:  Vitals:   12/16/22 1055  TempSrc:   PainSc: 0-No pain         Complications: No notable events documented.

## 2022-12-16 NOTE — Anesthesia Procedure Notes (Addendum)
Spinal  Patient location during procedure: OR Start time: 12/16/2022 11:37 AM End time: 12/16/2022 11:40 AM Reason for block: surgical anesthesia Staffing Performed: anesthesiologist  Anesthesiologist: Beryle Lathe, MD Performed by: Beryle Lathe, MD Authorized by: Beryle Lathe, MD   Preanesthetic Checklist Completed: patient identified, IV checked, risks and benefits discussed, surgical consent, monitors and equipment checked, pre-op evaluation and timeout performed Spinal Block Patient position: sitting Prep: DuraPrep Patient monitoring: heart rate, cardiac monitor, continuous pulse ox and blood pressure Approach: midline Location: L3-4 Injection technique: single-shot Needle Needle type: Pencan  Needle gauge: 24 G Additional Notes Consent was obtained prior to the procedure with all questions answered and concerns addressed. Risks including, but not limited to, bleeding, infection, nerve damage, paralysis, failed block, inadequate analgesia, allergic reaction, high spinal, itching, and headache were discussed and the patient wished to proceed. Functioning IV was confirmed and monitors were applied. Sterile prep and drape, including hand hygiene, mask, and sterile gloves were used. The patient was positioned and the spine was prepped. The skin was anesthetized with lidocaine. Free flow of clear CSF was obtained prior to injecting local anesthetic into the CSF. The spinal needle aspirated freely following injection. The needle was carefully withdrawn. The patient tolerated the procedure well.   Leslye Peer, MD

## 2022-12-16 NOTE — Anesthesia Procedure Notes (Signed)
Anesthesia Regional Block: Adductor canal block   Pre-Anesthetic Checklist: , timeout performed,  Correct Patient, Correct Site, Correct Laterality,  Correct Procedure, Correct Position, site marked,  Risks and benefits discussed,  Surgical consent,  Pre-op evaluation,  At surgeon's request and post-op pain management  Laterality: Left  Prep: chloraprep       Needles:  Injection technique: Single-shot  Needle Type: Echogenic Needle     Needle Length: 10cm  Needle Gauge: 21     Additional Needles:   Narrative:  Start time: 12/16/2022 10:54 AM End time: 12/16/2022 10:57 AM Injection made incrementally with aspirations every 5 mL.  Performed by: Personally  Anesthesiologist: Beryle Lathe, MD  Additional Notes: No pain on injection. No increased resistance to injection. Injection made in 5cc increments. Good needle visualization. Patient tolerated the procedure well.

## 2022-12-16 NOTE — Op Note (Signed)
Total Knee Arthroplasty Procedure Note  Preoperative diagnosis: Left knee osteoarthritis  Postoperative diagnosis:same  Operative findings: Complete loss of joint space Mild flexion contracture Mild varus deformity  Operative procedure:  Left total knee arthroplasty. CPT [id number] Application of incisional VAC. CPT [phone]  Surgeon: N. Glee Arvin, MD  Assist: Hart Carwin, RNFA  Anesthesia: Spinal, regional, local  Tourniquet time: see anesthesia record  Implants used: Zimmer persona cemented Femur: CR 11  Tibia: G Patella: 35 mm Polyethylene: 13 mm, medial congruent  Indication: Robert Rhodes is a 77 y.o. year old male with a history of knee pain. Having failed conservative management, the patient elected to proceed with a total knee arthroplasty.  We have reviewed the risk and benefits of the surgery and they elected to proceed after voicing understanding.  Procedure:  After informed consent was obtained and understanding of the risk were voiced including but not limited to bleeding, infection, damage to surrounding structures including nerves and vessels, blood clots, leg length inequality and the failure to achieve desired results, the operative extremity was marked with verbal confirmation of the patient in the holding area.   The patient was then brought to the operating room and transported to the operating room table in the supine position.  A tourniquet was applied to the operative extremity around the upper thigh. The operative limb was then prepped and draped in the usual sterile fashion and preoperative antibiotics were administered.  A time out was performed prior to the start of surgery confirming the correct extremity, preoperative antibiotic administration, as well as team members, implants and instruments available for the case. Correct surgical site was also confirmed with preoperative radiographs. The limb was then elevated for exsanguination and the tourniquet  was inflated. A midline incision was made and a standard medial parapatellar approach was performed.  The infrapatellar fat pad was removed.  Suprapatellar synovium was removed to reveal the anterior distal femoral cortex.  A medial peel was performed to release the capsule of the medial tibial plateau.  The patella was then everted and was prepared and sized to a 35 mm.  A cover was placed on the patella for protection from retractors.  The knee was then brought into full flexion and we then turned our attention to the femur.  The cruciates were sacrificed.  Start site was drilled in the femur and the intramedullary distal femoral cutting guide was placed, set at 5 degrees valgus, taking 11 mm of distal resection. The distal cut was made. Osteophytes were then removed.  Next, the proximal tibial cutting guide was placed with appropriate slope, varus/valgus alignment and depth of resection. The proximal tibial cut was made taking 4 mm off the low side. Gap blocks were then used to assess the extension gap and alignment, and appropriate soft tissue releases were performed. Attention was turned back to the femur, which was sized using the sizing guide to a size 11. Appropriate rotation of the femoral component was determined using epicondylar axis, Whiteside's line, and assessing the flexion gap under ligament tension. The appropriate size 4-in-1 cutting block was placed and checked with an angel wing and cuts were made. Posterior femoral osteophytes and uncapped bone were then removed with the curved osteotome.  Trial components were placed, and stability was checked in full extension, mid-flexion, and deep flexion. Proper tibial rotation was determined and marked.  The patella tracked well with a small lateral release.  The femoral lugs were then drilled. Trial components were then removed  and tibial preparation performed.  The tibia was sized for a size G component.   The bony surfaces were irrigated with a  pulse lavage and then dried. Bone cement was vacuum mixed on the back table, and the final components sized above were cemented into place.  Antibiotic irrigation was placed in the knee joint and soft tissues while the cement cured.  After cement had finished curing, excess cement was removed. The stability of the construct was re-evaluated throughout a range of motion and found to be acceptable. The trial liner was removed, the knee was copiously irrigated, and the knee was re-evaluated for any excess bone debris. The real polyethylene liner, 13 mm thick, was inserted and checked to ensure the locking mechanism had engaged appropriately. The tourniquet was deflated and hemostasis was achieved. The wound was irrigated with normal saline.  One gram of vancomycin powder was placed in the surgical bed.  Topical 0.25% bupivacaine and meloxicam was placed in the joint for postoperative pain.  Capsular closure was performed with a #1 vicryl, subcutaneous fat closed with a 0 vicryl suture, then subcutaneous tissue closed with interrupted 2.0 vicryl suture. The skin was then closed with a 2.0 nylon and incisional VAC. A sterile dressing was applied.  The patient was awakened in the operating room and taken to recovery in stable condition. All sponge, needle, and instrument counts were correct at the end of the case.  Tessa Lerner was necessary for opening, closing, retracting, limb positioning and overall facilitation and completion of the surgery.  Position: supine  Complications: none.  Time Out: performed   Drains/Packing: none  Estimated blood loss: minimal  Returned to Recovery Room: in good condition.   Antibiotics: yes   Mechanical VTE (DVT) Prophylaxis: sequential compression devices, TED thigh-high  Chemical VTE (DVT) Prophylaxis: aspirin POD 0, xarelto POD 1  Fluid Replacement  Crystalloid: see anesthesia record Blood: none  FFP: none   Specimens Removed: 1 to pathology   Sponge and  Instrument Count Correct? yes   PACU: portable radiograph - knee AP and Lateral   Plan/RTC: Return in 2 weeks for wound check.   Weight Bearing/Load Lower Extremity: full   Implant Name Type Inv. Item Serial No. Manufacturer Lot No. LRB No. Used Action  CEMENT BONE REFOBACIN [id number] [place] - [id number] Cement CEMENT BONE REFOBACIN [id number] [place]  ZIMMER RECON(ORTH,TRAU,BIO,SG) [id number] Left 2 Implanted  COMP FEM CMT PERS SZ 11 LT - [id number] Joint COMP FEM CMT PERS SZ 11 LT  ZIMMER RECON(ORTH,TRAU,BIO,SG) [id number] Left 1 Implanted  BSPLAT TIB 5D G CMNT STM LT - [id number] Knees BSPLAT TIB 5D G CMNT STM LT  ZIMMER RECON(ORTH,TRAU,BIO,SG) [id number] Left 1 Implanted  STEM POLY PAT PLY 28M KNEE - [id number] Knees STEM POLY PAT PLY 28M KNEE  ZIMMER RECON(ORTH,TRAU,BIO,SG) [id number] Left 1 Implanted  INSERT TIBIA ARTIC SZ 8-11 13 - [id number] Joint INSERT TIBIA ARTIC SZ 8-11 13  ZIMMER RECON(ORTH,TRAU,BIO,SG) [id number] Left 1 Implanted    N. Glee Arvin, MD [facility] 1:30 PM

## 2022-12-17 ENCOUNTER — Encounter (HOSPITAL_COMMUNITY): Payer: Self-pay | Admitting: Orthopaedic Surgery

## 2022-12-17 DIAGNOSIS — Z79899 Other long term (current) drug therapy: Secondary | ICD-10-CM | POA: Diagnosis not present

## 2022-12-17 DIAGNOSIS — Z96651 Presence of right artificial knee joint: Secondary | ICD-10-CM | POA: Diagnosis not present

## 2022-12-17 DIAGNOSIS — I10 Essential (primary) hypertension: Secondary | ICD-10-CM | POA: Diagnosis not present

## 2022-12-17 DIAGNOSIS — Z8616 Personal history of COVID-19: Secondary | ICD-10-CM | POA: Diagnosis not present

## 2022-12-17 DIAGNOSIS — M1712 Unilateral primary osteoarthritis, left knee: Secondary | ICD-10-CM | POA: Diagnosis not present

## 2022-12-17 DIAGNOSIS — Z87891 Personal history of nicotine dependence: Secondary | ICD-10-CM | POA: Diagnosis not present

## 2022-12-17 DIAGNOSIS — Z85038 Personal history of other malignant neoplasm of large intestine: Secondary | ICD-10-CM | POA: Diagnosis not present

## 2022-12-17 LAB — CBC
HCT: 37.9 % — ABNORMAL LOW (ref 39.0–52.0)
Hemoglobin: 12.2 g/dL — ABNORMAL LOW (ref 13.0–17.0)
MCH: 26.8 pg (ref 26.0–34.0)
MCHC: 32.2 g/dL (ref 30.0–36.0)
MCV: 83.3 fL (ref 80.0–100.0)
Platelets: 428 10*3/uL — ABNORMAL HIGH (ref 150–400)
RBC: 4.55 MIL/uL (ref 4.22–5.81)
RDW: 15.5 % (ref 11.5–15.5)
WBC: 9.3 10*3/uL (ref 4.0–10.5)
nRBC: 0 % (ref 0.0–0.2)

## 2022-12-17 NOTE — Progress Notes (Addendum)
   Subjective:  Patient reports pain as mild.  No events.  Eager to go home.  Objective:   VITALS:   Vitals:   12/17/22 0421 12/17/22 0525 12/17/22 0600 12/17/22 0815  BP: (!) 120/59  (!) 119/58 (!) 118/58  Pulse: 66 70 68 63  Resp: 17   18  Temp:    98 F (36.7 C)  TempSrc:      SpO2: 98%   100%  Weight:      Height:        Sensation intact distally Intact pulses distally Dorsiflexion/Plantar flexion intact Incision: dressing C/D/I and no drainage VAC with good suction, no drainage in canister   Lab Results  Component Value Date   WBC 9.3 12/17/2022   HGB 12.2 (L) 12/17/2022   HCT 37.9 (L) 12/17/2022   MCV 83.3 12/17/2022   PLT 428 (H) 12/17/2022     Assessment/Plan:  1 Day Post-Op   - Expected postop acute blood loss anemia - Up with PT/OT - DVT ppx - SCDs, ambulation, xarelto to start today - WBAT operative extremity - Pain control - Discharge planning - home today - will change to portable home unit at discharge - f/u 1 week and 2 weeks - CM to assist with equipment  Robert Rhodes 12/17/2022, 9:19 AM

## 2022-12-17 NOTE — Progress Notes (Signed)
Pt discharged in stable condition; prescribed meds sent to pt.'s [facility] pharmacy since July 1,2024.Pt called in & verified that meds were available.

## 2022-12-17 NOTE — Progress Notes (Addendum)
Pt has been DC. Received referral to assist with [facility] PT/OT. Met with pt. Pt plans to return home with the support of his brother who lives nearby. He reports that a friend is going to transport him home. He has a RW and a cane from previous knee sx. He reports that someone from the surgeon's office informed him that they arranged the [facility], but he doesn't know the name of the agency. Contacted Katina with [facility] and Barbara Cower with [facility] and they report they didn't receive the referral. Informed pt that I was not able to find out if the referral was called prior to sx. He is ok using any HH agency that accepts his insurance. Discussed CMS Medicare.gov compare [facility] list. He chose [facility]. Contacted Denyse Amass with [facility] and he accepted the referral.

## 2022-12-17 NOTE — Evaluation (Signed)
Physical Therapy Evaluation Patient Details Name: Robert Rhodes MRN: [id number] DOB: 17-Sep-1945 Today's Date: 12/17/2022  History of Present Illness  The pt is a 77 yo male presenting 7/5 for L TKA due to L knee arthritis. PMH includes: HTN, glaucoma, bilateral knee osteoarthritis, substance abuse (>3 years ago), and R TKA in 2023.   Clinical Impression  Pt in bed upon arrival of PT, agreeable to evaluation at this time. Prior to admission the pt was independent with mobility, reports intermittent use of cane for long-distance ambulation. The pt has all needed support from family and neighbors as well as all needed DME. He was able to complete bed mobility and sit-stand transfers without assistance, does rely on RW and UE support to power up to standing. The pt demos good stability with hallway ambulation, and verbalized understanding regarding progressive walking program. He was educated in LE exercises for improved muscle activation surrounding L knee as well as for ROM improvement. The pt will continue to benefit from skilled PT to facilitate full return of strength and ROM in L knee, but at surgeon's discretion in regards to setting and timing. Pt safe to return home when medically cleared for d/c.      Assistance Recommended at Discharge Intermittent Supervision/Assistance  If plan is discharge home, recommend the following:  Can travel by private vehicle  Assist for transportation;Help with stairs or ramp for entrance        Equipment Recommendations None recommended by PT (pt has needed DME)  Recommendations for Other Services       Functional Status Assessment Patient has had a recent decline in their functional status and demonstrates the ability to make significant improvements in function in a reasonable and predictable amount of time.     Precautions / Restrictions Precautions Precautions: Fall Restrictions Weight Bearing Restrictions: Yes LLE Weight Bearing: Weight  bearing as tolerated      Mobility  Bed Mobility Overal bed mobility: Independent                  Transfers Overall transfer level: Needs assistance Equipment used: Rolling walker (2 wheels) Transfers: Sit to/from Stand Sit to Stand: Supervision           General transfer comment: supervision with BUE support    Ambulation/Gait Ambulation/Gait assistance: Min guard, Supervision Gait Distance (Feet): 150 Feet Assistive device: Rolling walker (2 wheels) Gait Pattern/deviations: Step-through pattern, Decreased dorsiflexion - left, Decreased weight shift to left, Knee flexed in stance - left Gait velocity: 0.42 m/s Gait velocity interpretation: <1.8 ft/sec, indicate of risk for recurrent falls   General Gait Details: slight knee flexion and decreased clearance on L, pt able to improve with cues and mobility    Balance Overall balance assessment: Mild deficits observed, not formally tested                                           Pertinent Vitals/Pain Pain Assessment Pain Assessment: 0-10 Pain Score: 5  Pain Location: incision Pain Descriptors / Indicators: Discomfort, Sore Pain Intervention(s): Limited activity within patient's tolerance, Monitored during session, Repositioned    Home Living Family/patient expects to be discharged to:: Private residence Living Arrangements: Alone Available Help at Discharge: Friend(s);Family;Available PRN/intermittently Type of Home: Apartment Home Access: Level entry       Home Layout: One level Home Equipment: Shower seat;Grab bars - tub/shower;Grab bars -  toilet;Hand held shower head;Rolling Walker (2 wheels);Cane - single point      Prior Function Prior Level of Function : Independent/Modified Independent;Driving             Mobility Comments: pt reports intermittent use of cane for long distance ADLs Comments: pt reports independence with IADLs, uses sock aide at baseline     Hand  Dominance   Dominant Hand: Right    Extremity/Trunk Assessment   Upper Extremity Assessment Upper Extremity Assessment: Overall WFL for tasks assessed    Lower Extremity Assessment Lower Extremity Assessment: LLE deficits/detail LLE Deficits / Details: limited knee ROM due to pain and swelling, reports sensation intact, grossly 3/5 to MMT LLE: Unable to fully assess due to pain LLE Sensation: WNL LLE Coordination: WNL    Cervical / Trunk Assessment Cervical / Trunk Assessment: Normal  Communication   Communication: No difficulties  Cognition Arousal/Alertness: Awake/alert Behavior During Therapy: WFL for tasks assessed/performed Overall Cognitive Status: Within Functional Limits for tasks assessed                                          General Comments General comments (skin integrity, edema, etc.): VSS on RA, pt education completed, ice machine applied    Exercises Total Joint Exercises Ankle Circles/Pumps: AROM, Both, 10 reps, Seated Quad Sets: AROM, Both, 10 reps, Seated Heel Slides: AROM, Left, 10 reps, Seated Straight Leg Raises: AROM, Left, 5 reps, Seated Long Arc Quad: AROM, Left, 5 reps, Seated Knee Flexion: AROM, Left, 10 reps, Seated Goniometric ROM: 15 deg - 97 deg   Assessment/Plan    PT Assessment Patient needs continued PT services  PT Problem List Decreased strength;Decreased range of motion;Decreased activity tolerance;Decreased balance;Decreased mobility;Pain       PT Treatment Interventions DME instruction;Gait training;Therapeutic activities;Therapeutic exercise    PT Goals (Current goals can be found in the Care Plan section)  Acute Rehab PT Goals Patient Stated Goal: return to working out at [facility] PT Goal Formulation: With patient Time For Goal Achievement: 12/31/22 Potential to Achieve Goals: Good    Frequency BID        AM-PAC PT "6 Clicks" Mobility  Outcome Measure Help needed turning from your back to your side  while in a flat bed without using bedrails?: None Help needed moving from lying on your back to sitting on the side of a flat bed without using bedrails?: None Help needed moving to and from a bed to a chair (including a wheelchair)?: A Little Help needed standing up from a chair using your arms (e.g., wheelchair or bedside chair)?: A Little Help needed to walk in hospital room?: A Little Help needed climbing 3-5 steps with a railing? : A Little 6 Click Score: 20    End of Session Equipment Utilized During Treatment: Gait belt Activity Tolerance: Patient tolerated treatment well Patient left: in chair;with call bell/phone within reach Nurse Communication: Mobility status PT Visit Diagnosis: Other abnormalities of gait and mobility (R26.89);Muscle weakness (generalized) (M62.81)    Time: 1610-9604 PT Time Calculation (min) (ACUTE ONLY): 55 min   Charges:   PT Evaluation $PT Eval Low Complexity: 1 Low PT Treatments $Gait Training: 8-22 mins $Therapeutic Exercise: 8-22 mins $Therapeutic Activity: 8-22 mins PT General Charges $$ ACUTE PT VISIT: 1 Visit         Vickki Muff, PT, DPT   Acute Rehabilitation Department Office [phone]  Secure Chat Communication Preferred  Ronnie Derby 12/17/2022, 9:39 AM

## 2022-12-17 NOTE — Evaluation (Signed)
Occupational Therapy Evaluation Patient Details Name: Robert Rhodes MRN: [id number] DOB: 1945/12/28 Today's Date: 12/17/2022   History of Present Illness The pt is a 77 yo male presenting 7/5 for L TKA due to L knee arthritis. PMH includes: HTN, glaucoma, bilateral knee osteoarthritis, substance abuse (>3 years ago), and R TKA in 2023.   Clinical Impression   Pt s/p above diagnosis. Pt doing well, in good spirits, close to baseline, not limited by L TKA. Pt lives at ILF, no stairs, uses cane only for long distances at baseline. Pt has all DME at home needed to remain mod I and safe. Pt currently mod I for ADLs, supervision for safety with ambulation using RW. Pt able to ambulate ~250 feet today, completed tub transfer supervision, instructed on use of sock aide, how to feed wound vac through pants safely for LB dressing. Pt has no acute OT needs, no need for follow up therapy.     Recommendations for follow up therapy are one component of a multi-disciplinary discharge planning process, led by the attending physician.  Recommendations may be updated based on patient status, additional functional criteria and insurance authorization.   Assistance Recommended at Discharge PRN  Patient can return home with the following A little help with walking and/or transfers;A little help with bathing/dressing/bathroom;Assist for transportation;Assistance with cooking/housework    Functional Status Assessment  Patient has had a recent decline in their functional status and demonstrates the ability to make significant improvements in function in a reasonable and predictable amount of time.  Equipment Recommendations  None recommended by OT    Recommendations for Other Services       Precautions / Restrictions Precautions Precautions: Fall Restrictions Weight Bearing Restrictions: Yes LLE Weight Bearing: Weight bearing as tolerated      Mobility Bed Mobility Overal bed mobility: Independent                   Transfers Overall transfer level: Needs assistance Equipment used: Rolling walker (2 wheels) Transfers: Sit to/from Stand Sit to Stand: Supervision                  Balance Overall balance assessment: Mild deficits observed, not formally tested                                         ADL either performed or assessed with clinical judgement   ADL Overall ADL's : Modified independent                                       General ADL Comments: supervision for mobility, mod I for ADLs, good safety awareness, ROM, takes his time, further instructed on use of sock aide for donning socks     Vision Baseline Vision/History: 1 Wears glasses Ability to See in Adequate Light: 0 Adequate Patient Visual Report: No change from baseline       Perception     Praxis      Pertinent Vitals/Pain Pain Assessment Pain Assessment: 0-10 Pain Score: 3  Pain Location: incision Pain Descriptors / Indicators: Discomfort, Sore Pain Intervention(s): Monitored during session     Hand Dominance Right   Extremity/Trunk Assessment Upper Extremity Assessment Upper Extremity Assessment: Overall WFL for tasks assessed   Lower Extremity Assessment Lower Extremity Assessment: Defer to PT  evaluation LLE Deficits / Details: limited knee ROM due to pain and swelling, reports sensation intact, grossly 3/5 to MMT LLE: Unable to fully assess due to pain LLE Sensation: WNL LLE Coordination: WNL   Cervical / Trunk Assessment Cervical / Trunk Assessment: Normal   Communication Communication Communication: No difficulties   Cognition Arousal/Alertness: Awake/alert Behavior During Therapy: WFL for tasks assessed/performed Overall Cognitive Status: Within Functional Limits for tasks assessed                                       General Comments  VSS on RA, pt education completed, ice machine applied    Exercises      Shoulder Instructions      Home Living Family/patient expects to be discharged to:: Private residence Living Arrangements: Alone Available Help at Discharge: Friend(s);Family;Available PRN/intermittently Type of Home: Apartment Home Access: Level entry     Home Layout: One level     Bathroom Shower/Tub: Chief Strategy Officer: Standard     Home Equipment: Shower seat;Grab bars - tub/shower;Grab bars - toilet;Hand held Programmer, systems (2 wheels);Cane - single point          Prior Functioning/Environment Prior Level of Function : Independent/Modified Independent;Driving             Mobility Comments: pt reports intermittent use of cane for long distance ADLs Comments: pt reports independence with IADLs, uses sock aide at baseline        OT Problem List: Decreased range of motion;Impaired balance (sitting and/or standing);Pain      OT Treatment/Interventions:      OT Goals(Current goals can be found in the care plan section) Acute Rehab OT Goals Patient Stated Goal: to return home OT Goal Formulation: With patient Time For Goal Achievement: 12/31/22 Potential to Achieve Goals: Good  OT Frequency:      Co-evaluation              AM-PAC OT "6 Clicks" Daily Activity     Outcome Measure Help from another person eating meals?: None Help from another person taking care of personal grooming?: None Help from another person toileting, which includes using toliet, bedpan, or urinal?: None Help from another person bathing (including washing, rinsing, drying)?: A Little Help from another person to put on and taking off regular upper body clothing?: None Help from another person to put on and taking off regular lower body clothing?: A Little 6 Click Score: 22   End of Session Equipment Utilized During Treatment: Gait belt;Rolling walker (2 wheels) Nurse Communication: Mobility status  Activity Tolerance: Patient tolerated treatment  well Patient left: in chair;with call bell/phone within reach  OT Visit Diagnosis: Other abnormalities of gait and mobility (R26.89);Muscle weakness (generalized) (M62.81);Pain Pain - Right/Left: Left Pain - part of body: Knee                Time: 8657-8469 OT Time Calculation (min): 34 min Charges:  OT General Charges $OT Visit: 1 Visit OT Evaluation $OT Eval Low Complexity: 1 Low OT Treatments $Self Care/Home Management : 8-22 mins  [place], OTR/L   Alexis Goodell 12/17/2022, 11:20 AM

## 2022-12-17 NOTE — Discharge Summary (Signed)
Patient ID: Robert Rhodes MRN: [id number] DOB/AGE: 79-Nov-1947 79 y.o.  Admit date: 12/16/2022 Discharge date: 12/17/2022  Admission Diagnoses:  Primary osteoarthritis of left knee  Discharge Diagnoses:  Principal Problem:   Primary osteoarthritis of left knee Active Problems:   Status post total left knee replacement   Past Medical History:  Diagnosis Date   Adenomatous colon polyp    Arthritis    bilateral knees-needs replacements   Claustrophobia    Colon cancer (HCC) 2015   COVID-19    Dysrhythmia    Eye abnormalities    right eye seen in ED 02/17/2014 wearing eye patch using eye drops   GERD (gastroesophageal reflux disease)    with certain foods   Glaucoma    not on eye drops at this time (10/21/2020)   Hypertension    on meds   Irregular heartbeat    per pt/doesn't know what it does   Osteoarthritis    bilat knees  needs replacement   Osteoporosis    pt denies   Substance abuse (HCC)    stopped over 3 years ago   UC (ulcerative colitis) (HCC)    Vitamin D deficiency     Surgeries: Procedure(s): LEFT TOTAL KNEE ARTHROPLASTY APPLICATION OF WOUND VAC on 12/16/2022   Consultants (if any):   Discharged Condition: Improved  Hospital Course: Robert Rhodes is an 77 y.o. male who was admitted 12/16/2022 with a diagnosis of Primary osteoarthritis of left knee and went to the operating room on 12/16/2022 and underwent the above named procedures.    He was given perioperative antibiotics:  Anti-infectives (From admission, onward)    Start     Dose/Rate Route Frequency Ordered Stop   12/16/22 1900  ceFAZolin (ANCEF) IVPB 2g/100 mL premix        2 g 200 mL/hr over 30 Minutes Intravenous Every 6 hours 12/16/22 1804 12/17/22 0146   12/16/22 1226  vancomycin (VANCOCIN) powder  Status:  Discontinued          As needed 12/16/22 1226 12/16/22 1350   12/16/22 0930  ceFAZolin (ANCEF) IVPB 2g/100 mL premix        2 g 200 mL/hr over 30 Minutes Intravenous On call to  O.R. 12/16/22 4098 12/16/22 1142     .  He was given sequential compression devices, early ambulation, and appropriate chemoprophylaxis for DVT prophylaxis.  He benefited maximally from the hospital stay and there were no complications.    Recent vital signs:  Vitals:   12/17/22 0600 12/17/22 0815  BP: (!) 119/58 (!) 118/58  Pulse: 68 63  Resp:  18  Temp:  98 F (36.7 C)  SpO2:  100%    Recent laboratory studies:  Lab Results  Component Value Date   HGB 12.2 (L) 12/17/2022   HGB 12.5 (L) 12/07/2022   HGB 13.2 09/20/2022   Lab Results  Component Value Date   WBC 9.3 12/17/2022   PLT 428 (H) 12/17/2022   Lab Results  Component Value Date   INR 0.9 04/19/2019   Lab Results  Component Value Date   NA 137 12/07/2022   K 3.7 12/07/2022   CL 101 12/07/2022   CO2 28 12/07/2022   BUN <5 (L) 12/07/2022   CREATININE 0.98 12/07/2022   GLUCOSE 82 12/07/2022    Discharge Medications:   Allergies as of 12/17/2022       Reactions   Ace Inhibitors Other (See Comments)   Unknown reaction- "affecting my breathing in some  kind of way"   Advil [ibuprofen] Other (See Comments)   Muscle tightness   Aspirin Nausea And Vomiting   Tylenol [acetaminophen] Other (See Comments)   Muscle tightness        Medication List     TAKE these medications    amLODipine 5 MG tablet Commonly known as: NORVASC TAKE 1 TABLET BY MOUTH ONCE DAILY- HYPERTENSION   ARNICA EX Apply 1 Application topically daily as needed (bruising).   atorvastatin 20 MG tablet Commonly known as: LIPITOR Take 1 tablet (20 mg total) by mouth daily.   docusate sodium 100 MG capsule Commonly known as: Colace Take 1 capsule (100 mg total) by mouth daily as needed.   MAGNESIUM PO Take 1 tablet by mouth daily.   methocarbamol 750 MG tablet Commonly known as: Robaxin-750 Take 1 tablet (750 mg total) by mouth 2 (two) times daily as needed for muscle spasms.   ondansetron 4 MG tablet Commonly known as:  Zofran Take 1 tablet (4 mg total) by mouth every 8 (eight) hours as needed for nausea or vomiting.   oxyCODONE-acetaminophen 5-325 MG tablet Commonly known as: Percocet Take 1-2 tablets by mouth 3 (three) times daily as needed.   POTASSIUM PO Take 1 tablet by mouth daily.   propranolol 10 MG tablet Commonly known as: INDERAL Take 10 mg by mouth daily as needed (claustrophobia).   rivaroxaban 10 MG Tabs tablet Commonly known as: XARELTO Take 1 tablet (10 mg total) by mouth daily. To be taken after surgery to prevent blood clots   VITAMIN D PO Take 1 tablet by mouth 2 (two) times a week.               Durable Medical Equipment  (From admission, onward)           Start     Ordered   12/16/22 1805  DME Walker rolling  Once       Question Answer Comment  Walker: With 5 Inch Wheels   Patient needs a walker to treat with the following condition Status post left partial knee replacement      12/16/22 1804   12/16/22 1805  DME 3 n 1  Once        12/16/22 1804   12/16/22 1805  DME Bedside commode  Once       Question:  Patient needs a bedside commode to treat with the following condition  Answer:  Status post left partial knee replacement   12/16/22 1804            Diagnostic Studies: DG Knee Left Port  Result Date: 12/16/2022 CLINICAL DATA:  Status post left knee arthroplasty EXAM: PORTABLE LEFT KNEE - 1-2 VIEW COMPARISON:  None Available. FINDINGS: Left knee arthroplasty in expected alignment. No periprosthetic lucency or fracture. There has been patellar resurfacing. Recent postsurgical change includes air and edema in the soft tissues and joint space. Presumed wound VAC in place. IMPRESSION: Left knee arthroplasty without immediate postoperative complication. Electronically Signed   By: Narda Rutherford M.D.   On: 12/16/2022 14:51    Disposition: Discharge disposition: 01-Home or Self Care       Discharge Instructions     Call MD / Call 911   Complete by:  As directed    If you experience chest pain or shortness of breath, CALL 911 and be transported to the hospital emergency room.  If you develope a fever above 101.5 F, pus (white drainage) or increased drainage or redness at  the wound, or calf pain, call your surgeon's office.   Constipation Prevention   Complete by: As directed    Drink plenty of fluids.  Prune juice may be helpful.  You may use a stool softener, such as Colace (over the counter) 100 mg twice a day.  Use MiraLax (over the counter) for constipation as needed.   Driving restrictions   Complete by: As directed    No driving while taking narcotic pain meds.   Increase activity slowly as tolerated   Complete by: As directed    Post-operative opioid taper instructions:   Complete by: As directed    POST-OPERATIVE OPIOID TAPER INSTRUCTIONS: It is important to wean off of your opioid medication as soon as possible. If you do not need pain medication after your surgery it is ok to stop day one. Opioids include: Codeine, Hydrocodone(Norco, Vicodin), Oxycodone(Percocet, oxycontin) and hydromorphone amongst others.  Long term and even short term use of opiods can cause: Increased pain response Dependence Constipation Depression Respiratory depression And more.  Withdrawal symptoms can include Flu like symptoms Nausea, vomiting And more Techniques to manage these symptoms Hydrate well Eat regular healthy meals Stay active Use relaxation techniques(deep breathing, meditating, yoga) Do Not substitute Alcohol to help with tapering If you have been on opioids for less than two weeks and do not have pain than it is ok to stop all together.  Plan to wean off of opioids This plan should start within one week post op of your joint replacement. Maintain the same interval or time between taking each dose and first decrease the dose.  Cut the total daily intake of opioids by one tablet each day Next start to increase the time between  doses. The last dose that should be eliminated is the evening dose.           Follow-up Information     Cristie Hem, PA-C. Schedule an appointment as soon as possible for a visit in 1 week(s).   Specialty: Orthopedic Surgery Why: For wound re-check Contact information: [address] [place] [address] [phone]                  Signed: Glee Arvin 12/17/2022, 9:22 AM

## 2022-12-19 ENCOUNTER — Telehealth: Payer: Self-pay

## 2022-12-19 NOTE — Transitions of Care (Post Inpatient/ED Visit) (Signed)
12/19/2022  Name: Robert Rhodes MRN: [id number] DOB: 11/16/1945  Today's TOC FU Call Status: Today's TOC FU Call Status:: Successful TOC FU Call Competed TOC FU Call Complete Date: 12/19/22  Transition Care Management Follow-up Telephone Call Date of Discharge: 12/17/22 Discharge Facility: [facility]) Type of Discharge: Inpatient Admission Primary Inpatient Discharge Diagnosis:: Osteoarthritis of left Knee How have you been since you were released from the hospital?: Better Any questions or concerns?: No  Items Reviewed: Did you receive and understand the discharge instructions provided?: Yes Medications obtained,verified, and reconciled?: Yes (Medications Reviewed) Any new allergies since your discharge?: No Dietary orders reviewed?: Yes Type of Diet Ordered:: Heart healthy low cholestrol diet Do you have support at home?: Yes People in Home: sibling(s) Name of Support/Comfort Primary Source: Brother  Medications Reviewed Today: Medications Reviewed Today     Reviewed by Vic Blackbird, RN (Registered Nurse) on 12/19/22 at 1722  Med List Status: <None>   Medication Order Taking? Sig Documenting Provider Last Dose Status Informant  amLODipine (NORVASC) 5 MG tablet [id number] No TAKE 1 TABLET BY MOUTH ONCE DAILY- HYPERTENSION Hoy Register, MD 12/16/2022 Active Self  atorvastatin (LIPITOR) 20 MG tablet [id number] No Take 1 tablet (20 mg total) by mouth daily. Hoy Register, MD 12/16/2022 Active Self  docusate sodium (COLACE) 100 MG capsule [id number]  Take 1 capsule (100 mg total) by mouth daily as needed. Cristie Hem, PA-C  Active            Med Note Isabel Caprice, DESTINY   Fri Dec 16, 2022  9:37 AM) To be taken after surgery  Homeopathic Products (ARNICA EX) [id number] No Apply 1 Application topically daily as needed (bruising). [provider] 12/14/2022 Active Self  MAGNESIUM PO [id number] No Take 1 tablet by mouth daily. [provider] 12/16/2022 Active  Self  methocarbamol (ROBAXIN-750) 750 MG tablet [id number]  Take 1 tablet (750 mg total) by mouth 2 (two) times daily as needed for muscle spasms. Cristie Hem, PA-C  Active            Med Note Isabel Caprice, DESTINY   Fri Dec 16, 2022  9:38 AM) To be taken after surgery  ondansetron (ZOFRAN) 4 MG tablet [id number]  Take 1 tablet (4 mg total) by mouth every 8 (eight) hours as needed for nausea or vomiting. Cristie Hem, PA-C  Active            Med Note Isabel Caprice, DESTINY   Fri Dec 16, 2022  9:38 AM) To be taken after surgery.  oxyCODONE-acetaminophen (PERCOCET) 5-325 MG tablet [id number]  Take 1-2 tablets by mouth 3 (three) times daily as needed. Cristie Hem, PA-C  Active            Med Note Isabel Caprice, DESTINY   Fri Dec 16, 2022  9:39 AM) To be taken after surgery  POTASSIUM PO [id number] No Take 1 tablet by mouth daily. [provider] 12/16/2022 Active Self  propranolol (INDERAL) 10 MG tablet [id number] No Take 10 mg by mouth daily as needed (claustrophobia). [provider] 12/16/2022 Active Self  rivaroxaban (XARELTO) 10 MG TABS tablet [id number] No Take 1 tablet (10 mg total) by mouth daily. To be taken after surgery to prevent blood clots Cristie Hem, PA-C More than a month Active   VITAMIN D PO [id number] No Take 1 tablet by mouth 2 (two) times a week. [provider] Past Month Active Self  Home Care and Equipment/Supplies: Were Home Health Services Ordered?: No Any new equipment or medical supplies ordered?: No  Functional Questionnaire: Do you need assistance with bathing/showering or dressing?: No Do you need assistance with meal preparation?: No Do you need assistance with eating?: No Do you have difficulty maintaining continence: No Do you need assistance with getting out of bed/getting out of a chair/moving?: Yes (Had knee surgery has disposible wound vac in place. HH to come out on Tuesday of this week.) Do you have difficulty managing or  taking your medications?: No  Follow up appointments reviewed: PCP Follow-up appointment confirmed?: Yes Date of PCP follow-up appointment?: 01/02/23 Follow-up Provider: Dr. Risa Grill Specialist [facility] Follow-up appointment confirmed?: Yes Date of Specialist follow-up appointment?: 12/30/22 Follow-Up Specialty Provider:: Dr. Glee Arvin (Calling [facility] to have sound vac removed in one week.) Do you need transportation to your follow-up appointment?: No Do you understand care options if your condition(s) worsen?: Yes-patient verbalized understanding    SIGNATURE : Elsie Lincoln, RN

## 2022-12-20 ENCOUNTER — Other Ambulatory Visit: Payer: Self-pay | Admitting: *Deleted

## 2022-12-20 ENCOUNTER — Telehealth: Payer: Self-pay | Admitting: *Deleted

## 2022-12-20 ENCOUNTER — Telehealth: Payer: Self-pay | Admitting: Orthopaedic Surgery

## 2022-12-20 DIAGNOSIS — Z85038 Personal history of other malignant neoplasm of large intestine: Secondary | ICD-10-CM | POA: Diagnosis not present

## 2022-12-20 DIAGNOSIS — M1712 Unilateral primary osteoarthritis, left knee: Secondary | ICD-10-CM

## 2022-12-20 DIAGNOSIS — Z96652 Presence of left artificial knee joint: Secondary | ICD-10-CM

## 2022-12-20 DIAGNOSIS — I10 Essential (primary) hypertension: Secondary | ICD-10-CM | POA: Diagnosis not present

## 2022-12-20 DIAGNOSIS — H409 Unspecified glaucoma: Secondary | ICD-10-CM | POA: Diagnosis not present

## 2022-12-20 DIAGNOSIS — M810 Age-related osteoporosis without current pathological fracture: Secondary | ICD-10-CM | POA: Diagnosis not present

## 2022-12-20 DIAGNOSIS — E559 Vitamin D deficiency, unspecified: Secondary | ICD-10-CM | POA: Diagnosis not present

## 2022-12-20 DIAGNOSIS — K219 Gastro-esophageal reflux disease without esophagitis: Secondary | ICD-10-CM | POA: Diagnosis not present

## 2022-12-20 DIAGNOSIS — Z471 Aftercare following joint replacement surgery: Secondary | ICD-10-CM | POA: Diagnosis not present

## 2022-12-20 DIAGNOSIS — Z7901 Long term (current) use of anticoagulants: Secondary | ICD-10-CM | POA: Diagnosis not present

## 2022-12-20 NOTE — Telephone Encounter (Signed)
Called and left verbal okay on voicemail.

## 2022-12-20 NOTE — Telephone Encounter (Signed)
Received call from [place] with [facility] needing verbal orders for HHPT 3 Times this week and twice next week. The number to contact [phone]

## 2022-12-20 NOTE — Telephone Encounter (Signed)
Ortho bundle D/c call completed. 

## 2022-12-21 ENCOUNTER — Ambulatory Visit (INDEPENDENT_AMBULATORY_CARE_PROVIDER_SITE_OTHER): Payer: Medicare HMO | Admitting: Physician Assistant

## 2022-12-21 ENCOUNTER — Encounter: Payer: Self-pay | Admitting: Orthopaedic Surgery

## 2022-12-21 DIAGNOSIS — I10 Essential (primary) hypertension: Secondary | ICD-10-CM | POA: Diagnosis not present

## 2022-12-21 DIAGNOSIS — Z471 Aftercare following joint replacement surgery: Secondary | ICD-10-CM | POA: Diagnosis not present

## 2022-12-21 DIAGNOSIS — Z7901 Long term (current) use of anticoagulants: Secondary | ICD-10-CM | POA: Diagnosis not present

## 2022-12-21 DIAGNOSIS — H409 Unspecified glaucoma: Secondary | ICD-10-CM | POA: Diagnosis not present

## 2022-12-21 DIAGNOSIS — Z96652 Presence of left artificial knee joint: Secondary | ICD-10-CM | POA: Diagnosis not present

## 2022-12-21 DIAGNOSIS — K219 Gastro-esophageal reflux disease without esophagitis: Secondary | ICD-10-CM | POA: Diagnosis not present

## 2022-12-21 DIAGNOSIS — E559 Vitamin D deficiency, unspecified: Secondary | ICD-10-CM | POA: Diagnosis not present

## 2022-12-21 DIAGNOSIS — Z85038 Personal history of other malignant neoplasm of large intestine: Secondary | ICD-10-CM | POA: Diagnosis not present

## 2022-12-21 DIAGNOSIS — M810 Age-related osteoporosis without current pathological fracture: Secondary | ICD-10-CM | POA: Diagnosis not present

## 2022-12-21 MED ORDER — METHOCARBAMOL 750 MG PO TABS: 750.0000 mg | ORAL_TABLET | Freq: Two times a day (BID) | ORAL | 2 refills | Status: DC | PRN

## 2022-12-21 MED ORDER — OXYCODONE-ACETAMINOPHEN 5-325 MG PO TABS: 1.0000 | ORAL_TABLET | Freq: Three times a day (TID) | ORAL | 0 refills | Status: DC | PRN

## 2022-12-21 NOTE — Progress Notes (Signed)
Post-Op Visit Note   Patient: Robert Rhodes           Date of Birth: 11-05-1945           MRN: [id number] Visit Date: 12/21/2022 PCP: Hoy Register, MD   Assessment & Plan:  Chief Complaint:  Chief Complaint  Patient presents with   Left Knee - Follow-up    Left total knee arthroplasty 12/16/2022   Visit Diagnoses:  1. Status post total left knee replacement     Plan: Patient is a pleasant 77 year old gentleman who comes in today 5 days status post left total knee replacement 12/16/2022.  He is here today early as his eye VAC stopped functioning last night at 11:30 PM.  He has been ambulating with a walker and getting home health PT.  He is on Xarelto for DVT prophylaxis.  Taking Percocet and Robaxin for pain.  Overall doing okay.  Examination of the left knee reveals a well-healing surgical incision with nylon sutures in place.  No evidence of infection or cellulitis.  Calves are soft nontender.  He is neurovascular intact distally.  Today, the wound was cleaned and covered with an Aquacel.  He will continue with PT.  Continue with Xarelto.  Postop instructions provided.  Follow-up within the next week for suture removal.  Percocet refilled.  Call with concerns or questions.  Follow-Up Instructions: Return in about 9 days (around 12/30/2022).   Orders:  No orders of the defined types were placed in this encounter.  Meds ordered this encounter  Medications   oxyCODONE-acetaminophen (PERCOCET) 5-325 MG tablet    Sig: Take 1-2 tablets by mouth 3 (three) times daily as needed.    Dispense:  40 tablet    Refill:  0   methocarbamol (ROBAXIN-750) 750 MG tablet    Sig: Take 1 tablet (750 mg total) by mouth 2 (two) times daily as needed for muscle spasms.    Dispense:  20 tablet    Refill:  2    Imaging: No new imaging  PMFS History: Patient Active Problem List   Diagnosis Date Noted   Status post total left knee replacement 12/16/2022   Primary osteoarthritis of left knee  10/26/2022   Schizoaffective disorder, bipolar type (HCC) 09/20/2022   Primary osteoarthritis of right knee 11/11/2021   Status post total right knee replacement 11/11/2021   AKI (acute kidney injury) (HCC) 04/21/2019   Pneumonia due to COVID-19 virus 04/21/2019   Osteoporosis    Ulcerative colitis, with rectal bleeding 08/06/2015   Gastroesophageal reflux disease without esophagitis 08/06/2015   Visual impairment of right eye 06/18/2015   Essential hypertension, benign 06/18/2015   Hernia of abdominal cavity 11/27/2014   Bipolar 1 disorder, mixed, moderate (HCC) 11/27/2014   Cellulitis 08/23/2014   Medically noncompliant 08/23/2014   Left leg cellulitis 08/23/2014   Cellulitis of left leg    Hypokalemia    Diarrhea 05/06/2014   Ulcerative colitis (HCC) 04/17/2014   Verrucous keratosis 04/17/2014   Nonspecific (abnormal) findings on radiological and other examination of gastrointestinal tract 07/18/2013   Acute esophagitis 07/18/2013   Hypovitaminosis D 05/06/2013   Ulcerative colitis, unspecified 04/04/2013   Knee pain, bilateral 03/05/2013   Past Medical History:  Diagnosis Date   Adenomatous colon polyp    Arthritis    bilateral knees-needs replacements   Claustrophobia    Colon cancer (HCC) 2015   COVID-19    Dysrhythmia    Eye abnormalities    right eye seen in ED  02/17/2014 wearing eye patch using eye drops   GERD (gastroesophageal reflux disease)    with certain foods   Glaucoma    not on eye drops at this time (10/21/2020)   Hypertension    on meds   Irregular heartbeat    per pt/doesn't know what it does   Osteoarthritis    bilat knees  needs replacement   Osteoporosis    pt denies   Substance abuse (HCC)    stopped over 3 years ago   UC (ulcerative colitis) (HCC)    Vitamin D deficiency     Family History  Problem Relation Age of Onset   Cancer Mother        type unknown-in her leg   Diabetes Father    Heart disease Father    Heart disease Sister     Heart attack Brother    Healthy Sister    Colon cancer Neg Hx    Esophageal cancer Neg Hx    Rectal cancer Neg Hx    Stomach cancer Neg Hx    Colon polyps Neg Hx     Past Surgical History:  Procedure Laterality Date   APPLICATION OF WOUND VAC  12/16/2022   Procedure: APPLICATION OF WOUND VAC;  Surgeon: Tarry Kos, MD;  Location: [facility];  Service: Orthopedics;;   COLON RESECTION N/A 08/23/2013   Procedure: Laparoscopic total abdominal colectomy and hernia repair;  Surgeon: Romie Levee, MD;  Location: [facility];  Service: General;  Laterality: N/A;   COLON SURGERY  08/2013   COLONOSCOPY  11/02/2020   EUS N/A 07/18/2013   Procedure: UPPER ENDOSCOPIC ULTRASOUND (EUS) RADIAL;  Surgeon: Rachael Fee, MD;  Location: [facility];  Service: Endoscopy;  Laterality: N/A;   EYE SURGERY     FLEXIBLE SIGMOIDOSCOPY N/A 2020   prep good   HERNIA REPAIR  2005   In [place] in a mesh   INCISIONAL HERNIA REPAIR N/A 02/20/2014   Procedure: LAP ASSISTED INCISIONAL HERNIA REPAIR LYSIS OF ADHESIONS;  Surgeon: Romie Levee, MD;  Location: [facility];  Service: General;  Laterality: N/A;  converted to open @ 0935   INCISIONAL HERNIA REPAIR N/A 02/20/2014   Procedure: HERNIA REPAIR INCISIONAL;  Surgeon: Romie Levee, MD;  Location: [facility];  Service: General;  Laterality: N/A;  With MESH   INTRAOCULAR LENS INSERTION Bilateral    6 yrs ago   TOTAL KNEE ARTHROPLASTY Right 11/11/2021   Procedure: RIGHT TOTAL KNEE ARTHROPLASTY;  Surgeon: Tarry Kos, MD;  Location: [facility];  Service: Orthopedics;  Laterality: Right;   TOTAL KNEE ARTHROPLASTY Left 12/16/2022   Procedure: LEFT TOTAL KNEE ARTHROPLASTY;  Surgeon: Tarry Kos, MD;  Location: [facility];  Service: Orthopedics;  Laterality: Left;   VENTRAL HERNIA REPAIR     Social History   Occupational History   Occupation: retired  Tobacco Use   Smoking status: Former    Packs/day: 0    Types: Cigarettes    Quit date: 06/14/2003    Years since quitting: 19.5    Smokeless tobacco: Never   Tobacco comments:    quit in July 2005  Vaping Use   Vaping Use: Never used  Substance and Sexual Activity   Alcohol use: No   Drug use: Not Currently    Types: 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

## 2022-12-21 NOTE — Telephone Encounter (Signed)
He can come back to the office to get an aquacel.  Thanks.

## 2022-12-23 DIAGNOSIS — Z7901 Long term (current) use of anticoagulants: Secondary | ICD-10-CM | POA: Diagnosis not present

## 2022-12-23 DIAGNOSIS — H409 Unspecified glaucoma: Secondary | ICD-10-CM | POA: Diagnosis not present

## 2022-12-23 DIAGNOSIS — Z471 Aftercare following joint replacement surgery: Secondary | ICD-10-CM | POA: Diagnosis not present

## 2022-12-23 DIAGNOSIS — Z85038 Personal history of other malignant neoplasm of large intestine: Secondary | ICD-10-CM | POA: Diagnosis not present

## 2022-12-23 DIAGNOSIS — M810 Age-related osteoporosis without current pathological fracture: Secondary | ICD-10-CM | POA: Diagnosis not present

## 2022-12-23 DIAGNOSIS — I10 Essential (primary) hypertension: Secondary | ICD-10-CM | POA: Diagnosis not present

## 2022-12-23 DIAGNOSIS — Z96652 Presence of left artificial knee joint: Secondary | ICD-10-CM | POA: Diagnosis not present

## 2022-12-23 DIAGNOSIS — E559 Vitamin D deficiency, unspecified: Secondary | ICD-10-CM | POA: Diagnosis not present

## 2022-12-23 DIAGNOSIS — K219 Gastro-esophageal reflux disease without esophagitis: Secondary | ICD-10-CM | POA: Diagnosis not present

## 2022-12-28 DIAGNOSIS — Z7901 Long term (current) use of anticoagulants: Secondary | ICD-10-CM | POA: Diagnosis not present

## 2022-12-28 DIAGNOSIS — M810 Age-related osteoporosis without current pathological fracture: Secondary | ICD-10-CM | POA: Diagnosis not present

## 2022-12-28 DIAGNOSIS — Z96652 Presence of left artificial knee joint: Secondary | ICD-10-CM | POA: Diagnosis not present

## 2022-12-28 DIAGNOSIS — I10 Essential (primary) hypertension: Secondary | ICD-10-CM | POA: Diagnosis not present

## 2022-12-28 DIAGNOSIS — E559 Vitamin D deficiency, unspecified: Secondary | ICD-10-CM | POA: Diagnosis not present

## 2022-12-28 DIAGNOSIS — H409 Unspecified glaucoma: Secondary | ICD-10-CM | POA: Diagnosis not present

## 2022-12-28 DIAGNOSIS — K219 Gastro-esophageal reflux disease without esophagitis: Secondary | ICD-10-CM | POA: Diagnosis not present

## 2022-12-28 DIAGNOSIS — Z85038 Personal history of other malignant neoplasm of large intestine: Secondary | ICD-10-CM | POA: Diagnosis not present

## 2022-12-28 DIAGNOSIS — Z471 Aftercare following joint replacement surgery: Secondary | ICD-10-CM | POA: Diagnosis not present

## 2022-12-29 DIAGNOSIS — K219 Gastro-esophageal reflux disease without esophagitis: Secondary | ICD-10-CM | POA: Diagnosis not present

## 2022-12-29 DIAGNOSIS — Z96652 Presence of left artificial knee joint: Secondary | ICD-10-CM | POA: Diagnosis not present

## 2022-12-29 DIAGNOSIS — Z7901 Long term (current) use of anticoagulants: Secondary | ICD-10-CM | POA: Diagnosis not present

## 2022-12-29 DIAGNOSIS — H409 Unspecified glaucoma: Secondary | ICD-10-CM | POA: Diagnosis not present

## 2022-12-29 DIAGNOSIS — Z85038 Personal history of other malignant neoplasm of large intestine: Secondary | ICD-10-CM | POA: Diagnosis not present

## 2022-12-29 DIAGNOSIS — M810 Age-related osteoporosis without current pathological fracture: Secondary | ICD-10-CM | POA: Diagnosis not present

## 2022-12-29 DIAGNOSIS — I10 Essential (primary) hypertension: Secondary | ICD-10-CM | POA: Diagnosis not present

## 2022-12-29 DIAGNOSIS — Z471 Aftercare following joint replacement surgery: Secondary | ICD-10-CM | POA: Diagnosis not present

## 2022-12-29 DIAGNOSIS — E559 Vitamin D deficiency, unspecified: Secondary | ICD-10-CM | POA: Diagnosis not present

## 2022-12-29 NOTE — Progress Notes (Unsigned)
Post-Op Visit Note   Patient: Robert Rhodes           Date of Birth: 1945-10-17           MRN: [id number] Visit Date: 12/30/2022 PCP: Hoy Register, MD   Assessment & Plan:  Chief Complaint:  Chief Complaint  Patient presents with   Left Knee - Routine Post Op    12/16/22 left TKA   Visit Diagnoses:  1. Status post total left knee replacement     Plan: [name] is 2 weeks status post left total knee replacement.  He is doing well overall.  He has completed home health PT.  He has been compliant with the Xarelto.  Examination of the left knee shows healed surgical incision.  No signs of infection.  Range of motion is 0 to 95 degrees comfortably.  Expected postoperative swelling.  No calf tenderness and negative Homans' sign.  [name] will continue to take Xarelto for another 2 weeks for a full 4 weeks.  He is allergic to aspirin.  I have placed an order for outpatient PT.  Wound will recheck him in 4 weeks with 2 view x-rays of the left knee.  Follow-Up Instructions: Return in about 4 weeks (around 01/27/2023).   Orders:  Orders Placed This Encounter  Procedures   Ambulatory referral to Physical Therapy   No orders of the defined types were placed in this encounter.   Imaging: No results found.  PMFS History: Patient Active Problem List   Diagnosis Date Noted   Status post total left knee replacement 12/16/2022   Primary osteoarthritis of left knee 10/26/2022   Schizoaffective disorder, bipolar type (HCC) 09/20/2022   Primary osteoarthritis of right knee 11/11/2021   Status post total right knee replacement 11/11/2021   AKI (acute kidney injury) (HCC) 04/21/2019   Pneumonia due to COVID-19 virus 04/21/2019   Osteoporosis    Ulcerative colitis, with rectal bleeding 08/06/2015   Gastroesophageal reflux disease without esophagitis 08/06/2015   Visual impairment of right eye 06/18/2015   Essential hypertension, benign 06/18/2015   Hernia of abdominal  cavity 11/27/2014   Bipolar 1 disorder, mixed, moderate (HCC) 11/27/2014   Cellulitis 08/23/2014   Medically noncompliant 08/23/2014   Left leg cellulitis 08/23/2014   Cellulitis of left leg    Hypokalemia    Diarrhea 05/06/2014   Ulcerative colitis (HCC) 04/17/2014   Verrucous keratosis 04/17/2014   Nonspecific (abnormal) findings on radiological and other examination of gastrointestinal tract 07/18/2013   Acute esophagitis 07/18/2013   Hypovitaminosis D 05/06/2013   Ulcerative colitis, unspecified 04/04/2013   Knee pain, bilateral 03/05/2013   Past Medical History:  Diagnosis Date   Adenomatous colon polyp    Arthritis    bilateral knees-needs replacements   Claustrophobia    Colon cancer (HCC) 2015   COVID-19    Dysrhythmia    Eye abnormalities    right eye seen in ED 02/17/2014 wearing eye patch using eye drops   GERD (gastroesophageal reflux disease)    with certain foods   Glaucoma    not on eye drops at this time (10/21/2020)   Hypertension    on meds   Irregular heartbeat    per pt/doesn't know what it does   Osteoarthritis    bilat knees  needs replacement   Osteoporosis    pt denies   Substance abuse (HCC)    stopped over 3 years ago   UC (ulcerative colitis) (HCC)    Vitamin D  deficiency     Family History  Problem Relation Age of Onset   Cancer Mother        type unknown-in her leg   Diabetes Father    Heart disease Father    Heart disease Sister    Heart attack Brother    Healthy Sister    Colon cancer Neg Hx    Esophageal cancer Neg Hx    Rectal cancer Neg Hx    Stomach cancer Neg Hx    Colon polyps Neg Hx     Past Surgical History:  Procedure Laterality Date   APPLICATION OF WOUND VAC  12/16/2022   Procedure: APPLICATION OF WOUND VAC;  Surgeon: Tarry Kos, MD;  Location: [facility];  Service: Orthopedics;;   COLON RESECTION N/A 08/23/2013   Procedure: Laparoscopic total abdominal colectomy and hernia repair;  Surgeon: Romie Levee, MD;   Location: [facility];  Service: General;  Laterality: N/A;   COLON SURGERY  08/2013   COLONOSCOPY  11/02/2020   EUS N/A 07/18/2013   Procedure: UPPER ENDOSCOPIC ULTRASOUND (EUS) RADIAL;  Surgeon: Rachael Fee, MD;  Location: [facility];  Service: Endoscopy;  Laterality: N/A;   EYE SURGERY     FLEXIBLE SIGMOIDOSCOPY N/A 2020   prep good   HERNIA REPAIR  2005   [place] in [place]   INCISIONAL HERNIA REPAIR N/A 02/20/2014   Procedure: LAP ASSISTED INCISIONAL HERNIA REPAIR LYSIS OF ADHESIONS;  Surgeon: Romie Levee, MD;  Location: [facility];  Service: General;  Laterality: N/A;  converted to open @ 0935   INCISIONAL HERNIA REPAIR N/A 02/20/2014   Procedure: HERNIA REPAIR INCISIONAL;  Surgeon: Romie Levee, MD;  Location: [facility];  Service: General;  Laterality: N/A;  With MESH   INTRAOCULAR LENS INSERTION Bilateral    6 yrs ago   TOTAL KNEE ARTHROPLASTY Right 11/11/2021   Procedure: RIGHT TOTAL KNEE ARTHROPLASTY;  Surgeon: Tarry Kos, MD;  Location: [facility];  Service: Orthopedics;  Laterality: Right;   TOTAL KNEE ARTHROPLASTY Left 12/16/2022   Procedure: LEFT TOTAL KNEE ARTHROPLASTY;  Surgeon: Tarry Kos, MD;  Location: [facility];  Service: Orthopedics;  Laterality: Left;   VENTRAL HERNIA REPAIR     Social History   Occupational History   Occupation: retired  Tobacco Use   Smoking status: Former    Current packs/day: 0.00    Types: Cigarettes    Quit date: 06/14/2003    Years since quitting: 19.5   Smokeless tobacco: Never   Tobacco comments:    quit in July 2005  Vaping Use   Vaping status: Never Used  Substance and Sexual Activity   Alcohol use: No   Drug use: Not Currently    Types: 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

## 2022-12-30 ENCOUNTER — Ambulatory Visit (INDEPENDENT_AMBULATORY_CARE_PROVIDER_SITE_OTHER): Payer: Medicare HMO | Admitting: Orthopaedic Surgery

## 2022-12-30 DIAGNOSIS — Z96652 Presence of left artificial knee joint: Secondary | ICD-10-CM

## 2023-01-02 ENCOUNTER — Encounter: Payer: Self-pay | Admitting: Family Medicine

## 2023-01-02 ENCOUNTER — Ambulatory Visit: Payer: Medicare HMO | Attending: Family Medicine | Admitting: Family Medicine

## 2023-01-02 VITALS — BP 146/75 | HR 89 | Temp 98.6°F | Ht 69.0 in | Wt 197.6 lb

## 2023-01-02 DIAGNOSIS — I10 Essential (primary) hypertension: Secondary | ICD-10-CM

## 2023-01-02 DIAGNOSIS — R1319 Other dysphagia: Secondary | ICD-10-CM

## 2023-01-02 DIAGNOSIS — M170 Bilateral primary osteoarthritis of knee: Secondary | ICD-10-CM | POA: Diagnosis not present

## 2023-01-02 DIAGNOSIS — Z96652 Presence of left artificial knee joint: Secondary | ICD-10-CM

## 2023-01-02 NOTE — Progress Notes (Signed)
Subjective:  Patient ID: Robert Rhodes, male    DOB: 08-23-1945  Age: 77 y.o. MRN: [id number]  CC: Hospitalization Follow-up   HPI Robert Rhodes is a 77 y.o. year old male with a history of hypertension, schizophrenia (diagnosed in the 82s, stable off medications), ulcerative colitis, osteoarthritis of the knees (status post bilateral  TKA). He recently underwent left knee TKA earlier this month and has been to see his surgeon for follow-up visit.  Interval History: Discussed the use of AI scribe software for clinical note transcription with the patient, who gave verbal consent to proceed.   The patient, with a history of bilateral knee replacements, presents for a post-operative follow-up. He reports that he has been participating in outpatient rehabilitation and home exercises for his left knee. He notes persistent swelling in the knee, but describes the pain as manageable with anti-inflammatory teas.  In addition to his post-operative concerns, the patient reports new onset dysphagia. He describes difficulty swallowing solids for the past four months, which has recently progressed to include liquids. The onset of these symptoms coincided with a severe episode of acid reflux, which has since resolved after dietary changes. The patient denies current symptoms of acid reflux.        Past Medical History:  Diagnosis Date   Adenomatous colon polyp    Arthritis    bilateral knees-needs replacements   Claustrophobia    Colon cancer (HCC) 2015   COVID-19    Dysrhythmia    Eye abnormalities    right eye seen in ED 02/17/2014 wearing eye patch using eye drops   GERD (gastroesophageal reflux disease)    with certain foods   Glaucoma    not on eye drops at this time (10/21/2020)   Hypertension    on meds   Irregular heartbeat    per pt/doesn't know what it does   Osteoarthritis    bilat knees  needs replacement   Osteoporosis    pt denies   Substance abuse (HCC)    stopped  over 3 years ago   UC (ulcerative colitis) (HCC)    Vitamin D deficiency     Past Surgical History:  Procedure Laterality Date   APPLICATION OF WOUND VAC  12/16/2022   Procedure: APPLICATION OF WOUND VAC;  Surgeon: Tarry Kos, MD;  Location: [facility];  Service: Orthopedics;;   COLON RESECTION N/A 08/23/2013   Procedure: Laparoscopic total abdominal colectomy and hernia repair;  Surgeon: Romie Levee, MD;  Location: [facility];  Service: General;  Laterality: N/A;   COLON SURGERY  08/2013   COLONOSCOPY  11/02/2020   EUS N/A 07/18/2013   Procedure: UPPER ENDOSCOPIC ULTRASOUND (EUS) RADIAL;  Surgeon: Rachael Fee, MD;  Location: [facility];  Service: Endoscopy;  Laterality: N/A;   EYE SURGERY     FLEXIBLE SIGMOIDOSCOPY N/A 2020   prep good   HERNIA REPAIR  2005   In [place]/put in a mesh   INCISIONAL HERNIA REPAIR N/A 02/20/2014   Procedure: LAP ASSISTED INCISIONAL HERNIA REPAIR LYSIS OF ADHESIONS;  Surgeon: Romie Levee, MD;  Location: [facility];  Service: General;  Laterality: N/A;  converted to open @ 0935   INCISIONAL HERNIA REPAIR N/A 02/20/2014   Procedure: HERNIA REPAIR INCISIONAL;  Surgeon: Romie Levee, MD;  Location: [facility];  Service: General;  Laterality: N/A;  With MESH   INTRAOCULAR LENS INSERTION Bilateral    6 yrs ago   TOTAL KNEE ARTHROPLASTY Right 11/11/2021   Procedure: RIGHT TOTAL  KNEE ARTHROPLASTY;  Surgeon: Tarry Kos, MD;  Location: [facility];  Service: Orthopedics;  Laterality: Right;   TOTAL KNEE ARTHROPLASTY Left 12/16/2022   Procedure: LEFT TOTAL KNEE ARTHROPLASTY;  Surgeon: Tarry Kos, MD;  Location: [facility];  Service: Orthopedics;  Laterality: Left;   VENTRAL HERNIA REPAIR      Family History  Problem Relation Age of Onset   Cancer Mother        type unknown-in her leg   Diabetes Father    Heart disease Father    Heart disease Sister    Heart attack Brother    Healthy Sister    Colon cancer Neg Hx    Esophageal cancer Neg Hx    Rectal cancer Neg Hx    Stomach  cancer Neg Hx    Colon polyps Neg Hx     Social History   Socioeconomic History   Marital status: Single    Spouse name: Not on file   Number of children: 0   Years of education: Not on file   Highest education level: GED or equivalent  Occupational History   Occupation: retired  Tobacco Use   Smoking status: Former    Current packs/day: 0.00    Types: Cigarettes    Quit date: 06/14/2003    Years since quitting: 19.5   Smokeless tobacco: Never   Tobacco comments:    quit in July 2005  Vaping Use   Vaping status: Never Used  Substance and Sexual Activity   Alcohol use: No   Drug use: Not Currently    Types: 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  Other Topics Concern   Not on file  Social History Narrative   Not on file   Social Determinants of Health   Financial Resource Strain: Medium Risk (10/26/2022)   Overall Financial Resource Strain (CARDIA)    Difficulty of Paying Living Expenses: Somewhat hard  Food Insecurity: Food Insecurity Present (10/26/2022)   Hunger Vital Sign    Worried About Running Out of Food in the Last Year: Sometimes true    Ran Out of Food in the Last Year: Sometimes true  Transportation Needs: No Transportation Needs (10/26/2022)   PRAPARE - Administrator, Civil Service (Medical): No    Lack of Transportation (Non-Medical): No  Recent Concern: Transportation Needs - Unmet Transportation Needs (09/16/2022)   PRAPARE - Transportation    Lack of Transportation (Medical): No    Lack of Transportation (Non-Medical): Yes  Physical Activity: Sufficiently Active (10/26/2022)   Exercise Vital Sign    Days of Exercise per Week: 5 days    Minutes of Exercise per Session: 30 min  Stress: No Stress Concern Present (10/26/2022)   [employer] of Occupational Health - Occupational Stress Questionnaire    Feeling of Stress : Not at all  Social Connections: Moderately Isolated (10/26/2022)   Social Connection  and Isolation Panel [NHANES]    Frequency of Communication with Friends and Family: Three times a week    Frequency of Social Gatherings with Friends and Family: Once a week    Attends Religious Services: Never    Database administrator or Organizations: Yes    Attends Engineer, structural: More than 4 times per year    Marital Status: Never married    Allergies  Allergen Reactions   Ace Inhibitors Other (See Comments)    Unknown reaction- "affecting my breathing in some  kind of way"   Advil [Ibuprofen] Other (See Comments)    Muscle tightness   Aspirin Nausea And Vomiting   Tylenol [Acetaminophen] Other (See Comments)    Muscle tightness    Outpatient Medications Prior to Visit  Medication Sig Dispense Refill   amLODipine (NORVASC) 5 MG tablet TAKE 1 TABLET BY MOUTH ONCE DAILY- HYPERTENSION 90 tablet 1   atorvastatin (LIPITOR) 20 MG tablet Take 1 tablet (20 mg total) by mouth daily. 90 tablet 1   docusate sodium (COLACE) 100 MG capsule Take 1 capsule (100 mg total) by mouth daily as needed. 30 capsule 2   Homeopathic Products (ARNICA EX) Apply 1 Application topically daily as needed (bruising).     MAGNESIUM PO Take 1 tablet by mouth daily.     methocarbamol (ROBAXIN-750) 750 MG tablet Take 1 tablet (750 mg total) by mouth 2 (two) times daily as needed for muscle spasms. 20 tablet 2   ondansetron (ZOFRAN) 4 MG tablet Take 1 tablet (4 mg total) by mouth every 8 (eight) hours as needed for nausea or vomiting. 40 tablet 0   oxyCODONE-acetaminophen (PERCOCET) 5-325 MG tablet Take 1-2 tablets by mouth 3 (three) times daily as needed. 40 tablet 0   POTASSIUM PO Take 1 tablet by mouth daily.     propranolol (INDERAL) 10 MG tablet Take 10 mg by mouth daily as needed (claustrophobia).     rivaroxaban (XARELTO) 10 MG TABS tablet Take 1 tablet (10 mg total) by mouth daily. To be taken after surgery to prevent blood clots 30 tablet 0   VITAMIN D PO Take 1 tablet by mouth 2 (two) times  a week.     No facility-administered medications prior to visit.     ROS Review of Systems  Constitutional:  Negative for activity change and appetite change.  HENT:  Negative for sinus pressure and sore throat.   Respiratory:  Negative for chest tightness, shortness of breath and wheezing.   Cardiovascular:  Negative for chest pain and palpitations.  Gastrointestinal:  Negative for abdominal distention, abdominal pain and constipation.  Genitourinary: Negative.   Musculoskeletal:        See HPI  Psychiatric/Behavioral:  Negative for behavioral problems and dysphoric mood.     Objective:  BP (!) 146/75   Pulse 89   Temp 98.6 F (37 C) (Oral)   Ht 5\' 9"  (1.753 m)   Wt 197 lb 9.6 oz (89.6 kg)   SpO2 100%   BMI 29.18 kg/m      01/02/2023   10:52 AM 12/17/2022    8:15 AM 12/17/2022    6:00 AM  BP/Weight  Systolic BP 146 118 119  Diastolic BP 75 58 58  Wt. (Lbs) 197.6    BMI 29.18 kg/m2        Physical Exam Constitutional:      Appearance: He is well-developed.  Cardiovascular:     Rate and Rhythm: Normal rate.     Heart sounds: Normal heart sounds. No murmur heard. Pulmonary:     Effort: Pulmonary effort is normal.     Breath sounds: Normal breath sounds. No wheezing or rales.  Chest:     Chest wall: No tenderness.  Abdominal:     General: Bowel sounds are normal. There is no distension.     Palpations: Abdomen is soft. There is no mass.     Tenderness: There is no abdominal tenderness.  Musculoskeletal:     Right lower leg: No edema.  Left lower leg: Edema present.     Comments: Left knee vertical surgical scar with Steri-Strips in place Associated left knee edema with slight limitation in range of motion  Neurological:     Mental Status: He is alert and oriented to person, place, and time.  Psychiatric:        Mood and Affect: Mood normal.        Latest Ref Rng & Units 12/07/2022   12:00 PM 09/20/2022    1:51 PM 11/30/2021   11:12 AM  CMP  Glucose  70 - 99 mg/dL 82  83  81   BUN 8 - 23 mg/dL 5  6  11    Creatinine 0.61 - 1.24 mg/dL 0.10  2.72  5.36   Sodium 135 - 145 mmol/L 137  136  132   Potassium 3.5 - 5.1 mmol/L 3.7  4.5  3.8   Chloride 98 - 111 mmol/L 101  99  94   CO2 22 - 32 mmol/L 28  25  23    Calcium 8.9 - 10.3 mg/dL 9.0  9.5  9.7   Total Protein 6.0 - 8.5 g/dL  7.8    Total Bilirubin 0.0 - 1.2 mg/dL  0.3    Alkaline Phos 44 - 121 IU/L  88    AST 0 - 40 IU/L  30    ALT 0 - 44 IU/L  35      Lipid Panel     Component Value Date/Time   CHOL 128 10/11/2021 1041   TRIG 81 10/11/2021 1041   HDL 44 10/11/2021 1041   CHOLHDL 2.9 10/11/2021 1041   CHOLHDL 4.2 03/05/2013 1021   VLDL 28 03/05/2013 1021   LDLCALC 68 10/11/2021 1041    CBC    Component Value Date/Time   WBC 9.3 12/17/2022 0127   RBC 4.55 12/17/2022 0127   HGB 12.2 (L) 12/17/2022 0127   HGB 13.2 09/20/2022 1351   HCT 37.9 (L) 12/17/2022 0127   HCT 41.1 09/20/2022 1351   PLT 428 (H) 12/17/2022 0127   PLT 443 09/20/2022 1351   MCV 83.3 12/17/2022 0127   MCV 83 09/20/2022 1351   MCH 26.8 12/17/2022 0127   MCHC 32.2 12/17/2022 0127   RDW 15.5 12/17/2022 0127   RDW 15.5 (H) 09/20/2022 1351   LYMPHSABS 2.7 09/20/2022 1351   MONOABS 1.4 (H) 11/17/2021 1115   EOSABS 0.2 09/20/2022 1351   BASOSABS 0.1 09/20/2022 1351    Lab Results  Component Value Date   HGBA1C 5.7 (H) 11/28/2022    Assessment & Plan:      Bilateral Knee Replacement: Status post recent left knee replacement.  Post-operative status with ongoing outpatient rehabilitation. Swelling present but pain is well-managed. -Continue outpatient rehabilitation. -Follow up with orthopedic surgeon in 4 weeks.  Dysphagia: New onset difficulty swallowing, initially with solids and now with liquids. No current symptoms of acid reflux. -Report symptoms to gastroenterologist at upcoming appointment on 01/11/2023. -Possible endoscopic evaluation to assess cause of dysphagia.     Hypertension:  Elevated BP but BP at last visit was normal -Continue with antihypertensives and we will reassess at next visit -Counseled on blood pressure goal of less than 130/80, low-sodium, DASH diet, medication compliance, 150 minutes of moderate intensity exercise per week. Discussed medication compliance, adverse effects.       No orders of the defined types were placed in this encounter.   Follow-up: Return for previously scheduled appointment.       Hoy Register, MD,  FAAFP. [facility] [place], [place] [phone]   01/02/2023, 11:34 AM

## 2023-01-02 NOTE — Progress Notes (Signed)
Trouble swallowing.

## 2023-01-02 NOTE — Patient Instructions (Signed)
Dysphagia Eating Plan, Bite Size Food This diet is recommended for people who are not able to bite pieces of food but are able to chew. You may need this diet if you have weakness of the muscles that control swallowing, you have an increased risk of choking, or you suffer from fatigue when chewing. Foods in the diet are soft, tender, and moist. Work with your health care provider, your diet and nutrition specialist (dietitian), or speech-language pathologist to make sure you are following the eating plan safely and getting all the nutrients you need. What are tips for following this plan? Cooking To moisten foods, add liquids while you are blending, mashing, or grinding your foods to the right consistency. These liquids include gravies, sauces, vegetable or fruit juice, milk, half and half, or water. Strain extra liquid from foods before eating. Reheat foods slowly to prevent a tough crust from forming. Prepare foods in advance. Meal planning Eat a variety of foods to get all the nutrients you need. Some foods may be tolerated better than others. Work with your Public relations account executive to identify which foods are safest for you to eat. Follow your meal plan as told by your dietitian. General information You may eat foods that are tender, soft, and moist. Always test food texture before taking a bite. Poke food with a fork or spoon to make sure it is tender. The test sample should squash, break apart, or change shape, and it should not return to its original shape when the fork or spoon is removed. Food should be easy to cut and chew. Avoid large pieces of food that require a lot of chewing. Take small bites. Each bite should be smaller than your thumbnail (about 15 mm by 15 mm for adults and 8mm by 8 mm for children). If you were on a pureed or minced food eating plan, you may eat any of the foods included in those diets. Avoid foods that are very dry, hard, sticky, chewy, coarse, or  crunchy. If instructed by your health care provider, thicken liquids. Follow your health care provider's instructions for what products to use, how to do this, and to what thickness. What foods should I eat?        Fruits Canned or cooked fruits that are soft or moist and do not have skin or seeds. Fresh, soft bananas. Vegetables Soft, well-cooked vegetables in small pieces. Soft-cooked, mashed potatoes. Grains Moist breads without nuts or seeds. Biscuits, muffins, pancakes, and waffles that are well-moistened with syrup, jelly, margarine, or butter. Cooked cereals. Moist bread stuffing. Moist rice. Well-moistened cold cereal with small chunks. Well-cooked pasta, noodles, and rice in small pieces and thick sauce. Soft dumplings or spaetzle in small pieces and butter or gravy. Meats and other proteins Tender, moist meats or poultry in small pieces. Moist meatballs or meatloaf. Fish without bones. Eggs or egg substitutes in small pieces. Tofu. Tempeh and meat alternatives in small pieces. Well-cooked, tender beans, peas, baked beans, and other legumes. Dairy Milk. Cream cheese. Yogurt. Cottage cheese. Sour cream. Small pieces of soft cheese. Fats and oils Butter. Oils. Margarine. Mayonnaise. Gravy. Spreads. Sweets and desserts Soft, smooth, moist desserts. Pudding. Custard. Moist cakes. Jam. Jelly. Honey. Preserves. Ask your health care provider whether you can have frozen desserts. Seasonings and other foods All seasonings and sweeteners. All sauces with small chunks. Prepared tuna, egg, or chicken salad without raw fruits or vegetables. Moist casseroles with small, tender pieces of meat. Soups with tender meat. The items listed  above may not be a complete list of foods and beverages you can eat. Contact a dietitian for more information. What foods should I avoid? Fruits Hard, crunchy, stringy, high-pulp, and juicy raw fruits such as apples, pineapple, papaya, and watermelon. Small, round  fruits, such as grapes. Dried fruit and fruit leather. Vegetables All raw vegetables. Cooked corn. Rubbery or stiff cooked vegetables. Stringy vegetables, such as celery. Tough, crisp fried potatoes. Potato skins. Grains Coarse or dry cereals. Dry breads. Toast. Crackers. Tough, crusty breads, such as [place] bread and baguettes. Dry pancakes, waffles, and muffins. Sticky rice. Dry bread stuffing. Granola. Popcorn. Chips. Meats and other proteins Large pieces of meat. Dry, tough meats, such as bacon, sausage, and hot dogs. Chicken, [place], or fish with skin and bones. Crunchy peanut butter. Nuts. Seeds. Nut and seed butters. Dairy Yogurt with nuts, seeds, or large chunks. Large chunks of cheese. Sweets and desserts Dry cakes. Chewy or dry cookies. Any desserts with nuts, seeds, dry fruits, coconut, pineapple, or anything dry, sticky, or hard. Chewy caramel. Licorice. Taffy-type candies. Ask your health care provider whether you can have frozen desserts. Seasonings and other foods Soups with tough or large chunks of meats, poultry, or vegetables. Corn or clam chowder. Smoothies with large chunks of fruit. The items listed above may not be a complete list of foods and beverages you should avoid. Contact a dietitian for more information. Summary Bite-size foods can be helpful for people with swallowing problems. On this dysphagia eating plan, you may eat foods that are soft, moist, and cut into pieces smaller than your thumbnail (about 15 mm by 15 mm for adults and 8mm by 8 mm for children). You may be instructed to thicken liquids. Follow your health care provider's instructions about how to do this and to what consistency. This information is not intended to replace advice given to you by your health care provider. Make sure you discuss any questions you have with your health care provider. Document Revised: 07/22/2021 Document Reviewed: 07/22/2021 Elsevier Patient Education  2024 [employer].

## 2023-01-04 NOTE — Therapy (Signed)
OUTPATIENT PHYSICAL THERAPY LOWER EXTREMITY EVALUATION   Patient Name: Robert Rhodes MRN: [id number] DOB:12-30-1945, 77 y.o., male Today's Date: 01/05/2023  END OF SESSION:  PT End of Session - 01/05/23 1146     Visit Number 1    Date for PT Re-Evaluation 03/02/23    Authorization Type [employer]    Authorization Time Period auth tbd    Progress Note Due on Visit 10    PT Start Time 1146    PT Stop Time 1234    PT Time Calculation (min) 48 min    Activity Tolerance Patient tolerated treatment well    Behavior During Therapy WFL for tasks assessed/performed             Past Medical History:  Diagnosis Date   Adenomatous colon polyp    Arthritis    bilateral knees-needs replacements   Claustrophobia    Colon cancer (HCC) 2015   COVID-19    Dysrhythmia    Eye abnormalities    right eye seen in ED 02/17/2014 wearing eye patch using eye drops   GERD (gastroesophageal reflux disease)    with certain foods   Glaucoma    not on eye drops at this time (10/21/2020)   Hypertension    on meds   Irregular heartbeat    per pt/doesn't know what it does   Osteoarthritis    bilat knees  needs replacement   Osteoporosis    pt denies   Substance abuse (HCC)    stopped over 3 years ago   UC (ulcerative colitis) (HCC)    Vitamin D deficiency    Past Surgical History:  Procedure Laterality Date   APPLICATION OF WOUND VAC  12/16/2022   Procedure: APPLICATION OF WOUND VAC;  Surgeon: Tarry Kos, MD;  Location: [facility];  Service: Orthopedics;;   COLON RESECTION N/A 08/23/2013   Procedure: Laparoscopic total abdominal colectomy and hernia repair;  Surgeon: Romie Levee, MD;  Location: [facility];  Service: General;  Laterality: N/A;   COLON SURGERY  08/2013   COLONOSCOPY  11/02/2020   EUS N/A 07/18/2013   Procedure: UPPER ENDOSCOPIC ULTRASOUND (EUS) RADIAL;  Surgeon: Rachael Fee, MD;  Location: [facility];  Service: Endoscopy;  Laterality: N/A;   EYE SURGERY     FLEXIBLE  SIGMOIDOSCOPY N/A 2020   prep good   HERNIA REPAIR  2005   In [place]/put in a mesh   INCISIONAL HERNIA REPAIR N/A 02/20/2014   Procedure: LAP ASSISTED INCISIONAL HERNIA REPAIR LYSIS OF ADHESIONS;  Surgeon: Romie Levee, MD;  Location: [facility];  Service: General;  Laterality: N/A;  converted to open @ 0935   INCISIONAL HERNIA REPAIR N/A 02/20/2014   Procedure: HERNIA REPAIR INCISIONAL;  Surgeon: Romie Levee, MD;  Location: [facility];  Service: General;  Laterality: N/A;  With MESH   INTRAOCULAR LENS INSERTION Bilateral    6 yrs ago   TOTAL KNEE ARTHROPLASTY Right 11/11/2021   Procedure: RIGHT TOTAL KNEE ARTHROPLASTY;  Surgeon: Tarry Kos, MD;  Location: [facility];  Service: Orthopedics;  Laterality: Right;   TOTAL KNEE ARTHROPLASTY Left 12/16/2022   Procedure: LEFT TOTAL KNEE ARTHROPLASTY;  Surgeon: Tarry Kos, MD;  Location: [facility];  Service: Orthopedics;  Laterality: Left;   VENTRAL HERNIA REPAIR     Patient Active Problem List   Diagnosis Date Noted   Status post total left knee replacement 12/16/2022   Primary osteoarthritis of left knee 10/26/2022   Schizoaffective disorder, bipolar type (HCC) 09/20/2022  Primary osteoarthritis of right knee 11/11/2021   Status post total right knee replacement 11/11/2021   AKI (acute kidney injury) (HCC) 04/21/2019   Pneumonia due to COVID-19 virus 04/21/2019   Osteoporosis    Ulcerative colitis, with rectal bleeding 08/06/2015   Gastroesophageal reflux disease without esophagitis 08/06/2015   Visual impairment of right eye 06/18/2015   Essential hypertension, benign 06/18/2015   Hernia of abdominal cavity 11/27/2014   Bipolar 1 disorder, mixed, moderate (HCC) 11/27/2014   Cellulitis 08/23/2014   Medically noncompliant 08/23/2014   Left leg cellulitis 08/23/2014   Cellulitis of left leg    Hypokalemia    Diarrhea 05/06/2014   Ulcerative colitis (HCC) 04/17/2014   Verrucous keratosis 04/17/2014   Nonspecific (abnormal) findings on radiological and  other examination of gastrointestinal tract 07/18/2013   Acute esophagitis 07/18/2013   Hypovitaminosis D 05/06/2013   Ulcerative colitis, unspecified 04/04/2013   Knee pain, bilateral 03/05/2013    PCP: Hoy Register, MD  REFERRING PROVIDER: Tarry Kos, MD  REFERRING DIAG: [phone] (ICD-10-CM) - Status post total left knee replacement  THERAPY DIAG:  Left knee pain, unspecified chronicity  Stiffness of left knee, not elsewhere classified  Other abnormalities of gait and mobility  Rationale for Evaluation and Treatment: Rehabilitation  ONSET DATE: 12/16/22  SUBJECTIVE:   SUBJECTIVE STATEMENT: Pt states his pain hasn't been bad since surgery, weaning off of medication. Icing 2-3 times a day. Incision healing well. Saw surgeon last week, says everything healing well. Has a little bit of numbness in medial calf he states is similar to his prior surgery. Had a couple sessions of HHPT, went well per pt.   PERTINENT HISTORY: HTN, GERD, osteoporosis, s/p L TKA, cancer hx   PAIN:  Are you having pain: no pain Location/description: mostly soreness in L knee Best-worst over past week: 0-6/10  - aggravating factors: WB (especially in the morning), bending  - Easing factors: rest, ice    PRECAUTIONS: fall risk   WEIGHT BEARING RESTRICTIONS: No  FALLS:  Has patient fallen in last 6 months? 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, is a Secondary school teacher  PLOF: independent with SPC, uses sock aid for lower body, driving  PATIENT GOALS: get better with movement  NEXT MD VISIT: 4 weeks  OBJECTIVE:   DIAGNOSTIC FINDINGS: s/p L TKA 12/16/22  PATIENT SURVEYS:  FOTO 55 current, 62 predicted  COGNITION: Overall cognitive status: Within  functional limits for tasks assessed     SENSATION: Light touch intact and symmetrical although pt endorses mild numbness medial calf  EDEMA:  Mild swelling WNL, no eyrthema or drainage noted today  MUSCLE LENGTH: NT  POSTURE: forward flexed trunk, reduced knee ext BIL in static standing  PALPATION: Mild tenderness about L knee joint as expected, no quad/calf tenderness  LOWER EXTREMITY ROM:     Active  Right eval Left eval  Hip flexion    Hip extension    Hip internal rotation    Hip external rotation    Knee extension  A: lacking 5 deg  Knee flexion  A: 103 deg  (Blank rows = not tested) (Key: WFL = within functional limits not formally assessed, * = concordant pain, s = stiffness/stretching sensation, NT = not tested)  Comments:    LOWER  EXTREMITY MMT:    MMT Right eval Left eval  Hip flexion    Hip abduction (modified sitting)    Hip internal rotation    Hip external rotation    Knee flexion    Knee extension    Ankle dorsiflexion     (Blank rows = not tested) (Key: WFL = within functional limits not formally assessed, * = concordant pain, s = stiffness/stretching sensation, NT = not tested)  Comments: deferred given proximity to surgery  LOWER EXTREMITY SPECIAL TESTS:  deferred  FUNCTIONAL TESTS:  5xSTS: 13.22sec UE support first two reps, noted L knee pain TUG: 14.83sec without AD    GAIT: Distance walked: within clinic Assistive device utilized: Quad cane small base Level of assistance: Modified independence Comments: partial step through pattern, reduced L knee ROM throughout all phases of gait, fwd flexed posture   TODAY'S TREATMENT:                                                                                                                              [facility] Adult PT Treatment:                                                DATE: 01/05/23 Therapeutic Exercise: Heel slides supine Propped quad set x10 LAQ x5 HEP handout +  education     PATIENT EDUCATION:  Education details: Pt education on PT impairments, prognosis, and POC. Informed consent. Rationale for interventions, safe/appropriate HEP performance Person educated: Patient Education method: Explanation, Demonstration, Tactile cues, Verbal cues, and Handouts Education comprehension: verbalized understanding, returned demonstration, verbal cues required, tactile cues required, and needs further education    HOME EXERCISE PROGRAM: Access Code: [email or web address] URL: [email or web address] Date: 01/05/2023 Prepared by: Fransisco Hertz  Exercises - Long Sitting Quad Set with Towel Roll Under Heel  - 2-3 x daily - 7 x weekly - 1 sets - 10 reps - Supine Heel Slide  - 2-3 x daily - 7 x weekly - 1 sets - 10 reps - Seated Long Arc Quad  - 2-3 x daily - 7 x weekly - 1 sets - 10 reps - Sit to Stand with Armchair  - 2-3 x daily - 7 x weekly - 1 sets - 10 reps  ASSESSMENT:  CLINICAL IMPRESSION: Pt is a very pleasant 77 year old gentleman who arrives to PT evaluation on this date for L TKA on 12/16/22. Pt endorses continued improvement since surgery, primary complaint of soreness/stiffness. On exam pt demos limitations in knee mobility and quad activation as expected postoperatively but overall appears to be doing quite well. Tolerates HEP without issue, no adverse events. Recommend skilled PT to address aforementioned deficits to improve functional independence/tolerance. Pt departs today's session in no acute distress, all voiced questions/concerns addressed appropriately from PT perspective.    OBJECTIVE  IMPAIRMENTS: Abnormal gait, decreased activity tolerance, decreased balance, decreased endurance, decreased mobility, difficulty walking, decreased ROM, decreased strength, increased edema, improper body mechanics, postural dysfunction, and pain.   ACTIVITY LIMITATIONS: carrying, lifting, bending, standing, squatting, stairs, transfers, and locomotion  level  PARTICIPATION LIMITATIONS: meal prep, cleaning, and laundry  PERSONAL FACTORS: Age, Time since onset of injury/illness/exacerbation, and 3+ comorbidities: HTN, osteoporosis, hx colon cancer  are also affecting patient's functional outcome.   REHAB POTENTIAL: Good  CLINICAL DECISION MAKING: Stable/uncomplicated  EVALUATION COMPLEXITY: Low   GOALS: Goals reviewed with patient? No  SHORT TERM GOALS: Target date: 02/02/2023 Pt will demonstrate appropriate understanding and performance of initially prescribed HEP in order to facilitate improved independence with management of symptoms.  Baseline: HEP provided on eval Goal status: INITIAL   2. Pt will score greater than or equal to 59 on FOTO in order to demonstrate improved perception of function due to symptoms.  Baseline: 55  Goal status: INITIAL    LONG TERM GOALS: Target date: 03/02/2023 Pt will score 62 or greater on FOTO in order to demonstrate improved perception of function due to symptoms.  Baseline: 55 Goal status: INITIAL  2.  Pt will demonstrate at least 0-120 degrees of left knee AROM in order to facilitate improved tolerance to functional movements such as squatting/walking. Baseline: see ROM chart above Goal status: INITIAL  3. Pt will be able to perform TUG in less than or equal to 11sec sec in order to indicate reduced risk of falling (cutoff score for fall risk 13.5 sec in community dwelling older adults per [facility] et al, 2000)  Baseline: 14sec  Goal status: INITIAL    4.  Pt will be able to perform 5xSTS in less than or equal to 11sec without UE support in order to demonstrate reduced fall risk and improved functional independence (MCID 5xSTS = 2.3 sec). Baseline: 13sec UE support first two reps Goal status: INITIAL   5. Pt will demonstrate symmetrical knee flex/ext MMT in order to facilitate improved functional strength.  Baseline: NT on eval given proximity to surgery  Goal status: INITIAL  6.  Pt will report at least 50% decrease in overall pain levels in past week in order to facilitate improved tolerance to basic ADLs/mobility.   Baseline: 0-6/10  Goal status: INITIAL    7. Pt will demonstrate appropriate performance of final prescribed HEP in order to facilitate improved self-management of symptoms post-discharge.   Baseline: initial HEP prescribed  Goal status: INITIAL     PLAN:  PT FREQUENCY: 2x/week  PT DURATION: 8 weeks  PLANNED INTERVENTIONS: Therapeutic exercises, Therapeutic activity, Neuromuscular re-education, Balance training, Gait training, Patient/Family education, Self Care, Joint mobilization, Stair training, Aquatic Therapy, Dry Needling, Cryotherapy, Moist heat, scar mobilization, Taping, Manual therapy, and Re-evaluation  PLAN FOR NEXT SESSION: Review/update HEP PRN. Work on [employer] exercises as appropriate with emphasis on quad activation and muscular control throughout ROM. Symptom modification strategies as indicated/appropriate.    Ashley Murrain PT, DPT 01/05/2023 1:08 PM    Referring diagnosis? Z61.096 (ICD-10-CM) - Status post total left knee replacement Treatment diagnosis? (if different than referring diagnosis) Left knee pain, unspecified chronicity  Stiffness of left knee, not elsewhere classified  Other abnormalities of gait and mobility What was this (referring dx) caused by? [x]  Surgery []  Fall []  Ongoing issue []  Arthritis []  Other: ____________  Laterality: []  Rt [x]  Lt []  Both  Check all possible CPT codes:  *CHOOSE 10 OR LESS*    []  97110 (Therapeutic  Exercise)  []  92507 (SLP Treatment)  []  [id number] (Neuro Re-ed)   []  92526 (Swallowing Treatment)   []  19147 (Gait Training)   []  [phone] (Cognitive Training, 1st 15 minutes) []  97140 (Manual Therapy)   []  97130 (Cognitive Training, each add'l 15 minutes)  []  97164 (Re-evaluation)                              []  Other, List CPT Code ____________  []  97530 (Therapeutic  Activities)     []  97535 (Self Care)   [x]  All codes above (97110 - 97535)  []  97012 (Mechanical Traction)  []  97014 (E-stim Unattended)  []  97032 (E-stim manual)  []  97033 (Ionto)  []  97035 (Ultrasound) [x]  97750 (Physical Performance Training) [x]  [id number] (Aquatic Therapy) []  97016 (Vasopneumatic Device) []  [id number] (Paraffin) []  97034 (Contrast Bath) []  97597 (Wound Care 1st 20 sq cm) []  97598 (Wound Care each add'l 20 sq cm) []  97760 (Orthotic Fabrication, Fitting, Training Initial) []  [id number] (Prosthetic Management and Training Initial) []  [id number] (Orthotic or Prosthetic Training/ Modification Subsequent)

## 2023-01-05 ENCOUNTER — Other Ambulatory Visit: Payer: Self-pay

## 2023-01-05 ENCOUNTER — Encounter: Payer: Self-pay | Admitting: Physical Therapy

## 2023-01-05 ENCOUNTER — Ambulatory Visit: Payer: Medicare HMO | Attending: Orthopaedic Surgery | Admitting: Physical Therapy

## 2023-01-05 DIAGNOSIS — Z96652 Presence of left artificial knee joint: Secondary | ICD-10-CM | POA: Insufficient documentation

## 2023-01-05 DIAGNOSIS — M25562 Pain in left knee: Secondary | ICD-10-CM | POA: Diagnosis not present

## 2023-01-05 DIAGNOSIS — R2689 Other abnormalities of gait and mobility: Secondary | ICD-10-CM | POA: Insufficient documentation

## 2023-01-05 DIAGNOSIS — M25662 Stiffness of left knee, not elsewhere classified: Secondary | ICD-10-CM | POA: Insufficient documentation

## 2023-01-10 ENCOUNTER — Ambulatory Visit: Payer: Medicare HMO

## 2023-01-10 DIAGNOSIS — R2689 Other abnormalities of gait and mobility: Secondary | ICD-10-CM

## 2023-01-10 DIAGNOSIS — M25662 Stiffness of left knee, not elsewhere classified: Secondary | ICD-10-CM | POA: Diagnosis not present

## 2023-01-10 DIAGNOSIS — M25562 Pain in left knee: Secondary | ICD-10-CM

## 2023-01-10 DIAGNOSIS — Z96652 Presence of left artificial knee joint: Secondary | ICD-10-CM | POA: Diagnosis not present

## 2023-01-10 NOTE — Therapy (Signed)
OUTPATIENT PHYSICAL THERAPY TREATMENT NOTE   Patient Name: BENJIMAN HODGEMAN MRN: [id number] DOB:Jul 29, 1945, 77 y.o., male Today's Date: 01/10/2023  END OF SESSION:  PT End of Session - 01/10/23 1041     Visit Number 2    Date for PT Re-Evaluation 03/02/23    Authorization Type [employer]    Authorization Time Period 12 visita approved 01/09/23-02/25/23    Authorization - Visit Number 1    Authorization - Number of Visits 12    Progress Note Due on Visit 10    PT Start Time 1045    PT Stop Time 1125    PT Time Calculation (min) 40 min    Activity Tolerance Patient tolerated treatment well    Behavior During Therapy WFL for tasks assessed/performed              Past Medical History:  Diagnosis Date   Adenomatous colon polyp    Arthritis    bilateral knees-needs replacements   Claustrophobia    Colon cancer (HCC) 2015   COVID-19    Dysrhythmia    Eye abnormalities    right eye seen in ED 02/17/2014 wearing eye patch using eye drops   GERD (gastroesophageal reflux disease)    with certain foods   Glaucoma    not on eye drops at this time (10/21/2020)   Hypertension    on meds   Irregular heartbeat    per pt/doesn't know what it does   Osteoarthritis    bilat knees  needs replacement   Osteoporosis    pt denies   Substance abuse (HCC)    stopped over 3 years ago   UC (ulcerative colitis) (HCC)    Vitamin D deficiency    Past Surgical History:  Procedure Laterality Date   APPLICATION OF WOUND VAC  12/16/2022   Procedure: APPLICATION OF WOUND VAC;  Surgeon: Tarry Kos, MD;  Location: [facility];  Service: Orthopedics;;   COLON RESECTION N/A 08/23/2013   Procedure: Laparoscopic total abdominal colectomy and hernia repair;  Surgeon: Romie Levee, MD;  Location: [facility];  Service: General;  Laterality: N/A;   COLON SURGERY  08/2013   COLONOSCOPY  11/02/2020   EUS N/A 07/18/2013   Procedure: UPPER ENDOSCOPIC ULTRASOUND (EUS) RADIAL;  Surgeon: Rachael Fee, MD;   Location: [facility];  Service: Endoscopy;  Laterality: N/A;   EYE SURGERY     FLEXIBLE SIGMOIDOSCOPY N/A 2020   prep good   HERNIA REPAIR  2005   In [place]/put in a mesh   INCISIONAL HERNIA REPAIR N/A 02/20/2014   Procedure: LAP ASSISTED INCISIONAL HERNIA REPAIR LYSIS OF ADHESIONS;  Surgeon: Romie Levee, MD;  Location: [facility];  Service: General;  Laterality: N/A;  converted to open @ 0935   INCISIONAL HERNIA REPAIR N/A 02/20/2014   Procedure: HERNIA REPAIR INCISIONAL;  Surgeon: Romie Levee, MD;  Location: [facility];  Service: General;  Laterality: N/A;  With MESH   INTRAOCULAR LENS INSERTION Bilateral    6 yrs ago   TOTAL KNEE ARTHROPLASTY Right 11/11/2021   Procedure: RIGHT TOTAL KNEE ARTHROPLASTY;  Surgeon: Tarry Kos, MD;  Location: [facility];  Service: Orthopedics;  Laterality: Right;   TOTAL KNEE ARTHROPLASTY Left 12/16/2022   Procedure: LEFT TOTAL KNEE ARTHROPLASTY;  Surgeon: Tarry Kos, MD;  Location: [facility];  Service: Orthopedics;  Laterality: Left;   VENTRAL HERNIA REPAIR     Patient Active Problem List   Diagnosis Date Noted   Status post total left  knee replacement 12/16/2022   Primary osteoarthritis of left knee 10/26/2022   Schizoaffective disorder, bipolar type (HCC) 09/20/2022   Primary osteoarthritis of right knee 11/11/2021   Status post total right knee replacement 11/11/2021   AKI (acute kidney injury) (HCC) 04/21/2019   Pneumonia due to COVID-19 virus 04/21/2019   Osteoporosis    Ulcerative colitis, with rectal bleeding 08/06/2015   Gastroesophageal reflux disease without esophagitis 08/06/2015   Visual impairment of right eye 06/18/2015   Essential hypertension, benign 06/18/2015   Hernia of abdominal cavity 11/27/2014   Bipolar 1 disorder, mixed, moderate (HCC) 11/27/2014   Cellulitis 08/23/2014   Medically noncompliant 08/23/2014   Left leg cellulitis 08/23/2014   Cellulitis of left leg    Hypokalemia    Diarrhea 05/06/2014   Ulcerative colitis (HCC)  04/17/2014   Verrucous keratosis 04/17/2014   Nonspecific (abnormal) findings on radiological and other examination of gastrointestinal tract 07/18/2013   Acute esophagitis 07/18/2013   Hypovitaminosis D 05/06/2013   Ulcerative colitis, unspecified 04/04/2013   Knee pain, bilateral 03/05/2013    PCP: Hoy Register, MD  REFERRING PROVIDER: Tarry Kos, MD  REFERRING DIAG: [phone] (ICD-10-CM) - Status post total left knee replacement  THERAPY DIAG:  Left knee pain, unspecified chronicity  Stiffness of left knee, not elsewhere classified  Other abnormalities of gait and mobility  Rationale for Evaluation and Treatment: Rehabilitation  ONSET DATE: 12/16/22  SUBJECTIVE:   SUBJECTIVE STATEMENT: Patient reports that his knee is in a bit of pain today, has been compliant with HEP, icing, and has not had a pain pill in a few days.  PERTINENT HISTORY: HTN, GERD, osteoporosis, s/p L TKA, cancer hx   PAIN:  Are you having pain: 5/10  Location/description: mostly soreness in L knee Best-worst over past week: 0-6/10  - aggravating factors: WB (especially in the morning), bending  - Easing factors: rest, ice    PRECAUTIONS: fall risk   WEIGHT BEARING RESTRICTIONS: No  FALLS:  Has patient fallen in last 6 months? 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, is a Secondary school teacher  PLOF: independent with SPC, uses sock aid for lower body, driving  PATIENT GOALS: get better with movement  NEXT MD VISIT: 4 weeks  OBJECTIVE:   DIAGNOSTIC FINDINGS: s/p L TKA 12/16/22  PATIENT SURVEYS:  FOTO 55 current, 62 predicted  COGNITION: Overall cognitive status: Within functional limits for tasks assessed     SENSATION: Light touch intact and  symmetrical although pt endorses mild numbness medial calf  EDEMA:  Mild swelling WNL, no eyrthema or drainage noted today  MUSCLE LENGTH: NT  POSTURE: forward flexed trunk, reduced knee ext BIL in static standing  PALPATION: Mild tenderness about L knee joint as expected, no quad/calf tenderness  LOWER EXTREMITY ROM:     Active  Right eval Left eval Left 01/10/23  Hip flexion     Hip extension     Hip internal rotation     Hip external rotation     Knee extension  A: lacking 5 deg A: -4  Knee flexion  A: 103 deg AAROM: 112  (Blank rows = not tested) (Key: WFL = within functional limits not formally assessed, * = concordant pain, s = stiffness/stretching sensation, NT = not tested)  Comments:  LOWER EXTREMITY MMT:    MMT Right eval Left eval  Hip flexion    Hip abduction (modified sitting)    Hip internal rotation    Hip external rotation    Knee flexion    Knee extension    Ankle dorsiflexion     (Blank rows = not tested) (Key: WFL = within functional limits not formally assessed, * = concordant pain, s = stiffness/stretching sensation, NT = not tested)  Comments: deferred given proximity to surgery  LOWER EXTREMITY SPECIAL TESTS:  deferred  FUNCTIONAL TESTS:  5xSTS: 13.22sec UE support first two reps, noted L knee pain TUG: 14.83sec without AD    GAIT: Distance walked: within clinic Assistive device utilized: Quad cane small base Level of assistance: Modified independence Comments: partial step through pattern, reduced L knee ROM throughout all phases of gait, fwd flexed posture   TODAY'S TREATMENT:                 [facility] Adult PT Treatment:                                                DATE: 01/10/23 Therapeutic Exercise: Bike no resistance for ROM full revolutions 4" step ups fwd/lat Lt leading 2x10 10" stacked steps knee flexion stretch Lt x20 Standing TKE GTB Lt 2x10 Seated LAQ with adduction Lt 2x15  Supine QS towel roll Lt 5" hold 2x10 SLR  Lt 2x10 Supine heel slides with strap Lt 2x10 STS 2x10 no UE                                                                                                                [facility] Adult PT Treatment:                                                DATE: 01/05/23 Therapeutic Exercise: Heel slides supine Propped quad set x10 LAQ x5 HEP handout + education     PATIENT EDUCATION:  Education details: Pt education on PT impairments, prognosis, and POC. Informed consent. Rationale for interventions, safe/appropriate HEP performance Person educated: Patient Education method: Explanation, Demonstration, Tactile cues, Verbal cues, and Handouts Education comprehension: verbalized understanding, returned demonstration, verbal cues required, tactile cues required, and needs further education    HOME EXERCISE PROGRAM: Access Code: [email or web address] URL: [email or web address] Date: 01/05/2023 Prepared by: Fransisco Hertz  Exercises - Long Sitting Quad Set with Towel Roll Under Heel  - 2-3 x daily - 7 x weekly - 1 sets - 10 reps - Supine Heel Slide  - 2-3 x daily - 7 x weekly - 1 sets - 10 reps - Seated Long Arc Quad  - 2-3 x daily - 7 x weekly - 1 sets - 10 reps - Sit to Stand  with Armchair  - 2-3 x daily - 7 x weekly - 1 sets - 10 reps  ASSESSMENT:  CLINICAL IMPRESSION: Patient presents to PT reporting moderate pain in his knee and that he has been compliant with his HEP daily. Session today focused on Lt knee ROM and strengthening. His ROM has improved to 4-112 degrees today. Patient was able to tolerate all prescribed exercises with no adverse effects. Patient continues to benefit from skilled PT services and should be progressed as able to improve functional independence.    OBJECTIVE IMPAIRMENTS: Abnormal gait, decreased activity tolerance, decreased balance, decreased endurance, decreased mobility, difficulty walking, decreased ROM, decreased strength, increased edema, improper body  mechanics, postural dysfunction, and pain.   ACTIVITY LIMITATIONS: carrying, lifting, bending, standing, squatting, stairs, transfers, and locomotion level  PARTICIPATION LIMITATIONS: meal prep, cleaning, and laundry  PERSONAL FACTORS: Age, Time since onset of injury/illness/exacerbation, and 3+ comorbidities: HTN, osteoporosis, hx colon cancer  are also affecting patient's functional outcome.   REHAB POTENTIAL: Good  CLINICAL DECISION MAKING: Stable/uncomplicated  EVALUATION COMPLEXITY: Low   GOALS: Goals reviewed with patient? No  SHORT TERM GOALS: Target date: 02/02/2023 Pt will demonstrate appropriate understanding and performance of initially prescribed HEP in order to facilitate improved independence with management of symptoms.  Baseline: HEP provided on eval Goal status: INITIAL   2. Pt will score greater than or equal to 59 on FOTO in order to demonstrate improved perception of function due to symptoms.  Baseline: 55  Goal status: INITIAL    LONG TERM GOALS: Target date: 03/02/2023 Pt will score 62 or greater on FOTO in order to demonstrate improved perception of function due to symptoms.  Baseline: 55 Goal status: INITIAL  2.  Pt will demonstrate at least 0-120 degrees of left knee AROM in order to facilitate improved tolerance to functional movements such as squatting/walking. Baseline: see ROM chart above Goal status: INITIAL  3. Pt will be able to perform TUG in less than or equal to 11sec sec in order to indicate reduced risk of falling (cutoff score for fall risk 13.5 sec in community dwelling older adults per [facility] et al, 2000)  Baseline: 14sec  Goal status: INITIAL    4.  Pt will be able to perform 5xSTS in less than or equal to 11sec without UE support in order to demonstrate reduced fall risk and improved functional independence (MCID 5xSTS = 2.3 sec). Baseline: 13sec UE support first two reps Goal status: INITIAL   5. Pt will demonstrate  symmetrical knee flex/ext MMT in order to facilitate improved functional strength.  Baseline: NT on eval given proximity to surgery  Goal status: INITIAL  6. Pt will report at least 50% decrease in overall pain levels in past week in order to facilitate improved tolerance to basic ADLs/mobility.   Baseline: 0-6/10  Goal status: INITIAL    7. Pt will demonstrate appropriate performance of final prescribed HEP in order to facilitate improved self-management of symptoms post-discharge.   Baseline: initial HEP prescribed  Goal status: INITIAL     PLAN:  PT FREQUENCY: 2x/week  PT DURATION: 8 weeks  PLANNED INTERVENTIONS: Therapeutic exercises, Therapeutic activity, Neuromuscular re-education, Balance training, Gait training, Patient/Family education, Self Care, Joint mobilization, Stair training, Aquatic Therapy, Dry Needling, Cryotherapy, Moist heat, scar mobilization, Taping, Manual therapy, and Re-evaluation  PLAN FOR NEXT SESSION: Review/update HEP PRN. Work on [employer] exercises as appropriate with emphasis on quad activation and muscular control throughout ROM. Symptom modification strategies as indicated/appropriate.  Berta Minor PTA 01/10/2023 10:50 AM    Referring diagnosis? X52.841 (ICD-10-CM) - Status post total left knee replacement Treatment diagnosis? (if different than referring diagnosis) Left knee pain, unspecified chronicity  Stiffness of left knee, not elsewhere classified  Other abnormalities of gait and mobility What was this (referring dx) caused by? [x]  Surgery []  Fall []  Ongoing issue []  Arthritis []  Other: ____________  Laterality: []  Rt [x]  Lt []  Both  Check all possible CPT codes:  *CHOOSE 10 OR LESS*    []  97110 (Therapeutic Exercise)  []  92507 (SLP Treatment)  []  97112 (Neuro Re-ed)   []  92526 (Swallowing Treatment)   []  97116 (Gait Training)   []  [id number] (Cognitive Training, 1st 15 minutes) []  97140 (Manual Therapy)   []  97130  (Cognitive Training, each add'l 15 minutes)  []  97164 (Re-evaluation)                              []  Other, List CPT Code ____________  []  97530 (Therapeutic Activities)     []  97535 (Self Care)   [x]  All codes above (97110 - 97535)  []  97012 (Mechanical Traction)  []  97014 (E-stim Unattended)  []  97032 (E-stim manual)  []  32440 (Ionto)  []  97035 (Ultrasound) [x]  97750 (Physical Performance Training) [x]  [id number] (Aquatic Therapy) []  97016 (Vasopneumatic Device) []  [id number] (Paraffin) []  97034 (Contrast Bath) []  97597 (Wound Care 1st 20 sq cm) []  97598 (Wound Care each add'l 20 sq cm) []  97760 (Orthotic Fabrication, Fitting, Training Initial) []  [id number] (Prosthetic Management and Training Initial) []  [id number] (Orthotic or Prosthetic Training/ Modification Subsequent)

## 2023-01-11 ENCOUNTER — Ambulatory Visit: Payer: Medicare HMO | Admitting: Physician Assistant

## 2023-01-11 ENCOUNTER — Ambulatory Visit: Payer: Medicare HMO

## 2023-01-11 DIAGNOSIS — Z96652 Presence of left artificial knee joint: Secondary | ICD-10-CM | POA: Diagnosis not present

## 2023-01-11 DIAGNOSIS — R2689 Other abnormalities of gait and mobility: Secondary | ICD-10-CM

## 2023-01-11 DIAGNOSIS — M25662 Stiffness of left knee, not elsewhere classified: Secondary | ICD-10-CM | POA: Diagnosis not present

## 2023-01-11 DIAGNOSIS — M25562 Pain in left knee: Secondary | ICD-10-CM | POA: Diagnosis not present

## 2023-01-11 NOTE — Therapy (Signed)
OUTPATIENT PHYSICAL THERAPY TREATMENT NOTE   Patient Name: Robert Rhodes MRN: [id number] DOB:25-Sep-1945, 77 y.o., male Today's Date: 01/11/2023  END OF SESSION:  PT End of Session - 01/11/23 1709     Visit Number 3    Date for PT Re-Evaluation 03/02/23    Authorization Type [employer]    Authorization Time Period 12 visita approved 01/09/23-02/25/23    Authorization - Visit Number 2    Authorization - Number of Visits 12    Progress Note Due on Visit 10    PT Start Time 1705    PT Stop Time 1753    PT Time Calculation (min) 48 min    Activity Tolerance Patient tolerated treatment well    Behavior During Therapy WFL for tasks assessed/performed             Past Medical History:  Diagnosis Date   Adenomatous colon polyp    Arthritis    bilateral knees-needs replacements   Claustrophobia    Colon cancer (HCC) 2015   COVID-19    Dysrhythmia    Eye abnormalities    right eye seen in ED 02/17/2014 wearing eye patch using eye drops   GERD (gastroesophageal reflux disease)    with certain foods   Glaucoma    not on eye drops at this time (10/21/2020)   Hypertension    on meds   Irregular heartbeat    per pt/doesn't know what it does   Osteoarthritis    bilat knees  needs replacement   Osteoporosis    pt denies   Substance abuse (HCC)    stopped over 3 years ago   UC (ulcerative colitis) (HCC)    Vitamin D deficiency    Past Surgical History:  Procedure Laterality Date   APPLICATION OF WOUND VAC  12/16/2022   Procedure: APPLICATION OF WOUND VAC;  Surgeon: Tarry Kos, MD;  Location: [facility];  Service: Orthopedics;;   COLON RESECTION N/A 08/23/2013   Procedure: Laparoscopic total abdominal colectomy and hernia repair;  Surgeon: Romie Levee, MD;  Location: [facility];  Service: General;  Laterality: N/A;   COLON SURGERY  08/2013   COLONOSCOPY  11/02/2020   EUS N/A 07/18/2013   Procedure: UPPER ENDOSCOPIC ULTRASOUND (EUS) RADIAL;  Surgeon: Rachael Fee, MD;   Location: [facility];  Service: Endoscopy;  Laterality: N/A;   EYE SURGERY     FLEXIBLE SIGMOIDOSCOPY N/A 2020   prep good   HERNIA REPAIR  2005   In [place]/put in a mesh   INCISIONAL HERNIA REPAIR N/A 02/20/2014   Procedure: LAP ASSISTED INCISIONAL HERNIA REPAIR LYSIS OF ADHESIONS;  Surgeon: Romie Levee, MD;  Location: [facility];  Service: General;  Laterality: N/A;  converted to open @ 0935   INCISIONAL HERNIA REPAIR N/A 02/20/2014   Procedure: HERNIA REPAIR INCISIONAL;  Surgeon: Romie Levee, MD;  Location: [facility];  Service: General;  Laterality: N/A;  With MESH   INTRAOCULAR LENS INSERTION Bilateral    6 yrs ago   TOTAL KNEE ARTHROPLASTY Right 11/11/2021   Procedure: RIGHT TOTAL KNEE ARTHROPLASTY;  Surgeon: Tarry Kos, MD;  Location: [facility];  Service: Orthopedics;  Laterality: Right;   TOTAL KNEE ARTHROPLASTY Left 12/16/2022   Procedure: LEFT TOTAL KNEE ARTHROPLASTY;  Surgeon: Tarry Kos, MD;  Location: [facility];  Service: Orthopedics;  Laterality: Left;   VENTRAL HERNIA REPAIR     Patient Active Problem List   Diagnosis Date Noted   Status post total left knee  replacement 12/16/2022   Primary osteoarthritis of left knee 10/26/2022   Schizoaffective disorder, bipolar type (HCC) 09/20/2022   Primary osteoarthritis of right knee 11/11/2021   Status post total right knee replacement 11/11/2021   AKI (acute kidney injury) (HCC) 04/21/2019   Pneumonia due to COVID-19 virus 04/21/2019   Osteoporosis    Ulcerative colitis, with rectal bleeding 08/06/2015   Gastroesophageal reflux disease without esophagitis 08/06/2015   Visual impairment of right eye 06/18/2015   Essential hypertension, benign 06/18/2015   Hernia of abdominal cavity 11/27/2014   Bipolar 1 disorder, mixed, moderate (HCC) 11/27/2014   Cellulitis 08/23/2014   Medically noncompliant 08/23/2014   Left leg cellulitis 08/23/2014   Cellulitis of left leg    Hypokalemia    Diarrhea 05/06/2014   Ulcerative colitis (HCC)  04/17/2014   Verrucous keratosis 04/17/2014   Nonspecific (abnormal) findings on radiological and other examination of gastrointestinal tract 07/18/2013   Acute esophagitis 07/18/2013   Hypovitaminosis D 05/06/2013   Ulcerative colitis, unspecified 04/04/2013   Knee pain, bilateral 03/05/2013    PCP: Hoy Register, MD  REFERRING PROVIDER: Tarry Kos, MD  REFERRING DIAG: [id number] (ICD-10-CM) - Status post total left knee replacement  THERAPY DIAG:  Left knee pain, unspecified chronicity  Stiffness of left knee, not elsewhere classified  Other abnormalities of gait and mobility  Rationale for Evaluation and Treatment: Rehabilitation  ONSET DATE: 12/16/22  SUBJECTIVE:   SUBJECTIVE STATEMENT: Patient reports some soreness and tightness from yesterday's session.   PERTINENT HISTORY: HTN, GERD, osteoporosis, s/p L TKA, cancer hx   PAIN:  Are you having pain: 5/10  Location/description: mostly soreness in L knee Best-worst over past week: 0-6/10  - aggravating factors: WB (especially in the morning), bending  - Easing factors: rest, ice    PRECAUTIONS: fall risk   WEIGHT BEARING RESTRICTIONS: No  FALLS:  Has patient fallen in last 6 months? 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, is a Secondary school teacher  PLOF: independent with SPC, uses sock aid for lower body, driving  PATIENT GOALS: get better with movement  NEXT MD VISIT: 4 weeks  OBJECTIVE:   DIAGNOSTIC FINDINGS: s/p L TKA 12/16/22  PATIENT SURVEYS:  FOTO 55 current, 62 predicted  COGNITION: Overall cognitive status: Within functional limits for tasks assessed     SENSATION: Light touch intact and symmetrical although pt endorses mild numbness medial calf  EDEMA:   Mild swelling WNL, no eyrthema or drainage noted today  MUSCLE LENGTH: NT  POSTURE: forward flexed trunk, reduced knee ext BIL in static standing  PALPATION: Mild tenderness about L knee joint as expected, no quad/calf tenderness  LOWER EXTREMITY ROM:     Active  Right eval Left eval Left 01/10/23  Hip flexion     Hip extension     Hip internal rotation     Hip external rotation     Knee extension  A: lacking 5 deg A: -4  Knee flexion  A: 103 deg AAROM: 112  (Blank rows = not tested) (Key: WFL = within functional limits not formally assessed, * = concordant pain, s = stiffness/stretching sensation, NT = not tested)  Comments:    LOWER EXTREMITY MMT:    MMT Right eval Left eval  Hip flexion    Hip  abduction (modified sitting)    Hip internal rotation    Hip external rotation    Knee flexion    Knee extension    Ankle dorsiflexion     (Blank rows = not tested) (Key: WFL = within functional limits not formally assessed, * = concordant pain, s = stiffness/stretching sensation, NT = not tested)  Comments: deferred given proximity to surgery  LOWER EXTREMITY SPECIAL TESTS:  deferred  FUNCTIONAL TESTS:  5xSTS: 13.22sec UE support first two reps, noted L knee pain TUG: 14.83sec without AD    GAIT: Distance walked: within clinic Assistive device utilized: Quad cane small base Level of assistance: Modified independence Comments: partial step through pattern, reduced L knee ROM throughout all phases of gait, fwd flexed posture   TODAY'S TREATMENT:         [facility] Adult PT Treatment:                                                DATE: 01/11/23 Therapeutic Exercise: Bike no resistance for ROM full revolutions 6" step ups fwd/lat Lt leading 2x10 10" stacked steps knee flexion stretch Lt x20 Standing TKE GTB Lt 2x10 Seated LAQ with adduction Lt 2x15  Supine QS towel roll Lt 5" hold 2x10 SLR Lt 2x10 Supine heel slides with strap Lt x10 Modalities: Cold pack x10 mins  anterior knee, pt in sitting  (post session)           [facility] Adult PT Treatment:                                                DATE: 01/10/23 Therapeutic Exercise: Bike no resistance for ROM full revolutions 4" step ups fwd/lat Lt leading 2x10 10" stacked steps knee flexion stretch Lt x20 Standing TKE GTB Lt 2x10 Seated LAQ with adduction Lt 2x15  Supine QS towel roll Lt 5" hold 2x10 SLR Lt 2x10 Supine heel slides with strap Lt 2x10 STS 2x10 no UE                                                                                                                [facility] Adult PT Treatment:                                                DATE: 01/05/23 Therapeutic Exercise: Heel slides supine Propped quad set x10 LAQ x5 HEP handout + education     PATIENT EDUCATION:  Education details: Pt education on PT impairments, prognosis, and POC. Informed consent. Rationale for interventions, safe/appropriate HEP performance Person educated: Patient Education method: Explanation, Demonstration, Tactile cues, Verbal cues, and  Handouts Education comprehension: verbalized understanding, returned demonstration, verbal cues required, tactile cues required, and needs further education    HOME EXERCISE PROGRAM: Access Code: [email or web address] URL: [email or web address] Date: 01/05/2023 Prepared by: Fransisco Hertz  Exercises - Long Sitting Quad Set with Towel Roll Under Heel  - 2-3 x daily - 7 x weekly - 1 sets - 10 reps - Supine Heel Slide  - 2-3 x daily - 7 x weekly - 1 sets - 10 reps - Seated Long Arc Quad  - 2-3 x daily - 7 x weekly - 1 sets - 10 reps - Sit to Stand with Armchair  - 2-3 x daily - 7 x weekly - 1 sets - 10 reps  ASSESSMENT:  CLINICAL IMPRESSION: Patient presents to PT reporting some soreness and tightness from session yesterday, but that his knee feels well overall. Incision is clean, dry, and has steri-strips still in place on lower half of incision. Session today continued to  focus on strengthening ROM for the Lt knee. Patient was able to tolerate all prescribed exercises with no adverse effects. Patient continues to benefit from skilled PT services and should be progressed as able to improve functional independence.    OBJECTIVE IMPAIRMENTS: Abnormal gait, decreased activity tolerance, decreased balance, decreased endurance, decreased mobility, difficulty walking, decreased ROM, decreased strength, increased edema, improper body mechanics, postural dysfunction, and pain.   ACTIVITY LIMITATIONS: carrying, lifting, bending, standing, squatting, stairs, transfers, and locomotion level  PARTICIPATION LIMITATIONS: meal prep, cleaning, and laundry  PERSONAL FACTORS: Age, Time since onset of injury/illness/exacerbation, and 3+ comorbidities: HTN, osteoporosis, hx colon cancer  are also affecting patient's functional outcome.   REHAB POTENTIAL: Good  CLINICAL DECISION MAKING: Stable/uncomplicated  EVALUATION COMPLEXITY: Low   GOALS: Goals reviewed with patient? No  SHORT TERM GOALS: Target date: 02/02/2023 Pt will demonstrate appropriate understanding and performance of initially prescribed HEP in order to facilitate improved independence with management of symptoms.  Baseline: HEP provided on eval Goal status: INITIAL   2. Pt will score greater than or equal to 59 on FOTO in order to demonstrate improved perception of function due to symptoms.  Baseline: 55  Goal status: INITIAL    LONG TERM GOALS: Target date: 03/02/2023 Pt will score 62 or greater on FOTO in order to demonstrate improved perception of function due to symptoms.  Baseline: 55 Goal status: INITIAL  2.  Pt will demonstrate at least 0-120 degrees of left knee AROM in order to facilitate improved tolerance to functional movements such as squatting/walking. Baseline: see ROM chart above Goal status: INITIAL  3. Pt will be able to perform TUG in less than or equal to 11sec sec in order to  indicate reduced risk of falling (cutoff score for fall risk 13.5 sec in community dwelling older adults per [facility] et al, 2000)  Baseline: 14sec  Goal status: INITIAL    4.  Pt will be able to perform 5xSTS in less than or equal to 11sec without UE support in order to demonstrate reduced fall risk and improved functional independence (MCID 5xSTS = 2.3 sec). Baseline: 13sec UE support first two reps Goal status: INITIAL   5. Pt will demonstrate symmetrical knee flex/ext MMT in order to facilitate improved functional strength.  Baseline: NT on eval given proximity to surgery  Goal status: INITIAL  6. Pt will report at least 50% decrease in overall pain levels in past week in order to facilitate improved tolerance to basic ADLs/mobility.   Baseline: 0-6/10  Goal status: INITIAL  7. Pt will demonstrate appropriate performance of final prescribed HEP in order to facilitate improved self-management of symptoms post-discharge.   Baseline: initial HEP prescribed  Goal status: INITIAL     PLAN:  PT FREQUENCY: 2x/week  PT DURATION: 8 weeks  PLANNED INTERVENTIONS: Therapeutic exercises, Therapeutic activity, Neuromuscular re-education, Balance training, Gait training, Patient/Family education, Self Care, Joint mobilization, Stair training, Aquatic Therapy, Dry Needling, Cryotherapy, Moist heat, scar mobilization, Taping, Manual therapy, and Re-evaluation  PLAN FOR NEXT SESSION: Review/update HEP PRN. Work on [employer] exercises as appropriate with emphasis on quad activation and muscular control throughout ROM. Symptom modification strategies as indicated/appropriate.    Berta Minor PTA 01/11/2023 5:44 PM    Referring diagnosis? W09.811 (ICD-10-CM) - Status post total left knee replacement Treatment diagnosis? (if different than referring diagnosis) Left knee pain, unspecified chronicity  Stiffness of left knee, not elsewhere classified  Other abnormalities of gait and  mobility What was this (referring dx) caused by? [x]  Surgery []  Fall []  Ongoing issue []  Arthritis []  Other: ____________  Laterality: []  Rt [x]  Lt []  Both  Check all possible CPT codes:  *CHOOSE 10 OR LESS*    []  97110 (Therapeutic Exercise)  []  92507 (SLP Treatment)  []  97112 (Neuro Re-ed)   []  92526 (Swallowing Treatment)   []  97116 (Gait Training)   []  [id number] (Cognitive Training, 1st 15 minutes) []  97140 (Manual Therapy)   []  97130 (Cognitive Training, each add'l 15 minutes)  []  97164 (Re-evaluation)                              []  Other, List CPT Code ____________  []  97530 (Therapeutic Activities)     []  97535 (Self Care)   [x]  All codes above (97110 - 97535)  []  97012 (Mechanical Traction)  []  97014 (E-stim Unattended)  []  97032 (E-stim manual)  []  97033 (Ionto)  []  97035 (Ultrasound) [x]  97750 (Physical Performance Training) [x]  [id number] (Aquatic Therapy) []  97016 (Vasopneumatic Device) []  [id number] (Paraffin) []  97034 (Contrast Bath) []  97597 (Wound Care 1st 20 sq cm) []  97598 (Wound Care each add'l 20 sq cm) []  97760 (Orthotic Fabrication, Fitting, Training Initial) []  91478 (Prosthetic Management and Training Initial) []  [id number] (Orthotic or Prosthetic Training/ Modification Subsequent)

## 2023-01-17 ENCOUNTER — Encounter: Payer: Self-pay | Admitting: Physician Assistant

## 2023-01-17 ENCOUNTER — Ambulatory Visit (INDEPENDENT_AMBULATORY_CARE_PROVIDER_SITE_OTHER): Payer: Medicare HMO | Admitting: Physician Assistant

## 2023-01-17 VITALS — BP 110/72 | HR 91 | Ht 69.0 in | Wt 188.6 lb

## 2023-01-17 DIAGNOSIS — Z8719 Personal history of other diseases of the digestive system: Secondary | ICD-10-CM

## 2023-01-17 DIAGNOSIS — R638 Other symptoms and signs concerning food and fluid intake: Secondary | ICD-10-CM

## 2023-01-17 DIAGNOSIS — Z9049 Acquired absence of other specified parts of digestive tract: Secondary | ICD-10-CM

## 2023-01-17 DIAGNOSIS — R1310 Dysphagia, unspecified: Secondary | ICD-10-CM

## 2023-01-17 DIAGNOSIS — K219 Gastro-esophageal reflux disease without esophagitis: Secondary | ICD-10-CM

## 2023-01-17 NOTE — Progress Notes (Signed)
Subjective:    Patient ID: Robert Rhodes, male    DOB: 1946-02-28, 77 y.o.   MRN: [id number]  HPI Robert Rhodes is a pleasant 77 year old male, established with Dr. Rhea Belton last seen in May 2022 when he underwent flexible sigmoidoscopy for follow-up of ulcerative colitis in setting of subtotal colectomy. He comes in today to discuss repeat flexible sigmoidoscopy and also has concerns about dysphagia. At last Flexin May 2022 there was evidence of prior end-to-side ileal rectal anastomosis in the proximal rectum with healthy mucosa, ileum within normal limits, there was moderate inflammation in a continuous and circumferential pattern/Mayo score 2 from the rectum to the TI.  There was an 8 mm polyp removed from the proximal rectum. Biopsy showed inflamed mucosa and exudate and granulation tissue consistent with ulcer from both the rectal biopsies and the polyp biopsy.  He was continued on Canasa suppository at bedtime. He says he has not been using the Canasa suppositories over the past year, he has not been having any colitis type symptoms denies any problems with mucoid discharge or bleeding, no abdominal or rectal pain, no issues with diarrhea.  He says that he had been having GERD symptoms about 5 or 6 months ago much improved since he significantly changed his diet.  He is following an antireflux regimen and eats a vegetarian diet. Around that same time he started noticing some difficulty with dysphagia and though symptoms have continued to though he is not having any heartburn or indigestion.  He has had some episodes of food sticking in his esophagus for prolonged periods of time.  He says the last episode about 3 weeks ago was after he had eaten vegetables which felt like they sat for 2 to 3 hours and he was eventually able to regurgitate.  He has not had any bad episodes since but has had similar episodes prior to that over the past 6 months.  He still has a sensation of food not traversing well  since then. He did have EGD in 2016 with finding of a small hiatal hernia no stricture noted.  Other medical problems include hypertension, bipolar disorder, he had echo in May 2023 with grade 1 diastolic dysfunction EF of 55 to 60% and no AAS. He did undergo left knee replacement on December 16, 2022, he was on Xarelto for several weeks which has now been discontinued.  Review of Systems. Pertinent positive and negative review of systems were noted in the above HPI section.  All other review of systems was otherwise negative.   Outpatient Encounter Medications as of 01/17/2023  Medication Sig   amLODipine (NORVASC) 5 MG tablet TAKE 1 TABLET BY MOUTH ONCE DAILY- HYPERTENSION   MAGNESIUM PO Take 1 tablet by mouth daily.   POTASSIUM PO Take 1 tablet by mouth daily.   atorvastatin (LIPITOR) 20 MG tablet Take 1 tablet (20 mg total) by mouth daily. (Patient not taking: Reported on 01/17/2023)   docusate sodium (COLACE) 100 MG capsule Take 1 capsule (100 mg total) by mouth daily as needed. (Patient not taking: Reported on 01/17/2023)   Homeopathic Products (ARNICA EX) Apply 1 Application topically daily as needed (bruising). (Patient not taking: Reported on 01/17/2023)   methocarbamol (ROBAXIN-750) 750 MG tablet Take 1 tablet (750 mg total) by mouth 2 (two) times daily as needed for muscle spasms. (Patient not taking: Reported on 01/17/2023)   ondansetron (ZOFRAN) 4 MG tablet Take 1 tablet (4 mg total) by mouth every 8 (eight) hours as needed for nausea  or vomiting. (Patient not taking: Reported on 01/17/2023)   oxyCODONE-acetaminophen (PERCOCET) 5-325 MG tablet Take 1-2 tablets by mouth 3 (three) times daily as needed. (Patient not taking: Reported on 01/17/2023)   propranolol (INDERAL) 10 MG tablet Take 10 mg by mouth daily as needed (claustrophobia). (Patient not taking: Reported on 01/17/2023)   rivaroxaban (XARELTO) 10 MG TABS tablet Take 1 tablet (10 mg total) by mouth daily. To be taken after surgery to prevent  blood clots (Patient not taking: Reported on 01/17/2023)   VITAMIN D PO Take 1 tablet by mouth 2 (two) times a week. (Patient not taking: Reported on 01/17/2023)   No facility-administered encounter medications on file as of 01/17/2023.   Allergies  Allergen Reactions   Ace Inhibitors Other (See Comments)    Unknown reaction- "affecting my breathing in some kind of way"   Advil [Ibuprofen] Other (See Comments)    Muscle tightness   Aspirin Nausea And Vomiting   Tylenol [Acetaminophen] Other (See Comments)    Muscle tightness   Patient Active Problem List   Diagnosis Date Noted   Status post total left knee replacement 12/16/2022   Primary osteoarthritis of left knee 10/26/2022   Schizoaffective disorder, bipolar type (HCC) 09/20/2022   Primary osteoarthritis of right knee 11/11/2021   Status post total right knee replacement 11/11/2021   AKI (acute kidney injury) (HCC) 04/21/2019   Pneumonia due to COVID-19 virus 04/21/2019   Osteoporosis    Ulcerative colitis, with rectal bleeding 08/06/2015   Gastroesophageal reflux disease without esophagitis 08/06/2015   Visual impairment of right eye 06/18/2015   Essential hypertension, benign 06/18/2015   Hernia of abdominal cavity 11/27/2014   Bipolar 1 disorder, mixed, moderate (HCC) 11/27/2014   Cellulitis 08/23/2014   Medically noncompliant 08/23/2014   Left leg cellulitis 08/23/2014   Cellulitis of left leg    Hypokalemia    Diarrhea 05/06/2014   Ulcerative colitis (HCC) 04/17/2014   Verrucous keratosis 04/17/2014   Nonspecific (abnormal) findings on radiological and other examination of gastrointestinal tract 07/18/2013   Acute esophagitis 07/18/2013   Hypovitaminosis D 05/06/2013   Ulcerative colitis, unspecified 04/04/2013   Knee pain, bilateral 03/05/2013   Social History   Socioeconomic History   Marital status: Single    Spouse name: Not on file   Number of children: 0   Years of education: Not on file   Highest  education level: GED or equivalent  Occupational History   Occupation: retired  Tobacco Use   Smoking status: Former    Current packs/day: 0.00    Types: Cigarettes    Quit date: 06/14/2003    Years since quitting: 19.6   Smokeless tobacco: Never   Tobacco comments:    quit in July 2005  Vaping Use   Vaping status: Never Used  Substance and Sexual Activity   Alcohol use: No   Drug use: Not Currently    Types: 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  Other Topics Concern   Not on file  Social History Narrative   Not on file   Social Determinants of Health   Financial Resource Strain: Medium Risk (10/26/2022)   Overall Financial Resource Strain (CARDIA)    Difficulty of Paying Living Expenses: Somewhat hard  Food Insecurity: Food Insecurity Present (10/26/2022)   Hunger Vital Sign    Worried About Running Out of Food in the Last Year: Sometimes true    Ran Out of Food in the Last  Year: Sometimes true  Transportation Needs: No Transportation Needs (10/26/2022)   PRAPARE - Administrator, Civil Service (Medical): No    Lack of Transportation (Non-Medical): No  Recent Concern: Transportation Needs - Unmet Transportation Needs (09/16/2022)   PRAPARE - Transportation    Lack of Transportation (Medical): No    Lack of Transportation (Non-Medical): Yes  Physical Activity: Sufficiently Active (10/26/2022)   Exercise Vital Sign    Days of Exercise per Week: 5 days    Minutes of Exercise per Session: 30 min  Stress: No Stress Concern Present (10/26/2022)   [facility] [employer] - Occupational Stress Questionnaire    Feeling of Stress : Not at all  Social Connections: Moderately Isolated (10/26/2022)   Social Connection and Isolation Panel [NHANES]    Frequency of Communication with Friends and Family: Three times a week    Frequency of Social Gatherings with Friends and Family: Once a week    Attends Religious Services:  Never    Database administrator or Organizations: Yes    Attends Engineer, structural: More than 4 times per year    Marital Status: Never married  Intimate Partner Violence: Not At Risk (10/26/2022)   Humiliation, Afraid, Rape, and Kick questionnaire    Fear of Current or Ex-Partner: No    Emotionally Abused: No    Physically Abused: No    Sexually Abused: No    Mr. Koob family history includes Cancer in his mother; Diabetes in his father; Healthy in his sister; Heart attack in his brother; Heart disease in his father and sister.      Objective:    Vitals:   01/17/23 1334  BP: 110/72  Pulse: 91    Physical Exam Well-developed well-nourished elderly AA male  in no acute distress.Pleasant   Height, Weight 188 , BMI 27.8  HEENT; nontraumatic normocephalic, EOMI, PE R LA, sclera anicteric. Oropharynx;not examined Neck; supple, no JVD Cardiovascular; regular rate and rhythm with S1-S2, no murmur rub or gallop Pulmonary; Clear bilaterally Abdomen; soft, nontender, nondistended, no palpable mass or hepatosplenomegaly, bowel sounds are active Rectal;not done today  Skin; benign exam, no jaundice rash or appreciable lesions Extremities; no clubbing cyanosis or edema skin warm and dry-very recent left knee replacement Neuro/Psych; alert and oriented x4, grossly nonfocal mood and affect appropriate        Assessment & Plan:   #73 76 year old male with history of ulcerative colitis status post subtotal colectomy with ileorectal anastomosis 2015. Patient has had evidence of persistent moderate disease of the rectum since with last flex done May 2022/Mayo score 2.  He has not been using Canasa over the past year and is currently asymptomatic  Due for follow-up flexible sigmoidoscopy  #2 history of GERD, not currently on any medical therapy, has been controlling with diet, now with new symptom of intermittent solid food dysphagia with an episode of what sounds like  transient food impaction about 3 weeks ago with symptoms lasting 2 to 3 hours, eventually able to regurgitate.  He has had intermittent similar episodes prior to 3 weeks ago, and has been having frequent sensation of vague dysphagia. Rule out peptic stricture, Schatzki's ring, doubt neoplasm but cannot rule out.  #3 recent left knee replacement 12/16/2022 #4 hypertension #5  Bipolar disorder  Plan; patient will be scheduled for flexible sigmoidoscopy and EGD with probable dilation with Dr. [name].  Dr. Rhea Belton did not have availability until mid October and patient did not want  to wait that long. Both procedures were discussed in detail with the patient including indications risk benefits and he is agreeable to proceed. Half of the MiraLAX prep to be given, Further recommendations pending results at endoscopic evaluation.  Krithik Mapel S Zoeann Mol PA-C 01/17/2023   Cc: Hoy Register, MD

## 2023-01-17 NOTE — Patient Instructions (Signed)
_______________________________________________________  If your blood pressure at your visit was 140/90 or greater, please contact your primary care physician to follow up on this.  _______________________________________________________  If you are age 79 or older, your body mass index should be between 23-30. Your Body mass index is 27.85 kg/m. If this is out of the aforementioned range listed, please consider follow up with your Primary Care Provider.  If you are age 29 or younger, your body mass index should be between 19-25. Your Body mass index is 27.85 kg/m. If this is out of the aformentioned range listed, please consider follow up with your Primary Care Provider.   ________________________________________________________  The [facility] providers would like to encourage you to use [facility] to communicate with providers for non-urgent requests or questions.  Due to long hold times on the telephone, sending your provider a message by [facility] may be a faster and more efficient way to get a response.  Please allow 48 business hours for a response.  Please remember that this is for non-urgent requests.   You have been scheduled for a flexible sigmoidoscopy. Please follow the written instructions given to you at your visit today.  If you use inhalers (even only as needed), please bring them with you on the day of your procedure.  DO NOT TAKE 7 DAYS PRIOR TO TEST- Trulicity (dulaglutide) Ozempic, Wegovy (semaglutide) Mounjaro (tirzepatide) Bydureon Bcise (exanatide extended release)  DO NOT TAKE 1 DAY PRIOR TO YOUR TEST Rybelsus (semaglutide) Adlyxin (lixisenatide) Victoza (liraglutide) Byetta (exanatide) ___________________________________________________________________________   Due to recent changes in healthcare laws, you may see the results of your imaging and laboratory studies on MyChart before your provider has had a chance to review them.  We understand that in some  cases there may be results that are confusing or concerning to you. Not all laboratory results come back in the same time frame and the provider may be waiting for multiple results in order to interpret others.  Please give [place] 48 hours in order for your provider to thoroughly review all the results before contacting the office for clarification of your results.   It was a pleasure to see you today!  Thank you for trusting me with your gastrointestinal care!     _______________________________________________________

## 2023-01-17 NOTE — Progress Notes (Signed)
I agree with the assessment and plan as outlined by Ms. Esterwood. ?

## 2023-01-18 ENCOUNTER — Encounter: Payer: Medicare HMO | Admitting: Physical Therapy

## 2023-01-18 ENCOUNTER — Ambulatory Visit: Payer: Medicare HMO | Attending: Orthopaedic Surgery

## 2023-01-18 DIAGNOSIS — M25562 Pain in left knee: Secondary | ICD-10-CM | POA: Insufficient documentation

## 2023-01-18 DIAGNOSIS — R2689 Other abnormalities of gait and mobility: Secondary | ICD-10-CM | POA: Insufficient documentation

## 2023-01-18 DIAGNOSIS — M25662 Stiffness of left knee, not elsewhere classified: Secondary | ICD-10-CM | POA: Diagnosis not present

## 2023-01-18 NOTE — Therapy (Signed)
OUTPATIENT PHYSICAL THERAPY TREATMENT NOTE   Patient Name: Robert Rhodes MRN: [id number] DOB:March 26, 1946, 77 y.o., male Today's Date: 01/18/2023  END OF SESSION:  PT End of Session - 01/18/23 1305     Visit Number 4    Date for PT Re-Evaluation 03/02/23    Authorization Type [employer]    Authorization Time Period 12 visita approved 01/09/23-02/25/23    Authorization - Visit Number 3    Authorization - Number of Visits 12    Progress Note Due on Visit 10    PT Start Time 1304    PT Stop Time 1353    PT Time Calculation (min) 49 min    Activity Tolerance Patient tolerated treatment well    Behavior During Therapy WFL for tasks assessed/performed              Past Medical History:  Diagnosis Date   Adenomatous colon polyp    Arthritis    bilateral knees-needs replacements   Claustrophobia    Colon cancer (HCC) 2015   COVID-19    Dysrhythmia    Eye abnormalities    right eye seen in ED 02/17/2014 wearing eye patch using eye drops   GERD (gastroesophageal reflux disease)    with certain foods   Glaucoma    not on eye drops at this time (10/21/2020)   Hypertension    on meds   Irregular heartbeat    per pt/doesn't know what it does   Osteoarthritis    bilat knees  needs replacement   Osteoporosis    pt denies   Substance abuse (HCC)    stopped over 3 years ago   UC (ulcerative colitis) (HCC)    Vitamin D deficiency    Past Surgical History:  Procedure Laterality Date   APPLICATION OF WOUND VAC  12/16/2022   Procedure: APPLICATION OF WOUND VAC;  Surgeon: Tarry Kos, MD;  Location: [facility];  Service: Orthopedics;;   COLON RESECTION N/A 08/23/2013   Procedure: Laparoscopic total abdominal colectomy and hernia repair;  Surgeon: Romie Levee, MD;  Location: [facility];  Service: General;  Laterality: N/A;   COLON SURGERY  08/2013   COLONOSCOPY  11/02/2020   EUS N/A 07/18/2013   Procedure: UPPER ENDOSCOPIC ULTRASOUND (EUS) RADIAL;  Surgeon: Rachael Fee, MD;   Location: [facility];  Service: Endoscopy;  Laterality: N/A;   EYE SURGERY     FLEXIBLE SIGMOIDOSCOPY N/A 2020   prep good   HERNIA REPAIR  2005   In [place]/put in a mesh   INCISIONAL HERNIA REPAIR N/A 02/20/2014   Procedure: LAP ASSISTED INCISIONAL HERNIA REPAIR LYSIS OF ADHESIONS;  Surgeon: Romie Levee, MD;  Location: [facility];  Service: General;  Laterality: N/A;  converted to open @ 0935   INCISIONAL HERNIA REPAIR N/A 02/20/2014   Procedure: HERNIA REPAIR INCISIONAL;  Surgeon: Romie Levee, MD;  Location: [facility];  Service: General;  Laterality: N/A;  With MESH   INTRAOCULAR LENS INSERTION Bilateral    6 yrs ago   TOTAL KNEE ARTHROPLASTY Right 11/11/2021   Procedure: RIGHT TOTAL KNEE ARTHROPLASTY;  Surgeon: Tarry Kos, MD;  Location: [facility];  Service: Orthopedics;  Laterality: Right;   TOTAL KNEE ARTHROPLASTY Left 12/16/2022   Procedure: LEFT TOTAL KNEE ARTHROPLASTY;  Surgeon: Tarry Kos, MD;  Location: [facility];  Service: Orthopedics;  Laterality: Left;   VENTRAL HERNIA REPAIR     Patient Active Problem List   Diagnosis Date Noted   Status post total left  knee replacement 12/16/2022   Primary osteoarthritis of left knee 10/26/2022   Schizoaffective disorder, bipolar type (HCC) 09/20/2022   Primary osteoarthritis of right knee 11/11/2021   Status post total right knee replacement 11/11/2021   AKI (acute kidney injury) (HCC) 04/21/2019   Pneumonia due to COVID-19 virus 04/21/2019   Osteoporosis    Ulcerative colitis, with rectal bleeding 08/06/2015   Gastroesophageal reflux disease without esophagitis 08/06/2015   Visual impairment of right eye 06/18/2015   Essential hypertension, benign 06/18/2015   Hernia of abdominal cavity 11/27/2014   Bipolar 1 disorder, mixed, moderate (HCC) 11/27/2014   Cellulitis 08/23/2014   Medically noncompliant 08/23/2014   Left leg cellulitis 08/23/2014   Cellulitis of left leg    Hypokalemia    Diarrhea 05/06/2014   Ulcerative colitis (HCC)  04/17/2014   Verrucous keratosis 04/17/2014   Nonspecific (abnormal) findings on radiological and other examination of gastrointestinal tract 07/18/2013   Acute esophagitis 07/18/2013   Hypovitaminosis D 05/06/2013   Ulcerative colitis, unspecified 04/04/2013   Knee pain, bilateral 03/05/2013    PCP: Hoy Register, MD  REFERRING PROVIDER: Tarry Kos, MD  REFERRING DIAG: [phone] (ICD-10-CM) - Status post total left knee replacement  THERAPY DIAG:  Left knee pain, unspecified chronicity  Stiffness of left knee, not elsewhere classified  Other abnormalities of gait and mobility  Rationale for Evaluation and Treatment: Rehabilitation  ONSET DATE: 12/16/22  SUBJECTIVE:   SUBJECTIVE STATEMENT: Patient reports mild knee pain today and that he feels like this knee tends to buckle more than his first replacement.   PERTINENT HISTORY: HTN, GERD, osteoporosis, s/p L TKA, cancer hx   PAIN:  Are you having pain: 4/10  Location/description: mostly soreness in L knee Best-worst over past week: 0-6/10  - aggravating factors: WB (especially in the morning), bending  - Easing factors: rest, ice    PRECAUTIONS: fall risk   WEIGHT BEARING RESTRICTIONS: No  FALLS:  Has patient fallen in last 6 months? 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, is a Secondary school teacher  PLOF: independent with SPC, uses sock aid for lower body, driving  PATIENT GOALS: get better with movement  NEXT MD VISIT: 4 weeks  OBJECTIVE:   DIAGNOSTIC FINDINGS: s/p L TKA 12/16/22  PATIENT SURVEYS:  FOTO 55 current, 62 predicted  COGNITION: Overall cognitive status: Within functional limits for tasks assessed     SENSATION: Light touch intact and symmetrical although  pt endorses mild numbness medial calf  EDEMA:  Mild swelling WNL, no eyrthema or drainage noted today  MUSCLE LENGTH: NT  POSTURE: forward flexed trunk, reduced knee ext BIL in static standing  PALPATION: Mild tenderness about L knee joint as expected, no quad/calf tenderness  LOWER EXTREMITY ROM:     Active  Right eval Left eval Left 01/10/23 Left 01/18/23  Hip flexion      Hip extension      Hip internal rotation      Hip external rotation      Knee extension  A: lacking 5 deg A: -4   Knee flexion  A: 103 deg AAROM: 112 A: 110  (Blank rows = not tested) (Key: WFL = within functional limits not formally assessed, * = concordant pain, s = stiffness/stretching sensation, NT = not tested)  Comments:    LOWER EXTREMITY MMT:    MMT Right eval Left eval  Hip flexion    Hip abduction (modified sitting)    Hip internal rotation    Hip external rotation    Knee flexion    Knee extension    Ankle dorsiflexion     (Blank rows = not tested) (Key: WFL = within functional limits not formally assessed, * = concordant pain, s = stiffness/stretching sensation, NT = not tested)  Comments: deferred given proximity to surgery  LOWER EXTREMITY SPECIAL TESTS:  deferred  FUNCTIONAL TESTS:  5xSTS: 13.22sec UE support first two reps, noted L knee pain TUG: 14.83sec without AD    GAIT: Distance walked: within clinic Assistive device utilized: Quad cane small base Level of assistance: Modified independence Comments: partial step through pattern, reduced L knee ROM throughout all phases of gait, fwd flexed posture   TODAY'S TREATMENT:         [facility] Adult PT Treatment:                                                DATE: 01/18/23 Therapeutic Exercise: Bike no resistance for ROM full revolutions 6" step ups fwd/lat Lt leading 2x10 10" stacked steps knee flexion stretch Lt x20 Standing TKE BlueTB Lt 2x10 Seated LAQ with adduction Lt 2# 2x10  SLR Lt with QS 2x10 (slight quad lag with  second set) Seated heel slides Lt 2x10 LLLD stretch heel propped 4# above knee x2' Modalities: Cold pack x10 mins anterior knee, pt in sitting (post session)   [facility] Adult PT Treatment:                                                DATE: 01/11/23 Therapeutic Exercise: Bike no resistance for ROM full revolutions 6" step ups fwd/lat Lt leading 2x10 10" stacked steps knee flexion stretch Lt x20 Standing TKE GTB Lt 2x10 Seated LAQ with adduction Lt 2x15  Supine QS towel roll Lt 5" hold 2x10 SLR Lt 2x10 Supine heel slides with strap Lt x10 Modalities: Cold pack x10 mins anterior knee, pt in sitting  (post session)           [facility] Adult PT Treatment:                                                DATE: 01/10/23 Therapeutic Exercise: Bike no resistance for ROM full revolutions 4" step ups fwd/lat Lt leading 2x10 10" stacked steps knee flexion stretch Lt x20 Standing TKE GTB Lt 2x10 Seated LAQ with adduction Lt 2x15  Supine QS towel roll Lt 5" hold 2x10 SLR Lt 2x10 Supine heel slides with strap Lt 2x10 STS 2x10 no UE     PATIENT EDUCATION:  Education details: Pt education on PT impairments, prognosis, and POC. Informed consent. Rationale for interventions, safe/appropriate HEP performance Person educated: Patient Education method: Explanation, Demonstration, Tactile cues, Verbal cues, and Handouts Education comprehension: verbalized understanding, returned demonstration, verbal cues required, tactile cues required, and needs further education    HOME EXERCISE PROGRAM: Access Code: [email or web address] URL: [email or web address] Date:  01/05/2023 Prepared by: Fransisco Hertz  Exercises - Long Sitting Quad Set with Towel Roll Under Heel  - 2-3 x daily - 7 x weekly - 1 sets - 10 reps - Supine Heel Slide  - 2-3 x daily - 7 x weekly - 1 sets - 10 reps - Seated Long Arc Quad  - 2-3 x daily - 7 x weekly - 1 sets - 10 reps - Sit to Stand with Armchair  - 2-3 x daily - 7 x weekly - 1  sets - 10 reps  ASSESSMENT:  CLINICAL IMPRESSION: Patient presents to PT reporting mild soreness in his Lt knee and that he notices more buckling in this knee than the first replacement. Session today continued to focus on quad strengthening and knee ROM in both flexion and extension. He has a slight quad lag with second set of SLR today. Patient was able to tolerate all prescribed exercises with no adverse effects. Patient continues to benefit from skilled PT services and should be progressed as able to improve functional independence.     OBJECTIVE IMPAIRMENTS: Abnormal gait, decreased activity tolerance, decreased balance, decreased endurance, decreased mobility, difficulty walking, decreased ROM, decreased strength, increased edema, improper body mechanics, postural dysfunction, and pain.   ACTIVITY LIMITATIONS: carrying, lifting, bending, standing, squatting, stairs, transfers, and locomotion level  PARTICIPATION LIMITATIONS: meal prep, cleaning, and laundry  PERSONAL FACTORS: Age, Time since onset of injury/illness/exacerbation, and 3+ comorbidities: HTN, osteoporosis, hx colon cancer  are also affecting patient's functional outcome.   REHAB POTENTIAL: Good  CLINICAL DECISION MAKING: Stable/uncomplicated  EVALUATION COMPLEXITY: Low   GOALS: Goals reviewed with patient? No  SHORT TERM GOALS: Target date: 02/02/2023 Pt will demonstrate appropriate understanding and performance of initially prescribed HEP in order to facilitate improved independence with management of symptoms.  Baseline: HEP provided on eval Goal status: INITIAL   2. Pt will score greater than or equal to 59 on FOTO in order to demonstrate improved perception of function due to symptoms.  Baseline: 55  Goal status: INITIAL   LONG TERM GOALS: Target date: 03/02/2023 Pt will score 62 or greater on FOTO in order to demonstrate improved perception of function due to symptoms.  Baseline: 55 Goal status:  INITIAL  2.  Pt will demonstrate at least 0-120 degrees of left knee AROM in order to facilitate improved tolerance to functional movements such as squatting/walking. Baseline: see ROM chart above Goal status: INITIAL  3. Pt will be able to perform TUG in less than or equal to 11sec sec in order to indicate reduced risk of falling (cutoff score for fall risk 13.5 sec in community dwelling older adults per [facility] et al, 2000)  Baseline: 14sec  Goal status: INITIAL    4.  Pt will be able to perform 5xSTS in less than or equal to 11sec without UE support in order to demonstrate reduced fall risk and improved functional independence (MCID 5xSTS = 2.3 sec). Baseline: 13sec UE support first two reps Goal status: INITIAL   5. Pt will demonstrate symmetrical knee flex/ext MMT in order to facilitate improved functional strength.  Baseline: NT on eval given proximity to surgery  Goal status: INITIAL  6. Pt will report at least 50% decrease in overall pain levels in past week in order to facilitate improved tolerance to basic ADLs/mobility.   Baseline: 0-6/10  Goal status: INITIAL    7. Pt will demonstrate appropriate performance of final prescribed HEP in order to facilitate improved self-management of symptoms post-discharge.  Baseline: initial HEP prescribed  Goal status: INITIAL     PLAN:  PT FREQUENCY: 2x/week  PT DURATION: 8 weeks  PLANNED INTERVENTIONS: Therapeutic exercises, Therapeutic activity, Neuromuscular re-education, Balance training, Gait training, Patient/Family education, Self Care, Joint mobilization, Stair training, Aquatic Therapy, Dry Needling, Cryotherapy, Moist heat, scar mobilization, Taping, Manual therapy, and Re-evaluation  PLAN FOR NEXT SESSION: Review/update HEP PRN. Work on [employer] exercises as appropriate with emphasis on quad activation and muscular control throughout ROM. Symptom modification strategies as indicated/appropriate.    Berta Minor PTA 01/18/2023 1:57 PM    Referring diagnosis? J47.829 (ICD-10-CM) - Status post total left knee replacement Treatment diagnosis? (if different than referring diagnosis) Left knee pain, unspecified chronicity  Stiffness of left knee, not elsewhere classified  Other abnormalities of gait and mobility What was this (referring dx) caused by? [x]  Surgery []  Fall []  Ongoing issue []  Arthritis []  Other: ____________  Laterality: []  Rt [x]  Lt []  Both  Check all possible CPT codes:  *CHOOSE 10 OR LESS*    []  97110 (Therapeutic Exercise)  []  92507 (SLP Treatment)  []  97112 (Neuro Re-ed)   []  92526 (Swallowing Treatment)   []  97116 (Gait Training)   []  [id number] (Cognitive Training, 1st 15 minutes) []  97140 (Manual Therapy)   []  56213 (Cognitive Training, each add'l 15 minutes)  []  97164 (Re-evaluation)                              []  Other, List CPT Code ____________  []  97530 (Therapeutic Activities)     []  97535 (Self Care)   [x]  All codes above (97110 - 97535)  []  97012 (Mechanical Traction)  []  97014 (E-stim Unattended)  []  97032 (E-stim manual)  []  97033 (Ionto)  []  97035 (Ultrasound) [x]  97750 (Physical Performance Training) [x]  [id number] (Aquatic Therapy) []  97016 (Vasopneumatic Device) []  [id number] (Paraffin) []  97034 (Contrast Bath) []  97597 (Wound Care 1st 20 sq cm) []  97598 (Wound Care each add'l 20 sq cm) []  97760 (Orthotic Fabrication, Fitting, Training Initial) []  [id number] (Prosthetic Management and Training Initial) []  [id number] (Orthotic or Prosthetic Training/ Modification Subsequent)

## 2023-01-23 ENCOUNTER — Ambulatory Visit: Payer: Medicare HMO

## 2023-01-23 DIAGNOSIS — M25562 Pain in left knee: Secondary | ICD-10-CM | POA: Diagnosis not present

## 2023-01-23 DIAGNOSIS — M25662 Stiffness of left knee, not elsewhere classified: Secondary | ICD-10-CM

## 2023-01-23 DIAGNOSIS — R2689 Other abnormalities of gait and mobility: Secondary | ICD-10-CM | POA: Diagnosis not present

## 2023-01-23 NOTE — Therapy (Signed)
OUTPATIENT PHYSICAL THERAPY TREATMENT NOTE   Patient Name: Robert Rhodes MRN: [id number] DOB:22-May-1946, 77 y.o., male Today's Date: 01/23/2023  END OF SESSION:  PT End of Session - 01/23/23 1050     Visit Number 5    Date for PT Re-Evaluation 03/02/23    Authorization Type [employer]    Authorization Time Period 12 visita approved 01/09/23-02/25/23    Authorization - Number of Visits 12    Progress Note Due on Visit 10    PT Start Time 1049    PT Stop Time 1127    PT Time Calculation (min) 38 min    Activity Tolerance Patient tolerated treatment well    Behavior During Therapy WFL for tasks assessed/performed               Past Medical History:  Diagnosis Date   Adenomatous colon polyp    Arthritis    bilateral knees-needs replacements   Claustrophobia    Colon cancer (HCC) 2015   COVID-19    Dysrhythmia    Eye abnormalities    right eye seen in ED 02/17/2014 wearing eye patch using eye drops   GERD (gastroesophageal reflux disease)    with certain foods   Glaucoma    not on eye drops at this time (10/21/2020)   Hypertension    on meds   Irregular heartbeat    per pt/doesn't know what it does   Osteoarthritis    bilat knees  needs replacement   Osteoporosis    pt denies   Substance abuse (HCC)    stopped over 3 years ago   UC (ulcerative colitis) (HCC)    Vitamin D deficiency    Past Surgical History:  Procedure Laterality Date   APPLICATION OF WOUND VAC  12/16/2022   Procedure: APPLICATION OF WOUND VAC;  Surgeon: Tarry Kos, MD;  Location: [facility];  Service: Orthopedics;;   COLON RESECTION N/A 08/23/2013   Procedure: Laparoscopic total abdominal colectomy and hernia repair;  Surgeon: Romie Levee, MD;  Location: [facility];  Service: General;  Laterality: N/A;   COLON SURGERY  08/2013   COLONOSCOPY  11/02/2020   EUS N/A 07/18/2013   Procedure: UPPER ENDOSCOPIC ULTRASOUND (EUS) RADIAL;  Surgeon: Rachael Fee, MD;  Location: [facility];  Service:  Endoscopy;  Laterality: N/A;   EYE SURGERY     FLEXIBLE SIGMOIDOSCOPY N/A 2020   prep good   HERNIA REPAIR  2005   In [place]/put in a mesh   INCISIONAL HERNIA REPAIR N/A 02/20/2014   Procedure: LAP ASSISTED INCISIONAL HERNIA REPAIR LYSIS OF ADHESIONS;  Surgeon: Romie Levee, MD;  Location: [facility];  Service: General;  Laterality: N/A;  converted to open @ 0935   INCISIONAL HERNIA REPAIR N/A 02/20/2014   Procedure: HERNIA REPAIR INCISIONAL;  Surgeon: Romie Levee, MD;  Location: [facility];  Service: General;  Laterality: N/A;  With MESH   INTRAOCULAR LENS INSERTION Bilateral    6 yrs ago   TOTAL KNEE ARTHROPLASTY Right 11/11/2021   Procedure: RIGHT TOTAL KNEE ARTHROPLASTY;  Surgeon: Tarry Kos, MD;  Location: [facility];  Service: Orthopedics;  Laterality: Right;   TOTAL KNEE ARTHROPLASTY Left 12/16/2022   Procedure: LEFT TOTAL KNEE ARTHROPLASTY;  Surgeon: Tarry Kos, MD;  Location: [facility];  Service: Orthopedics;  Laterality: Left;   VENTRAL HERNIA REPAIR     Patient Active Problem List   Diagnosis Date Noted   Status post total left knee replacement 12/16/2022   Primary osteoarthritis  of left knee 10/26/2022   Schizoaffective disorder, bipolar type (HCC) 09/20/2022   Primary osteoarthritis of right knee 11/11/2021   Status post total right knee replacement 11/11/2021   AKI (acute kidney injury) (HCC) 04/21/2019   Pneumonia due to COVID-19 virus 04/21/2019   Osteoporosis    Ulcerative colitis, with rectal bleeding 08/06/2015   Gastroesophageal reflux disease without esophagitis 08/06/2015   Visual impairment of right eye 06/18/2015   Essential hypertension, benign 06/18/2015   Hernia of abdominal cavity 11/27/2014   Bipolar 1 disorder, mixed, moderate (HCC) 11/27/2014   Cellulitis 08/23/2014   Medically noncompliant 08/23/2014   Left leg cellulitis 08/23/2014   Cellulitis of left leg    Hypokalemia    Diarrhea 05/06/2014   Ulcerative colitis (HCC) 04/17/2014   Verrucous keratosis  04/17/2014   Nonspecific (abnormal) findings on radiological and other examination of gastrointestinal tract 07/18/2013   Acute esophagitis 07/18/2013   Hypovitaminosis D 05/06/2013   Ulcerative colitis, unspecified 04/04/2013   Knee pain, bilateral 03/05/2013    PCP: Hoy Register, MD  REFERRING PROVIDER: Tarry Kos, MD  REFERRING DIAG: [phone] (ICD-10-CM) - Status post total left knee replacement  THERAPY DIAG:  Left knee pain, unspecified chronicity  Stiffness of left knee, not elsewhere classified  Rationale for Evaluation and Treatment: Rehabilitation  ONSET DATE: 12/16/22  SUBJECTIVE:   SUBJECTIVE STATEMENT: Pt presents to PT with no current knee pain. Has continued HEP compliance with no adverse effect.  PERTINENT HISTORY: HTN, GERD, osteoporosis, s/p L TKA, cancer hx   PAIN:  Are you having pain: 4/10  Location/description: mostly soreness in L knee Best-worst over past week: 0-6/10  - aggravating factors: WB (especially in the morning), bending  - Easing factors: rest, ice    PRECAUTIONS: fall risk   WEIGHT BEARING RESTRICTIONS: No  FALLS:  Has patient fallen in last 6 months? 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, is a Secondary school teacher  PLOF: independent with SPC, uses sock aid for lower body, driving  PATIENT GOALS: get better with movement  NEXT MD VISIT: 4 weeks  OBJECTIVE:   DIAGNOSTIC FINDINGS: s/p L TKA 12/16/22  PATIENT SURVEYS:  FOTO 55 current, 62 predicted  COGNITION: Overall cognitive status: Within functional limits for tasks assessed     SENSATION: Light touch intact and symmetrical although pt endorses mild numbness medial calf  EDEMA:  Mild swelling WNL, no eyrthema or drainage noted  today  MUSCLE LENGTH: NT  POSTURE: forward flexed trunk, reduced knee ext BIL in static standing  PALPATION: Mild tenderness about L knee joint as expected, no quad/calf tenderness  LOWER EXTREMITY ROM:     Active  Right eval Left eval Left 01/10/23 Left 01/18/23 Left 01/23/23  Hip flexion       Hip extension       Hip internal rotation       Hip external rotation       Knee extension  A: lacking 5 deg A: -4  A: lacking 4  Knee flexion  A: 103 deg AAROM: 112 A: 110 AA: 115  (Blank rows = not tested) (Key: WFL = within functional limits not formally assessed, * = concordant pain, s = stiffness/stretching sensation, NT = not tested)  Comments:    LOWER EXTREMITY MMT:  MMT Right eval Left eval  Hip flexion    Hip abduction (modified sitting)    Hip internal rotation    Hip external rotation    Knee flexion    Knee extension    Ankle dorsiflexion     (Blank rows = not tested) (Key: WFL = within functional limits not formally assessed, * = concordant pain, s = stiffness/stretching sensation, NT = not tested)  Comments: deferred given proximity to surgery  LOWER EXTREMITY SPECIAL TESTS:  deferred  FUNCTIONAL TESTS:  5xSTS: 13.22sec UE support first two reps, noted L knee pain TUG: 14.83sec without AD    GAIT: Distance walked: within clinic Assistive device utilized: Quad cane small base Level of assistance: Modified independence Comments: partial step through pattern, reduced L knee ROM throughout all phases of gait, fwd flexed posture   TODAY'S TREATMENT:         [facility] Adult PT Treatment:                                                DATE: 01/23/23 Therapeutic Exercise: Bike no resistance for ROM full revolutions x 3 min 6" step ups fwd/lat Lt leading x 20 10" stacked steps knee flexion stretch Lt x 20 Slant board calf stretch x 30" Staning mini squat with UE 2x10 Standing TKE with ball Lt 2x10 Seated LAQ Lt 2.5# 2x10  Seated hamstring curl Lt x 15 blue  band Supine quad set towel under Lt heel x 10 - 5" hold Supine heel slide with strap x 10 - Lt 5" hold SLR Lt with QS 2x10 (slight quad lag with second set) 2.5# Modalities: Cold pack x10 mins anterior knee, pt in sitting (post session)  [facility] Adult PT Treatment:                                                DATE: 01/18/23 Therapeutic Exercise: Bike no resistance for ROM full revolutions 6" step ups fwd/lat Lt leading 2x10 10" stacked steps knee flexion stretch Lt x20 Standing TKE BlueTB Lt 2x10 Seated LAQ with adduction Lt 2# 2x10  SLR Lt with QS 2x10 (slight quad lag with second set) Seated heel slides Lt 2x10 LLLD stretch heel propped 4# above knee x2' Modalities: Cold pack x10 mins anterior knee, pt in sitting (post session)   [facility] Adult PT Treatment:                                                DATE: 01/11/23 Therapeutic Exercise: Bike no resistance for ROM full revolutions 6" step ups fwd/lat Lt leading 2x10 10" stacked steps knee flexion stretch Lt x20 Standing TKE GTB Lt 2x10 Seated LAQ with adduction Lt 2x15  Supine QS towel roll Lt 5" hold 2x10 SLR Lt 2x10 Supine heel slides with strap Lt x10 Modalities: Cold pack x10 mins anterior knee, pt in sitting  (post session)  PATIENT EDUCATION:  Education details: Pt education on PT impairments, prognosis, and POC. Informed consent. Rationale for interventions, safe/appropriate HEP performance Person educated: Patient Education method: Explanation, Demonstration, Tactile cues, Verbal cues, and Handouts  Education comprehension: verbalized understanding, returned demonstration, verbal cues required, tactile cues required, and needs further education    HOME EXERCISE PROGRAM: Access Code: [email or web address] URL: [email or web address] Date: 01/05/2023 Prepared by: Fransisco Hertz  Exercises - Long Sitting Quad Set with Towel Roll Under Heel  - 2-3 x daily - 7 x weekly - 1 sets - 10 reps - Supine Heel Slide  - 2-3 x  daily - 7 x weekly - 1 sets - 10 reps - Seated Long Arc Quad  - 2-3 x daily - 7 x weekly - 1 sets - 10 reps - Sit to Stand with Armchair  - 2-3 x daily - 7 x weekly - 1 sets - 10 reps  ASSESSMENT:  CLINICAL IMPRESSION: Pt was once again able to complete all prescribed exercises with no adverse effect. Therapy focused on improving L knee ROM and quad strength for improving functional mobility. He is progressing very well with therapy, will continue per POC as prescribed.  OBJECTIVE IMPAIRMENTS: Abnormal gait, decreased activity tolerance, decreased balance, decreased endurance, decreased mobility, difficulty walking, decreased ROM, decreased strength, increased edema, improper body mechanics, postural dysfunction, and pain.   ACTIVITY LIMITATIONS: carrying, lifting, bending, standing, squatting, stairs, transfers, and locomotion level  PARTICIPATION LIMITATIONS: meal prep, cleaning, and laundry  PERSONAL FACTORS: Age, Time since onset of injury/illness/exacerbation, and 3+ comorbidities: HTN, osteoporosis, hx colon cancer  are also affecting patient's functional outcome.    GOALS: Goals reviewed with patient? No  SHORT TERM GOALS: Target date: 02/02/2023 Pt will demonstrate appropriate understanding and performance of initially prescribed HEP in order to facilitate improved independence with management of symptoms.  Baseline: HEP provided on eval Goal status: INITIAL   2. Pt will score greater than or equal to 59 on FOTO in order to demonstrate improved perception of function due to symptoms.  Baseline: 55  Goal status: INITIAL   LONG TERM GOALS: Target date: 03/02/2023 Pt will score 62 or greater on FOTO in order to demonstrate improved perception of function due to symptoms.  Baseline: 55 Goal status: INITIAL  2.  Pt will demonstrate at least 0-120 degrees of left knee AROM in order to facilitate improved tolerance to functional movements such as squatting/walking. Baseline: see  ROM chart above Goal status: INITIAL  3. Pt will be able to perform TUG in less than or equal to 11sec sec in order to indicate reduced risk of falling (cutoff score for fall risk 13.5 sec in community dwelling older adults per [facility] et al, 2000)  Baseline: 14sec  Goal status: INITIAL    4.  Pt will be able to perform 5xSTS in less than or equal to 11sec without UE support in order to demonstrate reduced fall risk and improved functional independence (MCID 5xSTS = 2.3 sec). Baseline: 13sec UE support first two reps Goal status: INITIAL   5. Pt will demonstrate symmetrical knee flex/ext MMT in order to facilitate improved functional strength.  Baseline: NT on eval given proximity to surgery  Goal status: INITIAL  6. Pt will report at least 50% decrease in overall pain levels in past week in order to facilitate improved tolerance to basic ADLs/mobility.   Baseline: 0-6/10  Goal status: INITIAL    7. Pt will demonstrate appropriate performance of final prescribed HEP in order to facilitate improved self-management of symptoms post-discharge.   Baseline: initial HEP prescribed  Goal status: INITIAL     PLAN:  PT FREQUENCY: 2x/week  PT DURATION: 8 weeks  PLANNED INTERVENTIONS: Therapeutic exercises, Therapeutic  activity, Neuromuscular re-education, Balance training, Gait training, Patient/Family education, Self Care, Joint mobilization, Stair training, Aquatic Therapy, Dry Needling, Cryotherapy, Moist heat, scar mobilization, Taping, Manual therapy, and Re-evaluation  PLAN FOR NEXT SESSION: Review/update HEP PRN. Work on [employer] exercises as appropriate with emphasis on quad activation and muscular control throughout ROM. Symptom modification strategies as indicated/appropriate.    Eloy End PT 01/23/2023 11:27 AM    Referring diagnosis? G29.528 (ICD-10-CM) - Status post total left knee replacement Treatment diagnosis? (if different than referring diagnosis) Left  knee pain, unspecified chronicity  Stiffness of left knee, not elsewhere classified  Other abnormalities of gait and mobility What was this (referring dx) caused by? [x]  Surgery []  Fall []  Ongoing issue []  Arthritis []  Other: ____________  Laterality: []  Rt [x]  Lt []  Both  Check all possible CPT codes:  *CHOOSE 10 OR LESS*    []  97110 (Therapeutic Exercise)  []  92507 (SLP Treatment)  []  97112 (Neuro Re-ed)   []  92526 (Swallowing Treatment)   []  97116 (Gait Training)   []  [id number] (Cognitive Training, 1st 15 minutes) []  97140 (Manual Therapy)   []  97130 (Cognitive Training, each add'l 15 minutes)  []  41324 (Re-evaluation)                              []  Other, List CPT Code ____________  []  97530 (Therapeutic Activities)     []  97535 (Self Care)   [x]  All codes above (97110 - 97535)  []  97012 (Mechanical Traction)  []  97014 (E-stim Unattended)  []  97032 (E-stim manual)  []  97033 (Ionto)  []  97035 (Ultrasound) [x]  97750 (Physical Performance Training) [x]  [id number] (Aquatic Therapy) []  40102 (Vasopneumatic Device) []  [id number] (Paraffin) []  97034 (Contrast Bath) []  97597 (Wound Care 1st 20 sq cm) []  97598 (Wound Care each add'l 20 sq cm) []  97760 (Orthotic Fabrication, Fitting, Training Initial) []  [id number] (Prosthetic Management and Training Initial) []  [id number] (Orthotic or Prosthetic Training/ Modification Subsequent)

## 2023-01-24 ENCOUNTER — Ambulatory Visit: Payer: Medicare HMO

## 2023-01-24 NOTE — Therapy (Incomplete)
OUTPATIENT PHYSICAL THERAPY TREATMENT NOTE   Patient Name: Robert Rhodes MRN: [id number] DOB:[birth year], 77 y.o., male Today's Date: 01/24/2023  END OF SESSION:      Past Medical History:  Diagnosis Date   Adenomatous colon polyp    Arthritis    bilateral knees-needs replacements   Claustrophobia    Colon cancer (HCC) 2015   COVID-19    Dysrhythmia    Eye abnormalities    right eye seen in ED 02/17/2014 wearing eye patch using eye drops   GERD (gastroesophageal reflux disease)    with certain foods   Glaucoma    not on eye drops at this time (10/21/2020)   Hypertension    on meds   Irregular heartbeat    per pt/doesn't know what it does   Osteoarthritis    bilat knees  needs replacement   Osteoporosis    pt denies   Substance abuse (HCC)    stopped over 3 years ago   UC (ulcerative colitis) (HCC)    Vitamin D deficiency    Past Surgical History:  Procedure Laterality Date   APPLICATION OF WOUND VAC  12/16/2022   Procedure: APPLICATION OF WOUND VAC;  Surgeon: Tarry Kos, MD;  Location: [facility];  Service: Orthopedics;;   COLON RESECTION N/A 08/23/2013   Procedure: Laparoscopic total abdominal colectomy and hernia repair;  Surgeon: Romie Levee, MD;  Location: [facility];  Service: General;  Laterality: N/A;   COLON SURGERY  08/2013   COLONOSCOPY  11/02/2020   EUS N/A 07/18/2013   Procedure: UPPER ENDOSCOPIC ULTRASOUND (EUS) RADIAL;  Surgeon: Rachael Fee, MD;  Location: [facility];  Service: Endoscopy;  Laterality: N/A;   EYE SURGERY     FLEXIBLE SIGMOIDOSCOPY N/A 2020   prep good   HERNIA REPAIR  2005   In [place]/put in a mesh   INCISIONAL HERNIA REPAIR N/A 02/20/2014   Procedure: LAP ASSISTED INCISIONAL HERNIA REPAIR LYSIS OF ADHESIONS;  Surgeon: Romie Levee, MD;  Location: [facility];  Service: General;  Laterality: N/A;  converted to open @ 0935   INCISIONAL HERNIA REPAIR N/A 02/20/2014   Procedure: HERNIA REPAIR INCISIONAL;  Surgeon: Romie Levee, MD;  Location:  [facility];  Service: General;  Laterality: N/A;  With MESH   INTRAOCULAR LENS INSERTION Bilateral    6 yrs ago   TOTAL KNEE ARTHROPLASTY Right 11/11/2021   Procedure: RIGHT TOTAL KNEE ARTHROPLASTY;  Surgeon: Tarry Kos, MD;  Location: [facility];  Service: Orthopedics;  Laterality: Right;   TOTAL KNEE ARTHROPLASTY Left 12/16/2022   Procedure: LEFT TOTAL KNEE ARTHROPLASTY;  Surgeon: Tarry Kos, MD;  Location: [facility];  Service: Orthopedics;  Laterality: Left;   VENTRAL HERNIA REPAIR     Patient Active Problem List   Diagnosis Date Noted   Status post total left knee replacement 12/16/2022   Primary osteoarthritis of left knee 10/26/2022   Schizoaffective disorder, bipolar type (HCC) 09/20/2022   Primary osteoarthritis of right knee 11/11/2021   Status post total right knee replacement 11/11/2021   AKI (acute kidney injury) (HCC) 04/21/2019   Pneumonia due to COVID-19 virus 04/21/2019   Osteoporosis    Ulcerative colitis, with rectal bleeding 08/06/2015   Gastroesophageal reflux disease without esophagitis 08/06/2015   Visual impairment of right eye 06/18/2015   Essential hypertension, benign 06/18/2015   Hernia of abdominal cavity 11/27/2014   Bipolar 1 disorder, mixed, moderate (HCC) 11/27/2014   Cellulitis 08/23/2014   Medically noncompliant 08/23/2014   Left leg cellulitis  08/23/2014   Cellulitis of left leg    Hypokalemia    Diarrhea 05/06/2014   Ulcerative colitis (HCC) 04/17/2014   Verrucous keratosis 04/17/2014   Nonspecific (abnormal) findings on radiological and other examination of gastrointestinal tract 07/18/2013   Acute esophagitis 07/18/2013   Hypovitaminosis D 05/06/2013   Ulcerative colitis, unspecified 04/04/2013   Knee pain, bilateral 03/05/2013    PCP: Hoy Register, MD  REFERRING PROVIDER: Tarry Kos, MD  REFERRING DIAG: [phone] (ICD-10-CM) - Status post total left knee replacement  THERAPY DIAG:  No diagnosis found.  Rationale for Evaluation and  Treatment: Rehabilitation  ONSET DATE: 12/16/22  SUBJECTIVE:   SUBJECTIVE STATEMENT: ***  *** Pt presents to PT with no current knee pain. Has continued HEP compliance with no adverse effect.  PERTINENT HISTORY: HTN, GERD, osteoporosis, s/p L TKA, cancer hx   PAIN:  Are you having pain: 4/10 ***  Location/description: mostly soreness in L knee Best-worst over past week: 0-6/10  - aggravating factors: WB (especially in the morning), bending  - Easing factors: rest, ice    PRECAUTIONS: fall risk   WEIGHT BEARING RESTRICTIONS: No  FALLS:  Has patient fallen in last 6 months? 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, is a Secondary school teacher  PLOF: independent with SPC, uses sock aid for lower body, driving  PATIENT GOALS: get better with movement  NEXT MD VISIT: 4 weeks  OBJECTIVE: (objective measures completed at initial evaluation unless otherwise dated)   DIAGNOSTIC FINDINGS: s/p L TKA 12/16/22  PATIENT SURVEYS:  FOTO 55 current, 62 predicted  COGNITION: Overall cognitive status: Within functional limits for tasks assessed     SENSATION: Light touch intact and symmetrical although pt endorses mild numbness medial calf  EDEMA:  Mild swelling WNL, no eyrthema or drainage noted today  MUSCLE LENGTH: NT  POSTURE: forward flexed trunk, reduced knee ext BIL in static standing  PALPATION: Mild tenderness about L knee joint as expected, no quad/calf tenderness  LOWER EXTREMITY ROM:     Active  Right eval Left eval Left 01/10/23 Left 01/18/23 Left 01/23/23  Hip flexion       Hip extension       Hip internal rotation       Hip external rotation       Knee extension  A: lacking 5 deg A: -4  A: lacking 4  Knee flexion  A: 103  deg AAROM: 112 A: 110 AA: 115  (Blank rows = not tested) (Key: WFL = within functional limits not formally assessed, * = concordant pain, s = stiffness/stretching sensation, NT = not tested)  Comments:    LOWER EXTREMITY MMT:    MMT Right eval Left eval  Hip flexion    Hip abduction (modified sitting)    Hip internal rotation    Hip external rotation    Knee flexion    Knee extension    Ankle dorsiflexion     (Blank rows = not tested) (Key: WFL = within functional limits not formally assessed, * = concordant pain, s = stiffness/stretching sensation, NT = not tested)  Comments: deferred given proximity to surgery  LOWER EXTREMITY SPECIAL TESTS:  deferred  FUNCTIONAL TESTS:  5xSTS: 13.22sec UE support first two reps, noted L knee pain TUG: 14.83sec without  AD    GAIT: Distance walked: within clinic Assistive device utilized: Quad cane small base Level of assistance: Modified independence Comments: partial step through pattern, reduced L knee ROM throughout all phases of gait, fwd flexed posture   TODAY'S TREATMENT:         [facility] Adult PT Treatment:                                                DATE: 01/25/23 Therapeutic Exercise: *** Manual Therapy: *** Neuromuscular re-ed: *** Therapeutic Activity: *** Modalities: *** Self Care: ***    [facility] Treatment:                                                DATE: 01/23/23 Therapeutic Exercise: Bike no resistance for ROM full revolutions x 3 min 6" step ups fwd/lat Lt leading x 20 10" stacked steps knee flexion stretch Lt x 20 Slant board calf stretch x 30" Staning mini squat with UE 2x10 Standing TKE with ball Lt 2x10 Seated LAQ Lt 2.5# 2x10  Seated hamstring curl Lt x 15 blue band Supine quad set towel under Lt heel x 10 - 5" hold Supine heel slide with strap x 10 - Lt 5" hold SLR Lt with QS 2x10 (slight quad lag with second set) 2.5# Modalities: Cold pack x10 mins anterior knee, pt in sitting (post  session)  [facility] Adult PT Treatment:                                                DATE: 01/18/23 Therapeutic Exercise: Bike no resistance for ROM full revolutions 6" step ups fwd/lat Lt leading 2x10 10" stacked steps knee flexion stretch Lt x20 Standing TKE BlueTB Lt 2x10 Seated LAQ with adduction Lt 2# 2x10  SLR Lt with QS 2x10 (slight quad lag with second set) Seated heel slides Lt 2x10 LLLD stretch heel propped 4# above knee x2' Modalities: Cold pack x10 mins anterior knee, pt in sitting (post session)   [facility] Adult PT Treatment:                                                DATE: 01/11/23 Therapeutic Exercise: Bike no resistance for ROM full revolutions 6" step ups fwd/lat Lt leading 2x10 10" stacked steps knee flexion stretch Lt x20 Standing TKE GTB Lt 2x10 Seated LAQ with adduction Lt 2x15  Supine QS towel roll Lt 5" hold 2x10 SLR Lt 2x10 Supine heel slides with strap Lt x10 Modalities: Cold pack x10 mins anterior knee, pt in sitting  (post session)  PATIENT EDUCATION:  Education details: rationale for interventions, HEP  Person educated: Patient Education method: Explanation, Demonstration, Tactile cues, Verbal cues, and Handouts Education comprehension: verbalized understanding, returned demonstration, verbal cues required, tactile cues required, and needs further education    HOME EXERCISE PROGRAM: Access Code: [email or web address] URL: [email or web address] Date: 01/05/2023 Prepared by: Fransisco Hertz  Exercises - Long Sitting Quad Set with Towel  Roll Under Heel  - 2-3 x daily - 7 x weekly - 1 sets - 10 reps - Supine Heel Slide  - 2-3 x daily - 7 x weekly - 1 sets - 10 reps - Seated Long Arc Quad  - 2-3 x daily - 7 x weekly - 1 sets - 10 reps - Sit to Stand with Armchair  - 2-3 x daily - 7 x weekly - 1 sets - 10 reps  ASSESSMENT:  CLINICAL IMPRESSION: ***  *** Pt was once again able to complete all prescribed exercises with no adverse effect. Therapy  focused on improving L knee ROM and quad strength for improving functional mobility. He is progressing very well with therapy, will continue per POC as prescribed.  OBJECTIVE IMPAIRMENTS: Abnormal gait, decreased activity tolerance, decreased balance, decreased endurance, decreased mobility, difficulty walking, decreased ROM, decreased strength, increased edema, improper body mechanics, postural dysfunction, and pain.   ACTIVITY LIMITATIONS: carrying, lifting, bending, standing, squatting, stairs, transfers, and locomotion level  PARTICIPATION LIMITATIONS: meal prep, cleaning, and laundry  PERSONAL FACTORS: Age, Time since onset of injury/illness/exacerbation, and 3+ comorbidities: HTN, osteoporosis, hx colon cancer  are also affecting patient's functional outcome.    GOALS: Goals reviewed with patient? No  SHORT TERM GOALS: Target date: 02/02/2023 Pt will demonstrate appropriate understanding and performance of initially prescribed HEP in order to facilitate improved independence with management of symptoms.  Baseline: HEP provided on eval Goal status: INITIAL   2. Pt will score greater than or equal to 59 on FOTO in order to demonstrate improved perception of function due to symptoms.  Baseline: 55  Goal status: INITIAL   LONG TERM GOALS: Target date: 03/02/2023 Pt will score 62 or greater on FOTO in order to demonstrate improved perception of function due to symptoms.  Baseline: 55 Goal status: INITIAL  2.  Pt will demonstrate at least 0-120 degrees of left knee AROM in order to facilitate improved tolerance to functional movements such as squatting/walking. Baseline: see ROM chart above Goal status: INITIAL  3. Pt will be able to perform TUG in less than or equal to 11sec sec in order to indicate reduced risk of falling (cutoff score for fall risk 13.5 sec in community dwelling older adults per [facility] et al, 2000)  Baseline: 14sec  Goal status: INITIAL    4.  Pt will be  able to perform 5xSTS in less than or equal to 11sec without UE support in order to demonstrate reduced fall risk and improved functional independence (MCID 5xSTS = 2.3 sec). Baseline: 13sec UE support first two reps Goal status: INITIAL   5. Pt will demonstrate symmetrical knee flex/ext MMT in order to facilitate improved functional strength.  Baseline: NT on eval given proximity to surgery  Goal status: INITIAL  6. Pt will report at least 50% decrease in overall pain levels in past week in order to facilitate improved tolerance to basic ADLs/mobility.   Baseline: 0-6/10  Goal status: INITIAL    7. Pt will demonstrate appropriate performance of final prescribed HEP in order to facilitate improved self-management of symptoms post-discharge.   Baseline: initial HEP prescribed  Goal status: INITIAL     PLAN:  PT FREQUENCY: 2x/week  PT DURATION: 8 weeks  PLANNED INTERVENTIONS: Therapeutic exercises, Therapeutic activity, Neuromuscular re-education, Balance training, Gait training, Patient/Family education, Self Care, Joint mobilization, Stair training, Aquatic Therapy, Dry Needling, Cryotherapy, Moist heat, scar mobilization, Taping, Manual therapy, and Re-evaluation  PLAN FOR NEXT SESSION: Review/update HEP PRN. Work on  ROM/strength exercises as appropriate with emphasis on quad activation and muscular control throughout ROM. Symptom modification strategies as indicated/appropriate. ***    Ashley Murrain PT, DPT 01/24/2023 12:56 PM    Referring diagnosis? Z61.096 (ICD-10-CM) - Status post total left knee replacement Treatment diagnosis? (if different than referring diagnosis) Left knee pain, unspecified chronicity  Stiffness of left knee, not elsewhere classified  Other abnormalities of gait and mobility What was this (referring dx) caused by? [x]  Surgery []  Fall []  Ongoing issue []  Arthritis []  Other: ____________  Laterality: []  Rt [x]  Lt []  Both  Check all possible  CPT codes:  *CHOOSE 10 OR LESS*    []  97110 (Therapeutic Exercise)  []  92507 (SLP Treatment)  []  97112 (Neuro Re-ed)   []  92526 (Swallowing Treatment)   []  97116 (Gait Training)   []  [id number] (Cognitive Training, 1st 15 minutes) []  97140 (Manual Therapy)   []  97130 (Cognitive Training, each add'l 15 minutes)  []  97164 (Re-evaluation)                              []  Other, List CPT Code ____________  []  97530 (Therapeutic Activities)     []  04540 (Self Care)   [x]  All codes above (97110 - 97535)  []  97012 (Mechanical Traction)  []  97014 (E-stim Unattended)  []  97032 (E-stim manual)  []  97033 (Ionto)  []  97035 (Ultrasound) [x]  97750 (Physical Performance Training) [x]  [id number] (Aquatic Therapy) []  97016 (Vasopneumatic Device) []  [id number] (Paraffin) []  97034 (Contrast Bath) []  97597 (Wound Care 1st 20 sq cm) []  97598 (Wound Care each add'l 20 sq cm) []  97760 (Orthotic Fabrication, Fitting, Training Initial) []  [id number] (Prosthetic Management and Training Initial) []  [id number] (Orthotic or Prosthetic Training/ Modification Subsequent)

## 2023-01-25 ENCOUNTER — Telehealth: Payer: Self-pay | Admitting: Physical Therapy

## 2023-01-25 ENCOUNTER — Ambulatory Visit: Payer: Medicare HMO

## 2023-01-25 DIAGNOSIS — M25562 Pain in left knee: Secondary | ICD-10-CM

## 2023-01-25 DIAGNOSIS — R2689 Other abnormalities of gait and mobility: Secondary | ICD-10-CM

## 2023-01-25 DIAGNOSIS — M25662 Stiffness of left knee, not elsewhere classified: Secondary | ICD-10-CM | POA: Diagnosis not present

## 2023-01-25 NOTE — Therapy (Signed)
OUTPATIENT PHYSICAL THERAPY TREATMENT NOTE   Patient Name: Robert Rhodes MRN: [id number] DOB:1945/08/07, 77 y.o., male Today's Date: 01/25/2023  END OF SESSION:  PT End of Session - 01/25/23 1527     Visit Number 6    Date for PT Re-Evaluation 03/02/23    Authorization Type [employer]    Authorization Time Period 12 visita approved 01/09/23-02/25/23    Authorization - Visit Number 4    Authorization - Number of Visits 12    Progress Note Due on Visit 10    PT Start Time 1530    PT Stop Time 1608    PT Time Calculation (min) 38 min    Activity Tolerance Patient tolerated treatment well    Behavior During Therapy WFL for tasks assessed/performed             Past Medical History:  Diagnosis Date   Adenomatous colon polyp    Arthritis    bilateral knees-needs replacements   Claustrophobia    Colon cancer (HCC) 2015   COVID-19    Dysrhythmia    Eye abnormalities    right eye seen in ED 02/17/2014 wearing eye patch using eye drops   GERD (gastroesophageal reflux disease)    with certain foods   Glaucoma    not on eye drops at this time (10/21/2020)   Hypertension    on meds   Irregular heartbeat    per pt/doesn't know what it does   Osteoarthritis    bilat knees  needs replacement   Osteoporosis    pt denies   Substance abuse (HCC)    stopped over 3 years ago   UC (ulcerative colitis) (HCC)    Vitamin D deficiency    Past Surgical History:  Procedure Laterality Date   APPLICATION OF WOUND VAC  12/16/2022   Procedure: APPLICATION OF WOUND VAC;  Surgeon: Tarry Kos, MD;  Location: [facility];  Service: Orthopedics;;   COLON RESECTION N/A 08/23/2013   Procedure: Laparoscopic total abdominal colectomy and hernia repair;  Surgeon: Romie Levee, MD;  Location: [facility];  Service: General;  Laterality: N/A;   COLON SURGERY  08/2013   COLONOSCOPY  11/02/2020   EUS N/A 07/18/2013   Procedure: UPPER ENDOSCOPIC ULTRASOUND (EUS) RADIAL;  Surgeon: Rachael Fee, MD;   Location: [facility];  Service: Endoscopy;  Laterality: N/A;   EYE SURGERY     FLEXIBLE SIGMOIDOSCOPY N/A 2020   prep good   HERNIA REPAIR  2005   In [place]/put in a mesh   INCISIONAL HERNIA REPAIR N/A 02/20/2014   Procedure: LAP ASSISTED INCISIONAL HERNIA REPAIR LYSIS OF ADHESIONS;  Surgeon: Romie Levee, MD;  Location: [facility];  Service: General;  Laterality: N/A;  converted to open @ 0935   INCISIONAL HERNIA REPAIR N/A 02/20/2014   Procedure: HERNIA REPAIR INCISIONAL;  Surgeon: Romie Levee, MD;  Location: [facility];  Service: General;  Laterality: N/A;  With MESH   INTRAOCULAR LENS INSERTION Bilateral    6 yrs ago   TOTAL KNEE ARTHROPLASTY Right 11/11/2021   Procedure: RIGHT TOTAL KNEE ARTHROPLASTY;  Surgeon: Tarry Kos, MD;  Location: [facility];  Service: Orthopedics;  Laterality: Right;   TOTAL KNEE ARTHROPLASTY Left 12/16/2022   Procedure: LEFT TOTAL KNEE ARTHROPLASTY;  Surgeon: Tarry Kos, MD;  Location: [facility];  Service: Orthopedics;  Laterality: Left;   VENTRAL HERNIA REPAIR     Patient Active Problem List   Diagnosis Date Noted   Status post total left knee  replacement 12/16/2022   Primary osteoarthritis of left knee 10/26/2022   Schizoaffective disorder, bipolar type (HCC) 09/20/2022   Primary osteoarthritis of right knee 11/11/2021   Status post total right knee replacement 11/11/2021   AKI (acute kidney injury) (HCC) 04/21/2019   Pneumonia due to COVID-19 virus 04/21/2019   Osteoporosis    Ulcerative colitis, with rectal bleeding 08/06/2015   Gastroesophageal reflux disease without esophagitis 08/06/2015   Visual impairment of right eye 06/18/2015   Essential hypertension, benign 06/18/2015   Hernia of abdominal cavity 11/27/2014   Bipolar 1 disorder, mixed, moderate (HCC) 11/27/2014   Cellulitis 08/23/2014   Medically noncompliant 08/23/2014   Left leg cellulitis 08/23/2014   Cellulitis of left leg    Hypokalemia    Diarrhea 05/06/2014   Ulcerative colitis (HCC)  04/17/2014   Verrucous keratosis 04/17/2014   Nonspecific (abnormal) findings on radiological and other examination of gastrointestinal tract 07/18/2013   Acute esophagitis 07/18/2013   Hypovitaminosis D 05/06/2013   Ulcerative colitis, unspecified 04/04/2013   Knee pain, bilateral 03/05/2013    PCP: Hoy Register, MD  REFERRING PROVIDER: Tarry Kos, MD  REFERRING DIAG: [phone] (ICD-10-CM) - Status post total left knee replacement  THERAPY DIAG:  Left knee pain, unspecified chronicity  Stiffness of left knee, not elsewhere classified  Other abnormalities of gait and mobility  Rationale for Evaluation and Treatment: Rehabilitation  ONSET DATE: 12/16/22  SUBJECTIVE:   SUBJECTIVE STATEMENT: Patient reports no pain today, only occasional soreness, and HEP compliance.   PERTINENT HISTORY: HTN, GERD, osteoporosis, s/p L TKA, cancer hx   PAIN:  Are you having pain: 0/10  Location/description: mostly soreness in L knee Best-worst over past week: 0-6/10  - aggravating factors: WB (especially in the morning), bending  - Easing factors: rest, ice    PRECAUTIONS: fall risk   WEIGHT BEARING RESTRICTIONS: No  FALLS:  Has patient fallen in last 6 months? 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, is a Secondary school teacher  PLOF: independent with SPC, uses sock aid for lower body, driving  PATIENT GOALS: get better with movement  NEXT MD VISIT: 4 weeks  OBJECTIVE:   DIAGNOSTIC FINDINGS: s/p L TKA 12/16/22  PATIENT SURVEYS:  FOTO 55 current, 62 predicted  COGNITION: Overall cognitive status: Within functional limits for tasks assessed     SENSATION: Light touch intact and symmetrical although pt endorses mild numbness medial  calf  EDEMA:  Mild swelling WNL, no eyrthema or drainage noted today  MUSCLE LENGTH: NT  POSTURE: forward flexed trunk, reduced knee ext BIL in static standing  PALPATION: Mild tenderness about L knee joint as expected, no quad/calf tenderness  LOWER EXTREMITY ROM:     Active  Right eval Left eval Left 01/10/23 Left 01/18/23 Left 01/23/23 Left  01/25/23  Hip flexion        Hip extension        Hip internal rotation        Hip external rotation        Knee extension  A: lacking 5 deg A: -4  A: lacking 4   Knee flexion  A: 103 deg AAROM: 112 A: 110 AA: 115 AA: 115  (Blank rows = not tested) (Key: WFL = within functional limits not formally assessed, * = concordant  pain, s = stiffness/stretching sensation, NT = not tested)  Comments:    LOWER EXTREMITY MMT:    MMT Right eval Left eval  Hip flexion    Hip abduction (modified sitting)    Hip internal rotation    Hip external rotation    Knee flexion    Knee extension    Ankle dorsiflexion     (Blank rows = not tested) (Key: WFL = within functional limits not formally assessed, * = concordant pain, s = stiffness/stretching sensation, NT = not tested)  Comments: deferred given proximity to surgery  LOWER EXTREMITY SPECIAL TESTS:  deferred  FUNCTIONAL TESTS:  5xSTS: 13.22sec UE support first two reps, noted L knee pain TUG: 14.83sec without AD    GAIT: Distance walked: within clinic Assistive device utilized: Quad cane small base Level of assistance: Modified independence Comments: partial step through pattern, reduced L knee ROM throughout all phases of gait, fwd flexed posture   TODAY'S TREATMENT:        [facility] Adult PT Treatment:                                                DATE: 01/25/23 Therapeutic Exercise: Bike no resistance for ROM full revolutions x 5 min 6" step ups fwd/lat Lt leading x 20 10" stacked steps knee flexion stretch Lt x 20 Slant board calf stretch 2 x 30" Standing mini squat with UE  2x10 Standing TKE with ball Lt 2x10 Omega knee extension 5# 2x10 Omega knee flexion 20# 2x10 Supine heel slide with strap x 10 - Lt 5" hold SLR Lt with QS 2x10 (slight quad lag with second set) Modalities: Cold pack x10 mins anterior knee, pt in sitting (post session)   [facility] Adult PT Treatment:                                                DATE: 01/23/23 Therapeutic Exercise: Bike no resistance for ROM full revolutions x 3 min 6" step ups fwd/lat Lt leading x 20 10" stacked steps knee flexion stretch Lt x 20 Slant board calf stretch x 30" Staning mini squat with UE 2x10 Standing TKE with ball Lt 2x10 Seated LAQ Lt 2.5# 2x10  Seated hamstring curl Lt x 15 blue band Supine quad set towel under Lt heel x 10 - 5" hold Supine heel slide with strap x 10 - Lt 5" hold SLR Lt with QS 2x10 (slight quad lag with second set) 2.5# Modalities: Cold pack x10 mins anterior knee, pt in sitting (post session)  [facility] Adult PT Treatment:                                                DATE: 01/18/23 Therapeutic Exercise: Bike no resistance for ROM full revolutions 6" step ups fwd/lat Lt leading 2x10 10" stacked steps knee flexion stretch Lt x20 Standing TKE BlueTB Lt 2x10 Seated LAQ with adduction Lt 2# 2x10  SLR Lt with QS 2x10 (slight quad lag with second set) Seated heel slides Lt 2x10 LLLD stretch heel propped 4# above knee x2' Modalities: Cold pack  x10 mins anterior knee, pt in sitting (post session)    PATIENT EDUCATION:  Education details: Pt education on PT impairments, prognosis, and POC. Informed consent. Rationale for interventions, safe/appropriate HEP performance Person educated: Patient Education method: Explanation, Demonstration, Tactile cues, Verbal cues, and Handouts Education comprehension: verbalized understanding, returned demonstration, verbal cues required, tactile cues required, and needs further education    HOME EXERCISE PROGRAM: Access Code: [email or web address] URL:  [email or web address] Date: 01/05/2023 Prepared by: Fransisco Hertz  Exercises - Long Sitting Quad Set with Towel Roll Under Heel  - 2-3 x daily - 7 x weekly - 1 sets - 10 reps - Supine Heel Slide  - 2-3 x daily - 7 x weekly - 1 sets - 10 reps - Seated Long Arc Quad  - 2-3 x daily - 7 x weekly - 1 sets - 10 reps - Sit to Stand with Armchair  - 2-3 x daily - 7 x weekly - 1 sets - 10 reps  ASSESSMENT:  CLINICAL IMPRESSION: Patient presents to PT reporting no current pain and only occasional tightness. Session today continued to focus on LE strengthening and ROM, he was able to tolerate resisted knee flexion/extension with no increase in pain. Patient was able to tolerate all prescribed exercises with no adverse effects. Patient continues to benefit from skilled PT services and should be progressed as able to improve functional independence.    OBJECTIVE IMPAIRMENTS: Abnormal gait, decreased activity tolerance, decreased balance, decreased endurance, decreased mobility, difficulty walking, decreased ROM, decreased strength, increased edema, improper body mechanics, postural dysfunction, and pain.   ACTIVITY LIMITATIONS: carrying, lifting, bending, standing, squatting, stairs, transfers, and locomotion level  PARTICIPATION LIMITATIONS: meal prep, cleaning, and laundry  PERSONAL FACTORS: Age, Time since onset of injury/illness/exacerbation, and 3+ comorbidities: HTN, osteoporosis, hx colon cancer  are also affecting patient's functional outcome.    GOALS: Goals reviewed with patient? No  SHORT TERM GOALS: Target date: 02/02/2023 Pt will demonstrate appropriate understanding and performance of initially prescribed HEP in order to facilitate improved independence with management of symptoms.  Baseline: HEP provided on eval Goal status: INITIAL   2. Pt will score greater than or equal to 59 on FOTO in order to demonstrate improved perception of function due to  symptoms.  Baseline: 55  Goal status: INITIAL   LONG TERM GOALS: Target date: 03/02/2023 Pt will score 62 or greater on FOTO in order to demonstrate improved perception of function due to symptoms.  Baseline: 55 Goal status: INITIAL  2.  Pt will demonstrate at least 0-120 degrees of left knee AROM in order to facilitate improved tolerance to functional movements such as squatting/walking. Baseline: see ROM chart above Goal status: INITIAL  3. Pt will be able to perform TUG in less than or equal to 11sec sec in order to indicate reduced risk of falling (cutoff score for fall risk 13.5 sec in community dwelling older adults per [facility] et al, 2000)  Baseline: 14sec  Goal status: INITIAL    4.  Pt will be able to perform 5xSTS in less than or equal to 11sec without UE support in order to demonstrate reduced fall risk and improved functional independence (MCID 5xSTS = 2.3 sec). Baseline: 13sec UE support first two reps Goal status: INITIAL   5. Pt will demonstrate symmetrical knee flex/ext MMT in order to facilitate improved functional strength.  Baseline: NT on eval given proximity to surgery  Goal status: INITIAL  6. Pt will report at least 50% decrease in overall pain levels  in past week in order to facilitate improved tolerance to basic ADLs/mobility.   Baseline: 0-6/10  Goal status: INITIAL    7. Pt will demonstrate appropriate performance of final prescribed HEP in order to facilitate improved self-management of symptoms post-discharge.   Baseline: initial HEP prescribed  Goal status: INITIAL     PLAN:  PT FREQUENCY: 2x/week  PT DURATION: 8 weeks  PLANNED INTERVENTIONS: Therapeutic exercises, Therapeutic activity, Neuromuscular re-education, Balance training, Gait training, Patient/Family education, Self Care, Joint mobilization, Stair training, Aquatic Therapy, Dry Needling, Cryotherapy, Moist heat, scar mobilization, Taping, Manual therapy, and Re-evaluation  PLAN  FOR NEXT SESSION: Review/update HEP PRN. Work on [employer] exercises as appropriate with emphasis on quad activation and muscular control throughout ROM. Symptom modification strategies as indicated/appropriate.    Berta Minor PTA 01/25/2023 4:14 PM    Referring diagnosis? U44.034 (ICD-10-CM) - Status post total left knee replacement Treatment diagnosis? (if different than referring diagnosis) Left knee pain, unspecified chronicity  Stiffness of left knee, not elsewhere classified  Other abnormalities of gait and mobility What was this (referring dx) caused by? [x]  Surgery []  Fall []  Ongoing issue []  Arthritis []  Other: ____________  Laterality: []  Rt [x]  Lt []  Both  Check all possible CPT codes:  *CHOOSE 10 OR LESS*    []  97110 (Therapeutic Exercise)  []  92507 (SLP Treatment)  []  97112 (Neuro Re-ed)   []  92526 (Swallowing Treatment)   []  97116 (Gait Training)   []  [id number] (Cognitive Training, 1st 15 minutes) []  97140 (Manual Therapy)   []  97130 (Cognitive Training, each add'l 15 minutes)  []  97164 (Re-evaluation)                              []  Other, List CPT Code ____________  []  97530 (Therapeutic Activities)     []  97535 (Self Care)   [x]  All codes above (97110 - 97535)  []  97012 (Mechanical Traction)  []  97014 (E-stim Unattended)  []  97032 (E-stim manual)  []  97033 (Ionto)  []  97035 (Ultrasound) [x]  97750 (Physical Performance Training) [x]  [id number] (Aquatic Therapy) []  97016 (Vasopneumatic Device) []  [id number] (Paraffin) []  97034 (Contrast Bath) []  74259 (Wound Care 1st 20 sq cm) []  97598 (Wound Care each add'l 20 sq cm) []  97760 (Orthotic Fabrication, Fitting, Training Initial) []  [id number] (Prosthetic Management and Training Initial) []  [id number] (Orthotic or Prosthetic Training/ Modification Subsequent)

## 2023-01-25 NOTE — Telephone Encounter (Signed)
Called pt re: today's missed appt - does not answer, voice mail box full

## 2023-01-26 ENCOUNTER — Ambulatory Visit: Payer: Medicare HMO

## 2023-01-27 NOTE — Therapy (Signed)
OUTPATIENT PHYSICAL THERAPY TREATMENT NOTE   Patient Name: Robert Rhodes MRN: [id number] DOB:1945-09-24, 77 y.o., male Today's Date: 01/30/2023  END OF SESSION:  PT End of Session - 01/30/23 1017     Visit Number 7    Date for PT Re-Evaluation 03/02/23    Authorization Type [employer]    Authorization Time Period 12 visita approved 01/09/23-02/25/23    Authorization - Visit Number 6    Authorization - Number of Visits 12    Progress Note Due on Visit 10    PT Start Time 1018    PT Stop Time 1100    PT Time Calculation (min) 42 min    Activity Tolerance Patient tolerated treatment well;No increased pain    Behavior During Therapy WFL for tasks assessed/performed              Past Medical History:  Diagnosis Date   Adenomatous colon polyp    Arthritis    bilateral knees-needs replacements   Claustrophobia    Colon cancer (HCC) 2015   COVID-19    Dysrhythmia    Eye abnormalities    right eye seen in ED 02/17/2014 wearing eye patch using eye drops   GERD (gastroesophageal reflux disease)    with certain foods   Glaucoma    not on eye drops at this time (10/21/2020)   Hypertension    on meds   Irregular heartbeat    per pt/doesn't know what it does   Osteoarthritis    bilat knees  needs replacement   Osteoporosis    pt denies   Substance abuse (HCC)    stopped over 3 years ago   UC (ulcerative colitis) (HCC)    Vitamin D deficiency    Past Surgical History:  Procedure Laterality Date   APPLICATION OF WOUND VAC  12/16/2022   Procedure: APPLICATION OF WOUND VAC;  Surgeon: Tarry Kos, MD;  Location: [facility];  Service: Orthopedics;;   COLON RESECTION N/A 08/23/2013   Procedure: Laparoscopic total abdominal colectomy and hernia repair;  Surgeon: Romie Levee, MD;  Location: [facility];  Service: General;  Laterality: N/A;   COLON SURGERY  08/2013   COLONOSCOPY  11/02/2020   EUS N/A 07/18/2013   Procedure: UPPER ENDOSCOPIC ULTRASOUND (EUS) RADIAL;  Surgeon: Rachael Fee, MD;  Location: [facility];  Service: Endoscopy;  Laterality: N/A;   EYE SURGERY     FLEXIBLE SIGMOIDOSCOPY N/A 2020   prep good   HERNIA REPAIR  2005   In [place]/put in a mesh   INCISIONAL HERNIA REPAIR N/A 02/20/2014   Procedure: LAP ASSISTED INCISIONAL HERNIA REPAIR LYSIS OF ADHESIONS;  Surgeon: Romie Levee, MD;  Location: [facility];  Service: General;  Laterality: N/A;  converted to open @ 0935   INCISIONAL HERNIA REPAIR N/A 02/20/2014   Procedure: HERNIA REPAIR INCISIONAL;  Surgeon: Romie Levee, MD;  Location: [facility];  Service: General;  Laterality: N/A;  With MESH   INTRAOCULAR LENS INSERTION Bilateral    6 yrs ago   TOTAL KNEE ARTHROPLASTY Right 11/11/2021   Procedure: RIGHT TOTAL KNEE ARTHROPLASTY;  Surgeon: Tarry Kos, MD;  Location: [facility];  Service: Orthopedics;  Laterality: Right;   TOTAL KNEE ARTHROPLASTY Left 12/16/2022   Procedure: LEFT TOTAL KNEE ARTHROPLASTY;  Surgeon: Tarry Kos, MD;  Location: [facility];  Service: Orthopedics;  Laterality: Left;   VENTRAL HERNIA REPAIR     Patient Active Problem List   Diagnosis Date Noted   Status post  total left knee replacement 12/16/2022   Primary osteoarthritis of left knee 10/26/2022   Schizoaffective disorder, bipolar type (HCC) 09/20/2022   Primary osteoarthritis of right knee 11/11/2021   Status post total right knee replacement 11/11/2021   AKI (acute kidney injury) (HCC) 04/21/2019   Pneumonia due to COVID-19 virus 04/21/2019   Osteoporosis    Ulcerative colitis, with rectal bleeding 08/06/2015   Gastroesophageal reflux disease without esophagitis 08/06/2015   Visual impairment of right eye 06/18/2015   Essential hypertension, benign 06/18/2015   Hernia of abdominal cavity 11/27/2014   Bipolar 1 disorder, mixed, moderate (HCC) 11/27/2014   Cellulitis 08/23/2014   Medically noncompliant 08/23/2014   Left leg cellulitis 08/23/2014   Cellulitis of left leg    Hypokalemia    Diarrhea 05/06/2014   Ulcerative colitis  (HCC) 04/17/2014   Verrucous keratosis 04/17/2014   Nonspecific (abnormal) findings on radiological and other examination of gastrointestinal tract 07/18/2013   Acute esophagitis 07/18/2013   Hypovitaminosis D 05/06/2013   Ulcerative colitis, unspecified 04/04/2013   Knee pain, bilateral 03/05/2013    PCP: Hoy Register, MD  REFERRING PROVIDER: Tarry Kos, MD  REFERRING DIAG: [phone] (ICD-10-CM) - Status post total left knee replacement  THERAPY DIAG:  Left knee pain, unspecified chronicity  Stiffness of left knee, not elsewhere classified  Other abnormalities of gait and mobility  Rationale for Evaluation and Treatment: Rehabilitation  ONSET DATE: 12/16/22  SUBJECTIVE:   SUBJECTIVE STATEMENT: No pain, continues to report doing quite well overall. No issues after last session, did the bike at the gym over the weekend.   PERTINENT HISTORY: HTN, GERD, osteoporosis, s/p L TKA, cancer hx   PAIN:  Are you having pain: 0/10  Location/description: mostly soreness in L knee Best-worst over past week: 0-6/10  - aggravating factors: WB (especially in the morning), bending  - Easing factors: rest, ice    PRECAUTIONS: fall risk   WEIGHT BEARING RESTRICTIONS: No  FALLS:  Has patient fallen in last 6 months? 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, is a Secondary school teacher  PLOF: independent with SPC, uses sock aid for lower body, driving  PATIENT GOALS: get better with movement  NEXT MD VISIT: 4 weeks  OBJECTIVE: (objective measures completed at initial evaluation unless otherwise dated)   DIAGNOSTIC FINDINGS: s/p L TKA 12/16/22  PATIENT SURVEYS:  FOTO 55 current, 62 predicted 01/30/23 FOTO: 60    COGNITION: Overall cognitive  status: Within functional limits for tasks assessed     SENSATION: Light touch intact and symmetrical although pt endorses mild numbness medial calf  EDEMA:  Mild swelling WNL, no eyrthema or drainage noted today  MUSCLE LENGTH: NT  POSTURE: forward flexed trunk, reduced knee ext BIL in static standing  PALPATION: Mild tenderness about L knee joint as expected, no quad/calf tenderness  LOWER EXTREMITY ROM:     Active  Right eval Left eval Left 01/10/23 Left 01/18/23 Left 01/23/23 Left  01/25/23  Hip flexion        Hip extension        Hip internal rotation        Hip external rotation        Knee extension  A: lacking 5 deg A: -4  A: lacking 4   Knee flexion  A: 103 deg AAROM: 112 A: 110 AA: 115 AA: 115  (Blank rows = not tested) (Key: WFL = within functional limits not formally assessed, * = concordant pain, s = stiffness/stretching sensation, NT = not tested)  Comments:    LOWER EXTREMITY MMT:    MMT Right eval Left eval  Hip flexion    Hip abduction (modified sitting)    Hip internal rotation    Hip external rotation    Knee flexion    Knee extension    Ankle dorsiflexion     (Blank rows = not tested) (Key: WFL = within functional limits not formally assessed, * = concordant pain, s = stiffness/stretching sensation, NT = not tested)  Comments: deferred given proximity to surgery  LOWER EXTREMITY SPECIAL TESTS:  deferred  FUNCTIONAL TESTS:  5xSTS: 13.22sec UE support first two reps, noted L knee pain TUG: 14.83sec without AD    GAIT: Distance walked: within clinic Assistive device utilized: Quad cane small base Level of assistance: Modified independence Comments: partial step through pattern, reduced L knee ROM throughout all phases of gait, fwd flexed posture   TODAY'S TREATMENT:        [facility] Adult PT Treatment:                                                DATE: 01/30/23 Therapeutic Exercise: Bike full revolutions 5 min for ROM during  subjective STS 10# x10  Green band TKE at counter 2x10  2x10 SLR cues for quad contraction and pacing   Therapeutic Activity: FOTO + education STS from lowest mat x10, x20 cues for symmetry of WB and trunk mechanics    [facility] Adult PT Treatment:                                                DATE: 01/25/23 Therapeutic Exercise: Bike no resistance for ROM full revolutions x 5 min 6" step ups fwd/lat Lt leading x 20 10" stacked steps knee flexion stretch Lt x 20 Slant board calf stretch 2 x 30" Standing mini squat with UE 2x10 Standing TKE with ball Lt 2x10 Omega knee extension 5# 2x10 Omega knee flexion 20# 2x10 Supine heel slide with strap x 10 - Lt 5" hold SLR Lt with QS 2x10 (slight quad lag with second set) Modalities: Cold pack x10 mins anterior knee, pt in sitting (post session)   [facility] Adult PT Treatment:                                                DATE: 01/23/23 Therapeutic Exercise: Bike no resistance for ROM full revolutions x 3 min 6" step ups fwd/lat Lt leading x 20 10" stacked steps knee flexion stretch Lt x 20 Slant board calf stretch x 30" Staning mini squat with UE 2x10 Standing TKE with ball Lt 2x10 Seated LAQ Lt 2.5# 2x10  Seated hamstring curl Lt x 15 blue band Supine quad set towel under Lt heel x 10 - 5" hold Supine heel slide with strap x 10 - Lt 5" hold SLR Lt with QS 2x10 (slight  quad lag with second set) 2.5# Modalities: Cold pack x10 mins anterior knee, pt in sitting (post session)  [facility] Adult PT Treatment:                                                DATE: 01/18/23 Therapeutic Exercise: Bike no resistance for ROM full revolutions 6" step ups fwd/lat Lt leading 2x10 10" stacked steps knee flexion stretch Lt x20 Standing TKE BlueTB Lt 2x10 Seated LAQ with adduction Lt 2# 2x10  SLR Lt with QS 2x10 (slight quad lag with second set) Seated heel slides Lt 2x10 LLLD stretch heel propped 4# above knee x2' Modalities: Cold pack x10 mins anterior  knee, pt in sitting (post session)    PATIENT EDUCATION:  Education details: rationale for interventions, HEP  Person educated: Patient Education method: Explanation, Demonstration, Tactile cues, Verbal cues, and Handouts Education comprehension: verbalized understanding, returned demonstration, verbal cues required, tactile cues required, and needs further education    HOME EXERCISE PROGRAM: Access Code: [email or web address] URL: [email or web address] Date: 01/30/2023 Prepared by: Fransisco Hertz  Exercises - Long Sitting Quad Set with Towel Roll Under Heel  - 2-3 x daily - 7 x weekly - 1 sets - 10 reps - Supine Heel Slide  - 2-3 x daily - 7 x weekly - 1 sets - 10 reps - Seated Long Arc Quad  - 2-3 x daily - 7 x weekly - 1 sets - 10 reps - Sit to Stand with Armchair  - 2-3 x daily - 7 x weekly - 1 sets - 10 reps - Small Range Straight Leg Raise  - 2-3 x daily - 7 x weekly - 1 sets - 5 reps  ASSESSMENT:  CLINICAL IMPRESSION: Pt arrives w/o pain, continues to endorse steady progress. Continuing to do well with progression for increased LE mobility and strength, cues as above. Noted improvement in SLR performance compared to last session, no lag today despite reported fatigue, added to HEP. No adverse events, denies any pain on departure. Recommend continuing along current POC in order to address relevant deficits and improve functional tolerance. Pt departs today's session in no acute distress, all voiced questions/concerns addressed appropriately from PT perspective.      OBJECTIVE IMPAIRMENTS: Abnormal gait, decreased activity tolerance, decreased balance, decreased endurance, decreased mobility, difficulty walking, decreased ROM, decreased strength, increased edema, improper body mechanics, postural dysfunction, and pain.   ACTIVITY LIMITATIONS: carrying, lifting, bending, standing, squatting, stairs, transfers, and locomotion level  PARTICIPATION LIMITATIONS: meal prep, cleaning,  and laundry  PERSONAL FACTORS: Age, Time since onset of injury/illness/exacerbation, and 3+ comorbidities: HTN, osteoporosis, hx colon cancer  are also affecting patient's functional outcome.    GOALS: Goals reviewed with patient? No  SHORT TERM GOALS: Target date: 02/02/2023 Pt will demonstrate appropriate understanding and performance of initially prescribed HEP in order to facilitate improved independence with management of symptoms.  Baseline: HEP provided on eval Goal status: INITIAL   2. Pt will score greater than or equal to 59 on FOTO in order to demonstrate improved perception of function due to symptoms.  Baseline: 55  Goal status: INITIAL   LONG TERM GOALS: Target date: 03/02/2023 Pt will score 62 or greater on FOTO in order to demonstrate improved perception of function due to symptoms.  Baseline: 55 Goal status: INITIAL  2.  Pt will demonstrate at least 0-120 degrees  of left knee AROM in order to facilitate improved tolerance to functional movements such as squatting/walking. Baseline: see ROM chart above Goal status: INITIAL  3. Pt will be able to perform TUG in less than or equal to 11sec sec in order to indicate reduced risk of falling (cutoff score for fall risk 13.5 sec in community dwelling older adults per [facility] et al, 2000)  Baseline: 14sec  Goal status: INITIAL    4.  Pt will be able to perform 5xSTS in less than or equal to 11sec without UE support in order to demonstrate reduced fall risk and improved functional independence (MCID 5xSTS = 2.3 sec). Baseline: 13sec UE support first two reps Goal status: INITIAL   5. Pt will demonstrate symmetrical knee flex/ext MMT in order to facilitate improved functional strength.  Baseline: NT on eval given proximity to surgery  Goal status: INITIAL  6. Pt will report at least 50% decrease in overall pain levels in past week in order to facilitate improved tolerance to basic ADLs/mobility.   Baseline:  0-6/10  Goal status: INITIAL    7. Pt will demonstrate appropriate performance of final prescribed HEP in order to facilitate improved self-management of symptoms post-discharge.   Baseline: initial HEP prescribed  Goal status: INITIAL     PLAN:  PT FREQUENCY: 2x/week  PT DURATION: 8 weeks  PLANNED INTERVENTIONS: Therapeutic exercises, Therapeutic activity, Neuromuscular re-education, Balance training, Gait training, Patient/Family education, Self Care, Joint mobilization, Stair training, Aquatic Therapy, Dry Needling, Cryotherapy, Moist heat, scar mobilization, Taping, Manual therapy, and Re-evaluation  PLAN FOR NEXT SESSION: Review/update HEP PRN. Work on [employer] exercises as appropriate with emphasis on quad activation and muscular control throughout ROM. Symptom modification strategies as indicated/appropriate.     Ashley Murrain PT, DPT 01/30/2023 11:01 AM    Referring diagnosis? G40.102 (ICD-10-CM) - Status post total left knee replacement Treatment diagnosis? (if different than referring diagnosis) Left knee pain, unspecified chronicity  Stiffness of left knee, not elsewhere classified  Other abnormalities of gait and mobility What was this (referring dx) caused by? [x]  Surgery []  Fall []  Ongoing issue []  Arthritis []  Other: ____________  Laterality: []  Rt [x]  Lt []  Both  Check all possible CPT codes:  *CHOOSE 10 OR LESS*    []  97110 (Therapeutic Exercise)  []  92507 (SLP Treatment)  []  97112 (Neuro Re-ed)   []  92526 (Swallowing Treatment)   []  97116 (Gait Training)   []  [id number] (Cognitive Training, 1st 15 minutes) []  97140 (Manual Therapy)   []  97130 (Cognitive Training, each add'l 15 minutes)  []  97164 (Re-evaluation)                              []  Other, List CPT Code ____________  []  97530 (Therapeutic Activities)     []  97535 (Self Care)   [x]  All codes above (97110 - 97535)  []  97012 (Mechanical Traction)  []  72536 (E-stim Unattended)  []  97032  (E-stim manual)  []  97033 (Ionto)  []  97035 (Ultrasound) [x]  97750 (Physical Performance Training) [x]  [id number] (Aquatic Therapy) []  97016 (Vasopneumatic Device) []  [id number] (Paraffin) []  97034 (Contrast Bath) []  97597 (Wound Care 1st 20 sq cm) []  97598 (Wound Care each add'l 20 sq cm) []  97760 (Orthotic Fabrication, Fitting, Training Initial) []  [id number] (Prosthetic Management and Training Initial) []  [id number] (Orthotic or Prosthetic Training/ Modification Subsequent)

## 2023-01-27 NOTE — Progress Notes (Signed)
Addendum: Reviewed and agree with assessment and management plan. Pyrtle, Jay M, MD  

## 2023-01-30 ENCOUNTER — Encounter: Payer: Self-pay | Admitting: Physical Therapy

## 2023-01-30 ENCOUNTER — Ambulatory Visit: Payer: Medicare HMO | Admitting: Physical Therapy

## 2023-01-30 DIAGNOSIS — M25662 Stiffness of left knee, not elsewhere classified: Secondary | ICD-10-CM

## 2023-01-30 DIAGNOSIS — R2689 Other abnormalities of gait and mobility: Secondary | ICD-10-CM | POA: Diagnosis not present

## 2023-01-30 DIAGNOSIS — M25562 Pain in left knee: Secondary | ICD-10-CM | POA: Diagnosis not present

## 2023-01-31 NOTE — Therapy (Signed)
OUTPATIENT PHYSICAL THERAPY TREATMENT NOTE   Patient Name: GILVERTO ROOKSTOOL MRN: [id number] DOB:1945-09-06, 77 y.o., male Today's Date: 02/01/2023  END OF SESSION:  PT End of Session - 02/01/23 1019     Visit Number 8    Date for PT Re-Evaluation 03/02/23    Authorization Type [employer]    Authorization Time Period 12 visita approved 01/09/23-02/25/23    Authorization - Visit Number 7    Authorization - Number of Visits 12    Progress Note Due on Visit 10    PT Start Time 1018    PT Stop Time 1100    PT Time Calculation (min) 42 min    Activity Tolerance Patient tolerated treatment well;No increased pain    Behavior During Therapy WFL for tasks assessed/performed               Past Medical History:  Diagnosis Date   Adenomatous colon polyp    Arthritis    bilateral knees-needs replacements   Claustrophobia    Colon cancer (HCC) 2015   COVID-19    Dysrhythmia    Eye abnormalities    right eye seen in ED 02/17/2014 wearing eye patch using eye drops   GERD (gastroesophageal reflux disease)    with certain foods   Glaucoma    not on eye drops at this time (10/21/2020)   Hypertension    on meds   Irregular heartbeat    per pt/doesn't know what it does   Osteoarthritis    bilat knees  needs replacement   Osteoporosis    pt denies   Substance abuse (HCC)    stopped over 3 years ago   UC (ulcerative colitis) (HCC)    Vitamin D deficiency    Past Surgical History:  Procedure Laterality Date   APPLICATION OF WOUND VAC  12/16/2022   Procedure: APPLICATION OF WOUND VAC;  Surgeon: Tarry Kos, MD;  Location: [facility];  Service: Orthopedics;;   COLON RESECTION N/A 08/23/2013   Procedure: Laparoscopic total abdominal colectomy and hernia repair;  Surgeon: Romie Levee, MD;  Location: [facility];  Service: General;  Laterality: N/A;   COLON SURGERY  08/2013   COLONOSCOPY  11/02/2020   EUS N/A 07/18/2013   Procedure: UPPER ENDOSCOPIC ULTRASOUND (EUS) RADIAL;  Surgeon:  Rachael Fee, MD;  Location: [facility];  Service: Endoscopy;  Laterality: N/A;   EYE SURGERY     FLEXIBLE SIGMOIDOSCOPY N/A 2020   prep good   HERNIA REPAIR  2005   In [place]/put in a mesh   INCISIONAL HERNIA REPAIR N/A 02/20/2014   Procedure: LAP ASSISTED INCISIONAL HERNIA REPAIR LYSIS OF ADHESIONS;  Surgeon: Romie Levee, MD;  Location: [facility];  Service: General;  Laterality: N/A;  converted to open @ 0935   INCISIONAL HERNIA REPAIR N/A 02/20/2014   Procedure: HERNIA REPAIR INCISIONAL;  Surgeon: Romie Levee, MD;  Location: [facility];  Service: General;  Laterality: N/A;  With MESH   INTRAOCULAR LENS INSERTION Bilateral    6 yrs ago   TOTAL KNEE ARTHROPLASTY Right 11/11/2021   Procedure: RIGHT TOTAL KNEE ARTHROPLASTY;  Surgeon: Tarry Kos, MD;  Location: [facility];  Service: Orthopedics;  Laterality: Right;   TOTAL KNEE ARTHROPLASTY Left 12/16/2022   Procedure: LEFT TOTAL KNEE ARTHROPLASTY;  Surgeon: Tarry Kos, MD;  Location: [facility];  Service: Orthopedics;  Laterality: Left;   VENTRAL HERNIA REPAIR     Patient Active Problem List   Diagnosis Date Noted   Status  post total left knee replacement 12/16/2022   Primary osteoarthritis of left knee 10/26/2022   Schizoaffective disorder, bipolar type (HCC) 09/20/2022   Primary osteoarthritis of right knee 11/11/2021   Status post total right knee replacement 11/11/2021   AKI (acute kidney injury) (HCC) 04/21/2019   Pneumonia due to COVID-19 virus 04/21/2019   Osteoporosis    Ulcerative colitis, with rectal bleeding 08/06/2015   Gastroesophageal reflux disease without esophagitis 08/06/2015   Visual impairment of right eye 06/18/2015   Essential hypertension, benign 06/18/2015   Hernia of abdominal cavity 11/27/2014   Bipolar 1 disorder, mixed, moderate (HCC) 11/27/2014   Cellulitis 08/23/2014   Medically noncompliant 08/23/2014   Left leg cellulitis 08/23/2014   Cellulitis of left leg    Hypokalemia    Diarrhea 05/06/2014   Ulcerative  colitis (HCC) 04/17/2014   Verrucous keratosis 04/17/2014   Nonspecific (abnormal) findings on radiological and other examination of gastrointestinal tract 07/18/2013   Acute esophagitis 07/18/2013   Hypovitaminosis D 05/06/2013   Ulcerative colitis, unspecified 04/04/2013   Knee pain, bilateral 03/05/2013    PCP: Hoy Register, MD  REFERRING PROVIDER: Tarry Kos, MD  REFERRING DIAG: [phone] (ICD-10-CM) - Status post total left knee replacement  THERAPY DIAG:  Left knee pain, unspecified chronicity  Stiffness of left knee, not elsewhere classified  Other abnormalities of gait and mobility  Rationale for Evaluation and Treatment: Rehabilitation  ONSET DATE: 12/16/22  SUBJECTIVE:   SUBJECTIVE STATEMENT: Patient reports no current pain, went to park over the weekend with no issues, no soreness after last session.    PERTINENT HISTORY: HTN, GERD, osteoporosis, s/p L TKA, cancer hx   PAIN:  Are you having pain: 0/10  Location/description: mostly soreness in L knee Best-worst over past week: 0-6/10  - aggravating factors: WB (especially in the morning), bending  - Easing factors: rest, ice    PRECAUTIONS: fall risk   WEIGHT BEARING RESTRICTIONS: No  FALLS:  Has patient fallen in last 6 months? 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, is a Secondary school teacher  PLOF: independent with SPC, uses sock aid for lower body, driving  PATIENT GOALS: get better with movement  NEXT MD VISIT: 4 weeks  OBJECTIVE: (objective measures completed at initial evaluation unless otherwise dated)   DIAGNOSTIC FINDINGS: s/p L TKA 12/16/22  PATIENT SURVEYS:  FOTO 55 current, 62 predicted 01/30/23 FOTO: 60    COGNITION: Overall cognitive status:  Within functional limits for tasks assessed     SENSATION: Light touch intact and symmetrical although pt endorses mild numbness medial calf  EDEMA:  Mild swelling WNL, no eyrthema or drainage noted today  MUSCLE LENGTH: NT  POSTURE: forward flexed trunk, reduced knee ext BIL in static standing  PALPATION: Mild tenderness about L knee joint as expected, no quad/calf tenderness  LOWER EXTREMITY ROM:     Active  Right eval Left eval Left 01/10/23 Left 01/18/23 Left 01/23/23 Left  01/25/23  Hip flexion        Hip extension        Hip internal rotation        Hip external rotation        Knee extension  A: lacking 5 deg A: -4  A: lacking 4   Knee flexion  A: 103  deg AAROM: 112 A: 110 AA: 115 AA: 115  (Blank rows = not tested) (Key: WFL = within functional limits not formally assessed, * = concordant pain, s = stiffness/stretching sensation, NT = not tested)  Comments:    LOWER EXTREMITY MMT:    MMT Right eval Left eval  Hip flexion    Hip abduction (modified sitting)    Hip internal rotation    Hip external rotation    Knee flexion    Knee extension    Ankle dorsiflexion     (Blank rows = not tested) (Key: WFL = within functional limits not formally assessed, * = concordant pain, s = stiffness/stretching sensation, NT = not tested)  Comments: deferred given proximity to surgery  LOWER EXTREMITY SPECIAL TESTS:  deferred  FUNCTIONAL TESTS:  5xSTS: 13.22sec UE support first two reps, noted L knee pain TUG: 14.83sec without AD    GAIT: Distance walked: within clinic Assistive device utilized: Quad cane small base Level of assistance: Modified independence Comments: partial step through pattern, reduced L knee ROM throughout all phases of gait, fwd flexed posture   TODAY'S TREATMENT:       [facility] Adult PT Treatment:                                                DATE: 02/01/23 Therapeutic Exercise: Bike level 3 x 5 mins while gathering subjective info 8" step  ups fwd/lat Lt leading x 20 Slant board calf stretch 2 x 45" Standing TKE with purple band x20 Leg press 65# x25, 75# x5, 85# x10 (pt request) Omega knee extension 5# x25 Omega knee flexion 25# 3x10 Standing heel raises 3x10 Standing toe raises 3x10 SLR Lt with QS 2x10 STS 2x10 - cues for weight shift, Lt foot back Rt forward Modalities: Cold pack x10 mins anterior knee, pt in sitting (post session)   [facility] Adult PT Treatment:                                                DATE: 01/30/23 Therapeutic Exercise: Bike full revolutions 5 min for ROM during subjective STS 10# x10  Green band TKE at counter 2x10  2x10 SLR cues for quad contraction and pacing   Therapeutic Activity: FOTO + education STS from lowest mat x10, x20 cues for symmetry of WB and trunk mechanics    [facility] Adult PT Treatment:                                                DATE: 01/25/23 Therapeutic Exercise: Bike no resistance for ROM full revolutions x 5 min 6" step ups fwd/lat Lt leading x 20 10" stacked steps knee flexion stretch Lt x 20 Slant board calf stretch 2 x 30" Standing mini squat with UE 2x10 Standing TKE with ball Lt 2x10 Omega knee extension 5# 2x10 Omega knee flexion 20# 2x10 Supine heel slide with strap x 10 - Lt 5" hold SLR Lt with QS 2x10 (slight quad lag with second set) Modalities: Cold pack x10 mins anterior knee, pt in sitting (post session)  PATIENT EDUCATION:  Education details: rationale for interventions, HEP  Person educated: Patient Education method: Explanation, Demonstration, Tactile cues, Verbal cues, and Handouts Education comprehension: verbalized understanding, returned demonstration, verbal cues required, tactile cues required, and needs further education    HOME EXERCISE PROGRAM: Access Code: [email or web address] URL: [email or web address] Date: 01/30/2023 Prepared by: Fransisco Hertz  Exercises - Long Sitting Quad Set with Towel Roll Under Heel  - 2-3 x  daily - 7 x weekly - 1 sets - 10 reps - Supine Heel Slide  - 2-3 x daily - 7 x weekly - 1 sets - 10 reps - Seated Long Arc Quad  - 2-3 x daily - 7 x weekly - 1 sets - 10 reps - Sit to Stand with Armchair  - 2-3 x daily - 7 x weekly - 1 sets - 10 reps - Small Range Straight Leg Raise  - 2-3 x daily - 7 x weekly - 1 sets - 5 reps  ASSESSMENT:  CLINICAL IMPRESSION: Patient presents to PT reporting no current pain and no issues after previous session. Session today continued to focus on Lt knee strengthening and ROM. Increased difficulty on some tasks today to good effect, no increase in pain and patient reports that the exercises feel "great." Patient was able to tolerate all prescribed exercises with no adverse effects. Patient continues to benefit from skilled PT services and should be progressed as able to improve functional independence.     OBJECTIVE IMPAIRMENTS: Abnormal gait, decreased activity tolerance, decreased balance, decreased endurance, decreased mobility, difficulty walking, decreased ROM, decreased strength, increased edema, improper body mechanics, postural dysfunction, and pain.   ACTIVITY LIMITATIONS: carrying, lifting, bending, standing, squatting, stairs, transfers, and locomotion level  PARTICIPATION LIMITATIONS: meal prep, cleaning, and laundry  PERSONAL FACTORS: Age, Time since onset of injury/illness/exacerbation, and 3+ comorbidities: HTN, osteoporosis, hx colon cancer  are also affecting patient's functional outcome.    GOALS: Goals reviewed with patient? No  SHORT TERM GOALS: Target date: 02/02/2023 Pt will demonstrate appropriate understanding and performance of initially prescribed HEP in order to facilitate improved independence with management of symptoms.  Baseline: HEP provided on eval Goal status: MET  Pt reports adherence 02/01/23   2. Pt will score greater than or equal to 59 on FOTO in order to demonstrate improved perception of function due to  symptoms.  Baseline: 55  Goal status: MET  01/30/23 60%   LONG TERM GOALS: Target date: 03/02/2023 Pt will score 62 or greater on FOTO in order to demonstrate improved perception of function due to symptoms.  Baseline: 55 Goal status: Progressing   2.  Pt will demonstrate at least 0-120 degrees of left knee AROM in order to facilitate improved tolerance to functional movements such as squatting/walking. Baseline: see ROM chart above Goal status: INITIAL  3. Pt will be able to perform TUG in less than or equal to 11sec sec in order to indicate reduced risk of falling (cutoff score for fall risk 13.5 sec in community dwelling older adults per [facility] et al, 2000)  Baseline: 14sec  Goal status: INITIAL    4.  Pt will be able to perform 5xSTS in less than or equal to 11sec without UE support in order to demonstrate reduced fall risk and improved functional independence (MCID 5xSTS = 2.3 sec). Baseline: 13sec UE support first two reps Goal status: INITIAL   5. Pt will demonstrate symmetrical knee flex/ext MMT in order to facilitate improved functional strength.  Baseline: NT on eval given proximity  to surgery  Goal status: INITIAL  6. Pt will report at least 50% decrease in overall pain levels in past week in order to facilitate improved tolerance to basic ADLs/mobility.   Baseline: 0-6/10  Goal status: INITIAL    7. Pt will demonstrate appropriate performance of final prescribed HEP in order to facilitate improved self-management of symptoms post-discharge.   Baseline: initial HEP prescribed  Goal status: INITIAL     PLAN:  PT FREQUENCY: 2x/week  PT DURATION: 8 weeks  PLANNED INTERVENTIONS: Therapeutic exercises, Therapeutic activity, Neuromuscular re-education, Balance training, Gait training, Patient/Family education, Self Care, Joint mobilization, Stair training, Aquatic Therapy, Dry Needling, Cryotherapy, Moist heat, scar mobilization, Taping, Manual therapy, and  Re-evaluation  PLAN FOR NEXT SESSION: Review/update HEP PRN. Work on [employer] exercises as appropriate with emphasis on quad activation and muscular control throughout ROM. Symptom modification strategies as indicated/appropriate.     Berta Minor PTA 02/01/2023 10:22 AM    Referring diagnosis? N82.956 (ICD-10-CM) - Status post total left knee replacement Treatment diagnosis? (if different than referring diagnosis) Left knee pain, unspecified chronicity  Stiffness of left knee, not elsewhere classified  Other abnormalities of gait and mobility What was this (referring dx) caused by? [x]  Surgery []  Fall []  Ongoing issue []  Arthritis []  Other: ____________  Laterality: []  Rt [x]  Lt []  Both  Check all possible CPT codes:  *CHOOSE 10 OR LESS*    []  97110 (Therapeutic Exercise)  []  21308 (SLP Treatment)  []  97112 (Neuro Re-ed)   []  92526 (Swallowing Treatment)   []  97116 (Gait Training)   []  [id number] (Cognitive Training, 1st 15 minutes) []  97140 (Manual Therapy)   []  97130 (Cognitive Training, each add'l 15 minutes)  []  97164 (Re-evaluation)                              []  Other, List CPT Code ____________  []  97530 (Therapeutic Activities)     []  97535 (Self Care)   [x]  All codes above (97110 - 97535)  []  97012 (Mechanical Traction)  []  97014 (E-stim Unattended)  []  97032 (E-stim manual)  []  97033 (Ionto)  []  97035 (Ultrasound) [x]  97750 (Physical Performance Training) [x]  [id number] (Aquatic Therapy) []  97016 (Vasopneumatic Device) []  [id number] (Paraffin) []  97034 (Contrast Bath) []  97597 (Wound Care 1st 20 sq cm) []  97598 (Wound Care each add'l 20 sq cm) []  97760 (Orthotic Fabrication, Fitting, Training Initial) []  [id number] (Prosthetic Management and Training Initial) []  [id number] (Orthotic or Prosthetic Training/ Modification Subsequent)

## 2023-02-01 ENCOUNTER — Ambulatory Visit: Payer: Medicare HMO

## 2023-02-01 DIAGNOSIS — R2689 Other abnormalities of gait and mobility: Secondary | ICD-10-CM | POA: Diagnosis not present

## 2023-02-01 DIAGNOSIS — M25562 Pain in left knee: Secondary | ICD-10-CM | POA: Diagnosis not present

## 2023-02-01 DIAGNOSIS — M25662 Stiffness of left knee, not elsewhere classified: Secondary | ICD-10-CM | POA: Diagnosis not present

## 2023-02-06 ENCOUNTER — Ambulatory Visit: Payer: Medicare HMO

## 2023-02-06 DIAGNOSIS — M25662 Stiffness of left knee, not elsewhere classified: Secondary | ICD-10-CM

## 2023-02-06 DIAGNOSIS — R2689 Other abnormalities of gait and mobility: Secondary | ICD-10-CM

## 2023-02-06 DIAGNOSIS — M25562 Pain in left knee: Secondary | ICD-10-CM

## 2023-02-06 NOTE — Therapy (Signed)
OUTPATIENT PHYSICAL THERAPY TREATMENT NOTE   Patient Name: Robert Rhodes MRN: [id number] DOB:10/26/1945, 77 y.o., male Today's Date: 02/06/2023  END OF SESSION:  PT End of Session - 02/06/23 1049     Visit Number 9    Date for PT Re-Evaluation 03/02/23    Authorization Type [employer]    Authorization Time Period 12 visita approved 01/09/23-02/25/23    Authorization - Visit Number 8    Authorization - Number of Visits 12    Progress Note Due on Visit 10    PT Start Time 1048    PT Stop Time 1138    PT Time Calculation (min) 50 min    Activity Tolerance Patient tolerated treatment well;No increased pain    Behavior During Therapy WFL for tasks assessed/performed             Past Medical History:  Diagnosis Date   Adenomatous colon polyp    Arthritis    bilateral knees-needs replacements   Claustrophobia    Colon cancer (HCC) 2015   COVID-19    Dysrhythmia    Eye abnormalities    right eye seen in ED 02/17/2014 wearing eye patch using eye drops   GERD (gastroesophageal reflux disease)    with certain foods   Glaucoma    not on eye drops at this time (10/21/2020)   Hypertension    on meds   Irregular heartbeat    per pt/doesn't know what it does   Osteoarthritis    bilat knees  needs replacement   Osteoporosis    pt denies   Substance abuse (HCC)    stopped over 3 years ago   UC (ulcerative colitis) (HCC)    Vitamin D deficiency    Past Surgical History:  Procedure Laterality Date   APPLICATION OF WOUND VAC  12/16/2022   Procedure: APPLICATION OF WOUND VAC;  Surgeon: Tarry Kos, MD;  Location: [facility];  Service: Orthopedics;;   COLON RESECTION N/A 08/23/2013   Procedure: Laparoscopic total abdominal colectomy and hernia repair;  Surgeon: Romie Levee, MD;  Location: [facility];  Service: General;  Laterality: N/A;   COLON SURGERY  08/2013   COLONOSCOPY  11/02/2020   EUS N/A 07/18/2013   Procedure: UPPER ENDOSCOPIC ULTRASOUND (EUS) RADIAL;  Surgeon: Rachael Fee, MD;  Location: [facility];  Service: Endoscopy;  Laterality: N/A;   EYE SURGERY     FLEXIBLE SIGMOIDOSCOPY N/A 2020   prep good   HERNIA REPAIR  2005   In [place]/put in a mesh   INCISIONAL HERNIA REPAIR N/A 02/20/2014   Procedure: LAP ASSISTED INCISIONAL HERNIA REPAIR LYSIS OF ADHESIONS;  Surgeon: Romie Levee, MD;  Location: [facility];  Service: General;  Laterality: N/A;  converted to open @ 0935   INCISIONAL HERNIA REPAIR N/A 02/20/2014   Procedure: HERNIA REPAIR INCISIONAL;  Surgeon: Romie Levee, MD;  Location: [facility];  Service: General;  Laterality: N/A;  With MESH   INTRAOCULAR LENS INSERTION Bilateral    6 yrs ago   TOTAL KNEE ARTHROPLASTY Right 11/11/2021   Procedure: RIGHT TOTAL KNEE ARTHROPLASTY;  Surgeon: Tarry Kos, MD;  Location: [facility];  Service: Orthopedics;  Laterality: Right;   TOTAL KNEE ARTHROPLASTY Left 12/16/2022   Procedure: LEFT TOTAL KNEE ARTHROPLASTY;  Surgeon: Tarry Kos, MD;  Location: [facility];  Service: Orthopedics;  Laterality: Left;   VENTRAL HERNIA REPAIR     Patient Active Problem List   Diagnosis Date Noted   Status post total  left knee replacement 12/16/2022   Primary osteoarthritis of left knee 10/26/2022   Schizoaffective disorder, bipolar type (HCC) 09/20/2022   Primary osteoarthritis of right knee 11/11/2021   Status post total right knee replacement 11/11/2021   AKI (acute kidney injury) (HCC) 04/21/2019   Pneumonia due to COVID-19 virus 04/21/2019   Osteoporosis    Ulcerative colitis, with rectal bleeding 08/06/2015   Gastroesophageal reflux disease without esophagitis 08/06/2015   Visual impairment of right eye 06/18/2015   Essential hypertension, benign 06/18/2015   Hernia of abdominal cavity 11/27/2014   Bipolar 1 disorder, mixed, moderate (HCC) 11/27/2014   Cellulitis 08/23/2014   Medically noncompliant 08/23/2014   Left leg cellulitis 08/23/2014   Cellulitis of left leg    Hypokalemia    Diarrhea 05/06/2014   Ulcerative colitis  (HCC) 04/17/2014   Verrucous keratosis 04/17/2014   Nonspecific (abnormal) findings on radiological and other examination of gastrointestinal tract 07/18/2013   Acute esophagitis 07/18/2013   Hypovitaminosis D 05/06/2013   Ulcerative colitis, unspecified 04/04/2013   Knee pain, bilateral 03/05/2013    PCP: Hoy Register, MD  REFERRING PROVIDER: Tarry Kos, MD  REFERRING DIAG: [phone] (ICD-10-CM) - Status post total left knee replacement  THERAPY DIAG:  Left knee pain, unspecified chronicity  Stiffness of left knee, not elsewhere classified  Other abnormalities of gait and mobility  Rationale for Evaluation and Treatment: Rehabilitation  ONSET DATE: 12/16/22  SUBJECTIVE:   SUBJECTIVE STATEMENT: Patient reports no current pain, mild soreness after last session.    PERTINENT HISTORY: HTN, GERD, osteoporosis, s/p L TKA, cancer hx   PAIN:  Are you having pain: 0/10  Location/description: mostly soreness in L knee Best-worst over past week: 0-6/10  - aggravating factors: WB (especially in the morning), bending  - Easing factors: rest, ice    PRECAUTIONS: fall risk   WEIGHT BEARING RESTRICTIONS: No  FALLS:  Has patient fallen in last 6 months? 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, is a Secondary school teacher  PLOF: independent with SPC, uses sock aid for lower body, driving  PATIENT GOALS: get better with movement  NEXT MD VISIT: 4 weeks  OBJECTIVE: (objective measures completed at initial evaluation unless otherwise dated)   DIAGNOSTIC FINDINGS: s/p L TKA 12/16/22  PATIENT SURVEYS:  FOTO 55 current, 62 predicted 01/30/23 FOTO: 60    COGNITION: Overall cognitive status: Within functional limits for tasks  assessed     SENSATION: Light touch intact and symmetrical although pt endorses mild numbness medial calf  EDEMA:  Mild swelling WNL, no eyrthema or drainage noted today  MUSCLE LENGTH: NT  POSTURE: forward flexed trunk, reduced knee ext BIL in static standing  PALPATION: Mild tenderness about L knee joint as expected, no quad/calf tenderness  LOWER EXTREMITY ROM:     Active  Right eval Left eval Left 01/10/23 Left 01/18/23 Left 01/23/23 Left  01/25/23  Hip flexion        Hip extension        Hip internal rotation        Hip external rotation        Knee extension  A: lacking 5 deg A: -4  A: lacking 4   Knee flexion  A: 103 deg AAROM: 112 A: 110 AA: 115 AA: 115  (Blank  rows = not tested) (Key: WFL = within functional limits not formally assessed, * = concordant pain, s = stiffness/stretching sensation, NT = not tested)  Comments:    LOWER EXTREMITY MMT:    MMT Right eval Left eval  Hip flexion    Hip abduction (modified sitting)    Hip internal rotation    Hip external rotation    Knee flexion    Knee extension    Ankle dorsiflexion     (Blank rows = not tested) (Key: WFL = within functional limits not formally assessed, * = concordant pain, s = stiffness/stretching sensation, NT = not tested)  Comments: deferred given proximity to surgery  LOWER EXTREMITY SPECIAL TESTS:  deferred  FUNCTIONAL TESTS:  5xSTS: 13.22sec UE support first two reps, noted L knee pain TUG: 14.83sec without AD    GAIT: Distance walked: within clinic Assistive device utilized: Quad cane small base Level of assistance: Modified independence Comments: partial step through pattern, reduced L knee ROM throughout all phases of gait, fwd flexed posture   TODAY'S TREATMENT:       [facility] Adult PT Treatment:                                                DATE: 02/06/23 Therapeutic Exercise: Bike level 3 x 5 mins while gathering subjective info Slant board calf stretch 2 x 45" Leg press  85# 3x10 Omega knee extension 10# 2x10 Omega knee flexion 35# 2x10 Standing heel raises on 4" step 3x10 Standing toe raises 3x10 Sled push/pull 20# 2 laps x20 ft STS 2x10 - cues for weight shift, Lt foot back STS with OH press 2000g ball x10 - challenging Modalities: Cold pack x10 mins anterior knee, pt in sitting (post session)   [facility] Adult PT Treatment:                                                DATE: 02/01/23 Therapeutic Exercise: Bike level 3 x 5 mins while gathering subjective info 8" step ups fwd/lat Lt leading x 20 Slant board calf stretch 2 x 45" Standing TKE with purple band x20 Leg press 65# x25, 75# x5, 85# x10 (pt request) Omega knee extension 5# x25 Omega knee flexion 25# 3x10 Standing heel raises 3x10 Standing toe raises 3x10 SLR Lt with QS 2x10 STS 2x10 - cues for weight shift, Lt foot back Rt forward Modalities: Cold pack x10 mins anterior knee, pt in sitting (post session)   [facility] Adult PT Treatment:                                                DATE: 01/30/23 Therapeutic Exercise: Bike full revolutions 5 min for ROM during subjective STS 10# x10  Green band TKE at counter 2x10  2x10 SLR cues for quad contraction and pacing   Therapeutic Activity: FOTO + education STS from lowest mat x10, x20 cues for symmetry of WB and trunk mechanics    PATIENT EDUCATION:  Education details: rationale for interventions, HEP  Person educated: Patient Education method: Explanation, Demonstration, Tactile cues, Verbal cues, and  Handouts Education comprehension: verbalized understanding, returned demonstration, verbal cues required, tactile cues required, and needs further education    HOME EXERCISE PROGRAM: Access Code: [email or web address] URL: [email or web address] Date: 01/30/2023 Prepared by: Fransisco Hertz  Exercises - Long Sitting Quad Set with Towel Roll Under Heel  - 2-3 x daily - 7 x weekly - 1 sets - 10 reps - Supine Heel Slide  - 2-3 x daily - 7 x  weekly - 1 sets - 10 reps - Seated Long Arc Quad  - 2-3 x daily - 7 x weekly - 1 sets - 10 reps - Sit to Stand with Armchair  - 2-3 x daily - 7 x weekly - 1 sets - 10 reps - Small Range Straight Leg Raise  - 2-3 x daily - 7 x weekly - 1 sets - 5 reps  ASSESSMENT:  CLINICAL IMPRESSION: Patient presents to PT reporting no current pain and only mild soreness after previous session. Continued to focus on functional strengthening of Lt knee with increased resistance today as noted above to good effect. Patient was able to tolerate all prescribed exercises with no adverse effects. He had the most difficulty with STS with OH press as he tends to remain flexed at the trunk when standing. Patient continues to benefit from skilled PT services and should be progressed as able to improve functional independence.    OBJECTIVE IMPAIRMENTS: Abnormal gait, decreased activity tolerance, decreased balance, decreased endurance, decreased mobility, difficulty walking, decreased ROM, decreased strength, increased edema, improper body mechanics, postural dysfunction, and pain.   ACTIVITY LIMITATIONS: carrying, lifting, bending, standing, squatting, stairs, transfers, and locomotion level  PARTICIPATION LIMITATIONS: meal prep, cleaning, and laundry  PERSONAL FACTORS: Age, Time since onset of injury/illness/exacerbation, and 3+ comorbidities: HTN, osteoporosis, hx colon cancer  are also affecting patient's functional outcome.    GOALS: Goals reviewed with patient? No  SHORT TERM GOALS: Target date: 02/02/2023 Pt will demonstrate appropriate understanding and performance of initially prescribed HEP in order to facilitate improved independence with management of symptoms.  Baseline: HEP provided on eval Goal status: MET  Pt reports adherence 02/01/23   2. Pt will score greater than or equal to 59 on FOTO in order to demonstrate improved perception of function due to symptoms.  Baseline: 55  Goal status: MET   01/30/23 60%   LONG TERM GOALS: Target date: 03/02/2023 Pt will score 62 or greater on FOTO in order to demonstrate improved perception of function due to symptoms.  Baseline: 55 Goal status: Progressing   2.  Pt will demonstrate at least 0-120 degrees of left knee AROM in order to facilitate improved tolerance to functional movements such as squatting/walking. Baseline: see ROM chart above Goal status: INITIAL  3. Pt will be able to perform TUG in less than or equal to 11sec sec in order to indicate reduced risk of falling (cutoff score for fall risk 13.5 sec in community dwelling older adults per [facility] et al, 2000)  Baseline: 14sec  Goal status: INITIAL    4.  Pt will be able to perform 5xSTS in less than or equal to 11sec without UE support in order to demonstrate reduced fall risk and improved functional independence (MCID 5xSTS = 2.3 sec). Baseline: 13sec UE support first two reps Goal status: INITIAL   5. Pt will demonstrate symmetrical knee flex/ext MMT in order to facilitate improved functional strength.  Baseline: NT on eval given proximity to surgery  Goal status: INITIAL  6. Pt will report at least 50%  decrease in overall pain levels in past week in order to facilitate improved tolerance to basic ADLs/mobility.   Baseline: 0-6/10  Goal status: INITIAL    7. Pt will demonstrate appropriate performance of final prescribed HEP in order to facilitate improved self-management of symptoms post-discharge.   Baseline: initial HEP prescribed  Goal status: INITIAL     PLAN:  PT FREQUENCY: 2x/week  PT DURATION: 8 weeks  PLANNED INTERVENTIONS: Therapeutic exercises, Therapeutic activity, Neuromuscular re-education, Balance training, Gait training, Patient/Family education, Self Care, Joint mobilization, Stair training, Aquatic Therapy, Dry Needling, Cryotherapy, Moist heat, scar mobilization, Taping, Manual therapy, and Re-evaluation  PLAN FOR NEXT SESSION: Review/update  HEP PRN. Work on [employer] exercises as appropriate with emphasis on quad activation and muscular control throughout ROM. Symptom modification strategies as indicated/appropriate.     Berta Minor PTA 02/06/2023 11:28 AM    Referring diagnosis? M57.846 (ICD-10-CM) - Status post total left knee replacement Treatment diagnosis? (if different than referring diagnosis) Left knee pain, unspecified chronicity  Stiffness of left knee, not elsewhere classified  Other abnormalities of gait and mobility What was this (referring dx) caused by? [x]  Surgery []  Fall []  Ongoing issue []  Arthritis []  Other: ____________  Laterality: []  Rt [x]  Lt []  Both  Check all possible CPT codes:  *CHOOSE 10 OR LESS*    []  97110 (Therapeutic Exercise)  []  92507 (SLP Treatment)  []  97112 (Neuro Re-ed)   []  92526 (Swallowing Treatment)   []  97116 (Gait Training)   []  [id number] (Cognitive Training, 1st 15 minutes) []  97140 (Manual Therapy)   []  97130 (Cognitive Training, each add'l 15 minutes)  []  97164 (Re-evaluation)                              []  Other, List CPT Code ____________  []  97530 (Therapeutic Activities)     []  97535 (Self Care)   [x]  All codes above (97110 - 97535)  []  97012 (Mechanical Traction)  []  97014 (E-stim Unattended)  []  97032 (E-stim manual)  []  96295 (Ionto)  []  97035 (Ultrasound) [x]  97750 (Physical Performance Training) [x]  [id number] (Aquatic Therapy) []  97016 (Vasopneumatic Device) []  97018 (Paraffin) []  97034 (Contrast Bath) []  97597 (Wound Care 1st 20 sq cm) []  97598 (Wound Care each add'l 20 sq cm) []  97760 (Orthotic Fabrication, Fitting, Training Initial) []  [id number] (Prosthetic Management and Training Initial) []  [id number] (Orthotic or Prosthetic Training/ Modification Subsequent)

## 2023-02-08 ENCOUNTER — Encounter: Payer: Self-pay | Admitting: Internal Medicine

## 2023-02-08 ENCOUNTER — Ambulatory Visit: Payer: Medicare HMO | Admitting: Physical Therapy

## 2023-02-08 ENCOUNTER — Ambulatory Visit: Payer: Medicare HMO | Admitting: Internal Medicine

## 2023-02-08 VITALS — BP 127/82 | HR 99 | Temp 97.8°F | Resp 12 | Ht 69.0 in | Wt 188.0 lb

## 2023-02-08 DIAGNOSIS — K2210 Ulcer of esophagus without bleeding: Secondary | ICD-10-CM

## 2023-02-08 DIAGNOSIS — K209 Esophagitis, unspecified without bleeding: Secondary | ICD-10-CM | POA: Diagnosis not present

## 2023-02-08 DIAGNOSIS — Z8719 Personal history of other diseases of the digestive system: Secondary | ICD-10-CM

## 2023-02-08 DIAGNOSIS — I10 Essential (primary) hypertension: Secondary | ICD-10-CM | POA: Diagnosis not present

## 2023-02-08 DIAGNOSIS — K219 Gastro-esophageal reflux disease without esophagitis: Secondary | ICD-10-CM | POA: Diagnosis not present

## 2023-02-08 DIAGNOSIS — F259 Schizoaffective disorder, unspecified: Secondary | ICD-10-CM | POA: Diagnosis not present

## 2023-02-08 DIAGNOSIS — F319 Bipolar disorder, unspecified: Secondary | ICD-10-CM | POA: Diagnosis not present

## 2023-02-08 DIAGNOSIS — K648 Other hemorrhoids: Secondary | ICD-10-CM

## 2023-02-08 DIAGNOSIS — K6289 Other specified diseases of anus and rectum: Secondary | ICD-10-CM | POA: Diagnosis not present

## 2023-02-08 MED ORDER — PANTOPRAZOLE SODIUM 40 MG PO TBEC: 40.0000 mg | DELAYED_RELEASE_TABLET | Freq: Two times a day (BID) | ORAL | 1 refills | Status: DC

## 2023-02-08 MED ORDER — SODIUM CHLORIDE 0.9 % IV SOLN
500.0000 mL | INTRAVENOUS | Status: DC
Start: 2023-02-08 — End: 2023-02-08

## 2023-02-08 NOTE — Progress Notes (Signed)
GASTROENTEROLOGY PROCEDURE H&P NOTE   Primary Care Physician: Hoy Register, MD    Reason for Procedure:   GERD, dysphagia, UC s/p subtotal colectomy with ileorectal anastomosis in 2015  Plan:    EGD/colonoscopy  Patient is appropriate for endoscopic procedure(s) in the ambulatory ([facility]) setting.  The nature of the procedure, as well as the risks, benefits, and alternatives were carefully and thoroughly reviewed with the patient. Ample time for discussion and questions allowed. The patient understood, was satisfied, and agreed to proceed.     HPI: TERRIK SCARCE is a 77 y.o. male who presents for EGD/colonsocopy for evaluation of GERD, dysphagia, UC .  Patient was most recently seen in the Gastroenterology Clinic on 01/17/23.  No interval change in medical history since that appointment. Please refer to that note for full details regarding GI history and clinical presentation.   Past Medical History:  Diagnosis Date   Adenomatous colon polyp    Arthritis    bilateral knees-needs replacements   Claustrophobia    Colon cancer (HCC) 2015   COVID-19    Dysrhythmia    Eye abnormalities    right eye seen in ED 02/17/2014 wearing eye patch using eye drops   GERD (gastroesophageal reflux disease)    with certain foods   Glaucoma    not on eye drops at this time (10/21/2020)   Hypertension    on meds   Irregular heartbeat    per pt/doesn't know what it does   Osteoarthritis    bilat knees  needs replacement   Osteoporosis    pt denies   Substance abuse (HCC)    stopped over 3 years ago   UC (ulcerative colitis) (HCC)    Vitamin D deficiency     Past Surgical History:  Procedure Laterality Date   APPLICATION OF WOUND VAC  12/16/2022   Procedure: APPLICATION OF WOUND VAC;  Surgeon: Tarry Kos, MD;  Location: [facility];  Service: Orthopedics;;   COLON RESECTION N/A 08/23/2013   Procedure: Laparoscopic total abdominal colectomy and hernia repair;  Surgeon: Romie Levee, MD;   Location: [facility];  Service: General;  Laterality: N/A;   COLON SURGERY  08/2013   COLONOSCOPY  11/02/2020   EUS N/A 07/18/2013   Procedure: UPPER ENDOSCOPIC ULTRASOUND (EUS) RADIAL;  Surgeon: Rachael Fee, MD;  Location: [facility];  Service: Endoscopy;  Laterality: N/A;   EYE SURGERY     FLEXIBLE SIGMOIDOSCOPY N/A 2020   prep good   HERNIA REPAIR  2005   In [place]/put in a mesh   INCISIONAL HERNIA REPAIR N/A 02/20/2014   Procedure: LAP ASSISTED INCISIONAL HERNIA REPAIR LYSIS OF ADHESIONS;  Surgeon: Romie Levee, MD;  Location: [facility];  Service: General;  Laterality: N/A;  converted to open @ 0935   INCISIONAL HERNIA REPAIR N/A 02/20/2014   Procedure: HERNIA REPAIR INCISIONAL;  Surgeon: Romie Levee, MD;  Location: [facility];  Service: General;  Laterality: N/A;  With MESH   INTRAOCULAR LENS INSERTION Bilateral    6 yrs ago   TOTAL KNEE ARTHROPLASTY Right 11/11/2021   Procedure: RIGHT TOTAL KNEE ARTHROPLASTY;  Surgeon: Tarry Kos, MD;  Location: [facility];  Service: Orthopedics;  Laterality: Right;   TOTAL KNEE ARTHROPLASTY Left 12/16/2022   Procedure: LEFT TOTAL KNEE ARTHROPLASTY;  Surgeon: Tarry Kos, MD;  Location: [facility];  Service: Orthopedics;  Laterality: Left;   VENTRAL HERNIA REPAIR      Prior to Admission medications   Medication Sig Start  Date End Date Taking? Authorizing Provider  amLODipine (NORVASC) 5 MG tablet TAKE 1 TABLET BY MOUTH ONCE DAILY- HYPERTENSION 09/20/22   Hoy Register, MD  atorvastatin (LIPITOR) 20 MG tablet Take 1 tablet (20 mg total) by mouth daily. Patient not taking: Reported on 01/17/2023 09/20/22   Hoy Register, MD  docusate sodium (COLACE) 100 MG capsule Take 1 capsule (100 mg total) by mouth daily as needed. Patient not taking: Reported on 01/17/2023 12/12/22 12/12/23  Cristie Hem, PA-C  Homeopathic Products (ARNICA EX) Apply 1 Application topically daily as needed (bruising). Patient not taking: Reported on 01/17/2023    [provider]  MAGNESIUM PO  Take 1 tablet by mouth daily.    [provider]  methocarbamol (ROBAXIN-750) 750 MG tablet Take 1 tablet (750 mg total) by mouth 2 (two) times daily as needed for muscle spasms. Patient not taking: Reported on 01/17/2023 12/21/22   Cristie Hem, PA-C  ondansetron (ZOFRAN) 4 MG tablet Take 1 tablet (4 mg total) by mouth every 8 (eight) hours as needed for nausea or vomiting. Patient not taking: Reported on 01/17/2023 12/12/22   Cristie Hem, PA-C  oxyCODONE-acetaminophen (PERCOCET) 5-325 MG tablet Take 1-2 tablets by mouth 3 (three) times daily as needed. Patient not taking: Reported on 01/17/2023 12/21/22   Cristie Hem, PA-C  POTASSIUM PO Take 1 tablet by mouth daily.    [provider]  propranolol (INDERAL) 10 MG tablet Take 10 mg by mouth daily as needed (claustrophobia). Patient not taking: Reported on 01/17/2023    [provider]  rivaroxaban (XARELTO) 10 MG TABS tablet Take 1 tablet (10 mg total) by mouth daily. To be taken after surgery to prevent blood clots Patient not taking: Reported on 01/17/2023 12/12/22   Cristie Hem, PA-C  VITAMIN D PO Take 1 tablet by mouth 2 (two) times a week. Patient not taking: Reported on 01/17/2023    [provider]    Current Outpatient Medications  Medication Sig Dispense Refill   amLODipine (NORVASC) 5 MG tablet TAKE 1 TABLET BY MOUTH ONCE DAILY- HYPERTENSION 90 tablet 1   atorvastatin (LIPITOR) 20 MG tablet Take 1 tablet (20 mg total) by mouth daily. (Patient not taking: Reported on 01/17/2023) 90 tablet 1   docusate sodium (COLACE) 100 MG capsule Take 1 capsule (100 mg total) by mouth daily as needed. (Patient not taking: Reported on 01/17/2023) 30 capsule 2   Homeopathic Products (ARNICA EX) Apply 1 Application topically daily as needed (bruising). (Patient not taking: Reported on 01/17/2023)     MAGNESIUM PO Take 1 tablet by mouth daily.     methocarbamol (ROBAXIN-750) 750 MG tablet Take 1 tablet (750 mg total) by  mouth 2 (two) times daily as needed for muscle spasms. (Patient not taking: Reported on 01/17/2023) 20 tablet 2   ondansetron (ZOFRAN) 4 MG tablet Take 1 tablet (4 mg total) by mouth every 8 (eight) hours as needed for nausea or vomiting. (Patient not taking: Reported on 01/17/2023) 40 tablet 0   oxyCODONE-acetaminophen (PERCOCET) 5-325 MG tablet Take 1-2 tablets by mouth 3 (three) times daily as needed. (Patient not taking: Reported on 01/17/2023) 40 tablet 0   POTASSIUM PO Take 1 tablet by mouth daily.     propranolol (INDERAL) 10 MG tablet Take 10 mg by mouth daily as needed (claustrophobia). (Patient not taking: Reported on 01/17/2023)     rivaroxaban (XARELTO) 10 MG TABS tablet Take 1 tablet (10 mg total) by mouth daily. To be  taken after surgery to prevent blood clots (Patient not taking: Reported on 01/17/2023) 30 tablet 0   VITAMIN D PO Take 1 tablet by mouth 2 (two) times a week. (Patient not taking: Reported on 01/17/2023)     Current Facility-Administered Medications  Medication Dose Route Frequency Provider Last Rate Last Admin   0.9 %  sodium chloride infusion  500 mL Intravenous Continuous Imogene Burn, MD        Allergies as of 02/08/2023 - Review Complete 02/08/2023  Allergen Reaction Noted   Ace inhibitors Other (See Comments) 08/19/2013   Advil [ibuprofen] Other (See Comments) 03/05/2013   Aspirin Nausea And Vomiting 03/05/2013   Tylenol [acetaminophen] Other (See Comments) 03/05/2013    Family History  Problem Relation Age of Onset   Cancer Mother        type unknown-in her leg   Diabetes Father    Heart disease Father    Heart disease Sister    Heart attack Brother    Healthy Sister    Colon cancer Neg Hx    Esophageal cancer Neg Hx    Rectal cancer Neg Hx    Stomach cancer Neg Hx    Colon polyps Neg Hx     Social History   Socioeconomic History   Marital status: Single    Spouse name: Not on file   Number of children: 0   Years of education: Not on file    Highest education level: GED or equivalent  Occupational History   Occupation: retired  Tobacco Use   Smoking status: Former    Current packs/day: 0.00    Types: Cigarettes    Quit date: 06/14/2003    Years since quitting: 19.6   Smokeless tobacco: Never   Tobacco comments:    quit in July 2005  Vaping Use   Vaping status: Never Used  Substance and Sexual Activity   Alcohol use: No   Drug use: Not Currently    Types: 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  Other Topics Concern   Not on file  Social History Narrative   Not on file   Social Determinants of Health   Financial Resource Strain: Medium Risk (10/26/2022)   Overall Financial Resource Strain (CARDIA)    Difficulty of Paying Living Expenses: Somewhat hard  Food Insecurity: Food Insecurity Present (10/26/2022)   Hunger Vital Sign    Worried About Running Out of Food in the Last Year: Sometimes true    Ran Out of Food in the Last Year: Sometimes true  Transportation Needs: No Transportation Needs (10/26/2022)   PRAPARE - Administrator, Civil Service (Medical): No    Lack of Transportation (Non-Medical): No  Recent Concern: Transportation Needs - Unmet Transportation Needs (09/16/2022)   PRAPARE - Transportation    Lack of Transportation (Medical): No    Lack of Transportation (Non-Medical): Yes  Physical Activity: Sufficiently Active (10/26/2022)   Exercise Vital Sign    Days of Exercise per Week: 5 days    Minutes of Exercise per Session: 30 min  Stress: No Stress Concern Present (10/26/2022)   [employer] of Occupational Health - Occupational Stress Questionnaire    Feeling of Stress : Not at all  Social Connections: Moderately Isolated (10/26/2022)   Social Connection and Isolation Panel [NHANES]    Frequency of Communication with Friends and Family: Three times a week    Frequency of Social Gatherings with Friends and Family:  Once a week    Attends Religious  Services: Never    Active Member of Clubs or Organizations: Yes    Attends Banker Meetings: More than 4 times per year    Marital Status: Never married  Intimate Partner Violence: Not At Risk (10/26/2022)   Humiliation, Afraid, Rape, and Kick questionnaire    Fear of Current or Ex-Partner: No    Emotionally Abused: No    Physically Abused: No    Sexually Abused: No    Physical Exam: Vital signs in last 24 hours: BP (!) 148/78   Pulse 73   Temp 97.8 F (36.6 C) (Temporal)   Ht 5\' 9"  (1.753 m)   Wt 188 lb (85.3 kg)   SpO2 100%   BMI 27.76 kg/m  GEN: NAD EYE: Sclerae anicteric ENT: MMM CV: Non-tachycardic Pulm: No increased WOB GI: Soft NEURO:  Alert & Oriented   Eulah Pont, MD [facility]   02/08/2023 2:32 PM

## 2023-02-08 NOTE — Progress Notes (Signed)
Called to room to assist during endoscopic procedure.  Patient ID and intended procedure confirmed with present staff. Received instructions for my participation in the procedure from the performing physician.  

## 2023-02-08 NOTE — Op Note (Signed)
[facility] Patient Name: Robert Rhodes Procedure Date: 02/08/2023 2:43 PM MRN: [id number] Endoscopist: Madelyn Brunner [place] , , [phone] Age: 77 Referring MD:  Date of Birth: 06-21-1945 Gender: Male Account #: [id number] Procedure:                Flexible Sigmoidoscopy Indications:              Personal history of ulcerative colitis Medicines:                Monitored Anesthesia Care Procedure:                Pre-Anesthesia Assessment:                           - Prior to the procedure, a History and Physical                            was performed, and patient medications and                            allergies were reviewed. The patient's tolerance of                            previous anesthesia was also reviewed. The risks                            and benefits of the procedure and the sedation                            options and risks were discussed with the patient.                            All questions were answered, and informed consent                            was obtained. Prior Anticoagulants: The patient has                            taken no anticoagulant or antiplatelet agents. ASA                            Grade Assessment: II - A patient with mild systemic                            disease. After reviewing the risks and benefits,                            the patient was deemed in satisfactory condition to                            undergo the procedure.                           After obtaining informed consent, the scope was  passed under direct vision. The Olympus Scope                            [id number] was introduced through the anus and                            advanced to the the ileo-rectal anastomosis. The                            flexible sigmoidoscopy was accomplished without                            difficulty. The patient tolerated the procedure                            well. Scope In:  2:55:48 PM Scope Out: 3:01:03 PM Total Procedure Duration: 0 hours 5 minutes 15 seconds  Findings:                 There was evidence of a prior end-to-side                            ileo-rectal anastomosis in the rectum. This was                            patent and was characterized by healthy appearing                            mucosa. The anastomosis was traversed.                           Normal terminal ileum.                           Inflammation was not found based on the endoscopic                            appearance of the mucosa in the colon. This was                            graded as Mayo Score 0 (normal or inactive                            disease), and when compared to the previous                            examination, the findings are improved. Biopsies                            were taken with a cold forceps for histology.                           Non-bleeding internal hemorrhoids were found during  retroflexion. Complications:            No immediate complications. Estimated Blood Loss:     Estimated blood loss was minimal. Impression:               - Patent end-to-side ileo-rectal anastomosis,                            characterized by healthy appearing mucosa.                           - Normal terminal ileum.                           - Inactive (Mayo Score 0) ulcerative colitis,                            improved since the last examination. Biopsied.                           - Internal hemorrhoids. Recommendation:           - Discharge patient to home (with escort).                           - Await pathology results.                           - The findings and recommendations were discussed                            with the patient. Dr [name] "[name]" [name],  02/08/2023 3:15:58 PM

## 2023-02-08 NOTE — Op Note (Signed)
[facility] Patient Name: Robert Rhodes Procedure Date: 02/08/2023 2:44 PM MRN: [id number] Endoscopist: Madelyn Brunner [place] , , [phone] Age: 77 Referring MD:  Date of Birth: 05-15-1946 Gender: Male Account #: [id number] Procedure:                Upper GI endoscopy Indications:              Dysphagia, Heartburn Medicines:                Monitored Anesthesia Care Procedure:                Pre-Anesthesia Assessment:                           - Prior to the procedure, a History and Physical                            was performed, and patient medications and                            allergies were reviewed. The patient's tolerance of                            previous anesthesia was also reviewed. The risks                            and benefits of the procedure and the sedation                            options and risks were discussed with the patient.                            All questions were answered, and informed consent                            was obtained. Prior Anticoagulants: The patient has                            taken no anticoagulant or antiplatelet agents. ASA                            Grade Assessment: II - A patient with mild systemic                            disease. After reviewing the risks and benefits,                            the patient was deemed in satisfactory condition to                            undergo the procedure.                           After obtaining informed consent, the endoscope was  passed under direct vision. Throughout the                            procedure, the patient's blood pressure, pulse, and                            oxygen saturations were monitored continuously. The                            Olympus Scope [id number] was introduced through the                            mouth, and advanced to the second part of duodenum.                            The upper GI endoscopy  was accomplished without                            difficulty. The patient tolerated the procedure                            well. Scope In: Scope Out: Findings:                 LA Grade C (one or more mucosal breaks continuous                            between tops of 2 or more mucosal folds, less than                            75% circumference) esophagitis with no bleeding was                            found in the distal esophagus. Biopsies were taken                            with a cold forceps for histology.                           A large hiatal hernia was present.                           Localized mildly erythematous mucosa without                            bleeding was found in the gastric antrum. Biopsies                            were taken with a cold forceps for histology.                           The examined duodenum was normal. Complications:            No immediate complications. Estimated Blood Loss:     Estimated blood loss was minimal. Impression:               -  LA Grade C esophagitis with no bleeding. Biopsied.                           - Large hiatal hernia.                           - Erythematous mucosa in the antrum. Biopsied.                           - Normal examined duodenum. Recommendation:           - Use Protonix (pantoprazole) 40 mg PO BID for 10                            weeks.                           - Repeat upper endoscopy in 2 months to check                            healing.                           - Await pathology results.                           - Perform a flexible sigmoidoscopy today. Dr [name] "[name]" [name],  02/08/2023 3:11:32 PM

## 2023-02-08 NOTE — Patient Instructions (Signed)
Please read handouts provided. Await pathology results/ Repeat EGD in 2 months ( Oct. 29 ). Protonix 40 mg twice daily for 10 weeks.   YOU HAD AN ENDOSCOPIC PROCEDURE TODAY AT THE [facility]:   Refer to the procedure report that was given to you for any specific questions about what was found during the examination.  If the procedure report does not answer your questions, please call your gastroenterologist to clarify.  If you requested that your care partner not be given the details of your procedure findings, then the procedure report has been included in a sealed envelope for you to review at your convenience later.  YOU SHOULD EXPECT: Some feelings of bloating in the abdomen. Passage of more gas than usual.  Walking can help get rid of the air that was put into your GI tract during the procedure and reduce the bloating. If you had a lower endoscopy (such as a colonoscopy or flexible sigmoidoscopy) you may notice spotting of blood in your stool or on the toilet paper. If you underwent a bowel prep for your procedure, you may not have a normal bowel movement for a few days.  Please Note:  You might notice some irritation and congestion in your nose or some drainage.  This is from the oxygen used during your procedure.  There is no need for concern and it should clear up in a day or so.  SYMPTOMS TO REPORT IMMEDIATELY:  Following lower endoscopy (colonoscopy or flexible sigmoidoscopy):  Excessive amounts of blood in the stool  Significant tenderness or worsening of abdominal pains  Swelling of the abdomen that is new, acute  Fever of 100F or higher  Following upper endoscopy (EGD)  Vomiting of blood or coffee ground material  New chest pain or pain under the shoulder blades  Painful or persistently difficult swallowing  New shortness of breath  Fever of 100F or higher  Black, tarry-looking stools  For urgent or emergent issues, a gastroenterologist can be reached at any  hour by calling [phone]. Do not use MyChart messaging for urgent concerns.    DIET:  We do recommend a small meal at first, but then you may proceed to your regular diet.  Drink plenty of fluids but you should avoid alcoholic beverages for 24 hours.  ACTIVITY:  You should plan to take it easy for the rest of today and you should NOT DRIVE or use heavy machinery until tomorrow (because of the sedation medicines used during the test).    FOLLOW UP: Our staff will call the number listed on your records the next business day following your procedure.  We will call around 7:15- 8:00 am to check on you and address any questions or concerns that you may have regarding the information given to you following your procedure. If we do not reach you, we will leave a message.     If any biopsies were taken you will be contacted by phone or by letter within the next 1-3 weeks.  Please call us at [phone] if you have not heard about the biopsies in 3 weeks.    SIGNATURES/CONFIDENTIALITY: You and/or your care partner have signed paperwork which will be entered into your electronic medical record.  These signatures attest to the fact that that the information above on your After Visit Summary has been reviewed and is understood.  Full responsibility of the confidentiality of this discharge information lies with you and/or your care-partner.

## 2023-02-08 NOTE — Progress Notes (Signed)
Sedate, gd SR, tolerated procedure well, VSS, report to RN 

## 2023-02-09 ENCOUNTER — Telehealth: Payer: Self-pay

## 2023-02-09 NOTE — Telephone Encounter (Signed)
Follow up call placed, VM obtained and message left. 

## 2023-02-14 DIAGNOSIS — H40013 Open angle with borderline findings, low risk, bilateral: Secondary | ICD-10-CM | POA: Diagnosis not present

## 2023-02-14 DIAGNOSIS — H40052 Ocular hypertension, left eye: Secondary | ICD-10-CM | POA: Diagnosis not present

## 2023-02-15 ENCOUNTER — Other Ambulatory Visit: Payer: Self-pay | Admitting: Internal Medicine

## 2023-02-15 ENCOUNTER — Encounter: Payer: Self-pay | Admitting: Internal Medicine

## 2023-02-15 DIAGNOSIS — Z9049 Acquired absence of other specified parts of digestive tract: Secondary | ICD-10-CM

## 2023-02-15 DIAGNOSIS — Z8719 Personal history of other diseases of the digestive system: Secondary | ICD-10-CM

## 2023-02-15 NOTE — Progress Notes (Signed)
Hi Robert Rhodes, please let the patient know that his biopsies showed inflammation in the esophagus that is most likely due to acid reflux as well as some mildly active inflammation in the rectum. He may benefit from restarting his Canasa suppositories to help with the inflammation in the rectum. The benefit of restarting the suppositories would be slightly reducing his risk for developing cancer in the rectum in the future (he is asymptomatic per Amy's last note).

## 2023-02-17 ENCOUNTER — Other Ambulatory Visit: Payer: Self-pay

## 2023-02-17 MED ORDER — MESALAMINE 1000 MG RE SUPP: 1000.0000 mg | Freq: Every day | RECTAL | 12 refills | Status: DC

## 2023-02-20 ENCOUNTER — Encounter: Payer: Self-pay | Admitting: Physical Therapy

## 2023-02-20 ENCOUNTER — Ambulatory Visit: Payer: Medicare HMO | Attending: Orthopaedic Surgery | Admitting: Physical Therapy

## 2023-02-20 DIAGNOSIS — R2689 Other abnormalities of gait and mobility: Secondary | ICD-10-CM | POA: Diagnosis not present

## 2023-02-20 DIAGNOSIS — M25562 Pain in left knee: Secondary | ICD-10-CM | POA: Insufficient documentation

## 2023-02-20 DIAGNOSIS — M25662 Stiffness of left knee, not elsewhere classified: Secondary | ICD-10-CM | POA: Insufficient documentation

## 2023-02-20 NOTE — Therapy (Signed)
OUTPATIENT PHYSICAL THERAPY PROGRESS NOTE + DISCHARGE   Patient Name: Robert Rhodes MRN: [id number] DOB:07/07/1945, 77 y.o., male Today's Date: 02/20/2023   PHYSICAL THERAPY DISCHARGE SUMMARY  Visits from Start of Care: 10  Current functional level related to goals / functional outcomes: Able to perform household/community navigation without limitations; active in gym; occasional SPC use   Remaining deficits: Mild quad weakness   Education / Equipment: HEP, discharge education, PT goals/POC, follow up with provider   Patient agrees to discharge. Patient goals were mostly met. Patient is being discharged due to being pleased with the current functional level.    Progress Note Reporting Period 01/05/23 to 02/20/23  See note below for Objective Data and Assessment of Progress/Goals.      END OF SESSION:  PT End of Session - 02/20/23 0932     Visit Number 10    Date for PT Re-Evaluation 03/02/23    Authorization Type [employer]    Authorization Time Period 12 visits approved 01/09/23-02/25/23    Authorization - Visit Number 9    Authorization - Number of Visits 12    Progress Note Due on Visit 20    PT Start Time 0933    PT Stop Time 1016    PT Time Calculation (min) 43 min    Activity Tolerance Patient tolerated treatment well    Behavior During Therapy WFL for tasks assessed/performed              Past Medical History:  Diagnosis Date   Adenomatous colon polyp    Arthritis    bilateral knees-needs replacements   Claustrophobia    Colon cancer (HCC) 2015   COVID-19    Dysrhythmia    Eye abnormalities    right eye seen in ED 02/17/2014 wearing eye patch using eye drops   GERD (gastroesophageal reflux disease)    with certain foods   Glaucoma    not on eye drops at this time (10/21/2020)   Hypertension    on meds   Irregular heartbeat    per pt/doesn't know what it does   Osteoarthritis    bilat knees  needs replacement   Osteoporosis    pt  denies   Substance abuse (HCC)    stopped over 3 years ago   UC (ulcerative colitis) (HCC)    Vitamin D deficiency    Past Surgical History:  Procedure Laterality Date   APPLICATION OF WOUND VAC  12/16/2022   Procedure: APPLICATION OF WOUND VAC;  Surgeon: Tarry Kos, MD;  [facility];  Service: Orthopedics;;   COLON RESECTION N/A 08/23/2013   Procedure: Laparoscopic total abdominal colectomy and hernia repair;  Surgeon: Romie Levee, MD;  Location: [facility];  Service: General;  Laterality: N/A;   COLON SURGERY  08/2013   COLONOSCOPY  11/02/2020   EUS N/A 07/18/2013   Procedure: UPPER ENDOSCOPIC ULTRASOUND (EUS) RADIAL;  Surgeon: Rachael Fee, MD;  Location: [facility];  Service: Endoscopy;  Laterality: N/A;   EYE SURGERY     FLEXIBLE SIGMOIDOSCOPY N/A 2020   prep good   HERNIA REPA[place]  2005   [place]   INCISIONAL HERNIA REPAIR N/A 02/20/2014   Procedure: LAP ASSISTED INCISIONAL HERNIA REPAIR LYSIS OF ADHESIONS;  Surgeon: Romie Levee, MD;  Location: [facility];  Service: General;  Laterality: N/A;  converted to open @ 0935   INCISIONAL HERNIA REPAIR N/A 02/20/2014   Procedure: HERNIA REPAIR INCISIONAL;  Surgeon: Romie Levee, MD;  [facility]: [facility];  Service: General;  Laterality: N/A;  With MESH   INTRAOCULAR LENS INSERTION Bilateral    6 yrs ago   TOTAL KNEE ARTHROPLASTY Right 11/11/2021   Procedure: RIGHT TOTAL KNEE ARTHROPLASTY;  Surgeon: Tarry Kos, MD;  [facility]: [facility];  Service: Orthopedics;  Laterality: Right;   TOTAL KNEE ARTHROPLASTY Left 12/16/2022   Procedure: LEFT TOTAL KNEE ARTHROPLASTY;  Surgeon: Tarry Kos, MD;  [facility];  Service: Orthopedics;  Laterality: Left;   VENTRAL HERNIA REPAIR     Patient Active Problem List   Diagnosis Date Noted   Status post total left knee replacement 12/16/2022   Primary osteoarthritis of left knee 10/26/2022   Schizoaffective disorder, bipolar type (HCC) 09/20/2022   Primary osteoarthritis of right knee  11/11/2021   Status post total right knee replacement 11/11/2021   AKI (acute kidney injury) (HCC) 04/21/2019   Pneumonia due to COVID-19 virus 04/21/2019   Osteoporosis    Ulcerative colitis, with rectal bleeding 08/06/2015   Gastroesophageal reflux disease without esophagitis 08/06/2015   Visual impairment of right eye 06/18/2015   Essential hypertension, benign 06/18/2015   Hernia of abdominal cavity 11/27/2014   Bipolar 1 disorder, mixed, moderate (HCC) 11/27/2014   Cellulitis 08/23/2014   Medically noncompliant 08/23/2014   Left leg cellulitis 08/23/2014   Cellulitis of left leg    Hypokalemia    Diarrhea 05/06/2014   Ulcerative colitis (HCC) 04/17/2014   Verrucous keratosis 04/17/2014   Nonspecific (abnormal) findings on radiological and other examination of gastrointestinal tract 07/18/2013   Acute esophagitis 07/18/2013   Hypovitaminosis D 05/06/2013   Ulcerative colitis, unspecified 04/04/2013   Knee pain, bilateral 03/05/2013    PCP: Hoy Register, MD  REFERRING PROVIDER: Tarry Kos, MD  REFERRING DIAG: [phone] (ICD-10-CM) - Status post total left knee replacement  THERAPY DIAG:  Left knee pain, unspecified chronicity  Stiffness of left knee, not elsewhere classified  Other abnormalities of gait and mobility  Rationale for Evaluation and Treatment: Rehabilitation  ONSET DATE: 12/16/22  SUBJECTIVE:   SUBJECTIVE STATEMENT: Pt states he hasn't had any pain in quite some time, has been doing well with exercises in sessions and at gym. Keeps his [facility] with him but doesn't really use it much. Denies any limitations in daily activities or mobility, states he feels confident discharging today    PERTINENT HISTORY: HTN, GERD, osteoporosis, s/p L TKA, cancer hx   PAIN:  Are you having pain: 0/10  Location/description: mostly soreness in L knee Pt denies any pain in last week 02/20/23  Per eval - Best-worst over past week: 0-6/10  - aggravating factors: WB  (especially in the morning), bending  - Easing factors: rest, ice    PRECAUTIONS: fall risk   WEIGHT BEARING RESTRICTIONS: No  FALLS:  Has patient fallen in last 6 months? 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, is a Secondary school teacher  PLOF: independent with SPC, uses sock aid for lower body, driving  PATIENT GOALS: get better with movement  NEXT MD VISIT: 4 weeks  OBJECTIVE: (objective measures completed at initial evaluation unless otherwise dated)   DIAGNOSTIC FINDINGS: s/p L TKA 12/16/22  PATIENT SURVEYS:  FOTO 55 current, 62 predicted 01/30/23 FOTO: 60   02/20/23: 73  COGNITION: Overall cognitive  status: Within functional limits for tasks assessed     SENSATION: Light touch intact and symmetrical although pt endorses mild numbness medial calf  EDEMA:  Mild swelling WNL, no eyrthema or drainage noted today  MUSCLE LENGTH: NT  POSTURE: forward flexed trunk, reduced knee ext BIL in static standing  PALPATION: Mild tenderness about L knee joint as expected, no quad/calf tenderness  LOWER EXTREMITY ROM:     Active  Right eval Left eval Left 01/10/23 Left 01/18/23 Left 01/23/23 Left  01/25/23 Left 02/20/23  Hip flexion         Hip extension         Hip internal rotation         Hip external rotation         Knee extension  A: lacking 5 deg A: -4  A: lacking 4    Knee flexion  A: 103 deg AAROM: 112 A: 110 AA: 115 AA: 115 A: 113 deg before activity  118 deg after exercise  (Blank rows = not tested) (Key: WFL = within functional limits not formally assessed, * = concordant pain, s = stiffness/stretching sensation, NT = not tested)  Comments:    LOWER EXTREMITY MMT:    MMT Right 02/20/23 Left 02/20/23  Hip flexion    Hip  abduction (modified sitting)    Hip internal rotation    Hip external rotation    Knee flexion 5 5  Knee extension 5 4+   Ankle dorsiflexion     (Blank rows = not tested) (Key: WFL = within functional limits not formally assessed, * = concordant pain, s = stiffness/stretching sensation, NT = not tested)  Comments: deferred given proximity to surgery  LOWER EXTREMITY SPECIAL TESTS:  deferred  FUNCTIONAL TESTS:  5xSTS: 13.22sec UE support first two reps, noted L knee pain TUG: 14.83sec without AD    02/20/23:  - 5xSTS 8 sec no UE support or pain   - TUG 8.70 sec no AD or instability  GAIT: Distance walked: within clinic Assistive device utilized: Quad cane small base Level of assistance: Modified independence Comments: partial step through pattern, reduced L knee ROM throughout all phases of gait, fwd flexed posture   TODAY'S TREATMENT:       [facility] Adult PT Treatment:                                                DATE: 02/20/23 Therapeutic Exercise: Recumbent bike warm up 4 min  Omega knee extensions single leg 10# 2x10 LLE only  SLR x10 Heel slide x10 supine TKE blue band x10 (modified to version with ball at home given some difficulty with independent setup) HEP handout + education  Therapeutic Activity: MSK assessment + education FOTO + education 5xSTS + education TUG + education Education/discussion re: progress with PT, symptom behavior as it affects activity tolerance, PT goals/POC, discharge education and follow up with provider     PATIENT EDUCATION:  Education details: rationale for interventions, HEP, progress with PT, PT goals/POC, d/c education, follow up with provider  Person educated: Patient Education method: Explanation, Demonstration, Tactile cues, Verbal cues, and Handouts Education comprehension: verbalized understanding, returned demonstration, verbal cues required, tactile cues required, and needs further education    HOME EXERCISE  PROGRAM: Access Code: [email or web address] URL: [email or web address] Date: 02/20/2023 Prepared by: Fransisco Hertz  Exercises - Sit to  Stand with Armchair  - 2-3 x daily - 7 x weekly - 1 sets - 10 reps - Small Range Straight Leg Raise  - 2-3 x daily - 7 x weekly - 1 sets - 5 reps - Supine Heel Slide  - 2-3 x daily - 7 x weekly - 1 sets - 10 reps - Standing Terminal Knee Extension at Wall with Ball  - 2-3 x daily - 7 x weekly - 1 sets - 10 reps  ASSESSMENT:  CLINICAL IMPRESSION: 02/20/2023 Pt arrives and denies any pain recently, states he is able to perform typical activities without limitation and has been active with HEP, gym, and walking in park. He states he feels confident discharging today. Looking at goals he has progressed quite well, mild quad weakness on surgical limb relative to non-surgical limb and mild knee flexion mobility deficits but asymptomatic and WFL, just shy of goal (120 deg goal) after warming up with activity (up to 118 deg).  5xSTS and TUG times much improved and do not indicate fall risk. Does well with HEP review and education on appropriate activity at gym (practicing single leg knee extension with appropriate weight, 10# today) with focus on improving quad symmetry. Also has modification for TKE using ball given difficulty independently setting up banded TKE. Education on safety w/ activity, HEP performance, and follow up with surgeon as he states he has not seen them since he started PT. Pt verbalizes agreement/understanding with plan to d/c to independent HEP and follow up with provider. No adverse events or pain during today's session, pt departs in no acute distress with all voiced questions/concerns addressed appropriately from PT perspective.     OBJECTIVE IMPAIRMENTS: Abnormal gait, decreased strength, increased edema, improper body mechanics, and postural dysfunction.   ACTIVITY LIMITATIONS: carrying, lifting, bending, standing, squatting, stairs, transfers,  and locomotion level  PARTICIPATION LIMITATIONS: meal prep, cleaning, and laundry  PERSONAL FACTORS: Age, Time since onset of injury/illness/exacerbation, and 3+ comorbidities: HTN, osteoporosis, hx colon cancer  are also affecting patient's functional outcome.    GOALS: Goals reviewed with patient? No  SHORT TERM GOALS: Target date: 02/02/2023 Pt will demonstrate appropriate understanding and performance of initially prescribed HEP in order to facilitate improved independence with management of symptoms.  Baseline: HEP provided on eval Goal status: MET  Pt reports adherence 02/01/23   2. Pt will score greater than or equal to 59 on FOTO in order to demonstrate improved perception of function due to symptoms.  Baseline: 55  Goal status: MET  01/30/23 60%   LONG TERM GOALS: Target date: 03/02/2023 Pt will score 62 or greater on FOTO in order to demonstrate improved perception of function due to symptoms.  Baseline: 55 02/20/23: 73 Goal status: MET  2.  Pt will demonstrate at least 0-120 degrees of left knee AROM in order to facilitate improved tolerance to functional movements such as squatting/walking. Baseline: see ROM chart above 02/20/23: 113 active flexion pre exercise, 118 post Goal status: NOT MET  3. Pt will be able to perform TUG in less than or equal to 11sec sec in order to indicate reduced risk of falling (cutoff score for fall risk 13.5 sec in community dwelling older adults per [facility] et al, 2000)  Baseline: 14sec  02/20/23: 8 sec no AD  Goal status: MET   4.  Pt will be able to perform 5xSTS in less than or equal to 11sec without UE support in order to demonstrate reduced fall risk and improved functional independence (  MCID 5xSTS = 2.3 sec). Baseline: 13sec UE support first two reps 02/20/23: 8 sec no UE support or pain Goal status: MET  5. Pt will demonstrate symmetrical knee flex/ext MMT in order to facilitate improved functional strength.  Baseline: NT on eval  given proximity to surgery  02/20/23: 5/5 R knee flex/ext, 5/5 L knee flex, 4+/5 L knee ext  Goal status: PARTIALLY MET  6. Pt will report at least 50% decrease in overall pain levels in past week in order to facilitate improved tolerance to basic ADLs/mobility.   Baseline: 0-6/10  02/20/23: pt denies pain in last week  Goal status: MET  7. Pt will demonstrate appropriate performance of final prescribed HEP in order to facilitate improved self-management of symptoms post-discharge.   Baseline: initial HEP prescribed  02/20/23: pt reports good HEP adherence, performs appropriately in clinic  Goal status: MET   PLAN: DISCHARGE 02/20/23  PT FREQUENCY: NA  PT DURATION:NA  PLANNED INTERVENTIONS: NA  PLAN FOR NEXT SESSION: discharge to independent HEP, follow up with provider   Ashley Murrain PT, DPT 02/20/2023 1:12 PM

## 2023-02-23 ENCOUNTER — Ambulatory Visit: Payer: Medicare HMO | Admitting: Physical Therapy

## 2023-03-02 ENCOUNTER — Telehealth: Payer: Self-pay | Admitting: *Deleted

## 2023-03-02 ENCOUNTER — Ambulatory Visit (INDEPENDENT_AMBULATORY_CARE_PROVIDER_SITE_OTHER): Payer: Medicare HMO | Admitting: Physician Assistant

## 2023-03-02 ENCOUNTER — Other Ambulatory Visit (INDEPENDENT_AMBULATORY_CARE_PROVIDER_SITE_OTHER): Payer: Medicare HMO

## 2023-03-02 DIAGNOSIS — Z96652 Presence of left artificial knee joint: Secondary | ICD-10-CM

## 2023-03-02 NOTE — Telephone Encounter (Signed)
Ortho bundle 90 day in office meeting with patient completed with Georgia Duff. Survey.

## 2023-03-02 NOTE — Progress Notes (Signed)
Post-Op Visit Note   Patient: Robert Rhodes           Date of Birth: 1945/11/02           MRN: [id number] Visit Date: 03/02/2023 PCP: Hoy Register, MD   Assessment & Plan:  Chief Complaint:  Chief Complaint  Patient presents with   Left Knee - Follow-up   Visit Diagnoses:  1. Status post total left knee replacement     Plan: Patient is a pleasant 77 year old gentleman who comes in 11 weeks status post left total knee replacement 12/16/2022.  He has been doing great.  No complaints.  He has finished physical therapy.  Examination of the left knee reveals a fully healed surgical scar without complication.  Range of motion 0 to 110 degrees.  Stable to valgus varus stress.  He is neurovascularly intact distally.  At this point, he will continue to advance with activity as tolerated.  Dental prophylaxis reinforced.  Follow-up in 3 months for repeat evaluation.  Call with concerns or questions.  Follow-Up Instructions: Return in about 3 months (around 06/01/2023).   Orders:  Orders Placed This Encounter  Procedures   XR Knee 1-2 Views Left   No orders of the defined types were placed in this encounter.   Imaging: XR Knee 1-2 Views Left  Result Date: 03/02/2023 Well-seated prosthesis without complication   PMFS History: Patient Active Problem List   Diagnosis Date Noted   Status post total left knee replacement 12/16/2022   Primary osteoarthritis of left knee 10/26/2022   Schizoaffective disorder, bipolar type (HCC) 09/20/2022   Primary osteoarthritis of right knee 11/11/2021   Status post total right knee replacement 11/11/2021   AKI (acute kidney injury) (HCC) 04/21/2019   Pneumonia due to COVID-19 virus 04/21/2019   Osteoporosis    Ulcerative colitis, with rectal bleeding 08/06/2015   Gastroesophageal reflux disease without esophagitis 08/06/2015   Visual impairment of right eye 06/18/2015   Essential hypertension, benign 06/18/2015   Hernia of abdominal cavity  11/27/2014   Bipolar 1 disorder, mixed, moderate (HCC) 11/27/2014   Cellulitis 08/23/2014   Medically noncompliant 08/23/2014   Left leg cellulitis 08/23/2014   Cellulitis of left leg    Hypokalemia    Diarrhea 05/06/2014   Ulcerative colitis (HCC) 04/17/2014   Verrucous keratosis 04/17/2014   Nonspecific (abnormal) findings on radiological and other examination of gastrointestinal tract 07/18/2013   Acute esophagitis 07/18/2013   Hypovitaminosis D 05/06/2013   Ulcerative colitis, unspecified 04/04/2013   Knee pain, bilateral 03/05/2013   Past Medical History:  Diagnosis Date   Adenomatous colon polyp    Arthritis    bilateral knees-needs replacements   Claustrophobia    Colon cancer (HCC) 2015   COVID-19    Dysrhythmia    Eye abnormalities    right eye seen in ED 02/17/2014 wearing eye patch using eye drops   GERD (gastroesophageal reflux disease)    with certain foods   Glaucoma    not on eye drops at this time (10/21/2020)   Hypertension    on meds   Irregular heartbeat    per pt/doesn't know what it does   Osteoarthritis    bilat knees  needs replacement   Osteoporosis    pt denies   Substance abuse (HCC)    stopped over 3 years ago   UC (ulcerative colitis) (HCC)    Vitamin D deficiency     Family History  Problem Relation Age of Onset   Cancer  Mother        type unknown-in her leg   Diabetes Father    Heart disease Father    Heart disease Sister    Heart attack Brother    Healthy Sister    Colon cancer Neg Hx    Esophageal cancer Neg Hx    Rectal cancer Neg Hx    Stomach cancer Neg Hx    Colon polyps Neg Hx     Past Surgical History:  Procedure Laterality Date   APPLICATION OF WOUND VAC  12/16/2022   Procedure: APPLICATION OF WOUND VAC;  Surgeon: Tarry Kos, MD;  [facility];  Service: Orthopedics;;   COLON RESECTION N/A 08/23/2013   Procedure: Laparoscopic total abdominal colectomy and hernia repair;  Surgeon: Romie Levee, MD;  [facility];  Service: General;  Laterality: N/A;   COLON SURGERY  08/2013   COLONOSCOPY  11/02/2020   EUS N/A 07/18/2013   Procedure: UPPER ENDOSCOPIC ULTRASOUND (EUS) RADIAL;  Surgeon: Rachael Fee, MD;  [facility];  Service: Endoscopy;  Laterality: N/A;   EYE SURGERY     FLEXIBLE SIGMOIDOSCOPY N/A 2020   prep good   HERNIA REPAIR  2005   In [place]   INCISIONAL HERNIA REPAIR N/A 02/20/2014   Procedure: LAP ASSISTED INCISIONAL HERNIA REPAIR LYSIS OF ADHESIONS;  Surgeon: Romie Levee, MD;  Location: [facility];  Service: General;  Laterality: N/A;  converted to open @ 0935   INCISIONAL HERNIA REPAIR N/A 02/20/2014   Procedure: HERNIA REPAIR INCISIONAL;  Surgeon: Romie Levee, MD;  [facility]: [facility];  Service: General;  Laterality: N/A;  With MESH   INTRAOCULAR LENS INSERTION Bilateral    6 yrs ago   TOTAL KNEE ARTHROPLASTY Right 11/11/2021   Procedure: RIGHT TOTAL KNEE ARTHROPLASTY;  Surgeon: Tarry Kos, MD;  [facility];  Service: Orthopedics;  Laterality: Right;   TOTAL KNEE ARTHROPLASTY Left 12/16/2022   Procedure: LEFT TOTAL KNEE ARTHROPLASTY;  Surgeon: Tarry Kos, MD;  Location: [facility];  Service: Orthopedics;  Laterality: Left;   VENTRAL HERNIA REPAIR     Social History   Occupational History   Occupation: retired  Tobacco Use   Smoking status: Former    Current packs/day: 0.00    Types: Cigarettes    Quit date: 06/14/2003    Years since quitting: 19.7   Smokeless tobacco: Never   Tobacco comments:    quit in July 2005  Vaping Use   Vaping status: Never Used  Substance and Sexual Activity   Alcohol use: No   Drug use: Not Currently    Types: 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

## 2023-03-15 ENCOUNTER — Other Ambulatory Visit (HOSPITAL_COMMUNITY): Payer: Self-pay

## 2023-03-15 ENCOUNTER — Telehealth: Payer: Self-pay | Admitting: Pharmacy Technician

## 2023-03-15 NOTE — Telephone Encounter (Signed)
Pharmacy Patient Advocate Encounter   Received notification from CoverMyMeds that prior authorization for MESALAMINE 1000MG  SUPP is required/requested.   Insurance verification completed.   The patient is insured through [employer] .   Per test claim: PA required; PA submitted to [facility] via CoverMyMeds Key/confirmation #/EOC [id number] Status is pending

## 2023-03-22 NOTE — Telephone Encounter (Signed)
Please advise 

## 2023-03-22 NOTE — Telephone Encounter (Signed)
Pharmacy Patient Advocate Encounter  Received notification from [facility] that Prior Authorization for MESALAMINE 1000MG  SUPP has been DENIED.  Full denial letter will be uploaded to the media tab. See denial reason below.   PA #/Case ID/Reference #: [id number]

## 2023-03-24 ENCOUNTER — Other Ambulatory Visit: Payer: Self-pay

## 2023-03-27 ENCOUNTER — Other Ambulatory Visit: Payer: Self-pay

## 2023-04-05 ENCOUNTER — Encounter: Payer: Self-pay | Admitting: Family Medicine

## 2023-04-05 ENCOUNTER — Encounter: Payer: Medicare HMO | Admitting: Internal Medicine

## 2023-04-05 ENCOUNTER — Ambulatory Visit: Payer: Medicare HMO | Attending: Family Medicine | Admitting: Family Medicine

## 2023-04-05 VITALS — BP 129/75 | HR 84 | Ht 69.0 in | Wt 204.2 lb

## 2023-04-05 DIAGNOSIS — M170 Bilateral primary osteoarthritis of knee: Secondary | ICD-10-CM

## 2023-04-05 DIAGNOSIS — Z9189 Other specified personal risk factors, not elsewhere classified: Secondary | ICD-10-CM | POA: Diagnosis not present

## 2023-04-05 DIAGNOSIS — I10 Essential (primary) hypertension: Secondary | ICD-10-CM | POA: Diagnosis not present

## 2023-04-05 DIAGNOSIS — R1319 Other dysphagia: Secondary | ICD-10-CM | POA: Diagnosis not present

## 2023-04-05 MED ORDER — AMLODIPINE BESYLATE 5 MG PO TABS
ORAL_TABLET | ORAL | 1 refills | Status: DC
Start: 2023-04-05 — End: 2023-10-04

## 2023-04-05 MED ORDER — ATORVASTATIN CALCIUM 20 MG PO TABS
20.0000 mg | ORAL_TABLET | Freq: Every day | ORAL | 1 refills | Status: DC
Start: 2023-04-05 — End: 2023-10-04

## 2023-04-05 NOTE — Progress Notes (Signed)
Subjective:  Patient ID: Robert Rhodes, male    DOB: 1945/06/28  Age: 77 y.o. MRN: [id number]  CC: Medical Management of Chronic Issues   HPI Robert Rhodes is a 77 y.o. year old male with a history of hypertension, schizophrenia (diagnosed in the 60s, stable off medications), ulcerative colitis, osteoarthritis of the knees (status post bilateral  TKA).   Interval History: Discussed the use of AI scribe software for clinical note transcription with the patient, who gave verbal consent to proceed.   He presents for a routine follow-up. He reports no new concerns today. His blood pressure is well controlled on amlodipine, and he is mindful of his salt intake. He also takes potassium, magnesium, and atorvastatin for cholesterol management. He is no longer taking the muscle relaxant prescribed after his knee replacement.  His ulcerative colitis is managed with mesalamine, but he is currently awaiting insurance approval for a non-generic version of the medication, which he reports works better for him. He has an upcoming appointment with his gastroenterologist for an endoscopy due to difficulty swallowing.  He is currently taking pantoprazole for this issue.  He reports that his knee is doing well after the replacement, and he is able to exercise regularly at a local gym.    Past Medical History:  Diagnosis Date   Adenomatous colon polyp    Arthritis    bilateral knees-needs replacements   Claustrophobia    Colon cancer (HCC) 2015   COVID-19    Dysrhythmia    Eye abnormalities    right eye seen in ED 02/17/2014 wearing eye patch using eye drops   GERD (gastroesophageal reflux disease)    with certain foods   Glaucoma    not on eye drops at this time (10/21/2020)   Hypertension    on meds   Irregular heartbeat    per pt/doesn't know what it does   Osteoarthritis    bilat knees  needs replacement   Osteoporosis    pt denies   Substance abuse (HCC)    stopped over 3 years ago    UC (ulcerative colitis) (HCC)    Vitamin D deficiency     Past Surgical History:  Procedure Laterality Date   APPLICATION OF WOUND VAC  12/16/2022   Procedure: APPLICATION OF WOUND VAC;  Surgeon: Tarry Kos, MD;  [facility];  Service: Orthopedics;;   COLON RESECTION N/A 08/23/2013   Procedure: Laparoscopic total abdominal colectomy and hernia repair;  Surgeon: Romie Levee, MD;  Location: [facility];  Service: General;  Laterality: N/A;   COLON SURGERY  08/2013   COLONOSCOPY  11/02/2020   EUS N/A 07/18/2013   Procedure: UPPER ENDOSCOPIC ULTRASOUND (EUS) RADIAL;  Surgeon: Rachael Fee, MD;  Location: [facility];  Service: Endoscopy;  Laterality: N/A;   EYE SURGERY     FLEXIBLE SIGMOIDOSCOPY N/A 2020   prep good   HERNIA REPA[place]  2005   [place]   INCISIONAL HERNIA REPAIR N/A 02/20/2014   Procedure: LAP ASSISTED INCISIONAL HERNIA REPAIR LYSIS OF ADHESIONS;  Surgeon: Romie Levee, MD;  Location: [facility];  Service: General;  Laterality: N/A;  converted to open @ 0935   INCISIONAL HERNIA REPAIR N/A 02/20/2014   Procedure: HERNIA REPAIR INCISIONAL;  Surgeon: Romie Levee, MD;  Location: [facility];  Service: General;  Laterality: N/A;  With MESH   INTRAOCULAR LENS INSERTION Bilateral    6 yrs ago   TOTAL KNEE ARTHROPLASTY Right 11/11/2021  Procedure: RIGHT TOTAL KNEE ARTHROPLASTY;  Surgeon: Tarry Kos, MD;  [facility];  Service: Orthopedics;  Laterality: Right;   TOTAL KNEE ARTHROPLASTY Left 12/16/2022   Procedure: LEFT TOTAL KNEE ARTHROPLASTY;  Surgeon: Tarry Kos, MD;  [facility];  Service: Orthopedics;  Laterality: Left;   VENTRAL HERNIA REPAIR      Family History  Problem Relation Age of Onset   Cancer Mother        type unknown-in her leg   Diabetes Father    Heart disease Father    Heart disease Sister    Heart attack Brother    Healthy Sister    Colon cancer Neg Hx    Esophageal cancer Neg Hx    Rectal cancer Neg Hx    Stomach cancer Neg Hx     Colon polyps Neg Hx     Social History   Socioeconomic History   Marital status: Single    Spouse name: Not on file   Number of children: 0   Years of education: Not on file   Highest education level: GED or equivalent  Occupational History   Occupation: retired  Tobacco Use   Smoking status: Former    Current packs/day: 0.00    Types: Cigarettes    Quit date: 06/14/2003    Years since quitting: 19.8   Smokeless tobacco: Never   Tobacco comments:    quit in July 2005  Vaping Use   Vaping status: Never Used  Substance and Sexual Activity   Alcohol use: No   Drug use: Not Currently    Types: 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  Other Topics Concern   Not on file  Social History Narrative   Not on file   Social Determinants of Health   Financial Resource Strain: Medium Risk (04/03/2023)   Overall Financial Resource Strain (CARDIA)    Difficulty of Paying Living Expenses: Somewhat hard  Food Insecurity: Food Insecurity Present (04/03/2023)   Hunger Vital Sign    Worried About Running Out of Food in the Last Year: Sometimes true    Ran Out of Food in the Last Year: Sometimes true  Transportation Needs: No Transportation Needs (04/03/2023)   PRAPARE - Administrator, Civil Service (Medical): No    Lack of Transportation (Non-Medical): No  Physical Activity: Sufficiently Active (04/03/2023)   Exercise Vital Sign    Days of Exercise per Week: 4 days    Minutes of Exercise per Session: 60 min  Stress: No Stress Concern Present (04/03/2023)   [employer] of Occupational Health - Occupational Stress Questionnaire    Feeling of Stress : Not at all  Social Connections: Moderately Isolated (04/03/2023)   Social Connection and Isolation Panel [NHANES]    Frequency of Communication with Friends and Family: Twice a week    Frequency of Social Gatherings with Friends and Family: Twice a week    Attends Religious Services:  Never    Database administrator or Organizations: No    Attends Engineer, structural: More than 4 times per year    Marital Status: Never married    Allergies  Allergen Reactions   Ace Inhibitors Other (See Comments)    Unknown reaction- "affecting my breathing in some kind of way"   Advil [Ibuprofen] Other (See Comments)    Muscle tightness   Aspirin Nausea And Vomiting   Tylenol [Acetaminophen] Other (See Comments)  Muscle tightness    Outpatient Medications Prior to Visit  Medication Sig Dispense Refill   docusate sodium (COLACE) 100 MG capsule Take 1 capsule (100 mg total) by mouth daily as needed. 30 capsule 2   Homeopathic Products (ARNICA EX) Apply 1 Application topically daily as needed (bruising).     MAGNESIUM PO Take 1 tablet by mouth daily.     pantoprazole (PROTONIX) 40 MG tablet Take 1 tablet (40 mg total) by mouth 2 (two) times daily. Protonix 40 mg twice daily for 10 weeks. 90 tablet 1   POTASSIUM PO Take 1 tablet by mouth daily.     amLODipine (NORVASC) 5 MG tablet TAKE 1 TABLET BY MOUTH ONCE DAILY- HYPERTENSION 90 tablet 1   atorvastatin (LIPITOR) 20 MG tablet Take 1 tablet (20 mg total) by mouth daily. 90 tablet 1   propranolol (INDERAL) 10 MG tablet Take 10 mg by mouth daily as needed (claustrophobia).     mesalamine (CANASA) 1000 MG suppository Place 1 suppository (1,000 mg total) rectally at bedtime. (Patient not taking: Reported on 04/05/2023) 30 suppository 12   VITAMIN D PO Take 1 tablet by mouth 2 (two) times a week. (Patient not taking: Reported on 04/05/2023)     methocarbamol (ROBAXIN-750) 750 MG tablet Take 1 tablet (750 mg total) by mouth 2 (two) times daily as needed for muscle spasms. (Patient not taking: Reported on 04/05/2023) 20 tablet 2   No facility-administered medications prior to visit.     ROS Review of Systems  Constitutional:  Negative for activity change and appetite change.  HENT:  Negative for sinus pressure and sore  throat.   Respiratory:  Negative for chest tightness, shortness of breath and wheezing.   Cardiovascular:  Negative for chest pain and palpitations.  Gastrointestinal:  Negative for abdominal distention, abdominal pain and constipation.  Genitourinary: Negative.   Musculoskeletal: Negative.   Psychiatric/Behavioral:  Negative for behavioral problems and dysphoric mood.     Objective:  BP 129/75   Pulse 84   Ht 5\' 9"  (1.753 m)   Wt 204 lb 3.2 oz (92.6 kg)   SpO2 99%   BMI 30.16 kg/m      04/05/2023   10:32 AM 02/08/2023    3:24 PM 02/08/2023    3:14 PM  BP/Weight  Systolic BP 129 127 106  Diastolic BP 75 82 60  Wt. (Lbs) 204.2    BMI 30.16 kg/m2        Physical Exam Constitutional:      Appearance: He is well-developed.  Cardiovascular:     Rate and Rhythm: Normal rate.     Heart sounds: Normal heart sounds. No murmur heard. Pulmonary:     Effort: Pulmonary effort is normal.     Breath sounds: Normal breath sounds. No wheezing or rales.  Chest:     Chest wall: No tenderness.  Abdominal:     General: Bowel sounds are normal. There is no distension.     Palpations: Abdomen is soft. There is no mass.     Tenderness: There is no abdominal tenderness.  Musculoskeletal:        General: Normal range of motion.     Right lower leg: No edema.     Left lower leg: No edema.  Neurological:     Mental Status: He is alert and oriented to person, place, and time.  Psychiatric:        Mood and Affect: Mood normal.        Latest Ref  Rng & Units 12/07/2022   12:00 PM 09/20/2022    1:51 PM 11/30/2021   11:12 AM  CMP  Glucose 70 - 99 mg/dL 82  83  81   BUN 8 - 23 mg/dL 5  6  11    Creatinine 0.61 - 1.24 mg/dL 4.78  2.95  6.21   Sodium 135 - 145 mmol/L 137  136  132   Potassium 3.5 - 5.1 mmol/L 3.7  4.5  3.8   Chloride 98 - 111 mmol/L 101  99  94   CO2 22 - 32 mmol/L 28  25  23    Calcium 8.9 - 10.3 mg/dL 9.0  9.5  9.7   Total Protein 6.0 - 8.5 g/dL  7.8    Total Bilirubin  0.0 - 1.2 mg/dL  0.3    Alkaline Phos 44 - 121 IU/L  88    AST 0 - 40 IU/L  30    ALT 0 - 44 IU/L  35      Lipid Panel     Component Value Date/Time   CHOL 128 10/11/2021 1041   TRIG 81 10/11/2021 1041   HDL 44 10/11/2021 1041   CHOLHDL 2.9 10/11/2021 1041   CHOLHDL 4.2 03/05/2013 1021   VLDL 28 03/05/2013 1021   LDLCALC 68 10/11/2021 1041    CBC    Component Value Date/Time   WBC 9.3 12/17/2022 0127   RBC 4.55 12/17/2022 0127   HGB 12.2 (L) 12/17/2022 0127   HGB 13.2 09/20/2022 1351   HCT 37.9 (L) 12/17/2022 0127   HCT 41.1 09/20/2022 1351   PLT 428 (H) 12/17/2022 0127   PLT 443 09/20/2022 1351   MCV 83.3 12/17/2022 0127   MCV 83 09/20/2022 1351   MCH 26.8 12/17/2022 0127   MCHC 32.2 12/17/2022 0127   RDW 15.5 12/17/2022 0127   RDW 15.5 (H) 09/20/2022 1351   LYMPHSABS 2.7 09/20/2022 1351   MONOABS 1.4 (H) 11/17/2021 1115   EOSABS 0.2 09/20/2022 1351   BASOSABS 0.1 09/20/2022 1351    Lab Results  Component Value Date   HGBA1C 5.7 (H) 11/28/2022    Assessment & Plan:      Hypertension Well controlled on Amlodipine. No reported issues with medication. Patient reports low salt intake. -Continue Amlodipine. -Counseled on blood pressure goal of less than 130/80, low-sodium, DASH diet, medication compliance, 150 minutes of moderate intensity exercise per week. Discussed medication compliance, adverse effects.   Hyperlipidemia Patient is on Atorvastatin. No reported issues with medication. -Continue Atorvastatin. -Low-cholesterol diet -Order fasting lipid panel for tomorrow.  Ulcerative Colitis Patient reports issues with insurance approval for Mesalamine. Upcoming GI appointment for endoscopy. -Continue follow-up with GI specialist.  Dysphagia Patient reports difficulty swallowing, possibly due to acid reflux. Upcoming endoscopy scheduled. Currently on Pantoprazole. -Continue Pantoprazole. -Continue follow-up with GI specialist for endoscopy and  potential balloon dilation.  Status post knee Replacement Patient reports good recovery and has resumed regular exercise. -No changes to current management.  General Health Maintenance -Return in 6 months for routine follow-up.          Meds ordered this encounter  Medications   amLODipine (NORVASC) 5 MG tablet    Sig: TAKE 1 TABLET BY MOUTH ONCE DAILY- HYPERTENSION    Dispense:  90 tablet    Refill:  1   atorvastatin (LIPITOR) 20 MG tablet    Sig: Take 1 tablet (20 mg total) by mouth daily.    Dispense:  90 tablet    Refill:  1    Follow-up: Return in about 6 months (around 10/04/2023) for Chronic medical conditions.       Hoy Register, MD, FAAFP. [facility] [place], [place] [phone]   04/05/2023, 10:54 AM

## 2023-04-05 NOTE — Patient Instructions (Signed)
VISIT SUMMARY:  Mr. Coello, you had a routine follow-up appointment today. You reported no new concerns, and we reviewed your current medications and health conditions. Your blood pressure is well controlled, and you are managing your cholesterol and ulcerative colitis with your current medications. You are recovering well from your knee replacement and have resumed regular exercise. We also discussed your upcoming endoscopy for difficulty swallowing.  YOUR PLAN:  -HYPERTENSION: Hypertension means high blood pressure. Your blood pressure is well controlled with Amlodipine, and you are maintaining a low salt intake. Please continue taking Amlodipine as prescribed.  -HYPERLIPIDEMIA: Hyperlipidemia means high cholesterol levels. You are currently taking Atorvastatin to manage this condition. We have ordered a fasting lipid panel for tomorrow to check your cholesterol levels. Please continue taking Atorvastatin as prescribed.  -ULCERATIVE COLITIS: Ulcerative colitis is a chronic condition that causes inflammation in the colon. You are currently taking Mesalamine, but are awaiting insurance approval for a non-generic version. Please continue to follow up with your GI specialist.  -DYSPHAGIA: Dysphagia means difficulty swallowing, which may be due to acid reflux. You are currently taking Pantoprazole and have an upcoming endoscopy scheduled. Please continue taking Pantoprazole and follow up with your GI specialist for the endoscopy and potential treatment options.  -KNEE REPLACEMENT: You are recovering well from your knee replacement surgery and have resumed regular exercise. No changes to your current management are needed.  -GENERAL HEALTH MAINTENANCE: Please return in 6 months for your routine follow-up appointment.  INSTRUCTIONS:  Please have your fasting lipid panel done tomorrow. Continue taking all your medications as prescribed. Follow up with your GI specialist for your endoscopy and any  further treatment. Return to our office in 6 months for your routine follow-up appointment.

## 2023-04-06 ENCOUNTER — Ambulatory Visit: Payer: Medicare HMO | Attending: Family Medicine

## 2023-04-06 DIAGNOSIS — I10 Essential (primary) hypertension: Secondary | ICD-10-CM | POA: Diagnosis not present

## 2023-04-06 DIAGNOSIS — Z9189 Other specified personal risk factors, not elsewhere classified: Secondary | ICD-10-CM | POA: Diagnosis not present

## 2023-04-07 LAB — CMP14+EGFR
ALT: 15 [IU]/L (ref 0–44)
AST: 20 [IU]/L (ref 0–40)
Albumin: 4 g/dL (ref 3.8–4.8)
Alkaline Phosphatase: 105 [IU]/L (ref 44–121)
BUN/Creatinine Ratio: 7 — ABNORMAL LOW (ref 10–24)
BUN: 7 mg/dL — ABNORMAL LOW (ref 8–27)
Bilirubin Total: 0.4 mg/dL (ref 0.0–1.2)
CO2: 21 mmol/L (ref 20–29)
Calcium: 10 mg/dL (ref 8.6–10.2)
Chloride: 105 mmol/L (ref 96–106)
Creatinine, Ser: 0.99 mg/dL (ref 0.76–1.27)
Globulin, Total: 3.8 g/dL (ref 1.5–4.5)
Glucose: 88 mg/dL (ref 70–99)
Potassium: 4.4 mmol/L (ref 3.5–5.2)
Sodium: 139 mmol/L (ref 134–144)
Total Protein: 7.8 g/dL (ref 6.0–8.5)
eGFR: 77 mL/min/{1.73_m2} (ref >59)

## 2023-04-07 LAB — LP+NON-HDL CHOLESTEROL
Cholesterol, Total: 183 mg/dL (ref 100–199)
HDL: 49 mg/dL (ref >39)
LDL Chol Calc (NIH): 114 mg/dL — ABNORMAL HIGH (ref 0–99)
Total Non-HDL-Chol (LDL+VLDL): 134 mg/dL — ABNORMAL HIGH (ref 0–129)
Triglycerides: 112 mg/dL (ref 0–149)
VLDL Cholesterol Cal: 20 mg/dL (ref 5–40)

## 2023-04-10 ENCOUNTER — Other Ambulatory Visit (HOSPITAL_COMMUNITY): Payer: Self-pay

## 2023-04-10 NOTE — Telephone Encounter (Signed)
Pharmacy Patient Advocate Encounter  Received notification from [facility] that the APPEAL for MESALAMINE 100MG  SUPP has been APPROVED from 1.1.24 to 12.31.25. Ran test claim, Copay is $0. This test claim was processed through [facility]- copay amounts may vary at other pharmacies due to pharmacy/plan contracts, or as the patient moves through the different stages of their insurance plan.   PA #/Case ID/Reference #:

## 2023-04-11 ENCOUNTER — Telehealth: Payer: Self-pay | Admitting: Internal Medicine

## 2023-04-11 ENCOUNTER — Ambulatory Visit: Payer: Medicare HMO | Admitting: Internal Medicine

## 2023-04-11 ENCOUNTER — Encounter: Payer: Self-pay | Admitting: Internal Medicine

## 2023-04-11 ENCOUNTER — Other Ambulatory Visit: Payer: Self-pay

## 2023-04-11 VITALS — BP 125/83 | HR 72 | Temp 99.1°F | Resp 11 | Ht 69.0 in | Wt 204.0 lb

## 2023-04-11 DIAGNOSIS — I499 Cardiac arrhythmia, unspecified: Secondary | ICD-10-CM | POA: Diagnosis not present

## 2023-04-11 DIAGNOSIS — K222 Esophageal obstruction: Secondary | ICD-10-CM | POA: Diagnosis not present

## 2023-04-11 DIAGNOSIS — K209 Esophagitis, unspecified without bleeding: Secondary | ICD-10-CM

## 2023-04-11 DIAGNOSIS — I10 Essential (primary) hypertension: Secondary | ICD-10-CM | POA: Diagnosis not present

## 2023-04-11 MED ORDER — MESALAMINE 1000 MG RE SUPP: 1000.0000 mg | Freq: Every day | RECTAL | 12 refills | Status: AC

## 2023-04-11 MED ORDER — PANTOPRAZOLE SODIUM 40 MG PO TBEC: 40.0000 mg | DELAYED_RELEASE_TABLET | Freq: Every day | ORAL | 1 refills | Status: DC

## 2023-04-11 MED ORDER — SODIUM CHLORIDE 0.9 % IV SOLN: 500.0000 mL | Freq: Once | INTRAVENOUS | Status: DC

## 2023-04-11 NOTE — Progress Notes (Signed)
Report to PACU, RN, vss, BBS= Clear.  

## 2023-04-11 NOTE — Progress Notes (Signed)
GASTROENTEROLOGY PROCEDURE H&P NOTE   Primary Care Physician: Hoy Register, MD    Reason for Procedure:   History of esophagitis  Plan:    EGD  Patient is appropriate for endoscopic procedure(s) in the ambulatory ([facility]) setting.  The nature of the procedure, as well as the risks, benefits, and alternatives were carefully and thoroughly reviewed with the patient. Ample time for discussion and questions allowed. The patient understood, was satisfied, and agreed to proceed.     HPI: Robert Rhodes is a 77 y.o. male who presents for EGD for history of esophagitis. His heartburn has improved on PPI BID therapy. He thinks his dysphagia has improved as well.  Past Medical History:  Diagnosis Date   Adenomatous colon polyp    Arthritis    bilateral knees-needs replacements   Claustrophobia    Colon cancer (HCC) 2015   COVID-19    Dysrhythmia    Eye abnormalities    right eye seen in ED 02/17/2014 wearing eye patch using eye drops   GERD (gastroesophageal reflux disease)    with certain foods   Glaucoma    not on eye drops at this time (10/21/2020)   Hypertension    on meds   Irregular heartbeat    per pt/doesn't know what it does   Osteoarthritis    bilat knees  needs replacement   Osteoporosis    pt denies   Substance abuse (HCC)    stopped over 3 years ago   UC (ulcerative colitis) (HCC)    Vitamin D deficiency     Past Surgical History:  Procedure Laterality Date   APPLICATION OF WOUND VAC  12/16/2022   Procedure: APPLICATION OF WOUND VAC;  Surgeon: Tarry Kos, MD;  [facility]: [facility];  Service: Orthopedics;;   COLON RESECTION N/A 08/23/2013   Procedure: Laparoscopic total abdominal colectomy and hernia repair;  Surgeon: Romie Levee, MD;  Location: [facility];  Service: General;  Laterality: N/A;   COLON SURGERY  08/2013   COLONOSCOPY  11/02/2020   EUS N/A 07/18/2013   Procedure: UPPER ENDOSCOPIC ULTRASOUND (EUS) RADIAL;  Surgeon: Rachael Fee, MD;  Location:  [facility];  Service: Endoscopy;  Laterality: N/A;   EYE SURGERY     FLEXIBLE SIGMOIDOSCOPY N/A 2020   prep good   HERNIA REPAIR  2005   [place]   INCISIONAL HERNIA REPAIR N/A 02/20/2014   Procedure: LAP ASSISTED INCISIONAL HERNIA REPAIR LYSIS OF ADHESIONS;  Surgeon: Romie Levee, MD;  Location: [facility];  Service: General;  Laterality: N/A;  converted to open @ 0935   INCISIONAL HERNIA REPAIR N/A 02/20/2014   Procedure: HERNIA REPAIR INCISIONAL;  Surgeon: Romie Levee, MD;  Location: [facility];  Service: General;  Laterality: N/A;  With MESH   INTRAOCULAR LENS INSERTION Bilateral    6 yrs ago   TOTAL KNEE ARTHROPLASTY Right 11/11/2021   Procedure: RIGHT TOTAL KNEE ARTHROPLASTY;  Surgeon: Tarry Kos, MD;  Location: [facility];  Service: Orthopedics;  Laterality: Right;   TOTAL KNEE ARTHROPLASTY Left 12/16/2022   Procedure: LEFT TOTAL KNEE ARTHROPLASTY;  Surgeon: Tarry Kos, MD;  Location: [facility];  Service: Orthopedics;  Laterality: Left;   VENTRAL HERNIA REPAIR      Prior to Admission medications   Medication Sig Start Date End Date Taking? Authorizing Provider  amLODipine (NORVASC) 5 MG tablet TAKE 1 TABLET BY MOUTH ONCE DAILY- HYPERTENSION 04/05/23   Hoy Register, MD  atorvastatin (LIPITOR) 20 MG tablet  Take 1 tablet (20 mg total) by mouth daily. 04/05/23   Hoy Register, MD  docusate sodium (COLACE) 100 MG capsule Take 1 capsule (100 mg total) by mouth daily as needed. 12/12/22 12/12/23  Cristie Hem, PA-C  Homeopathic Products (ARNICA EX) Apply 1 Application topically daily as needed (bruising).    [provider]  MAGNESIUM PO Take 1 tablet by mouth daily.    [provider]  mesalamine (CANASA) 1000 MG suppository Place 1 suppository (1,000 mg total) rectally at bedtime. Patient not taking: Reported on 04/05/2023 02/17/23   Imogene Burn, MD  pantoprazole (PROTONIX) 40 MG tablet Take 1 tablet (40 mg total) by mouth 2 (two) times daily. Protonix 40 mg twice  daily for 10 weeks. 02/08/23   Imogene Burn, MD  POTASSIUM PO Take 1 tablet by mouth daily.    [provider]  VITAMIN D PO Take 1 tablet by mouth 2 (two) times a week. Patient not taking: Reported on 04/05/2023    [provider]    Current Outpatient Medications  Medication Sig Dispense Refill   amLODipine (NORVASC) 5 MG tablet TAKE 1 TABLET BY MOUTH ONCE DAILY- HYPERTENSION 90 tablet 1   atorvastatin (LIPITOR) 20 MG tablet Take 1 tablet (20 mg total) by mouth daily. 90 tablet 1   docusate sodium (COLACE) 100 MG capsule Take 1 capsule (100 mg total) by mouth daily as needed. 30 capsule 2   Homeopathic Products (ARNICA EX) Apply 1 Application topically daily as needed (bruising).     MAGNESIUM PO Take 1 tablet by mouth daily.     mesalamine (CANASA) 1000 MG suppository Place 1 suppository (1,000 mg total) rectally at bedtime. (Patient not taking: Reported on 04/05/2023) 30 suppository 12   pantoprazole (PROTONIX) 40 MG tablet Take 1 tablet (40 mg total) by mouth 2 (two) times daily. Protonix 40 mg twice daily for 10 weeks. 90 tablet 1   POTASSIUM PO Take 1 tablet by mouth daily.     VITAMIN D PO Take 1 tablet by mouth 2 (two) times a week. (Patient not taking: Reported on 04/05/2023)     Current Facility-Administered Medications  Medication Dose Route Frequency Provider Last Rate Last Admin   0.9 %  sodium chloride infusion  500 mL Intravenous Once Imogene Burn, MD        Allergies as of 04/11/2023 - Review Complete 04/11/2023  Allergen Reaction Noted   Ace inhibitors Other (See Comments) 08/19/2013   Advil [ibuprofen] Other (See Comments) 03/05/2013   Aspirin Nausea And Vomiting 03/05/2013   Tylenol [acetaminophen] Other (See Comments) 03/05/2013    Family History  Problem Relation Age of Onset   Cancer Mother        type unknown-in her leg   Diabetes Father    Heart disease Father    Heart disease Sister    Heart attack Brother    Healthy Sister     Colon cancer Neg Hx    Esophageal cancer Neg Hx    Rectal cancer Neg Hx    Stomach cancer Neg Hx    Colon polyps Neg Hx     Social History   Socioeconomic History   Marital status: Single    Spouse name: Not on file   Number of children: 0   Years of education: Not on file   Highest education level: GED or equivalent  Occupational History   Occupation: retired  Tobacco Use   Smoking status: Former    Current  packs/day: 0.00    Types: Cigarettes    Quit date: 06/14/2003    Years since quitting: 19.8   Smokeless tobacco: Never   Tobacco comments:    quit in July 2005  Vaping Use   Vaping status: Never Used  Substance and Sexual Activity   Alcohol use: No   Drug use: Not Currently    Types: 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  Other Topics Concern   Not on file  Social History Narrative   Not on file   Social Determinants of Health   Financial Resource Strain: Medium Risk (04/03/2023)   Overall Financial Resource Strain (CARDIA)    Difficulty of Paying Living Expenses: Somewhat hard  Food Insecurity: Food Insecurity Present (04/03/2023)   Hunger Vital Sign    Worried About Running Out of Food in the Last Year: Sometimes true    Ran Out of Food in the Last Year: Sometimes true  Transportation Needs: No Transportation Needs (04/03/2023)   PRAPARE - Administrator, Civil Service (Medical): No    Lack of Transportation (Non-Medical): No  Physical Activity: Sufficiently Active (04/03/2023)   Exercise Vital Sign    Days of Exercise per Week: 4 days    Minutes of Exercise per Session: 60 min  Stress: No Stress Concern Present (04/03/2023)   [employer] of Occupational Health - Occupational Stress Questionnaire    Feeling of Stress : Not at all  Social Connections: Moderately Isolated (04/03/2023)   Social Connection and Isolation Panel [NHANES]    Frequency of Communication with Friends and Family: Twice a week     Frequency of Social Gatherings with Friends and Family: Twice a week    Attends Religious Services: Never    Database administrator or Organizations: No    Attends Engineer, structural: More than 4 times per year    Marital Status: Never married  Intimate Partner Violence: Not At Risk (10/26/2022)   Humiliation, Afraid, Rape, and Kick questionnaire    Fear of Current or Ex-Partner: No    Emotionally Abused: No    Physically Abused: No    Sexually Abused: No    Physical Exam: Vital signs in last 24 hours: BP (!) 140/58   Pulse 68   Temp 99.1 F (37.3 C) (Temporal)   Ht 5\' 9"  (1.753 m)   Wt 204 lb (92.5 kg)   SpO2 99%   BMI 30.13 kg/m  GEN: NAD EYE: Sclerae anicteric ENT: MMM CV: Non-tachycardic Pulm: No increased work of breathing GI: Soft, NT/ND NEURO:  Alert & Oriented   Eulah Pont, MD [facility]  04/11/2023 11:07 AM

## 2023-04-11 NOTE — Progress Notes (Signed)
Pt reports no changes in medical or surgical history since EGD/Flexible sigmoidoscopy done at [facility] on 02/08/23

## 2023-04-11 NOTE — Addendum Note (Signed)
Addended by: Rosine Door on: 04/11/2023 01:20 PM   Modules accepted: Orders

## 2023-04-11 NOTE — Progress Notes (Signed)
Called to room to assist during endoscopic procedure.  Patient ID and intended procedure confirmed with present staff. Received instructions for my participation in the procedure from the performing physician.  

## 2023-04-11 NOTE — Telephone Encounter (Signed)
Message left for the patient to advise of this.

## 2023-04-11 NOTE — Op Note (Signed)
[facility] Patient Name: Robert Rhodes Procedure Date: 04/11/2023 11:37 AM MRN: [id number] Endoscopist: Robert Rhodes [place] , , [id number] Age: 77 Referring MD:  Date of Birth: 1945/09/20 Gender: Male Account #: [id number] Procedure:                Upper GI endoscopy Indications:              Dysphagia, Heartburn, Follow-up of esophagitis Medicines:                Monitored Anesthesia Care Procedure:                Pre-Anesthesia Assessment:                           - Prior to the procedure, a History and Physical                            was performed, and patient medications and                            allergies were reviewed. The patient's tolerance of                            previous anesthesia was also reviewed. The risks                            and benefits of the procedure and the sedation                            options and risks were discussed with the patient.                            All questions were answered, and informed consent                            was obtained. Prior Anticoagulants: The patient has                            taken no anticoagulant or antiplatelet agents. ASA                            Grade Assessment: III - A patient with severe                            systemic disease. After reviewing the risks and                            benefits, the patient was deemed in satisfactory                            condition to undergo the procedure.                           After obtaining informed consent, the endoscope was  passed under direct vision. Throughout the                            procedure, the patient's blood pressure, pulse, and                            oxygen saturations were monitored continuously. The                            GIF [id number] #[id number] was introduced through the                            mouth, and advanced to the second part of duodenum.                             The upper GI endoscopy was accomplished without                            difficulty. The patient tolerated the procedure                            well. Scope In: Scope Out: Findings:                 One benign-appearing, intrinsic moderate                            (circumferential scarring or stenosis; an endoscope                            may pass) stenosis was found in the distal                            esophagus. This stenosis measured 1 cm (in length).                            The stenosis was traversed. A TTS dilator was                            passed through the scope. Dilation with an 18-19-20                            mm balloon dilator was performed to 20 mm. The                            dilation site was examined and showed mild mucosal                            disruption. Biopsies were taken with a cold forceps                            for histology.                           A large  hiatal hernia was present.                           The examined duodenum was normal. Complications:            No immediate complications. Estimated Blood Loss:     Estimated blood loss was minimal. Impression:               - Benign-appearing esophageal stenosis. Dilated.                            Biopsied.                           - Large hiatal hernia.                           - Normal examined duodenum. Recommendation:           - Discharge patient to home (with escort).                           - Your esophagitis has healed on the twice daily                            pantoprazole.                           - Await pathology results.                           - Continue to take pantoprazole 40 mg daily at this                            point.                           - Follow up with Dr. Rhea Rhodes or APP in 2-3 months.                           - The findings and recommendations were discussed                            with the patient. Dr Robert Rhodes  "Robert Rhodes" Robert Rhodes,  04/11/2023 12:02:36 PM

## 2023-04-11 NOTE — Telephone Encounter (Signed)
Inbound call from [facility] pharmacy regarding pantoprazole medication for patient that was prescribed today after procedure. States they are unclear on directions for medication. States they received instructions for patient to take medication one table once a day and directions stating one tablet twice a day for 10 day. Requesting a call back to be advised on correct instructions. Please advise, thank you.

## 2023-04-11 NOTE — Patient Instructions (Addendum)
Please abide by the diet rules for today.  You have a sheet to explain.  Read all of the discharge instructions. Decrease Pantoprazole to once a day   YOU HAD AN ENDOSCOPIC PROCEDURE TODAY AT THE [facility]:   Refer to the procedure report that was given to you for any specific questions about what was found during the examination.  If the procedure report does not answer your questions, please call your gastroenterologist to clarify.  If you requested that your care partner not be given the details of your procedure findings, then the procedure report has been included in a sealed envelope for you to review at your convenience later.  YOU SHOULD EXPECT: Some feelings of bloating in the abdomen. Passage of more gas than usual.  Walking can help get rid of the air that was put into your GI tract during the procedure and reduce the bloating. If you had a lower endoscopy (such as a colonoscopy or flexible sigmoidoscopy) you may notice spotting of blood in your stool or on the toilet paper. If you underwent a bowel prep for your procedure, you may not have a normal bowel movement for a few days.  Please Note:  You might notice some irritation and congestion in your nose or some drainage.  This is from the oxygen used during your procedure.  There is no need for concern and it should clear up in a day or so.  SYMPTOMS TO REPORT IMMEDIATELY:  Following upper endoscopy (EGD)  Vomiting of blood or coffee ground material  New chest pain or pain under the shoulder blades  Painful or persistently difficult swallowing  New shortness of breath  Fever of 100F or higher  Black, tarry-looking stools  For urgent or emergent issues, a gastroenterologist can be reached at any hour by calling [phone]. Do not use MyChart messaging for urgent concerns.    DIET:  We do recommend nothing by mouth until 1 pm.   From 1 pm until 2 clear liquids only.  A soft diet for the rest of today so you don't  choke  Drink plenty of fluids but you should avoid alcoholic beverages for 24 hours.  ACTIVITY:  You should plan to take it easy for the rest of today and you should NOT DRIVE or use heavy machinery until tomorrow (because of the sedation medicines used during the test).    FOLLOW UP: Our staff will call the number listed on your records the next business day following your procedure.  We will call around 7:15- 8:00 am to check on you and address any questions or concerns that you may have regarding the information given to you following your procedure. If we do not reach you, we will leave a message.     If any biopsies were taken you will be contacted by phone or by letter within the next 1-3 weeks.  Please call us at [phone] if you have not heard about the biopsies in 3 weeks.    SIGNATURES/CONFIDENTIALITY: You and/or your care partner have signed paperwork which will be entered into your electronic medical record.  These signatures attest to the fact that that the information above on your After Visit Summary has been reviewed and is understood.  Full responsibility of the confidentiality of this discharge information lies with you and/or your care-partner.

## 2023-04-11 NOTE — Telephone Encounter (Signed)
Called pharmacy and verified that the patient should be taking pantoprazole QD not BID for 10 weeks. No further questions.

## 2023-04-12 ENCOUNTER — Telehealth: Payer: Self-pay

## 2023-04-12 NOTE — Telephone Encounter (Signed)
  Follow up Call-     04/11/2023   11:02 AM 02/08/2023    2:21 PM 11/02/2020   10:00 AM  Call back number  Post procedure Call Back phone  # [phone]  Permission to leave phone message Yes Yes Yes     Patient questions:  Do you have a fever, pain , or abdominal swelling? No. Pain Score  0 *  Have you tolerated food without any problems? Yes.    Have you been able to return to your normal activities? Yes.    Do you have any questions about your discharge instructions: Diet   No. Medications  No. Follow up visit  No.  Do you have questions or concerns about your Care? No.  Actions: * If pain score is 4 or above: No action needed, pain <4.

## 2023-04-13 ENCOUNTER — Encounter: Payer: Self-pay | Admitting: Internal Medicine

## 2023-04-13 LAB — SURGICAL PATHOLOGY

## 2023-06-02 ENCOUNTER — Telehealth: Payer: Self-pay | Admitting: *Deleted

## 2023-06-02 NOTE — Telephone Encounter (Signed)
Ortho bundle in office visit completed. Late entry for 01/17/23

## 2023-06-15 ENCOUNTER — Ambulatory Visit: Payer: Medicare HMO | Admitting: Physician Assistant

## 2023-07-14 ENCOUNTER — Ambulatory Visit: Payer: Medicare HMO | Admitting: Physician Assistant

## 2023-08-17 ENCOUNTER — Encounter: Payer: Self-pay | Admitting: Physician Assistant

## 2023-08-17 ENCOUNTER — Ambulatory Visit: Payer: Medicare HMO | Admitting: Physician Assistant

## 2023-08-17 VITALS — BP 122/64 | HR 90 | Ht 69.0 in | Wt 212.4 lb

## 2023-08-17 DIAGNOSIS — K2100 Gastro-esophageal reflux disease with esophagitis, without bleeding: Secondary | ICD-10-CM

## 2023-08-17 DIAGNOSIS — K5190 Ulcerative colitis, unspecified, without complications: Secondary | ICD-10-CM

## 2023-08-17 DIAGNOSIS — Z8719 Personal history of other diseases of the digestive system: Secondary | ICD-10-CM

## 2023-08-17 NOTE — Progress Notes (Signed)
 Chief Complaint: Follow-up EGD  HPI:    Robert Rhodes is a 78 year old male, known to Dr. Rhea Belton, with a past medical history as listed below including ulcerative colitis, osteoarthritis, polysubstance abuse, colon cancer status post subtotal colectomy with ileorectal anastomosis in 2015, GERD with history of esophagitis, hiatal hernia, status post ventral hernia repair 2015 and multiple others, who presents to clinic today for follow-up after recent EGD.    08/03/2018 Flexima endoscopy with patent end-to-end ileorectal anastomosis characterized by healthy-appearing mucosa, moderately active ulcerative colitis which is worsened since last exam.  Biopsy showed moderate to severely active chronic colitis in the rectum without evidence of dysplasia or malignancy.  Repeat colonoscopy recommended in 2 years.    09/06/2019 patient seen in clinic and was doing well on Canasa suppositories daily.  At that time refilled patient's Mesalamine suppositories at 1000 mg nightly.    01/17/2023 patient seen in clinic by Robert Rhodes at that time was discussing repeat flex sigmoidoscopy and had concerns about dysphagia.  He was scheduled for flex sig and EGD with dilation with Dr. [name] given the Dr. Rhea Belton did not have availability.    02/08/2023 EGD with LA grade C esophagitis, large hiatal hernia and erythematous mucosa in the antrum, told to use Pantoprazole 40 p.o. twice daily x 10 weeks and repeat upper endoscopy in 2 months to check for healing.    02/08/2023 flex sigmoidoscopy with patent end-to-end ileorectal anastomosis characterized by healthy-appearing mucosa, normal terminal ileum, inactive ulcerative colitis and internal hemorrhoids.    04/11/2023 EGD for dysphagia and heartburn with 1 benign-appearing esophageal stenosis dilated and a large hiatal hernia.  At that point was following up from esophagitis which had healed on twice daily PPI.  Continue on Pantoprazole 40 mg daily.  Biopsy showed esophageal  squamous mucosa with no significant diagnostic alteration.    Today, patient presents to clinic and tells me he doing very well.  He is still on Pantoprazole 40 mg daily and still believes it helps him with his reflux symptoms.  He would like to stay on it.  Also still taking Mesalamine suppositories nightly and has a normal solid stool every day.  No complaints.    Denies fever, chills or weight loss.  Past Medical History:  Diagnosis Date   Adenomatous colon polyp    Arthritis    bilateral knees-needs replacements   Claustrophobia    Colon cancer (HCC) 2015   COVID-19    Dysrhythmia    Eye abnormalities    right eye seen in ED 02/17/2014 wearing eye patch using eye drops   GERD (gastroesophageal reflux disease)    with certain foods   Glaucoma    not on eye drops at this time (10/21/2020)   Hypertension    on meds   Irregular heartbeat    per pt/doesn't know what it does   Osteoarthritis    bilat knees  needs replacement   Osteoporosis    pt denies   Substance abuse (HCC)    stopped over 3 years ago   UC (ulcerative colitis) (HCC)    Vitamin D deficiency     Past Surgical History:  Procedure Laterality Date   APPLICATION OF WOUND VAC  12/16/2022   Procedure: APPLICATION OF WOUND VAC;  Surgeon: Tarry Kos, MD;  Location: [facility] OR;  Service: Orthopedics;;   COLON RESECTION N/A 08/23/2013   Procedure: Laparoscopic total abdominal colectomy and hernia repair;  Surgeon: Romie Levee, MD;  Location: [facility];  Service:  General;  Laterality: N/A;   COLON SURGERY  08/2013   COLONOSCOPY  11/02/2020   EUS N/A 07/18/2013   Procedure: UPPER ENDOSCOPIC ULTRASOUND (EUS) RADIAL;  Surgeon: Rachael Fee, MD;  Location: [facility];  Service: Endoscopy;  Laterality: N/A;   EYE SURGERY     FLEXIBLE SIGMOIDOSCOPY N/A 2020   prep good   HERNIA REPAIR  2005   In [place]/put in a mesh   INCISIONAL HERNIA REPAIR N/A 02/20/2014   Procedure: LAP ASSISTED INCISIONAL HERNIA REPAIR LYSIS OF  ADHESIONS;  Surgeon: Romie Levee, MD;  Location: [facility];  Service: General;  Laterality: N/A;  converted to open @ 0935   INCISIONAL HERNIA REPAIR N/A 02/20/2014   Procedure: HERNIA REPAIR INCISIONAL;  Surgeon: Romie Levee, MD;  Location: [facility];  Service: General;  Laterality: N/A;  With MESH   INTRAOCULAR LENS INSERTION Bilateral    6 yrs ago   TOTAL KNEE ARTHROPLASTY Right 11/11/2021   Procedure: RIGHT TOTAL KNEE ARTHROPLASTY;  Surgeon: Tarry Kos, MD;  Location: [facility] OR;  Service: Orthopedics;  Laterality: Right;   TOTAL KNEE ARTHROPLASTY Left 12/16/2022   Procedure: LEFT TOTAL KNEE ARTHROPLASTY;  Surgeon: Tarry Kos, MD;  Location: [facility] OR;  Service: Orthopedics;  Laterality: Left;   UPPER GASTROINTESTINAL ENDOSCOPY     VENTRAL HERNIA REPAIR      Current Outpatient Medications  Medication Sig Dispense Refill   amLODipine (NORVASC) 5 MG tablet TAKE 1 TABLET BY MOUTH ONCE DAILY- HYPERTENSION 90 tablet 1   atorvastatin (LIPITOR) 20 MG tablet Take 1 tablet (20 mg total) by mouth daily. 90 tablet 1   docusate sodium (COLACE) 100 MG capsule Take 1 capsule (100 mg total) by mouth daily as needed. 30 capsule 2   Homeopathic Products (ARNICA EX) Apply 1 Application topically daily as needed (bruising).     MAGNESIUM PO Take 1 tablet by mouth daily.     mesalamine (CANASA) 1000 MG suppository Place 1 suppository (1,000 mg total) rectally at bedtime. 30 suppository 12   pantoprazole (PROTONIX) 40 MG tablet Take 1 tablet (40 mg total) by mouth daily. 90 tablet 1   POTASSIUM PO Take 1 tablet by mouth daily.     VITAMIN D PO Take 1 tablet by mouth 2 (two) times a week. (Patient not taking: Reported on 04/05/2023)     No current facility-administered medications for this visit.    Allergies as of 08/17/2023 - Review Complete 04/11/2023  Allergen Reaction Noted   Ace inhibitors Other (See Comments) 08/19/2013   Advil [ibuprofen] Other (See Comments) 03/05/2013   Aspirin Nausea And Vomiting  03/05/2013   Tylenol [acetaminophen] Other (See Comments) 03/05/2013    Family History  Problem Relation Age of Onset   Cancer Mother        type unknown-in her leg   Diabetes Father    Heart disease Father    Heart disease Sister    Heart attack Brother    Healthy Sister    Colon cancer Neg Hx    Esophageal cancer Neg Hx    Rectal cancer Neg Hx    Stomach cancer Neg Hx    Colon polyps Neg Hx     Social History   Socioeconomic History   Marital status: Single    Spouse name: Not on file   Number of children: 0   Years of education: Not on file   Highest education level: GED or equivalent  Occupational History   Occupation: retired  Tobacco Use  Smoking status: Former    Current packs/day: 0.00    Types: Cigarettes    Quit date: 06/14/2003    Years since quitting: 20.1   Smokeless tobacco: Never   Tobacco comments:    quit in July 2005  Vaping Use   Vaping status: Never Used  Substance and Sexual Activity   Alcohol use: No   Drug use: Not Currently    Types: 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  Other Topics Concern   Not on file  Social History Narrative   Not on file   Social Drivers of Health   Financial Resource Strain: Medium Risk (04/03/2023)   Overall Financial Resource Strain (CARDIA)    Difficulty of Paying Living Expenses: Somewhat hard  Food Insecurity: Food Insecurity Present (04/03/2023)   Hunger Vital Sign    Worried About Running Out of Food in the Last Year: Sometimes true    Ran Out of Food in the Last Year: Sometimes true  Transportation Needs: No Transportation Needs (04/03/2023)   PRAPARE - Administrator, Civil Service (Medical): No    Lack of Transportation (Non-Medical): No  Physical Activity: Sufficiently Active (04/03/2023)   Exercise Vital Sign    Days of Exercise per Week: 4 days    Minutes of Exercise per Session: 60 min  Stress: No Stress Concern Present (04/03/2023)    [employer] of Occupational Health - Occupational Stress Questionnaire    Feeling of Stress : Not at all  Social Connections: Moderately Isolated (04/03/2023)   Social Connection and Isolation Panel [NHANES]    Frequency of Communication with Friends and Family: Twice a week    Frequency of Social Gatherings with Friends and Family: Twice a week    Attends Religious Services: Never    Database administrator or Organizations: No    Attends Engineer, structural: More than 4 times per year    Marital Status: Never married  Intimate Partner Violence: Not At Risk (10/26/2022)   Humiliation, Afraid, Rape, and Kick questionnaire    Fear of Current or Ex-Partner: No    Emotionally Abused: No    Physically Abused: No    Sexually Abused: No    Review of Systems:    Constitutional: No weight loss, fever or chills Cardiovascular: No chest pain Respiratory: No SOB  Gastrointestinal: See HPI and otherwise negative   Physical Exam:  Vital signs: BP 122/64   Pulse 90   Ht 5\' 9"  (1.753 m)   Wt 212 lb 6.4 oz (96.3 kg)   BMI 31.37 kg/m    Constitutional:   Pleasant AA male appears to be in NAD, Well developed, Well nourished, alert and cooperative Respiratory: Respirations even and unlabored. Lungs clear to auscultation bilaterally.   No wheezes, crackles, or rhonchi.  Cardiovascular: Normal S1, S2. No MRG. Regular rate and rhythm. No peripheral edema, cyanosis or pallor.  Gastrointestinal:  Soft, nondistended, nontender. No rebound or guarding. Normal bowel sounds. No appreciable masses or hepatomegaly. Rectal:  Not performed.  Psychiatric: Oriented to person, place and time. Demonstrates good judgement and reason without abnormal affect or behaviors.  RELEVANT LABS AND IMAGING: CBC    Component Value Date/Time   WBC 9.3 12/17/2022 0127   RBC 4.55 12/17/2022 0127   HGB 12.2 (L) 12/17/2022 0127   HGB 13.2 09/20/2022 1351   HCT 37.9 (L) 12/17/2022 0127   HCT 41.1 09/20/2022  1351   PLT 428 (  H) 12/17/2022 0127   PLT 443 09/20/2022 1351   MCV 83.3 12/17/2022 0127   MCV 83 09/20/2022 1351   MCH 26.8 12/17/2022 0127   MCHC 32.2 12/17/2022 0127   RDW 15.5 12/17/2022 0127   RDW 15.5 (H) 09/20/2022 1351   LYMPHSABS 2.7 09/20/2022 1351   MONOABS 1.4 (H) 11/17/2021 1115   EOSABS 0.2 09/20/2022 1351   BASOSABS 0.1 09/20/2022 1351    CMP     Component Value Date/Time   NA 139 04/06/2023 0839   K 4.4 04/06/2023 0839   CL 105 04/06/2023 0839   CO2 21 04/06/2023 0839   GLUCOSE 88 04/06/2023 0839   GLUCOSE 82 12/07/2022 1200   BUN 7 (L) 04/06/2023 0839   CREATININE 0.99 04/06/2023 0839   CREATININE 0.82 11/27/2014 1558   CALCIUM 10.0 04/06/2023 0839   PROT 7.8 04/06/2023 0839   ALBUMIN 4.0 04/06/2023 0839   AST 20 04/06/2023 0839   ALT 15 04/06/2023 0839   ALKPHOS 105 04/06/2023 0839   BILITOT 0.4 04/06/2023 0839   GFRNONAA >60 12/07/2022 1200   GFRAA 79 10/22/2019 1043    Assessment: 1.  GERD plus esophagitis: Controlled on Pantoprazole 40 mg daily 2.  Ulcerative colitis: Controlled on Mesalamine suppositories nightly  Plan: 1.  Continue Mesalamine suppositories and Pantoprazole 40 mg daily.  Patient tells me he does not need refills today but he could have refills for a year from today if needed. 2.  Patient to follow in clinic with [place] as needed.  Hyacinth Meeker, PA-C [facility] 08/17/2023, 10:52 AM  Cc: Hoy Register, MD

## 2023-08-17 NOTE — Progress Notes (Signed)
 Addendum: Reviewed and agree with assessment and management plan. Asha Grumbine, Carie Caddy, MD

## 2023-09-08 DIAGNOSIS — H40013 Open angle with borderline findings, low risk, bilateral: Secondary | ICD-10-CM | POA: Diagnosis not present

## 2023-09-08 DIAGNOSIS — H35361 Drusen (degenerative) of macula, right eye: Secondary | ICD-10-CM | POA: Diagnosis not present

## 2023-09-08 DIAGNOSIS — H31023 Solar retinopathy, bilateral: Secondary | ICD-10-CM | POA: Diagnosis not present

## 2023-09-08 DIAGNOSIS — H40052 Ocular hypertension, left eye: Secondary | ICD-10-CM | POA: Diagnosis not present

## 2023-10-03 ENCOUNTER — Other Ambulatory Visit: Payer: Self-pay | Admitting: Family Medicine

## 2023-10-03 DIAGNOSIS — I10 Essential (primary) hypertension: Secondary | ICD-10-CM

## 2023-10-03 DIAGNOSIS — Z9189 Other specified personal risk factors, not elsewhere classified: Secondary | ICD-10-CM

## 2023-10-04 ENCOUNTER — Ambulatory Visit: Payer: Medicare HMO | Attending: Family Medicine | Admitting: Family Medicine

## 2023-10-04 ENCOUNTER — Encounter: Payer: Self-pay | Admitting: Family Medicine

## 2023-10-04 VITALS — BP 138/73 | HR 77 | Ht 69.0 in | Wt 217.8 lb

## 2023-10-04 DIAGNOSIS — R7303 Prediabetes: Secondary | ICD-10-CM

## 2023-10-04 DIAGNOSIS — K5190 Ulcerative colitis, unspecified, without complications: Secondary | ICD-10-CM | POA: Diagnosis not present

## 2023-10-04 DIAGNOSIS — Z9189 Other specified personal risk factors, not elsewhere classified: Secondary | ICD-10-CM | POA: Diagnosis not present

## 2023-10-04 DIAGNOSIS — I10 Essential (primary) hypertension: Secondary | ICD-10-CM

## 2023-10-04 MED ORDER — ATORVASTATIN CALCIUM 20 MG PO TABS: 20.0000 mg | ORAL_TABLET | Freq: Every day | ORAL | 1 refills | Status: AC

## 2023-10-04 MED ORDER — AMLODIPINE BESYLATE 5 MG PO TABS: ORAL_TABLET | ORAL | 1 refills | Status: AC

## 2023-10-04 NOTE — Progress Notes (Signed)
 Subjective:  Patient ID: Robert Rhodes, male    DOB: September 10, 1945  Age: 78 y.o. MRN: [id number]  CC: Medical Management of Chronic Issues     Discussed the use of AI scribe software for clinical note transcription with the patient, who gave verbal consent to proceed.  History of Present Illness The patient, with a history of ulcerative colitis, hypertension, hyperlipidemia, and gastroesophageal reflux disease, presents for a routine follow-up. He reports no flare-ups of his ulcerative colitis and is continuing with his mesalamine . He also reports no concerns today. His blood pressure was initially elevated but normalized upon recheck. He is adhering to his amlodipine  for hypertension and atorvastatin  for hyperlipidemia. He continues to experience acid reflux but finds relief with daily pantoprazole .  The patient was found to have prediabetes on his last blood test in October, with an A1c of 5.7.  The patient also reports no issues with his knees and is able to exercise at [employer]. He has a phone visit scheduled for his annual Medicare wellness check.    Past Medical History:  Diagnosis Date   Adenomatous colon polyp    Arthritis    bilateral knees-needs replacements   Claustrophobia    Colon cancer (HCC) 2015   COVID-19    Dysrhythmia    Eye abnormalities    right eye seen in ED 02/17/2014 wearing eye patch using eye drops   GERD (gastroesophageal reflux disease)    with certain foods   Glaucoma    not on eye drops at this time (10/21/2020)   Hypertension    on meds   Irregular heartbeat    per pt/doesn't know what it does   Osteoarthritis    bilat knees  needs replacement   Osteoporosis    pt denies   Substance abuse (HCC)    stopped over 3 years ago   UC (ulcerative colitis) (HCC)    Vitamin D  deficiency     Past Surgical History:  Procedure Laterality Date   APPLICATION OF WOUND VAC  12/16/2022   Procedure: APPLICATION OF WOUND VAC;  Surgeon: Wes Hamman, MD;  Location: [facility];  Service: Orthopedics;;   COLON RESECTION N/A 08/23/2013   Procedure: Laparoscopic total abdominal colectomy and hernia repair;  Surgeon: Joyce Nixon, MD;  Location: [facility];  Service: General;  Laterality: N/A;   COLON SURGERY  08/2013   COLONOSCOPY  11/02/2020   EUS N/A 07/18/2013   Procedure: UPPER ENDOSCOPIC ULTRASOUND (EUS) RADIAL;  Surgeon: Janel Medford, MD;  Location: [facility];  Service: Endoscopy;  Laterality: N/A;   EYE SURGERY     FLEXIBLE SIGMOIDOSCOPY N/A 2020   prep good   HERNIA REPAIR  2005   In [place]/put in a mesh   INCISIONAL HERNIA REPAIR N/A 02/20/2014   Procedure: LAP ASSISTED INCISIONAL HERNIA REPAIR LYSIS OF ADHESIONS;  Surgeon: Joyce Nixon, MD;  Location: [facility];  Service: General;  Laterality: N/A;  converted to open @ 0935   INCISIONAL HERNIA REPAIR N/A 02/20/2014   Procedure: HERNIA REPAIR INCISIONAL;  Surgeon: Joyce Nixon, MD;  Location: [facility];  Service: General;  Laterality: N/A;  With MESH   INTRAOCULAR LENS INSERTION Bilateral    6 yrs ago   TOTAL KNEE ARTHROPLASTY Right 11/11/2021   Procedure: RIGHT TOTAL KNEE ARTHROPLASTY;  Surgeon: Wes Hamman, MD;  Location: [facility];  Service: Orthopedics;  Laterality: Right;   TOTAL KNEE ARTHROPLASTY Left 12/16/2022   Procedure: LEFT TOTAL KNEE ARTHROPLASTY;  Surgeon: Christiane Cowing,  Artie Bimler, MD;  Location: [facility];  Service: Orthopedics;  Laterality: Left;   UPPER GASTROINTESTINAL ENDOSCOPY     VENTRAL HERNIA REPAIR      Family History  Problem Relation Age of Onset   Cancer Mother        type unknown-in her leg   Diabetes Father    Heart disease Father    Heart disease Sister    Heart attack Brother    Healthy Sister    Colon cancer Neg Hx    Esophageal cancer Neg Hx    Rectal cancer Neg Hx    Stomach cancer Neg Hx    Colon polyps Neg Hx     Social History   Socioeconomic History   Marital status: Single    Spouse name: Not on file   Number of children: 0   Years of education:  Not on file   Highest education level: GED or equivalent  Occupational History   Occupation: retired  Tobacco Use   Smoking status: Former    Current packs/day: 0.00    Types: Cigarettes    Quit date: 06/14/2003    Years since quitting: 20.3   Smokeless tobacco: Never   Tobacco comments:    quit in July 2005  Vaping Use   Vaping status: Never Used  Substance and Sexual Activity   Alcohol  use: No   Drug use: Not Currently    Types: 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  Other Topics Concern   Not on file  Social History Narrative   Not on file   Social Drivers of Health   Financial Resource Strain: High Risk (10/03/2023)   Overall Financial Resource Strain (CARDIA)    Difficulty of Paying Living Expenses: Hard  Food Insecurity: Food Insecurity Present (10/03/2023)   Hunger Vital Sign    Worried About Running Out of Food in the Last Year: Often true    Ran Out of Food in the Last Year: Sometimes true  Transportation Needs: Unmet Transportation Needs (10/03/2023)   PRAPARE - Transportation    Lack of Transportation (Medical): No    Lack of Transportation (Non-Medical): Yes  Physical Activity: Sufficiently Active (10/03/2023)   Exercise Vital Sign    Days of Exercise per Week: 5 days    Minutes of Exercise per Session: 60 min  Stress: No Stress Concern Present (10/03/2023)   [employer] of Occupational Health - Occupational Stress Questionnaire    Feeling of Stress : Not at all  Social Connections: Moderately Isolated (10/03/2023)   Social Connection and Isolation Panel [NHANES]    Frequency of Communication with Friends and Family: Twice a week    Frequency of Social Gatherings with Friends and Family: Once a week    Attends Religious Services: Never    Database administrator or Organizations: No    Attends Engineer, structural: More than 4 times per year    Marital Status: Never married    Allergies  Allergen Reactions    Ace Inhibitors Other (See Comments)    Unknown reaction- "affecting my breathing in some kind of way"   Advil [Ibuprofen] Other (See Comments)    Muscle tightness   Aspirin  Nausea And Vomiting   Tylenol  [Acetaminophen ] Other (See Comments)    Muscle tightness    Outpatient Medications Prior to Visit  Medication Sig Dispense Refill   docusate sodium  (COLACE) 100 MG capsule Take 1 capsule (100 mg total) by  mouth daily as needed. 30 capsule 2   Homeopathic Products (ARNICA EX) Apply 1 Application topically daily as needed (bruising).     MAGNESIUM PO Take 1 tablet by mouth daily.     mesalamine  (CANASA ) 1000 MG suppository Place 1 suppository (1,000 mg total) rectally at bedtime. 30 suppository 12   pantoprazole  (PROTONIX ) 40 MG tablet Take 1 tablet (40 mg total) by mouth daily. 90 tablet 1   POTASSIUM PO Take 1 tablet by mouth daily.     VITAMIN D  PO Take 1 tablet by mouth 2 (two) times a week.     amLODipine  (NORVASC ) 5 MG tablet TAKE 1 TABLET BY MOUTH ONCE DAILY- HYPERTENSION 90 tablet 1   atorvastatin  (LIPITOR) 20 MG tablet Take 1 tablet (20 mg total) by mouth daily. 90 tablet 1   No facility-administered medications prior to visit.     ROS Review of Systems  Constitutional:  Negative for activity change and appetite change.  HENT:  Negative for sinus pressure and sore throat.   Respiratory:  Negative for chest tightness, shortness of breath and wheezing.   Cardiovascular:  Negative for chest pain and palpitations.  Gastrointestinal:  Negative for abdominal distention, abdominal pain and constipation.  Genitourinary: Negative.   Musculoskeletal: Negative.   Psychiatric/Behavioral:  Negative for behavioral problems and dysphoric mood.     Objective:  BP 138/73   Pulse 77   Ht 5\' 9"  (1.753 m)   Wt 217 lb 12.8 oz (98.8 kg)   SpO2 99%   BMI 32.16 kg/m      10/04/2023   10:55 AM 10/04/2023   10:44 AM 08/17/2023   11:00 AM  BP/Weight  Systolic BP 138 163 122  Diastolic  BP 73 75 64  Wt. (Lbs)  217.8 212.4  BMI  32.16 kg/m2 31.37 kg/m2      Physical Exam Constitutional:      Appearance: He is well-developed.  Cardiovascular:     Rate and Rhythm: Normal rate.     Heart sounds: Normal heart sounds. No murmur heard. Pulmonary:     Effort: Pulmonary effort is normal.     Breath sounds: Normal breath sounds. No wheezing or rales.  Chest:     Chest wall: No tenderness.  Abdominal:     General: Bowel sounds are normal. There is no distension.     Palpations: Abdomen is soft. There is no mass.     Tenderness: There is no abdominal tenderness.  Musculoskeletal:        General: Normal range of motion.     Right lower leg: No edema.     Left lower leg: No edema.  Neurological:     Mental Status: He is alert and oriented to person, place, and time.  Psychiatric:        Mood and Affect: Mood normal.        Latest Ref Rng & Units 04/06/2023    8:39 AM 12/07/2022   12:00 PM 09/20/2022    1:51 PM  CMP  Glucose 70 - 99 mg/dL 88  82  83   BUN 8 - 27 mg/dL 7  <5  6   Creatinine 1.61 - 1.27 mg/dL 0.96  0.45  4.09   Sodium 134 - 144 mmol/L 139  137  136   Potassium 3.5 - 5.2 mmol/L 4.4  3.7  4.5   Chloride 96 - 106 mmol/L 105  101  99   CO2 20 - 29 mmol/L 21  28  25  Calcium  8.6 - 10.2 mg/dL 16.1  9.0  9.5   Total Protein 6.0 - 8.5 g/dL 7.8   7.8   Total Bilirubin 0.0 - 1.2 mg/dL 0.4   0.3   Alkaline Phos 44 - 121 IU/L 105   88   AST 0 - 40 IU/L 20   30   ALT 0 - 44 IU/L 15   35     Lipid Panel     Component Value Date/Time   CHOL 183 04/06/2023 0839   TRIG 112 04/06/2023 0839   HDL 49 04/06/2023 0839   CHOLHDL 2.9 10/11/2021 1041   CHOLHDL 4.2 03/05/2013 1021   VLDL 28 03/05/2013 1021   LDLCALC 114 (H) 04/06/2023 0839    CBC    Component Value Date/Time   WBC 9.3 12/17/2022 0127   RBC 4.55 12/17/2022 0127   HGB 12.2 (L) 12/17/2022 0127   HGB 13.2 09/20/2022 1351   HCT 37.9 (L) 12/17/2022 0127   HCT 41.1 09/20/2022 1351   PLT 428  (H) 12/17/2022 0127   PLT 443 09/20/2022 1351   MCV 83.3 12/17/2022 0127   MCV 83 09/20/2022 1351   MCH 26.8 12/17/2022 0127   MCHC 32.2 12/17/2022 0127   RDW 15.5 12/17/2022 0127   RDW 15.5 (H) 09/20/2022 1351   LYMPHSABS 2.7 09/20/2022 1351   MONOABS 1.4 (H) 11/17/2021 1115   EOSABS 0.2 09/20/2022 1351   BASOSABS 0.1 09/20/2022 1351    Lab Results  Component Value Date   HGBA1C 5.7 (H) 11/28/2022    The 10-year ASCVD risk score (Arnett DK, et al., 2019) is: 27.5%   Values used to calculate the score:     Age: 85 years     Sex: Male     Is Non-Hispanic African American: Yes     Diabetic: No     Tobacco smoker: No     Systolic Blood Pressure: 138 mmHg     Is BP treated: Yes     HDL Cholesterol: 49 mg/dL     Total Cholesterol: 183 mg/dL     Assessment & Plan  Hypertension Blood pressure initially elevated but improved upon recheck. Continues on amlodipine . - Continue amlodipine  as prescribed. -Counseled on blood pressure goal of less than 130/80, low-sodium, DASH diet, medication compliance, 150 minutes of moderate intensity exercise per week. Discussed medication compliance, adverse effects.  At risk for cardiovascular disease Continues on atorvastatin . Cholesterol levels not checked today due to recent breakfast intake. - Advise fasting before next cholesterol check. - Plan to check cholesterol levels at next visit.  Prediabetes Previously identified prediabetes with A1c of 5.7%. Discussed lifestyle modifications to prevent progression to diabetes. - Order repeat A1c test to monitor glucose control. - Advise on lifestyle modifications including reducing sweets and carbohydrates, exercising, and weight loss.  Gastroesophageal reflux disease (GERD) Experiences acid reflux but reports improvement with pantoprazole . - Continue pantoprazole  as prescribed.  Ulcerative colitis No recent flare-ups. Continues management with mesalamine  as per GI specialist's  recommendations. - Continue mesalamine  as prescribed.      Meds ordered this encounter  Medications   amLODipine  (NORVASC ) 5 MG tablet    Sig: TAKE 1 TABLET BY MOUTH ONCE DAILY- HYPERTENSION    Dispense:  90 tablet    Refill:  1   atorvastatin  (LIPITOR) 20 MG tablet    Sig: Take 1 tablet (20 mg total) by mouth daily.    Dispense:  90 tablet    Refill:  1    Follow-up:  Return in about 6 months (around 04/04/2024) for Chronic medical conditions.       Joaquin Mulberry, MD, FAAFP. [facility] [place], [place] [phone]   10/04/2023, 11:09 AM

## 2023-10-04 NOTE — Patient Instructions (Signed)
 VISIT SUMMARY:  You came in today for a routine follow-up visit. You reported no flare-ups of your ulcerative colitis and no other concerns. Your blood pressure was initially high but normalized upon recheck. You are continuing your current medications for hypertension, hyperlipidemia, and acid reflux. We also discussed your prediabetes and the importance of lifestyle changes to prevent progression to diabetes.  YOUR PLAN:  -WELLNESS VISIT: This was a routine wellness visit with no acute concerns. It is important to have regular follow-ups to monitor your chronic conditions. Please schedule your next wellness visit in six months and call to schedule an appointment if any issues arise before then.  -HYPERTENSION: Hypertension means high blood pressure. Your blood pressure was initially high but improved upon recheck. Continue taking amlodipine  as prescribed.  -HYPERLIPIDEMIA: Hyperlipidemia means high cholesterol levels. You are continuing atorvastatin  for this condition. Please fast before your next cholesterol check, which we will do at your next visit.  -PREDIABETES: Prediabetes means your blood sugar levels are higher than normal but not high enough to be classified as diabetes. We discussed lifestyle changes to prevent progression to diabetes, such as reducing sweets and carbohydrates, exercising, and losing weight. We will order a repeat A1c test to monitor your glucose control.  -GASTROESOPHAGEAL REFLUX DISEASE (GERD): GERD is a condition where stomach acid frequently flows back into the tube connecting your mouth and stomach, causing acid reflux. You are experiencing improvement with pantoprazole , so continue taking it as prescribed.  -ULCERATIVE COLITIS: Ulcerative colitis is a chronic condition that causes inflammation and sores in the digestive tract. You have not had any recent flare-ups and are managing it with mesalamine  as recommended by your GI specialist. Continue taking mesalamine  as  prescribed.  INSTRUCTIONS:  Please schedule your next wellness visit in six months. Make sure to fast before your next cholesterol check. We will also order a repeat A1c test to monitor your glucose control. Call and schedule an appointment if any issues arise before your next scheduled visit.

## 2023-10-05 ENCOUNTER — Encounter: Payer: Self-pay | Admitting: Family Medicine

## 2023-10-05 LAB — CMP14+EGFR
ALT: 15 IU/L (ref 0–44)
AST: 21 IU/L (ref 0–40)
Albumin: 4 g/dL (ref 3.8–4.8)
Alkaline Phosphatase: 97 IU/L (ref 44–121)
BUN/Creatinine Ratio: 6 — ABNORMAL LOW (ref 10–24)
BUN: 6 mg/dL — ABNORMAL LOW (ref 8–27)
Bilirubin Total: 0.3 mg/dL (ref 0.0–1.2)
CO2: 21 mmol/L (ref 20–29)
Calcium: 9.4 mg/dL (ref 8.6–10.2)
Chloride: 103 mmol/L (ref 96–106)
Creatinine, Ser: 1.01 mg/dL (ref 0.76–1.27)
Globulin, Total: 3.5 g/dL (ref 1.5–4.5)
Glucose: 80 mg/dL (ref 70–99)
Potassium: 4.6 mmol/L (ref 3.5–5.2)
Sodium: 137 mmol/L (ref 134–144)
Total Protein: 7.5 g/dL (ref 6.0–8.5)
eGFR: 76 mL/min/{1.73_m2} (ref >59)

## 2023-10-05 LAB — HEMOGLOBIN A1C
Est. average glucose Bld gHb Est-mCnc: 108 mg/dL
Hgb A1c MFr Bld: 5.4 % (ref 4.8–5.6)

## 2023-10-31 ENCOUNTER — Ambulatory Visit: Payer: Self-pay | Attending: Family Medicine

## 2023-10-31 VITALS — Wt 209.0 lb

## 2023-10-31 DIAGNOSIS — Z0000 Encounter for general adult medical examination without abnormal findings: Secondary | ICD-10-CM | POA: Diagnosis not present

## 2023-10-31 NOTE — Progress Notes (Addendum)
 Because this visit was a virtual/telehealth visit,  certain criteria was not obtained, such a blood pressure, CBG if applicable, and timed get up and go. Any medications not marked as "taking" were not mentioned during the medication reconciliation part of the visit. Any vitals not documented were not able to be obtained due to this being a telehealth visit or patient was unable to self-report a recent blood pressure reading due to a lack of equipment at home via telehealth. Vitals that have been documented are verbally provided by the patient.   Subjective:   Robert Rhodes is a 78 y.o. who presents for a Medicare Wellness preventive visit.  As a reminder, Annual Wellness Visits don't include a physical exam, and some assessments may be limited, especially if this visit is performed virtually. We may recommend an in-person follow-up visit with your provider if needed.  Visit Complete: Virtual I connected with  Robert Rhodes on 10/31/23 by a audio enabled telemedicine application and verified that I am speaking with the correct person using two identifiers.  Patient Location: Home  Provider Location: Office/Clinic  I discussed the limitations of evaluation and management by telemedicine. The patient expressed understanding and agreed to proceed.  Vital Signs: Because this visit was a virtual/telehealth visit, some criteria may be missing or patient reported. Any vitals not documented were not able to be obtained and vitals that have been documented are patient reported.  VideoDeclined- This patient declined Librarian, academic. Therefore the visit was completed with audio only.  Persons Participating in Visit: Patient.  AWV Questionnaire: No: Patient Medicare AWV questionnaire was not completed prior to this visit.  Cardiac Risk Factors include: advanced age (>68men, >69 women);family history of premature cardiovascular disease;male  gender;hypertension;obesity (BMI >30kg/m2)     Objective:     Today's Vitals   10/31/23 1456  Weight: 209 lb (94.8 kg)  PainSc: 0-No pain   Body mass index is 30.86 kg/m.     10/31/2023    2:58 PM 01/05/2023   11:50 AM 12/07/2022   11:17 AM 10/26/2022    9:21 PM 12/02/2021    1:54 PM 11/30/2021   10:10 AM 11/23/2021    1:57 PM  Advanced Directives  Does Patient Have a Medical Advance Directive? No No No No No No No  Would patient like information on creating a medical advance directive? No - Patient declined No - Patient declined No - Patient declined Yes (MAU/Ambulatory/Procedural Areas - Information given)  Yes (ED - Information included in AVS) No - Patient declined    Current Medications (verified) Outpatient Encounter Medications as of 10/31/2023  Medication Sig   amLODipine  (NORVASC ) 5 MG tablet TAKE 1 TABLET BY MOUTH ONCE DAILY- HYPERTENSION   atorvastatin  (LIPITOR) 20 MG tablet Take 1 tablet (20 mg total) by mouth daily.   docusate sodium  (COLACE) 100 MG capsule Take 1 capsule (100 mg total) by mouth daily as needed.   Homeopathic Products (ARNICA EX) Apply 1 Application topically daily as needed (bruising).   MAGNESIUM PO Take 1 tablet by mouth daily.   mesalamine  (CANASA ) 1000 MG suppository Place 1 suppository (1,000 mg total) rectally at bedtime.   pantoprazole  (PROTONIX ) 40 MG tablet Take 1 tablet (40 mg total) by mouth daily.   POTASSIUM PO Take 1 tablet by mouth daily.   VITAMIN D  PO Take 1 tablet by mouth 2 (two) times a week.   No facility-administered encounter medications on file as of 10/31/2023.    Allergies (  verified) Ace inhibitors, Advil [ibuprofen], Aspirin , and Tylenol  [acetaminophen ]   History: Past Medical History:  Diagnosis Date   Adenomatous colon polyp    Arthritis    bilateral knees-needs replacements   Claustrophobia    Colon cancer (HCC) 2015   COVID-19    Dysrhythmia    Eye abnormalities    right eye seen in ED 02/17/2014 wearing eye  patch using eye drops   GERD (gastroesophageal reflux disease)    with certain foods   Glaucoma    not on eye drops at this time (10/21/2020)   Hypertension    on meds   Irregular heartbeat    per pt/doesn't know what it does   Osteoarthritis    bilat knees  needs replacement   Osteoporosis    pt denies   Substance abuse (HCC)    stopped over 3 years ago   UC (ulcerative colitis) (HCC)    Vitamin D  deficiency    Past Surgical History:  Procedure Laterality Date   APPLICATION OF WOUND VAC  12/16/2022   Procedure: APPLICATION OF WOUND VAC;  Surgeon: Wes Hamman, MD;  Location: [facility];  Service: Orthopedics;;   COLON RESECTION N/A 08/23/2013   Procedure: Laparoscopic total abdominal colectomy and hernia repair;  Surgeon: Joyce Nixon, MD;  Location: [facility];  Service: General;  Laterality: N/A;   COLON SURGERY  08/2013   COLONOSCOPY  11/02/2020   EUS N/A 07/18/2013   Procedure: UPPER ENDOSCOPIC ULTRASOUND (EUS) RADIAL;  Surgeon: Janel Medford, MD;  Location: [facility];  Service: Endoscopy;  Laterality: N/A;   EYE SURGERY     FLEXIBLE SIGMOIDOSCOPY N/A 2020   prep good   HERNIA REPAIR  2005   In [place]/put in a mesh   INCISIONAL HERNIA REPAIR N/A 02/20/2014   Procedure: LAP ASSISTED INCISIONAL HERNIA REPAIR LYSIS OF ADHESIONS;  Surgeon: Joyce Nixon, MD;  Location: [facility];  Service: General;  Laterality: N/A;  converted to open @ 0935   INCISIONAL HERNIA REPAIR N/A 02/20/2014   Procedure: HERNIA REPAIR INCISIONAL;  Surgeon: Joyce Nixon, MD;  Location: [facility];  Service: General;  Laterality: N/A;  With MESH   INTRAOCULAR LENS INSERTION Bilateral    6 yrs ago   TOTAL KNEE ARTHROPLASTY Right 11/11/2021   Procedure: RIGHT TOTAL KNEE ARTHROPLASTY;  Surgeon: Wes Hamman, MD;  Location: [facility];  Service: Orthopedics;  Laterality: Right;   TOTAL KNEE ARTHROPLASTY Left 12/16/2022   Procedure: LEFT TOTAL KNEE ARTHROPLASTY;  Surgeon: Wes Hamman, MD;  Location: [facility];  Service:  Orthopedics;  Laterality: Left;   UPPER GASTROINTESTINAL ENDOSCOPY     VENTRAL HERNIA REPAIR     Family History  Problem Relation Age of Onset   Cancer Mother        type unknown-in her leg   Diabetes Father    Heart disease Father    Heart disease Sister    Heart attack Brother    Healthy Sister    Colon cancer Neg Hx    Esophageal cancer Neg Hx    Rectal cancer Neg Hx    Stomach cancer Neg Hx    Colon polyps Neg Hx    Social History   Socioeconomic History   Marital status: Single    Spouse name: Not on file   Number of children: 0   Years of education: Not on file   Highest education level: GED or equivalent  Occupational History   Occupation: retired  Tobacco Use  Smoking status: Former    Current packs/day: 0.00    Types: Cigarettes    Quit date: 06/14/2003    Years since quitting: 20.3   Smokeless tobacco: Never   Tobacco comments:    quit in July 2005  Vaping Use   Vaping status: Never Used  Substance and Sexual Activity   Alcohol  use: No   Drug use: Not Currently    Types: 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  Other Topics Concern   Not on file  Social History Narrative   Not on file   Social Drivers of Health   Financial Resource Strain: High Risk (10/31/2023)   Overall Financial Resource Strain (CARDIA)    Difficulty of Paying Living Expenses: Hard  Food Insecurity: Food Insecurity Present (10/31/2023)   Hunger Vital Sign    Worried About Running Out of Food in the Last Year: Often true    Ran Out of Food in the Last Year: Sometimes true  Transportation Needs: Unmet Transportation Needs (10/31/2023)   PRAPARE - Transportation    Lack of Transportation (Medical): No    Lack of Transportation (Non-Medical): Yes  Physical Activity: Sufficiently Active (10/31/2023)   Exercise Vital Sign    Days of Exercise per Week: 5 days    Minutes of Exercise per Session: 60 min  Stress: No Stress Concern Present (10/31/2023)    [employer] of Occupational Health - Occupational Stress Questionnaire    Feeling of Stress : Not at all  Social Connections: Socially Isolated (10/31/2023)   Social Connection and Isolation Panel [NHANES]    Frequency of Communication with Friends and Family: Twice a week    Frequency of Social Gatherings with Friends and Family: Once a week    Attends Religious Services: Never    Database administrator or Organizations: No    Attends Engineer, structural: Not on file    Marital Status: Never married    Tobacco Counseling Counseling given: Not Answered Tobacco comments: quit in July 2005    Clinical Intake:  Pre-visit preparation completed: Yes  Pain : No/denies pain Pain Score: 0-No pain     BMI - recorded: 30.86 Nutritional Status: BMI > 30  Obese Nutritional Risks: None Diabetes: No  Lab Results  Component Value Date   HGBA1C 5.4 10/04/2023   HGBA1C 5.7 (H) 11/28/2022   HGBA1C 5.5 10/22/2019     How often do you need to have someone help you when you read instructions, pamphlets, or other written materials from your doctor or pharmacy?: 1 - Never  Interpreter Needed?: No  Information entered by :: Annaston Upham N. Emylee Decelle, LPN.   Activities of Daily Living     10/31/2023    2:59 PM 12/07/2022   11:20 AM  In your present state of health, do you have any difficulty performing the following activities:  Hearing? 0   Vision? 0   Difficulty concentrating or making decisions? 0   Walking or climbing stairs? 1   Comment CANE   Dressing or bathing? 0   Doing errands, shopping? 0 0  Preparing Food and eating ? N   Using the Toilet? N   In the past six months, have you accidently leaked urine? Y   Comment LIGHT LEAKAGE   Do you have problems with loss of bowel control? N   Managing your Medications? N   Managing your Finances? N   Housekeeping or managing your Housekeeping? N  Patient Care Team: Joaquin Mulberry, MD as PCP - General (Family  Medicine) Sumner Ends, MD as Consulting Physician (Oncology) Wes Hamman, MD as Attending Physician (Orthopedic Surgery) Amedeo Jupiter, MD as Consulting Physician (Ophthalmology) Frazier, [place], OD as Consulting Physician (Optometry)  Indicate any recent Medical Services you may have received from other than [facility] providers in the past year (date may be approximate).     Assessment:    This is a routine wellness examination for Badr.  Hearing/Vision screen Hearing Screening - Comments:: Denies hearing difficulties.  Vision Screening - Comments:: Wears rx glasses - up to date with routine eye exams with Kathyrn Hecker, MD.    Goals Addressed               This Visit's Progress     Patient Stated (pt-stated)        10/31/2023: Continue to get the right balance of food and what would benefit my body to live longer.       Depression Screen     10/31/2023    2:59 PM 10/04/2023   10:46 AM 01/02/2023   10:55 AM 10/26/2022    9:19 PM 09/20/2022   10:53 AM 06/02/2022   10:50 AM 03/21/2022    8:40 AM  PHQ 2/9 Scores  PHQ - 2 Score 0 0 0 0 0 0 0  PHQ- 9 Score 0 0 0  0 2     Fall Risk     10/31/2023    2:59 PM 10/04/2023   10:46 AM 04/05/2023   10:33 AM 01/02/2023   10:55 AM 10/26/2022    9:16 PM  Fall Risk   Falls in the past year? 0 0 0 0 0  Number falls in past yr: 0 0 0 0 0  Injury with Fall? 0 0 0 0 0  Risk for fall due to : No Fall Risks No Fall Risks No Fall Risks No Fall Risks No Fall Risks  Follow up Falls evaluation completed Falls evaluation completed Falls evaluation completed  Falls prevention discussed;Education provided;Falls evaluation completed    MEDICARE RISK AT HOME:  Medicare Risk at Home Any stairs in or around the home?: No If so, are there any without handrails?: No Home free of loose throw rugs in walkways, pet beds, electrical cords, etc?: Yes Adequate lighting in your home to reduce risk of falls?: Yes Life alert?: No Use of a cane,  walker or w/c?: Yes Grab bars in the bathroom?: Yes Shower chair or bench in shower?: No Elevated toilet seat or a handicapped toilet?: No  TIMED UP AND GO:  Was the test performed?  No  Cognitive Function: 6CIT completed    10/31/2023    3:04 PM 11/27/2019    2:45 PM  MMSE - Mini Mental State Exam  Not completed: Unable to complete   Orientation to time  5  Orientation to Place  5  Registration  3  Attention/ Calculation  5  Recall  3  Language- name 2 objects  2  Language- repeat  1  Language- follow 3 step command  3  Language- read & follow direction  1  Write a sentence  1  Copy design  1  Total score  30        10/31/2023    3:05 PM 10/26/2022    9:22 PM 09/20/2022   10:52 AM  6CIT Screen  What Year? 0 points 0 points 0 points  What month? 0 points 0 points  0 points  What time? 0 points 0 points 0 points  Count back from 20 0 points 0 points 0 points  Months in reverse 0 points 0 points 0 points  Repeat phrase 0 points 0 points 0 points  Total Score 0 points 0 points 0 points    Immunizations Immunization History  Administered Date(s) Administered   Fluad Quad(high Dose 65+) 02/12/2023   Influenza, High Dose Seasonal PF 03/15/2019   Influenza-Unspecified 03/06/2022   PFIZER(Purple Top)SARS-COV-2 Vaccination 07/18/2019, 08/08/2019, 03/07/2020, 10/08/2020, 03/02/2021   PNEUMOCOCCAL CONJUGATE-20 09/16/2021   PPD Test 02/25/2014   Pfizer(Comirnaty)Fall Seasonal Vaccine 12 years and older 03/26/2022, 04/12/2023, 10/16/2023   Pneumococcal Conjugate-13 05/20/2019, 11/27/2019   RSV,unspecified 02/26/2022   Td 02/09/2023   Tdap 11/27/2019   Zoster Recombinant(Shingrix) 05/20/2019, 11/28/2019    Screening Tests Health Maintenance  Topic Date Due   OPHTHALMOLOGY EXAM  04/16/2023   COVID-19 Vaccine (9 - 2024-25 season) 12/11/2023   INFLUENZA VACCINE  01/12/2024   Medicare Annual Wellness (AWV)  10/30/2024   DTaP/Tdap/Td (3 - Td or Tdap) 02/08/2033   Pneumonia  Vaccine 5+ Years old  Completed   Zoster Vaccines- Shingrix  Completed   HPV VACCINES  Aged Out   Meningococcal B Vaccine  Aged Out   Colonoscopy  Discontinued   Hepatitis C Screening  Discontinued    Health Maintenance  Health Maintenance Due  Topic Date Due   OPHTHALMOLOGY EXAM  04/16/2023   Health Maintenance Items Addressed: Yes, Patient aware of current care gaps.  Immunization record was verified by [facility] and updated in patient's chart. See Nurse Notes  Additional Screening:  Vision Screening: Recommended annual ophthalmology exams for early detection of glaucoma and other disorders of the eye.  Dental Screening: Recommended annual dental exams for proper oral hygiene  Community Resource Referral / Chronic Care Management: CRR required this visit?  No   CCM required this visit?  No   Plan:    I have personally reviewed and noted the following in the patient's chart:   Medical and social history Use of alcohol , tobacco or illicit drugs  Current medications and supplements including opioid prescriptions. Patient is not currently taking opioid prescriptions. Functional ability and status Nutritional status Physical activity Advanced directives List of other physicians Hospitalizations, surgeries, and ER visits in previous 12 months Vitals Screenings to include cognitive, depression, and falls Referrals and appointments  In addition, I have reviewed and discussed with patient certain preventive protocols, quality metrics, and best practice recommendations. A written personalized care plan for preventive services as well as general preventive health recommendations were provided to patient.   Margette Sheldon, LPN   6/96/2952   After Visit Summary: (MyChart) Due to this being a telephonic visit, the after visit summary with patients personalized plan was offered to patient via MyChart   Notes: Patient aware of current care gaps.  Immunization record was  verified by [facility] and updated in patient's chart.

## 2023-10-31 NOTE — Patient Instructions (Signed)
 Robert Rhodes , Thank you for taking time out of your busy schedule to complete your Annual Wellness Visit with me. I enjoyed our conversation and look forward to speaking with you again next year. I, as well as your care team,  appreciate your ongoing commitment to your health goals. Please review the following plan we discussed and let me know if I can assist you in the future. Your Game plan/ To Do List    Referrals: If you haven't heard from the office you've been referred to, please reach out to them at the phone provided.  Please call Dr. Amedeo Jupiter to schedule your next eye exam  Follow up Visits: Next Medicare AWV with our clinical staff: 11/05/2024 at 2:50p Phone Visit with Nurse Health Advisor   Have you seen your provider in the last 6 months (3 months if uncontrolled diabetes)? Yes Next Office Visit with your provider: 04/04/2024 at 9:10 am OFFICE Visit with Dr. Adan Holms  Clinician Recommendations:  Aim for 30 minutes of exercise or brisk walking, 6-8 glasses of water, and 5 servings of fruits and vegetables each day.       This is a list of the screening recommended for you and due dates:  Health Maintenance  Topic Date Due   Eye exam for diabetics  04/16/2023   COVID-19 Vaccine (9 - 2024-25 season) 12/11/2023   Flu Shot  01/12/2024   Medicare Annual Wellness Visit  10/30/2024   DTaP/Tdap/Td vaccine (3 - Td or Tdap) 02/08/2033   Pneumonia Vaccine  Completed   Zoster (Shingles) Vaccine  Completed   HPV Vaccine  Aged Out   Meningitis B Vaccine  Aged Out   Colon Cancer Screening  Discontinued   Hepatitis C Screening  Discontinued    Advanced directives: (Declined) Advance directive discussed with you today. Even though you declined this today, please call our office should you change your mind, and we can give you the proper paperwork for you to fill out. Advance Care Planning is important because it:  [x]  Makes sure you receive the medical care that is consistent with your  values, goals, and preferences  [x]  It provides guidance to your family and loved ones and reduces their decisional burden about whether or not they are making the right decisions based on your wishes.  Follow the link provided in your after visit summary or read over the paperwork we have mailed to you to help you started getting your Advance Directives in place. If you need assistance in completing these, please reach out to us  so that we can help you!  See attachments for Preventive Care and Fall Prevention Tips.

## 2023-12-05 ENCOUNTER — Telehealth: Payer: Self-pay | Admitting: Physician Assistant

## 2023-12-05 DIAGNOSIS — K209 Esophagitis, unspecified without bleeding: Secondary | ICD-10-CM

## 2023-12-05 MED ORDER — PANTOPRAZOLE SODIUM 40 MG PO TBEC: 40.0000 mg | DELAYED_RELEASE_TABLET | Freq: Every day | ORAL | 1 refills | Status: AC

## 2023-12-05 NOTE — Telephone Encounter (Signed)
 PT is calling to have a refill for pantoprazole  sent to the [facility] [address].

## 2023-12-05 NOTE — Telephone Encounter (Signed)
 Refill sent to pharmacy.

## 2023-12-14 DIAGNOSIS — H40013 Open angle with borderline findings, low risk, bilateral: Secondary | ICD-10-CM | POA: Diagnosis not present

## 2023-12-14 DIAGNOSIS — H40052 Ocular hypertension, left eye: Secondary | ICD-10-CM | POA: Diagnosis not present

## 2024-04-03 ENCOUNTER — Telehealth: Payer: Self-pay | Admitting: Family Medicine

## 2024-04-03 DIAGNOSIS — F1421 Cocaine dependence, in remission: Secondary | ICD-10-CM | POA: Diagnosis not present

## 2024-04-03 DIAGNOSIS — K219 Gastro-esophageal reflux disease without esophagitis: Secondary | ICD-10-CM | POA: Diagnosis not present

## 2024-04-03 DIAGNOSIS — K5190 Ulcerative colitis, unspecified, without complications: Secondary | ICD-10-CM | POA: Diagnosis not present

## 2024-04-03 DIAGNOSIS — E669 Obesity, unspecified: Secondary | ICD-10-CM | POA: Diagnosis not present

## 2024-04-03 DIAGNOSIS — E785 Hyperlipidemia, unspecified: Secondary | ICD-10-CM | POA: Diagnosis not present

## 2024-04-03 DIAGNOSIS — L309 Dermatitis, unspecified: Secondary | ICD-10-CM | POA: Diagnosis not present

## 2024-04-03 DIAGNOSIS — I10 Essential (primary) hypertension: Secondary | ICD-10-CM | POA: Diagnosis not present

## 2024-04-03 DIAGNOSIS — Z833 Family history of diabetes mellitus: Secondary | ICD-10-CM | POA: Diagnosis not present

## 2024-04-03 DIAGNOSIS — Z8249 Family history of ischemic heart disease and other diseases of the circulatory system: Secondary | ICD-10-CM | POA: Diagnosis not present

## 2024-04-03 DIAGNOSIS — Z9989 Dependence on other enabling machines and devices: Secondary | ICD-10-CM | POA: Diagnosis not present

## 2024-04-03 DIAGNOSIS — F1211 Cannabis abuse, in remission: Secondary | ICD-10-CM | POA: Diagnosis not present

## 2024-04-03 DIAGNOSIS — F1121 Opioid dependence, in remission: Secondary | ICD-10-CM | POA: Diagnosis not present

## 2024-04-03 NOTE — Telephone Encounter (Signed)
 Pt confirmed appt (per vr)

## 2024-04-04 ENCOUNTER — Ambulatory Visit: Admitting: Family Medicine

## 2024-04-15 ENCOUNTER — Encounter: Payer: Self-pay | Admitting: Radiology

## 2024-11-05 ENCOUNTER — Ambulatory Visit
